# Patient Record
Sex: Female | Born: 1935 | Race: White | Hispanic: No | Marital: Married | Smoking: Never smoker
Health system: Southern US, Community
[De-identification: ages and names within clinical notes are randomized; demographics above are authoritative.]

## PROBLEM LIST (undated history)

## (undated) ENCOUNTER — Emergency Department: Admission: EM | Source: Home / Self Care

## (undated) DIAGNOSIS — H9190 Unspecified hearing loss, unspecified ear: Secondary | ICD-10-CM

## (undated) DIAGNOSIS — C419 Malignant neoplasm of bone and articular cartilage, unspecified: Secondary | ICD-10-CM

## (undated) DIAGNOSIS — Z8719 Personal history of other diseases of the digestive system: Secondary | ICD-10-CM

## (undated) DIAGNOSIS — M1990 Unspecified osteoarthritis, unspecified site: Secondary | ICD-10-CM

## (undated) DIAGNOSIS — I639 Cerebral infarction, unspecified: Secondary | ICD-10-CM

## (undated) DIAGNOSIS — R52 Pain, unspecified: Secondary | ICD-10-CM

## (undated) DIAGNOSIS — I82402 Acute embolism and thrombosis of unspecified deep veins of left lower extremity: Secondary | ICD-10-CM

## (undated) DIAGNOSIS — N189 Chronic kidney disease, unspecified: Secondary | ICD-10-CM

## (undated) DIAGNOSIS — Z8619 Personal history of other infectious and parasitic diseases: Secondary | ICD-10-CM

## (undated) DIAGNOSIS — K259 Gastric ulcer, unspecified as acute or chronic, without hemorrhage or perforation: Secondary | ICD-10-CM

## (undated) DIAGNOSIS — Z860100 Personal history of colon polyps, unspecified: Secondary | ICD-10-CM

## (undated) DIAGNOSIS — I898 Other specified noninfective disorders of lymphatic vessels and lymph nodes: Secondary | ICD-10-CM

## (undated) DIAGNOSIS — Z923 Personal history of irradiation: Secondary | ICD-10-CM

## (undated) DIAGNOSIS — R4701 Aphasia: Secondary | ICD-10-CM

## (undated) DIAGNOSIS — C541 Malignant neoplasm of endometrium: Secondary | ICD-10-CM

## (undated) DIAGNOSIS — R066 Hiccough: Secondary | ICD-10-CM

## (undated) DIAGNOSIS — I2699 Other pulmonary embolism without acute cor pulmonale: Secondary | ICD-10-CM

## (undated) DIAGNOSIS — C50919 Malignant neoplasm of unspecified site of unspecified female breast: Secondary | ICD-10-CM

## (undated) DIAGNOSIS — Z86718 Personal history of other venous thrombosis and embolism: Secondary | ICD-10-CM

## (undated) DIAGNOSIS — K219 Gastro-esophageal reflux disease without esophagitis: Secondary | ICD-10-CM

## (undated) HISTORY — DX: Unspecified hearing loss, unspecified ear: H91.90

## (undated) HISTORY — DX: Personal history of colon polyps, unspecified: Z86.0100

## (undated) HISTORY — DX: Personal history of other infectious and parasitic diseases: Z86.19

## (undated) HISTORY — PX: EYE SURGERY: SHX253

## (undated) HISTORY — PX: CATARACT EXTRACTION W/ INTRAOCULAR LENS IMPLANT: SHX1309

## (undated) HISTORY — DX: Other specified noninfective disorders of lymphatic vessels and lymph nodes: I89.8

## (undated) HISTORY — PX: TOTAL ABDOMINAL HYSTERECTOMY W/ BILATERAL SALPINGOOPHORECTOMY: SHX83

## (undated) HISTORY — PX: THYROIDECTOMY, PARTIAL: SHX18

## (undated) HISTORY — DX: Malignant neoplasm of endometrium: C54.1

## (undated) HISTORY — DX: Personal history of irradiation: Z92.3

## (undated) HISTORY — PX: ESOPHAGEAL DILATION: SHX303

## (undated) HISTORY — DX: Personal history of other venous thrombosis and embolism: Z86.718

## (undated) HISTORY — PX: OTHER SURGICAL HISTORY: SHX169

## (undated) HISTORY — DX: Personal history of colonic polyps: Z86.010

## (undated) HISTORY — DX: Aphasia: R47.01

## (undated) HISTORY — PX: BACK SURGERY: SHX140

## (undated) HISTORY — PX: TONSILLECTOMY: SUR1361

---

## 2004-05-24 ENCOUNTER — Ambulatory Visit: Payer: Self-pay | Admitting: Internal Medicine

## 2004-06-09 ENCOUNTER — Ambulatory Visit: Payer: Self-pay | Admitting: Gastroenterology

## 2004-06-15 ENCOUNTER — Ambulatory Visit: Payer: Self-pay | Admitting: Gastroenterology

## 2005-03-06 ENCOUNTER — Ambulatory Visit: Payer: Self-pay | Admitting: Internal Medicine

## 2005-05-29 ENCOUNTER — Ambulatory Visit: Payer: Self-pay | Admitting: Internal Medicine

## 2006-05-31 ENCOUNTER — Ambulatory Visit: Payer: Self-pay | Admitting: Internal Medicine

## 2007-05-06 ENCOUNTER — Emergency Department: Payer: Self-pay | Admitting: Emergency Medicine

## 2007-06-04 ENCOUNTER — Ambulatory Visit: Payer: Self-pay | Admitting: Internal Medicine

## 2008-04-09 ENCOUNTER — Ambulatory Visit: Payer: Self-pay | Admitting: Internal Medicine

## 2008-06-08 ENCOUNTER — Ambulatory Visit: Payer: Self-pay | Admitting: Internal Medicine

## 2008-06-29 ENCOUNTER — Ambulatory Visit: Payer: Self-pay | Admitting: Gastroenterology

## 2008-09-14 ENCOUNTER — Ambulatory Visit: Payer: Self-pay | Admitting: Gastroenterology

## 2008-12-13 ENCOUNTER — Emergency Department: Payer: Self-pay | Admitting: Unknown Physician Specialty

## 2009-04-27 ENCOUNTER — Ambulatory Visit: Payer: Self-pay | Admitting: Ophthalmology

## 2009-07-13 ENCOUNTER — Ambulatory Visit: Payer: Self-pay | Admitting: Internal Medicine

## 2009-10-26 ENCOUNTER — Ambulatory Visit: Payer: Self-pay | Admitting: Internal Medicine

## 2009-11-01 ENCOUNTER — Ambulatory Visit: Payer: Self-pay | Admitting: Internal Medicine

## 2010-06-15 ENCOUNTER — Ambulatory Visit: Payer: Self-pay | Admitting: Internal Medicine

## 2010-07-27 ENCOUNTER — Ambulatory Visit: Payer: Self-pay | Admitting: Internal Medicine

## 2010-09-18 ENCOUNTER — Emergency Department: Payer: Self-pay | Admitting: Unknown Physician Specialty

## 2011-04-06 ENCOUNTER — Inpatient Hospital Stay (HOSPITAL_COMMUNITY)
Admission: EM | Admit: 2011-04-06 | Discharge: 2011-04-08 | DRG: 066 | Disposition: A | Discharge status: Home Health | Payer: Medicare Other | Source: Ambulatory Visit | Attending: Family Medicine | Admitting: Family Medicine

## 2011-04-06 ENCOUNTER — Encounter (HOSPITAL_COMMUNITY): Payer: Self-pay | Admitting: *Deleted

## 2011-04-06 ENCOUNTER — Emergency Department (HOSPITAL_COMMUNITY): Payer: Medicare Other

## 2011-04-06 DIAGNOSIS — I639 Cerebral infarction, unspecified: Secondary | ICD-10-CM

## 2011-04-06 DIAGNOSIS — K219 Gastro-esophageal reflux disease without esophagitis: Secondary | ICD-10-CM | POA: Diagnosis present

## 2011-04-06 DIAGNOSIS — Z880 Allergy status to penicillin: Secondary | ICD-10-CM

## 2011-04-06 DIAGNOSIS — R911 Solitary pulmonary nodule: Secondary | ICD-10-CM | POA: Diagnosis present

## 2011-04-06 DIAGNOSIS — R4701 Aphasia: Secondary | ICD-10-CM | POA: Diagnosis present

## 2011-04-06 DIAGNOSIS — Z9849 Cataract extraction status, unspecified eye: Secondary | ICD-10-CM

## 2011-04-06 DIAGNOSIS — Z79899 Other long term (current) drug therapy: Secondary | ICD-10-CM

## 2011-04-06 DIAGNOSIS — Z7902 Long term (current) use of antithrombotics/antiplatelets: Secondary | ICD-10-CM

## 2011-04-06 DIAGNOSIS — M129 Arthropathy, unspecified: Secondary | ICD-10-CM | POA: Diagnosis present

## 2011-04-06 DIAGNOSIS — I6350 Cerebral infarction due to unspecified occlusion or stenosis of unspecified cerebral artery: Principal | ICD-10-CM | POA: Diagnosis present

## 2011-04-06 DIAGNOSIS — I498 Other specified cardiac arrhythmias: Secondary | ICD-10-CM | POA: Diagnosis present

## 2011-04-06 HISTORY — DX: Cerebral infarction, unspecified: I63.9

## 2011-04-06 HISTORY — DX: Gastro-esophageal reflux disease without esophagitis: K21.9

## 2011-04-06 HISTORY — DX: Personal history of other diseases of the digestive system: Z87.19

## 2011-04-06 HISTORY — DX: Unspecified osteoarthritis, unspecified site: M19.90

## 2011-04-06 LAB — PROTIME-INR
INR: 0.93 (ref 0.00–1.49)
Prothrombin Time: 12.7 s (ref 11.6–15.2)

## 2011-04-06 LAB — DIFFERENTIAL
Basophils Absolute: 0.1 10*3/uL (ref 0.0–0.1)
Basophils Relative: 1 % (ref 0–1)
Eosinophils Absolute: 0.2 10*3/uL (ref 0.0–0.7)
Eosinophils Relative: 3 % (ref 0–5)
Lymphocytes Relative: 42 % (ref 12–46)
Lymphs Abs: 2.8 K/uL (ref 0.7–4.0)
Monocytes Absolute: 0.4 10*3/uL (ref 0.1–1.0)
Monocytes Relative: 7 % (ref 3–12)
Neutro Abs: 3.2 10*3/uL (ref 1.7–7.7)
Neutrophils Relative %: 47 % (ref 43–77)

## 2011-04-06 LAB — COMPREHENSIVE METABOLIC PANEL WITH GFR
ALT: 11 U/L (ref 0–35)
Alkaline Phosphatase: 54 U/L (ref 39–117)
CO2: 25 meq/L (ref 19–32)
Chloride: 105 meq/L (ref 96–112)
GFR calc Af Amer: >90 mL/min (ref >90)
GFR calc non Af Amer: 78 mL/min — ABNORMAL LOW (ref >90)
Glucose, Bld: 105 mg/dL — ABNORMAL HIGH (ref 70–99)
Potassium: 4 meq/L (ref 3.5–5.1)
Sodium: 141 meq/L (ref 135–145)
Total Bilirubin: 0.6 mg/dL (ref 0.3–1.2)

## 2011-04-06 LAB — CBC
HCT: 42.2 % (ref 36.0–46.0)
Hemoglobin: 14.1 g/dL (ref 12.0–15.0)
MCH: 30.3 pg (ref 26.0–34.0)
MCHC: 33.4 g/dL (ref 30.0–36.0)
MCV: 90.8 fL (ref 78.0–100.0)
Platelets: 217 K/uL (ref 150–400)
RBC: 4.65 MIL/uL (ref 3.87–5.11)
RDW: 12.9 % (ref 11.5–15.5)
WBC: 6.7 K/uL (ref 4.0–10.5)

## 2011-04-06 LAB — CK TOTAL AND CKMB (NOT AT ARMC)
CK, MB: 2.2 ng/mL (ref 0.3–4.0)
Relative Index: INVALID (ref 0.0–2.5)
Total CK: 59 U/L (ref 7–177)

## 2011-04-06 LAB — COMPREHENSIVE METABOLIC PANEL
AST: 17 U/L (ref 0–37)
Albumin: 3.7 g/dL (ref 3.5–5.2)
BUN: 16 mg/dL (ref 6–23)
Calcium: 10.2 mg/dL (ref 8.4–10.5)
Creatinine, Ser: 0.77 mg/dL (ref 0.50–1.10)
Total Protein: 6.8 g/dL (ref 6.0–8.3)

## 2011-04-06 LAB — POCT I-STAT, CHEM 8
Glucose, Bld: 106 mg/dL — ABNORMAL HIGH (ref 70–99)
HCT: 44 % (ref 36.0–46.0)
Hemoglobin: 15 g/dL (ref 12.0–15.0)
Potassium: 4 mEq/L (ref 3.5–5.1)
Sodium: 142 mEq/L (ref 135–145)
TCO2: 26 mmol/L (ref 0–100)

## 2011-04-06 LAB — TROPONIN I: Troponin I: <0.3 ng/mL (ref <0.30)

## 2011-04-06 LAB — APTT: aPTT: 28 seconds (ref 24–37)

## 2011-04-06 MED ORDER — SENNOSIDES-DOCUSATE SODIUM 8.6-50 MG PO TABS: 1.0000 | ORAL_TABLET | Freq: Every evening | ORAL | Status: DC | PRN

## 2011-04-06 MED ORDER — CLOPIDOGREL BISULFATE 300 MG PO TABS
300.0000 mg | ORAL_TABLET | Freq: Once | ORAL | Status: AC
  Administered 2011-04-06: 300 mg via ORAL
  Filled 2011-04-06: qty 1

## 2011-04-06 MED ORDER — ENOXAPARIN SODIUM 30 MG/0.3ML ~~LOC~~ SOLN
30.0000 mg | SUBCUTANEOUS | Status: DC
  Administered 2011-04-07 – 2011-04-08 (×2): 30 mg via SUBCUTANEOUS
  Filled 2011-04-06 (×2): qty 0.3

## 2011-04-06 MED ORDER — SODIUM CHLORIDE 0.9 % IV SOLN
INTRAVENOUS | Status: DC
  Administered 2011-04-07 – 2011-04-08 (×3): via INTRAVENOUS

## 2011-04-06 MED ORDER — CLOPIDOGREL BISULFATE 75 MG PO TABS
75.0000 mg | ORAL_TABLET | Freq: Every day | ORAL | Status: DC
  Administered 2011-04-07 – 2011-04-08 (×2): 75 mg via ORAL
  Filled 2011-04-06 (×3): qty 1

## 2011-04-06 NOTE — Significant Event (Signed)
Rapid Response Event Note - see progress note. Pt.'s symptoms were verbal deficits. Further exploration of the events of the day points to onset of symptoms at 1430. Seen by family w/ symptoms at 1900. Head CT done and most symptoms, except continued aphasia, resolved. No TPA as per Dr. Melven Sartorius. POC dicussed with pt. And family  Overview: Time Called: 2130 (EMS about out) Arrival Time: 2140 Event Type: Neurologic  Initial Focused Assessment: 2145 - NIHSS - 6 and at 2230 - NIHSS - 1   Interventions: IV, O2, labs, EKG, head CT, CBG = 90   Event Summary: Name of Physician Notified: Dr. Fayrene Fearing, ER physician. at    Name of Consulting Physician Notified: Dr. Melven Sartorius, neuro at    Outcome: Other (Comment) (admitted and to be transferred to neuro floor service)  Event End Time: 2235  Mallie Darting

## 2011-04-06 NOTE — ED Notes (Signed)
CBG:90 

## 2011-04-06 NOTE — Progress Notes (Addendum)
2145 - Code  Stroke - Pt. Traci [name], [place] symptoms consistant with mild aphasia and dysarthria. No facial droop or extemity weakness. Left gaze initially not appropriate but resolved over time as did the dysarthria. According to family LSN was 1900, but pt. Consistently states that she was experiencing double vision and "not feeling right" starting earlier in the day at 1430, when she was alone. Pt. Showed improvement over the hour but still has some mild aphasia. Dr. Nila Nephew discussed POC to pt. And family at the bedside. Original NIHSS - 6.  And later at 2230 NIHSS - 1

## 2011-04-06 NOTE — Significant Event (Signed)
Rapid Response Event Note  Overview: Time Called: 2130 (EMS about out) Arrival Time: 2140 Event Type: Neurologic  Initial Focused Assessment:   Interventions:   Event Summary: Name of Physician Notified: Dr. Fayrene Fearing, ER physician. at    Name of Consulting Physician Notified: Dr. Melven Sartorius, neuro at    Outcome: Other (Comment) (admitted and to be transferred to neuro floor service)  Event End Time: 2235  Mallie Darting

## 2011-04-06 NOTE — ED Notes (Signed)
GAVE EKG TO DR Laymond Purser. NO OLD

## 2011-04-06 NOTE — ED Notes (Signed)
Family members at bedside now and speaking with neurologist

## 2011-04-06 NOTE — Consult Note (Signed)
Referring Physician: Laymond Purser   Chief Complaint: Stroke  HPI: Traci [name] is an 78 y.o. female white presenting in the ER with speech disturbance. The patient tells me around 2:30 today she had an episode of double vision. She cannot tell me how long this lasted. The patient then watched TV and later on did not feel right. Her daughter called around 6 in the evening and noticed that her mother was not making sense on the phone. Her daughter-in-law came over and checked on her and at that time it was decided to call EMS. The patient cannot tell me if she felt completely okay after the double vision. It is not clear if  at any point between 2:30 and arrival to the ER there was any return to her baseline/normal. She does admit to not feeling right after the double vision but is not able to describe what she means.. She currently does not see double. She denies any problems with swallowing. The patient denies any dizziness/vertigo/lightheadedness. She does not have any numbness or tingling anywhere nor does she have any weakness. She believes she took her medication which includes a Bayer aspirin aspirin around 12pm today.   LSN: 2:30pm 04/06/2011  tPA Given: No: Patient past time window for IV TPA treatment, possibly acute changes on CT scan  mRankin:  History reviewed. No pertinent past medical history.  History reviewed. No pertinent past surgical history.  History reviewed. No pertinent family history. Social History:  does not have a smoking history on file. She does not have any smokeless tobacco history on file. Her alcohol and drug histories not on file.  Allergies:  Allergies  Allergen Reactions  . Penicillins     Medications: I have reviewed the patient's current medications.  ROS: The patient denies chest pain, palpitations, diaphoresis. She denies shortness of breath, cough. The patient denies abdominal pain, diarrhea, nausea or vomiting. She does not have any muscle aches. She  denies blood in her bowel movements or urine. The patient does not have any fever, chills or pain on urination  Physical Examination: Blood pressure 131/59, pulse 73, resp. rate 20, SpO2 99.00%. General: Patient is alert and oriented x3. She does require a few times to answer the time but is accurate. She's not appear in any distress. Cardiovascular: Heart tones S1 and S2 are heard, no S3/S4/carotid bruits. Lungs: Clear to auscultation bilaterally in the front fields Abdomen: Soft Neurologic Examination:  Naming/repetition/comprehension intact and speech is fluent. The patient does use some paraphasias throughout history taking and exam although this is minimal. Pupils are equal round and reactive to light, extraocular movements are intact, no visual field deficits. V1 through V3 intact to soft touch and pinprick. No facial droop. Hearing grossly intact. Pharynx arch symmetric in stand and elevation. SCM/trapezius 5/5. Tongue midline with normal movements. Motor: 5/5, no pronator drift, normal tone Sensory: Intact to soft touch and pinprick all 4 limbs Coordination: Finger-nose-finger and heel-knee-shin within normal limits reflexes: Symmetric NIHSS at beginning of exam 6, now 0   Is in a Results for orders placed during the hospital encounter of 04/06/11 (from the past 48 hour(s))  PROTIME-INR     Status: Normal   Collection Time   04/06/11  9:50 PM      Component Value Range Comment   Prothrombin Time 12.7  11.6 - 15.2 (seconds)    INR 0.93  0.00 - 1.49    APTT     Status: Normal   Collection Time   04/06/11  9:50 PM      Component Value Range Comment   aPTT 28  24 - 37 (seconds)   CBC     Status: Normal   Collection Time   04/06/11  9:50 PM      Component Value Range Comment   WBC 6.7  4.0 - 10.5 (K/uL)    RBC 4.65  3.87 - 5.11 (MIL/uL)    Hemoglobin 14.1  12.0 - 15.0 (g/dL)    HCT 16.1  09.6 - 04.5 (%)    MCV 90.8  78.0 - 100.0 (fL)    MCH 30.3  26.0 - 34.0 (pg)    MCHC 33.4   30.0 - 36.0 (g/dL)    RDW 40.9  81.1 - 91.4 (%)    Platelets 217  150 - 400 (K/uL)   DIFFERENTIAL     Status: Normal   Collection Time   04/06/11  9:50 PM      Component Value Range Comment   Neutrophils Relative 47  43 - 77 (%)    Neutro Abs 3.2  1.7 - 7.7 (K/uL)    Lymphocytes Relative 42  12 - 46 (%)    Lymphs Abs 2.8  0.7 - 4.0 (K/uL)    Monocytes Relative 7  3 - 12 (%)    Monocytes Absolute 0.4  0.1 - 1.0 (K/uL)    Eosinophils Relative 3  0 - 5 (%)    Eosinophils Absolute 0.2  0.0 - 0.7 (K/uL)    Basophils Relative 1  0 - 1 (%)    Basophils Absolute 0.1  0.0 - 0.1 (K/uL)   COMPREHENSIVE METABOLIC PANEL     Status: Abnormal   Collection Time   04/06/11  9:50 PM      Component Value Range Comment   Sodium 141  135 - 145 (mEq/L)    Potassium 4.0  3.5 - 5.1 (mEq/L)    Chloride 105  96 - 112 (mEq/L)    CO2 25  19 - 32 (mEq/L)    Glucose, Bld 105 (*) 70 - 99 (mg/dL)    BUN 16  6 - 23 (mg/dL)    Creatinine, Ser 7.82  0.50 - 1.10 (mg/dL)    Calcium 95.6  8.4 - 10.5 (mg/dL)    Total Protein 6.8  6.0 - 8.3 (g/dL)    Albumin 3.7  3.5 - 5.2 (g/dL)    AST 17  0 - 37 (U/L)    ALT 11  0 - 35 (U/L)    Alkaline Phosphatase 54  39 - 117 (U/L)    Total Bilirubin 0.6  0.3 - 1.2 (mg/dL)    GFR calc non Af Amer 78 (*) >90 (mL/min)    GFR calc Af Amer >90  >90 (mL/min)   CK TOTAL AND CKMB     Status: Normal   Collection Time   04/06/11  9:50 PM      Component Value Range Comment   Total CK 59  7 - 177 (U/L)    CK, MB 2.2  0.3 - 4.0 (ng/mL)    Relative Index RELATIVE INDEX IS INVALID  0.0 - 2.5    TROPONIN I     Status: Normal   Collection Time   04/06/11  9:50 PM      Component Value Range Comment   Troponin I <0.30  <0.30 (ng/mL)   POCT I-STAT, CHEM 8     Status: Abnormal   Collection Time   04/06/11  9:51 PM  Component Value Range Comment   Sodium 142  135 - 145 (mEq/L)    Potassium 4.0  3.5 - 5.1 (mEq/L)    Chloride 106  96 - 112 (mEq/L)    BUN 16  6 - 23 (mg/dL)     Creatinine, Ser 1.47  0.50 - 1.10 (mg/dL)    Glucose, Bld 829 (*) 70 - 99 (mg/dL)    Calcium, Ion 5.62  1.12 - 1.32 (mmol/L)    TCO2 26  0 - 100 (mmol/L)    Hemoglobin 15.0  12.0 - 15.0 (g/dL)    HCT 13.0  86.5 - 78.4 (%)    Ct Head Wo Contrast  04/06/2011  *RADIOLOGY REPORT*  Clinical Data: Code stroke; difficulty speaking.  CT HEAD WITHOUT CONTRAST  Technique:  Contiguous axial images were obtained from the base of the skull through the vertex without contrast.  Comparison: None.  Findings:   There are vague areas of decreased attenuation within the left thalamus and along the right external capsule; these reflect infarcts of indeterminate age.  If these are acute, these likely reflect an embolic event.  There is no evidence of hemorrhagic transformation.  No intra or extra-axial hemorrhage is seen.  No mass lesions are identified. Calcification is seen within the basal ganglia bilaterally.  Mild cerebellar atrophy is noted.  The brainstem and fourth ventricle are within normal limits.  The third and lateral ventricles are unremarkable in appearance.  The cerebral hemispheres are symmetric in appearance, with normal gray- white differentiation.  No mass effect or midline shift is seen.  There is no evidence of fracture; visualized osseous structures are unremarkable in appearance.  The visualized portions of the orbits are within normal limits.  The paranasal sinuses and mastoid air cells are well-aerated.  No significant soft tissue abnormalities are seen.  IMPRESSION: Small vague areas of decreased attenuation within the left thalamus and along the right external capsule; these reflect infarcts of indeterminate age.  If these are acute, these likely reflect an embolic event.  No evidence of hemorrhagic transformation at this time.  These results were called by telephone on 04/06/2011  at  09:57 p.m. to  Dr. Quita Skye, who verbally acknowledged these results.  Original Report Authenticated By: Tonia Ghent, M.D.    Assessment: 78 y.o. female white female presenting in the ER with speech disturbance/encephalopathy. Physical exam shows use of paraphasias with no other signs aphasia. CT scan shows hypodensities left thalamus which could be acute. Considering patient's symptoms started outside TPA time frame and her NIH SS score was 0, TPA was not offered. Furthermore, patient's symptoms were improving rapidly in the ER and she has no risk factors for stroke. This was explained to the patient and family who understood and agreed with plan   Stroke Risk Factors - none  Plan: 1. HgbA1c, fasting lipid panel 2. MRI, MRA  of the brain without contrast 3. PT consult, OT consult, Speech consult 4. Echocardiogram 5. Carotid dopplers 6. Prophylactic therapy-Antiplatelet med: Plavix - dose 75mg  7. Risk factor modification 8. Telemetry monitoring   Evani Shrider Christophe Louis, MD [facility]  04/06/2011, 10:45 PM

## 2011-04-06 NOTE — ED Provider Notes (Signed)
I saw and evaluated the patient, reviewed the resident's note and I agree with the findings and plan.  Pt was brought by Traci [name] due to suspected code stroke, onset of symptoms at 7 PM.  Pt had blurred vision this afternoon, resolved.  She then developed difficulty concentrating, confused.  Speech difficulty as well.  Denies any sig PMH.  Pt is a non smoker.  Code stroke initiated upon arrival.  I discussed with radiologist and reviewed the pt's head CT.  There are small age indetermined small capsular and thalamic infarcts per Dr. Cherly Hensen.  Neuro is at bedside along with code stroke team.  Seems to be some question on exact time of onset and symptoms are mild, possibly improving.  Pt seems to be able to speak more clearly, but still seems somewhat confused.  Will need admission.  Pt is not hypertensive.    EKG at time 22:12, shows sinus rhythm at a rate of 72. Normal axis. Slight poor R-wave progression noted from V1 through V3. Otherwise normal intervals. No ST or T wave abnormalities. No prior EKGs are available.   The patient's primary care physician is Dr. Ardell Isaacs with the [facility] clinic in [place] and will be admitted to the unassigned medical team.   [name].     [name]. [name], MD 04/06/11 2237

## 2011-04-06 NOTE — ED Notes (Signed)
Per EMS: states pt began having difficulty speaking at 1900 this evening.  Lives alone.

## 2011-04-06 NOTE — ED Notes (Signed)
Pt states she went to the bathroom at 2:30 pm today and sat down and was "seeing double" and thought "something was wrong".  Realizes she is having difficulty saying what she is trying to stay.  Strength equal in all extremities.  No facial droop or slurred speech.  Unable to answer some orientation questions.  Answers year incorrectly but able to states date.  Cannot state what season it is.  Knows how old she is.

## 2011-04-06 NOTE — ED Notes (Signed)
Chem 8 results shown to Dr. [name] by B. Bing Plume, EMT

## 2011-04-06 NOTE — ED Notes (Signed)
Neurologist at bedside to assess pt.

## 2011-04-07 ENCOUNTER — Inpatient Hospital Stay (HOSPITAL_COMMUNITY): Payer: Medicare Other

## 2011-04-07 ENCOUNTER — Encounter (HOSPITAL_COMMUNITY): Payer: Self-pay | Admitting: Internal Medicine

## 2011-04-07 DIAGNOSIS — I639 Cerebral infarction, unspecified: Secondary | ICD-10-CM

## 2011-04-07 DIAGNOSIS — I6350 Cerebral infarction due to unspecified occlusion or stenosis of unspecified cerebral artery: Secondary | ICD-10-CM

## 2011-04-07 DIAGNOSIS — I6789 Other cerebrovascular disease: Secondary | ICD-10-CM

## 2011-04-07 LAB — CREATININE, SERUM
Creatinine, Ser: 0.73 mg/dL (ref 0.50–1.10)
GFR calc Af Amer: >90 mL/min (ref >90)

## 2011-04-07 LAB — CBC
Hemoglobin: 15.2 g/dL — ABNORMAL HIGH (ref 12.0–15.0)
MCH: 30.8 pg (ref 26.0–34.0)
MCH: 31.1 pg (ref 26.0–34.0)
MCV: 90.5 fL (ref 78.0–100.0)
MCV: 91.2 fL (ref 78.0–100.0)
Platelets: 222 10*3/uL (ref 150–400)
Platelets: 223 10*3/uL (ref 150–400)
RBC: 4.56 MIL/uL (ref 3.87–5.11)
RBC: 4.93 MIL/uL (ref 3.87–5.11)
RDW: 13 % (ref 11.5–15.5)
WBC: 7.3 10*3/uL (ref 4.0–10.5)

## 2011-04-07 LAB — HEMOGLOBIN A1C: Hgb A1c MFr Bld: 5.6 % (ref <5.7)

## 2011-04-07 LAB — GLUCOSE, CAPILLARY: Glucose-Capillary: 90 mg/dL (ref 70–99)

## 2011-04-07 LAB — LIPID PANEL
LDL Cholesterol: 68 mg/dL (ref 0–99)
VLDL: 20 mg/dL (ref 0–40)

## 2011-04-07 MED ORDER — SENNOSIDES-DOCUSATE SODIUM 8.6-50 MG PO TABS: 1.0000 | ORAL_TABLET | Freq: Every evening | ORAL | Status: DC | PRN

## 2011-04-07 MED ORDER — ENOXAPARIN SODIUM 40 MG/0.4ML ~~LOC~~ SOLN
40.0000 mg | SUBCUTANEOUS | Status: DC
  Administered 2011-04-08: 40 mg via SUBCUTANEOUS
  Filled 2011-04-07: qty 0.4

## 2011-04-07 NOTE — Progress Notes (Signed)
  Echocardiogram 2D Echocardiogram has been performed.  Traci [name] [facility] 04/07/2011, 10:41 AM

## 2011-04-07 NOTE — Progress Notes (Signed)
Utilization Review Completed.  Lolly Glaus T  04/07/2011  

## 2011-04-07 NOTE — Discharge Instructions (Signed)
STROKE/TIA DISCHARGE INSTRUCTIONS SMOKING Cigarette smoking nearly doubles your risk of having a stroke & is the single most alterable risk factor  If you smoke or have smoked in the last 12 months, you are advised to quit smoking for your health.  Most of the excess cardiovascular risk related to smoking disappears within a year of stopping.  Ask you doctor about anti-smoking medications  [place] Quit Line: 1-800-QUIT NOW  Free Smoking Cessation Classes [phone]  CHOLESTEROL Know your levels; limit fat & cholesterol in your diet  Lipid Panel     Component Value Date/Time   CHOL 143 04/07/2011 0530   TRIG 99 04/07/2011 0530   HDL 55 04/07/2011 0530   CHOLHDL 2.6 04/07/2011 0530   VLDL 20 04/07/2011 0530   LDLCALC 68 04/07/2011 0530      Many patients benefit from treatment even if their cholesterol is at goal.  Goal: Total Cholesterol (CHOL) less than 160  Goal:  Triglycerides (TRIG) less than 150  Goal:  HDL greater than 40  Goal:  LDL (LDLCALC) less than 100   BLOOD PRESSURE American Stroke Association blood pressure target is less that 120/80 mm/Hg  Your discharge blood pressure is:  BP: 121/72 mmHg  Monitor your blood pressure  Limit your salt and alcohol intake  Many individuals will require more than one medication for high blood pressure  DIABETES (A1c is a blood sugar average for last 3 months) Goal HGBA1c is under 7% (HBGA1c is blood sugar average for last 3 months)  Diabetes: {STROKE DC DIABETES:22357}    No results found for this basename: HGBA1C     Your HGBA1c can be lowered with medications, healthy diet, and exercise.  Check your blood sugar as directed by your physician  Call your physician if you experience unexplained or low blood sugars.  PHYSICAL ACTIVITY/REHABILITATION Goal is 30 minutes at least 4 days per week    {STROKE DC ACTIVITY/REHAB:22359}  Activity decreases your risk of heart attack and stroke and makes your heart stronger.  It helps control  your weight and blood pressure; helps you relax and can improve your mood.  Participate in a regular exercise program.  Talk with your doctor about the best form of exercise for you (dancing, walking, swimming, cycling).  DIET/WEIGHT Goal is to maintain a healthy weight  Your discharge diet is: Cardiac *** liquids Your height is:    Your current weight is:   Your Body Mass Index (BMI) is:     Following the type of diet specifically designed for you will help prevent another stroke.  Your goal weight range is:  ***  Your goal Body Mass Index (BMI) is 19-24.  Healthy food habits can help reduce 3 risk factors for stroke:  High cholesterol, hypertension, and excess weight.  RESOURCES Stroke/Support Group:  Call [phone]  they meet the 3rd Sunday of the month on the Rehab Unit at [facility], [place] ( no meetings June, July & Aug).  STROKE EDUCATION PROVIDED/REVIEWED AND GIVEN TO PATIENT Stroke warning signs and symptoms How to activate emergency medical system (call 911). Medications prescribed at discharge. Need for follow-up after discharge. Personal risk factors for stroke. Pneumonia vaccine given:   {STROKE DC YES/NO/DATE:22363} Flu vaccine given:   {STROKE DC YES/NO/DATE:22363} My questions have been answered, the writing is legible, and I understand these instructions.  I will adhere to these goals & educational materials that have been provided to me after my discharge from the hospital.

## 2011-04-07 NOTE — Evaluation (Signed)
Speech Language Pathology Evaluation Patient Details Name: Traci [name] MRN: [id number] DOB: [birth year] Today's Date: 04/07/2011  Problem List:  Patient Active Problem List  Diagnoses  . Stroke   Past Medical History: History reviewed. No pertinent past medical history. Past Surgical History: History reviewed. No pertinent past surgical history.  SLP Assessment/Plan/Recommendation Assessment Clinical Impression Statement: 78 y.o. female with a left thalamic stroke, secondary to small vessel disease on 04/06/11 who presents with a classic presentation of a thalamic aphasia; marked by fairly intact auditory comprehension (likely some high level deficits with abstract, complex information), intact repetition and fluent, however bizarre paraphasias and low volume.  Educated patient and family on this type of aphasia and concern for her overall safety at home, as she lives alone.  Recommend PT/OT to assess functional saftey (verbal abilities will look more impaired) for her to be by herself for a few hours per day or will she need 24hr supervision.  Recommend outpatient SLP f/u, although this type of aphasia tends to resolve quickly.  SLP Recommendation/Assessment: All further Speech Lanaguage Pathology  needs can be addressed in the next venue of care (Outpatient) SLP Recommendations Recommendations for Other Services: OT consult;PT consult Follow up Recommendations: Outpatient SLP Equipment Recommended: None recommended by SLP Individuals Consulted Consulted and Agree with Results and Recommendations: Patient;Family member/caregiver  SLP Evaluation Comprehension Auditory Comprehension Overall Auditory Comprehension: Appears within functional limits for tasks assessed Yes/No Questions: Within Functional Limits Commands: Within Functional Limits Conversation: Complex Interfering Components: Processing speed EffectiveTechniques: Repetition;Slowed speech Visual  Recognition/Discrimination Discrimination: Within Function Limits Reading Comprehension Reading Status: Within funtional limits  Expression Expression Primary Mode of Expression: Verbal Verbal Expression Overall Verbal Expression: Impaired Initiation: No impairment Level of Generative/Spontaneous Verbalization: Conversation Repetition: No impairment Naming: Impairment Responsive: 76-100% accurate Confrontation: Within functional limits Convergent: 75-100% accurate Verbal Errors: Phonemic paraphasias;Semantic paraphasias;Neologisms;Perseveration;Not aware of errors Pragmatics: No impairment Effective Techniques: Semantic cues;Open ended questions Written Expression Dominant Hand: Right Written Expression: Exceptions to [facility] Self Formulation Ability: Word;Phrase;Sentence  Myra Rude, M.S.,CCC-SLP Pager [phone] 04/07/2011, 3:41 PM

## 2011-04-07 NOTE — H&P (Signed)
Traci [name] is an 78 y.o. female.   PCP - [facility]. Chief Complaint: Diplopia and confusion. HPI: 78 year old female with no significant past medical history was brought in the ER the patient's daughter found her to be confused while talking on the phone. Patient states that she had some double vision on 2:30 PM while at home which slowly improved. At around 6 PM last evening when her daughter called she looked confused and had some slurred speech. Her daughter called her brother and eventually EMS was called and patient was brought to the ER. In the ER patient is CT head which showed abnormal findings in the left thalamus and right external capsule. Neurology was consulted hand as per neurology patient was not a candidate for TPA being outside the window period and also her symptoms are improved. At this time and I examined patient is not complaining of any diplopia and appears still mildly confused. Denies any chest pain shortness of breath nausea vomiting abdominal pain palpitations dizziness or any loss of function of upper or lower extremities. Denies any headache or any difficulty walking.  History reviewed. No pertinent past medical history.  History reviewed. No pertinent past surgical history.  Family History  Problem Relation Age of Onset  . Stroke Mother    Social History:  reports that she has never smoked. She does not have any smokeless tobacco history on file. She reports that she does not drink alcohol or use illicit drugs.  Allergies:  Allergies  Allergen Reactions  . Penicillins     Medications Prior to Admission  Medication Dose Route Frequency Provider Last Rate Last Dose  . 0.9 %  sodium chloride infusion   Intravenous Continuous [name]. Ghim, MD 100 mL/hr at 04/07/11 0147    . clopidogrel (PLAVIX) tablet 300 mg  300 mg Oral Once Carmelina Peal, MD   300 mg at 04/06/11 2309  . clopidogrel (PLAVIX) tablet 75 mg  75 mg Oral Q breakfast Rajani  Caesar, MD      . enoxaparin (LOVENOX) injection 30 mg  30 mg Subcutaneous Q24H Rajani Caesar, MD      . senna-docusate (Senokot-S) tablet 1 tablet  1 tablet Oral QHS PRN Carmelina Peal, MD       No current outpatient prescriptions on file as of 04/07/2011.    Results for orders placed during the hospital encounter of 04/06/11 (from the past 48 hour(s))  PROTIME-INR     Status: Normal   Collection Time   04/06/11  9:50 PM      Component Value Range Comment   Prothrombin Time 12.7  11.6 - 15.2 (seconds)    INR 0.93  0.00 - 1.49    APTT     Status: Normal   Collection Time   04/06/11  9:50 PM      Component Value Range Comment   aPTT 28  24 - 37 (seconds)   CBC     Status: Normal   Collection Time   04/06/11  9:50 PM      Component Value Range Comment   WBC 6.7  4.0 - 10.5 (K/uL)    RBC 4.65  3.87 - 5.11 (MIL/uL)    Hemoglobin 14.1  12.0 - 15.0 (g/dL)    HCT 09.6  04.5 - 40.9 (%)    MCV 90.8  78.0 - 100.0 (fL)    MCH 30.3  26.0 - 34.0 (pg)    MCHC 33.4  30.0 - 36.0 (g/dL)    RDW  12.9  11.5 - 15.5 (%)    Platelets 217  150 - 400 (K/uL)   DIFFERENTIAL     Status: Normal   Collection Time   04/06/11  9:50 PM      Component Value Range Comment   Neutrophils Relative 47  43 - 77 (%)    Neutro Abs 3.2  1.7 - 7.7 (K/uL)    Lymphocytes Relative 42  12 - 46 (%)    Lymphs Abs 2.8  0.7 - 4.0 (K/uL)    Monocytes Relative 7  3 - 12 (%)    Monocytes Absolute 0.4  0.1 - 1.0 (K/uL)    Eosinophils Relative 3  0 - 5 (%)    Eosinophils Absolute 0.2  0.0 - 0.7 (K/uL)    Basophils Relative 1  0 - 1 (%)    Basophils Absolute 0.1  0.0 - 0.1 (K/uL)   COMPREHENSIVE METABOLIC PANEL     Status: Abnormal   Collection Time   04/06/11  9:50 PM      Component Value Range Comment   Sodium 141  135 - 145 (mEq/L)    Potassium 4.0  3.5 - 5.1 (mEq/L)    Chloride 105  96 - 112 (mEq/L)    CO2 25  19 - 32 (mEq/L)    Glucose, Bld 105 (*) 70 - 99 (mg/dL)    BUN 16  6 - 23 (mg/dL)    Creatinine, Ser 1.61  0.50 -  1.10 (mg/dL)    Calcium 09.6  8.4 - 10.5 (mg/dL)    Total Protein 6.8  6.0 - 8.3 (g/dL)    Albumin 3.7  3.5 - 5.2 (g/dL)    AST 17  0 - 37 (U/L)    ALT 11  0 - 35 (U/L)    Alkaline Phosphatase 54  39 - 117 (U/L)    Total Bilirubin 0.6  0.3 - 1.2 (mg/dL)    GFR calc non Af Amer 78 (*) >90 (mL/min)    GFR calc Af Amer >90  >90 (mL/min)   CK TOTAL AND CKMB     Status: Normal   Collection Time   04/06/11  9:50 PM      Component Value Range Comment   Total CK 59  7 - 177 (U/L)    CK, MB 2.2  0.3 - 4.0 (ng/mL)    Relative Index RELATIVE INDEX IS INVALID  0.0 - 2.5    TROPONIN I     Status: Normal   Collection Time   04/06/11  9:50 PM      Component Value Range Comment   Troponin I <0.30  <0.30 (ng/mL)   POCT I-STAT, CHEM 8     Status: Abnormal   Collection Time   04/06/11  9:51 PM      Component Value Range Comment   Sodium 142  135 - 145 (mEq/L)    Potassium 4.0  3.5 - 5.1 (mEq/L)    Chloride 106  96 - 112 (mEq/L)    BUN 16  6 - 23 (mg/dL)    Creatinine, Ser 0.45  0.50 - 1.10 (mg/dL)    Glucose, Bld 409 (*) 70 - 99 (mg/dL)    Calcium, Ion 8.11  1.12 - 1.32 (mmol/L)    TCO2 26  0 - 100 (mmol/L)    Hemoglobin 15.0  12.0 - 15.0 (g/dL)    HCT 91.4  78.2 - 95.6 (%)   CBC     Status: Abnormal   Collection Time  04/07/11 12:12 AM      Component Value Range Comment   WBC 7.3  4.0 - 10.5 (K/uL)    RBC 4.93  3.87 - 5.11 (MIL/uL)    Hemoglobin 15.2 (*) 12.0 - 15.0 (g/dL)    HCT 16.1  09.6 - 04.5 (%)    MCV 90.5  78.0 - 100.0 (fL)    MCH 30.8  26.0 - 34.0 (pg)    MCHC 34.1  30.0 - 36.0 (g/dL)    RDW 40.9  81.1 - 91.4 (%)    Platelets 222  150 - 400 (K/uL)   CREATININE, SERUM     Status: Abnormal   Collection Time   04/07/11 12:12 AM      Component Value Range Comment   Creatinine, Ser 0.73  0.50 - 1.10 (mg/dL)    GFR calc non Af Amer 79 (*) >90 (mL/min)    GFR calc Af Amer >90  >90 (mL/min)    Ct Head Wo Contrast  04/06/2011  *RADIOLOGY REPORT*  Clinical Data: Code stroke;  difficulty speaking.  CT HEAD WITHOUT CONTRAST  Technique:  Contiguous axial images were obtained from the base of the skull through the vertex without contrast.  Comparison: None.  Findings:   There are vague areas of decreased attenuation within the left thalamus and along the right external capsule; these reflect infarcts of indeterminate age.  If these are acute, these likely reflect an embolic event.  There is no evidence of hemorrhagic transformation.  No intra or extra-axial hemorrhage is seen.  No mass lesions are identified. Calcification is seen within the basal ganglia bilaterally.  Mild cerebellar atrophy is noted.  The brainstem and fourth ventricle are within normal limits.  The third and lateral ventricles are unremarkable in appearance.  The cerebral hemispheres are symmetric in appearance, with normal gray- white differentiation.  No mass effect or midline shift is seen.  There is no evidence of fracture; visualized osseous structures are unremarkable in appearance.  The visualized portions of the orbits are within normal limits.  The paranasal sinuses and mastoid air cells are well-aerated.  No significant soft tissue abnormalities are seen.  IMPRESSION: Small vague areas of decreased attenuation within the left thalamus and along the right external capsule; these reflect infarcts of indeterminate age.  If these are acute, these likely reflect an embolic event.  No evidence of hemorrhagic transformation at this time.  These results were called by telephone on 04/06/2011  at  09:57 p.m. to  Dr. Quita Skye, who verbally acknowledged these results.  Original Report Authenticated By: Tonia Ghent, M.D.    Review of Systems  Constitutional: Negative.   HENT: Negative.   Eyes: Negative.   Respiratory: Negative.   Cardiovascular: Negative.   Gastrointestinal: Negative.   Genitourinary: Negative.   Musculoskeletal: Negative.   Skin: Negative.   Neurological: Positive for speech change.        Confusion.  Endo/Heme/Allergies: Negative.   Psychiatric/Behavioral: Negative.     Blood pressure 148/71, pulse 62, temperature 97.7 F (36.5 C), temperature source Oral, resp. rate 16, SpO2 96.00%. Physical Exam  Constitutional: She appears well-developed and well-nourished. No distress.  HENT:  Head: Normocephalic and atraumatic.  Right Ear: External ear normal.  Left Ear: External ear normal.  Nose: Nose normal.  Mouth/Throat: Oropharynx is clear and moist. No oropharyngeal exudate.  Eyes: Conjunctivae are normal. Pupils are equal, round, and reactive to light. Right eye exhibits no discharge. Left eye exhibits no discharge. No scleral icterus.  Neck: Normal range of motion. Neck supple.  Cardiovascular:       Mild sinus bradycardia.  Respiratory: Effort normal and breath sounds normal. No respiratory distress. She has no wheezes. She has no rales.  GI: Soft. Bowel sounds are normal. She exhibits no distension. There is no tenderness. There is no rebound.  Musculoskeletal: Normal range of motion. She exhibits no edema and no tenderness.  Neurological: She is alert.       Patient appears little bit confused. She is oriented to her name but has difficulty recalling her PCPs name. She recognizes her daughter. Follows commands. Moves upper and lower extremities 5/5.  Skin: Skin is warm and dry. No rash noted. She is not diaphoretic. No erythema.  Psychiatric: Her behavior is normal.     Assessment/Plan #1. CVA - patient has been already evaluated by neurologist. We will get MRI/MRA brain and carotid Doppler and 2-D echo. Telemetry monitor shows sinus bradycardia. Follow chest x-ray and EKG. Patient is on Plavix.  CODE STATUS - full code.  Elizandro Laura N. 04/07/2011, 2:06 AM

## 2011-04-07 NOTE — Progress Notes (Signed)
Bilateral carotid artery duplex completed.  Preliminary report is no evidence of significant ICA stenosis. 

## 2011-04-07 NOTE — Progress Notes (Signed)
Patient seen, admitted with confusion, diplopia. Dr Anne Hahn has already seen the patient. MRI Brain shows left thalamic infarct. Discussed with family. Agree with dr Katherene Ponto plan. Will follow

## 2011-04-07 NOTE — Progress Notes (Signed)
Stroke Team Progress Note  HISTORY  Traci [name] is an 78 y.o. female white presenting in the ER with speech disturbance. The patient tells me around 2:30 today she had an episode of double vision. She cannot tell me how long this lasted. The patient then watched TV and later on did not feel right. Her daughter called around 6 in the evening and noticed that her mother was not making sense on the phone. Her daughter-in-law came over and checked on her and at that time it was decided to call EMS. The patient cannot tell me if she felt completely okay after the double vision. It is not clear if at any point between 2:30 and arrival to the ER there was any return to her baseline/normal. She does admit to not feeling right after the double vision but is not able to describe what she means.. She currently does not see double. She denies any problems with swallowing. The patient denies any dizziness/vertigo/lightheadedness. She does not have any numbness or tingling anywhere nor does she have any weakness.  She believes she took her medication which includes a [employer] aspirin aspirin around 12pm today.   SUBJECTIVE Her family, son and daughter is at the bedside. Overall she feels her condition is gradually improving. The patient denies any headache, numbness or weakness of the arms, face, or legs.  OBJECTIVE Most recent Vital Signs: Temp: 97.6 F (36.4 C) (03/01 0854) Temp src: Oral (03/01 0854) BP: 117/69 mmHg (03/01 0854) Pulse Rate: 64  (03/01 0854) Respiratory Rate: 18 O2 Saturation: 96%  CBG (last 3)  Basename 04/06/11 2155  GLUCAP 90   Intake/Output from previous day:    IV Fluid Intake:     . sodium chloride 100 mL/hr at 04/07/11 0147   Medications    . clopidogrel  300 mg Oral Once  . clopidogrel  75 mg Oral Q breakfast  . enoxaparin  30 mg Subcutaneous Q24H  PRN senna-docusate  Diet:  Cardiac thin liquids Activity:  Bedrest head of bed up to 30 degrees DVT Prophylaxis:   lovenox  Significant Diagnostic Studies: CBC    Component Value Date/Time   WBC 7.3 04/07/2011 0012   RBC 4.93 04/07/2011 0012   HGB 15.2* 04/07/2011 0012   HCT 44.6 04/07/2011 0012   PLT 222 04/07/2011 0012   MCV 90.5 04/07/2011 0012   MCH 30.8 04/07/2011 0012   MCHC 34.1 04/07/2011 0012   RDW 12.9 04/07/2011 0012   LYMPHSABS 2.8 04/06/2011 2150   MONOABS 0.4 04/06/2011 2150   EOSABS 0.2 04/06/2011 2150   BASOSABS 0.1 04/06/2011 2150   CMP    Component Value Date/Time   NA 142 04/06/2011 2151   K 4.0 04/06/2011 2151   CL 106 04/06/2011 2151   CO2 25 04/06/2011 2150   GLUCOSE 106* 04/06/2011 2151   BUN 16 04/06/2011 2151   CREATININE 0.73 04/07/2011 0012   CALCIUM 10.2 04/06/2011 2150   PROT 6.8 04/06/2011 2150   ALBUMIN 3.7 04/06/2011 2150   AST 17 04/06/2011 2150   ALT 11 04/06/2011 2150   ALKPHOS 54 04/06/2011 2150   BILITOT 0.6 04/06/2011 2150   GFRNONAA 79* 04/07/2011 0012   GFRAA >90 04/07/2011 0012   COAGS Lab Results  Component Value Date   INR 0.93 04/06/2011   Lipid Panel    Component Value Date/Time   CHOL 143 04/07/2011 0530   TRIG 99 04/07/2011 0530   HDL 55 04/07/2011 0530   CHOLHDL 2.6 04/07/2011 0530  VLDL 20 04/07/2011 0530   LDLCALC 68 04/07/2011 0530   HgbA1C  No results found for this basename: HGBA1C   Urine Drug Screen  No results found for this basename: labopia, cocainscrnur, labbenz, amphetmu, thcu, labbarb    Alcohol Level No results found for this basename: eth     Results for orders placed during the hospital encounter of 04/06/11 (from the past 24 hour(s))  PROTIME-INR     Status: Normal   Collection Time   04/06/11  9:50 PM      Component Value Range   Prothrombin Time 12.7  11.6 - 15.2 (seconds)   INR 0.93  0.00 - 1.49   APTT     Status: Normal   Collection Time   04/06/11  9:50 PM      Component Value Range   aPTT 28  24 - 37 (seconds)  CBC     Status: Normal   Collection Time   04/06/11  9:50 PM      Component Value Range   WBC 6.7  4.0 - 10.5 (K/uL)   RBC  4.65  3.87 - 5.11 (MIL/uL)   Hemoglobin 14.1  12.0 - 15.0 (g/dL)   HCT 96.0  45.4 - 09.8 (%)   MCV 90.8  78.0 - 100.0 (fL)   MCH 30.3  26.0 - 34.0 (pg)   MCHC 33.4  30.0 - 36.0 (g/dL)   RDW 11.9  14.7 - 82.9 (%)   Platelets 217  150 - 400 (K/uL)  DIFFERENTIAL     Status: Normal   Collection Time   04/06/11  9:50 PM      Component Value Range   Neutrophils Relative 47  43 - 77 (%)   Neutro Abs 3.2  1.7 - 7.7 (K/uL)   Lymphocytes Relative 42  12 - 46 (%)   Lymphs Abs 2.8  0.7 - 4.0 (K/uL)   Monocytes Relative 7  3 - 12 (%)   Monocytes Absolute 0.4  0.1 - 1.0 (K/uL)   Eosinophils Relative 3  0 - 5 (%)   Eosinophils Absolute 0.2  0.0 - 0.7 (K/uL)   Basophils Relative 1  0 - 1 (%)   Basophils Absolute 0.1  0.0 - 0.1 (K/uL)  COMPREHENSIVE METABOLIC PANEL     Status: Abnormal   Collection Time   04/06/11  9:50 PM      Component Value Range   Sodium 141  135 - 145 (mEq/L)   Potassium 4.0  3.5 - 5.1 (mEq/L)   Chloride 105  96 - 112 (mEq/L)   CO2 25  19 - 32 (mEq/L)   Glucose, Bld 105 (*) 70 - 99 (mg/dL)   BUN 16  6 - 23 (mg/dL)   Creatinine, Ser 5.62  0.50 - 1.10 (mg/dL)   Calcium 13.0  8.4 - 10.5 (mg/dL)   Total Protein 6.8  6.0 - 8.3 (g/dL)   Albumin 3.7  3.5 - 5.2 (g/dL)   AST 17  0 - 37 (U/L)   ALT 11  0 - 35 (U/L)   Alkaline Phosphatase 54  39 - 117 (U/L)   Total Bilirubin 0.6  0.3 - 1.2 (mg/dL)   GFR calc non Af Amer 78 (*) >90 (mL/min)   GFR calc Af Amer >90  >90 (mL/min)  CK TOTAL AND CKMB     Status: Normal   Collection Time   04/06/11  9:50 PM      Component Value Range   Total CK 59  7 -  177 (U/L)   CK, MB 2.2  0.3 - 4.0 (ng/mL)   Relative Index RELATIVE INDEX IS INVALID  0.0 - 2.5   TROPONIN I     Status: Normal   Collection Time   04/06/11  9:50 PM      Component Value Range   Troponin I <0.30  <0.30 (ng/mL)  POCT I-STAT, CHEM 8     Status: Abnormal   Collection Time   04/06/11  9:51 PM      Component Value Range   Sodium 142  135 - 145 (mEq/L)   Potassium  4.0  3.5 - 5.1 (mEq/L)   Chloride 106  96 - 112 (mEq/L)   BUN 16  6 - 23 (mg/dL)   Creatinine, Ser 1.61  0.50 - 1.10 (mg/dL)   Glucose, Bld 096 (*) 70 - 99 (mg/dL)   Calcium, Ion 0.45  4.09 - 1.32 (mmol/L)   TCO2 26  0 - 100 (mmol/L)   Hemoglobin 15.0  12.0 - 15.0 (g/dL)   HCT 81.1  91.4 - 78.2 (%)  GLUCOSE, CAPILLARY     Status: Normal   Collection Time   04/06/11  9:55 PM      Component Value Range   Glucose-Capillary 90  70 - 99 (mg/dL)  CBC     Status: Abnormal   Collection Time   04/07/11 12:12 AM      Component Value Range   WBC 7.3  4.0 - 10.5 (K/uL)   RBC 4.93  3.87 - 5.11 (MIL/uL)   Hemoglobin 15.2 (*) 12.0 - 15.0 (g/dL)   HCT 95.6  21.3 - 08.6 (%)   MCV 90.5  78.0 - 100.0 (fL)   MCH 30.8  26.0 - 34.0 (pg)   MCHC 34.1  30.0 - 36.0 (g/dL)   RDW 57.8  46.9 - 62.9 (%)   Platelets 222  150 - 400 (K/uL)  CREATININE, SERUM     Status: Abnormal   Collection Time   04/07/11 12:12 AM      Component Value Range   Creatinine, Ser 0.73  0.50 - 1.10 (mg/dL)   GFR calc non Af Amer 79 (*) >90 (mL/min)   GFR calc Af Amer >90  >90 (mL/min)  LIPID PANEL     Status: Normal   Collection Time   04/07/11  5:30 AM      Component Value Range   Cholesterol 143  0 - 200 (mg/dL)   Triglycerides 99  <528 (mg/dL)   HDL 55  >41 (mg/dL)   Total CHOL/HDL Ratio 2.6     VLDL 20  0 - 40 (mg/dL)   LDL Cholesterol 68  0 - 99 (mg/dL)    Dg Chest 2 View  04/08/4399  *RADIOLOGY REPORT*  Clinical Data: Stroke, weakness  CHEST - 2 VIEW  Comparison: None  Findings: Mild enlargement of cardiac silhouette. Mediastinal contours and pulmonary vascularity normal. Emphysematous and bronchitic changes with biapical scarring. Questionable density at the right apex versus summation artifact of osseous structures. No pulmonary infiltrate or pleural effusion. No pneumothorax. Bones demineralized. Pectus excavatum.  IMPRESSION: Emphysematous and bronchitic changes with biapical scarring. Enlargement of cardiac silhouette.  Questionable density right apex versus summation artifact of osseous structures. Pulmonary nodule not completely excluded; recommend assessment by performance of an apical lordotic chest radiograph.  Original Report Authenticated By: Lollie Marrow, M.D.   Ct Head Wo Contrast  04/06/2011  *RADIOLOGY REPORT*  Clinical Data: Code stroke; difficulty speaking.  CT HEAD WITHOUT CONTRAST  Technique:  Contiguous axial images were obtained from the base of the skull through the vertex without contrast.  Comparison: None.  Findings:   There are vague areas of decreased attenuation within the left thalamus and along the right external capsule; these reflect infarcts of indeterminate age.  If these are acute, these likely reflect an embolic event.  There is no evidence of hemorrhagic transformation.  No intra or extra-axial hemorrhage is seen.  No mass lesions are identified. Calcification is seen within the basal ganglia bilaterally.  Mild cerebellar atrophy is noted.  The brainstem and fourth ventricle are within normal limits.  The third and lateral ventricles are unremarkable in appearance.  The cerebral hemispheres are symmetric in appearance, with normal gray- white differentiation.  No mass effect or midline shift is seen.  There is no evidence of fracture; visualized osseous structures are unremarkable in appearance.  The visualized portions of the orbits are within normal limits.  The paranasal sinuses and mastoid air cells are well-aerated.  No significant soft tissue abnormalities are seen.  IMPRESSION: Small vague areas of decreased attenuation within the left thalamus and along the right external capsule; these reflect infarcts of indeterminate age.  If these are acute, these likely reflect an embolic event.  No evidence of hemorrhagic transformation at this time.  These results were called by telephone on 04/06/2011  at  09:57 p.m. to  Dr. Quita Skye, who verbally acknowledged these results.  Original Report  Authenticated By: Tonia Ghent, M.D.   Mr Brain Wo Contrast  04/07/2011  *RADIOLOGY REPORT*  Clinical Data:  Stroke.  Diplopia and confusion  MRI HEAD WITHOUT CONTRAST MRA HEAD WITHOUT CONTRAST  Technique:  Multiplanar, multiecho pulse sequences of the brain and surrounding structures were obtained without intravenous contrast. Angiographic images of the head were obtained using MRA technique without contrast.  Comparison:  CT 04/06/2011  MRI HEAD  Findings:  Acute infarct left thalamus.  This corresponds to the hypodensity on CT.  This measures approximately 13 x 15 mm in diameter.  No other areas of acute infarction are identified.  Ventricle size is normal.  No significant atrophy.  No significant chronic ischemic changes.  Brainstem is normal.  No hemorrhage or mass lesion is identified.  Negative for midline shift.  Paranasal sinuses are clear.  IMPRESSION: Acute infarct left thalamus.  Otherwise no significant abnormality.  MRA HEAD  Findings: Both vertebral arteries are patent to the basilar.  PICA, superior cerebellar, and posterior cerebral arteries are widely patent bilaterally without significant stenosis. Specifically, the left posterior cerebral artery is widely patent.  Small left AICA is patent.  Right AICA not visualized.  This may be a normal variant as the right PICA is a relatively large vessel.  Right posterior communicating artery is patent.  Internal carotid artery is widely patent bilaterally.  Anterior and middle cerebral arteries are patent bilaterally.  Negative for cerebral aneurysm.  IMPRESSION: Negative  Original Report Authenticated By: Camelia Phenes, M.D.   Mr Mra Head/brain Wo Cm  04/07/2011  *RADIOLOGY REPORT*  Clinical Data:  Stroke.  Diplopia and confusion  MRI HEAD WITHOUT CONTRAST MRA HEAD WITHOUT CONTRAST  Technique:  Multiplanar, multiecho pulse sequences of the brain and surrounding structures were obtained without intravenous contrast. Angiographic images of the head  were obtained using MRA technique without contrast.  Comparison:  CT 04/06/2011  MRI HEAD  Findings:  Acute infarct left thalamus.  This corresponds to the hypodensity on CT.  This measures approximately 13  x 15 mm in diameter.  No other areas of acute infarction are identified.  Ventricle size is normal.  No significant atrophy.  No significant chronic ischemic changes.  Brainstem is normal.  No hemorrhage or mass lesion is identified.  Negative for midline shift.  Paranasal sinuses are clear.  IMPRESSION: Acute infarct left thalamus.  Otherwise no significant abnormality.  MRA HEAD  Findings: Both vertebral arteries are patent to the basilar.  PICA, superior cerebellar, and posterior cerebral arteries are widely patent bilaterally without significant stenosis. Specifically, the left posterior cerebral artery is widely patent.  Small left AICA is patent.  Right AICA not visualized.  This may be a normal variant as the right PICA is a relatively large vessel.  Right posterior communicating artery is patent.  Internal carotid artery is widely patent bilaterally.  Anterior and middle cerebral arteries are patent bilaterally.  Negative for cerebral aneurysm.  IMPRESSION: Negative  Original Report Authenticated By: Camelia Phenes, M.D.    CT of the brain    IMPRESSION:  Small vague areas of decreased attenuation within the left thalamus  and along the right external capsule; these reflect infarcts of  indeterminate age. If these are acute, these likely reflect an  embolic event. No evidence of hemorrhagic transformation at this  time.  CT angio  not ordered  MRI of the brain    IMPRESSION:  Acute infarct left thalamus. Otherwise no significant abnormality.   MRA of the brain   IMPRESSION:  Negative   2D Echocardiogram  done, pending  Carotid Doppler   Bilateral carotid artery duplex completed. Preliminary report is no evidence of significant ICA stenosis.   CXR   IMPRESSION:    Emphysematous and bronchitic changes with biapical scarring.  Enlargement of cardiac silhouette.  Questionable density right apex versus summation artifact of  osseous structures.  Pulmonary nodule not completely excluded; recommend assessment by  performance of an apical lordotic chest radiograph.   EKG  normal sinus rhythm, rate 72   Physical Exam   The patient is alert and cooperative.  Neurologic exam reveals full extraocular movements, speech is normal. Visual fields are full.  Motor testing reveals good strength of all four extremities.  The patient has good finger-nose-finger and heel-to-shin bilaterally. Gait was not tested.  Deep tendon reflexes are symmetric and normal. Toes are down going bilaterally.  The patient appears to have a mild dysarthria, and appears to have some word finding issues.  Affect appears to be slightly flat.    ASSESSMENT Ms. Traci [name] is a 78 y.o. female with a left thalamic stroke, secondary to small vessel disease. On clopidogrel 75 mg orally every day for secondary stroke prevention.  Stroke risk factors:  none  Hospital day # 1  TREATMENT/PLAN Continue clopidogrel 75 mg orally every day for secondary stroke prevention.   Patient does not have any significant risk factors for stroke.  2-D echocardiogram has been done, results are pending.  Physical and occupational therapy will see the patient.  Prognosis for this patient should be good, the patient will likely not need inpatient rehabilitation.  Lesly Dukes

## 2011-04-07 NOTE — Progress Notes (Signed)
PT Cancellation Note  Evaluation cancelled at this time due to pt on bedrest. Awaiting increased activity order.Marland Kitchen  Miki Labuda 04/07/2011, 8:22 AM Pager [phone]

## 2011-04-07 NOTE — Progress Notes (Signed)
OT Cancellation Note  Treatment cancelled today due to pt. with bedrest orders.  Initial OT orders were signed and held with start date 04/08/11.  MD please increase activity orders if you wish pt. to receive therapies 04/07/11.  Jeani Hawking [phone] 04/07/2011, 12:21 PM

## 2011-04-08 LAB — LIPID PANEL
Cholesterol: 133 mg/dL (ref 0–200)
Total CHOL/HDL Ratio: 2.6 RATIO
VLDL: 25 mg/dL (ref 0–40)

## 2011-04-08 LAB — COMPREHENSIVE METABOLIC PANEL
ALT: 8 U/L (ref 0–35)
AST: 17 U/L (ref 0–37)
Albumin: 3 g/dL — ABNORMAL LOW (ref 3.5–5.2)
Calcium: 8.9 mg/dL (ref 8.4–10.5)
Creatinine, Ser: 0.73 mg/dL (ref 0.50–1.10)
Sodium: 142 mEq/L (ref 135–145)

## 2011-04-08 LAB — CBC
MCH: 31.8 pg (ref 26.0–34.0)
MCV: 90.5 fL (ref 78.0–100.0)
Platelets: 209 10*3/uL (ref 150–400)
RDW: 13 % (ref 11.5–15.5)

## 2011-04-08 MED ORDER — CLOPIDOGREL BISULFATE 75 MG PO TABS: 75.0000 mg | ORAL_TABLET | Freq: Every day | ORAL | Status: AC

## 2011-04-08 NOTE — Discharge Summary (Signed)
Traci [name], 78 y.o., DOB [birth year], MRN [id number]. Admission date: 04/06/2011 Discharge Date 04/08/2011 Primary MD Marguarite Arbour, MD, MD Admitting Physician Eduard Clos, MD  Admission Diagnosis  Stroke [434.91] CVA  Discharge Diagnosis   Active Problems:  Stroke    Past Medical History  Diagnosis Date  . History of esophageal stricture   . GERD (gastroesophageal reflux disease)   . Stroke 04/06/2011    residual:  aphasia  . Arthritis     Past Surgical History  Procedure Date  . Esophageal dilation   . Thyroidectomy, partial   . Cataract extraction w/ intraocular lens implant     right     Brief History and Physical Traci [name] is an 78 y.o. female white presenting in the ER with speech disturbance. The patient tells me around 2:30 today she had an episode of double vision. She cannot tell me how long this lasted. The patient then watched TV and later on did not feel right. Her daughter called around 6 in the evening and noticed that her mother was not making sense on the phone. Her daughter-in-law came over and checked on her and at that time it was decided to call EMS. The patient cannot tell me if she felt completely okay after the double vision. It is not clear if at any point between 2:30 and arrival to the ER there was any return to her baseline/normal. She does admit to not feeling right after the double vision but is not able to describe what she means.. She currently does not see double. She denies any problems with swallowing. The patient denies any dizziness/vertigo/lightheadedness. She does not have any numbness or tingling anywhere nor does she have any weakness.  She believes she took her medication which includes a Bayer aspirin aspirin around 12pm today.   Hospital Course  Left thalamic stroke Patient in the hospital with episode of double vision, did not have numbness or tingling did not have any weakness. The CT of the head was negative for any  stroke MRI brain showed left thalamic stroke, she was seen by neurology and they switched aspirin to Plavix. PT OT has seen the patient and recommend home PT OT and speech therapy as outpatient. At this time patient is stable for discharge. We'll discharge her on Plavix 75 mg by mouth daily. Patient will follow up with Dr. Anne Hahn as outpatient in 2 months.  Questionable pulmonary nodule Repeat chest x-ray should be done once patient is more stable to rule out any underlying pulmonary nodule.    Consults  neurology  Significant Tests:  See full reports for all details    Dg Chest 2 View  04/07/2011   IMPRESSION: Emphysematous and bronchitic changes with biapical scarring. Enlargement of cardiac silhouette. Questionable density right apex versus summation artifact of osseous structures. Pulmonary nodule not completely excluded; recommend assessment by performance of an apical lordotic chest radiograph.  Original Report Authenticated By: Lollie Marrow, M.D.   Ct Head Wo Contrast  04/06/2011  .  IMPRESSION: Small vague areas of decreased attenuation within the left thalamus and along the right external capsule; these reflect infarcts of indeterminate age.  If these are acute, these likely reflect an embolic event.  No evidence of hemorrhagic transformation at this time.  These results were called by telephone on 04/06/2011  at  09:57 p.m. to  Dr. Quita Skye, who verbally acknowledged these results.  Original Report Authenticated By: Tonia Ghent, M.D.   Mr Brain Wo Contrast  04/07/2011  *RADIOLOGY REPORT*  Clinical Data:  Stroke.  Diplopia and confusion  MRI HEAD WITHOUT CONTRAST MRA HEAD WITHOUT CONTRAST  Technique:  Multiplanar, multiecho pulse sequences of the brain and surrounding structures were obtained without intravenous contrast. Angiographic images of the head were obtained using MRA technique without contrast.  Comparison:  CT 04/06/2011  MRI HEAD  Findings:  Acute infarct left thalamus.   This corresponds to the hypodensity on CT.  This measures approximately 13 x 15 mm in diameter.  No other areas of acute infarction are identified.  Ventricle size is normal.  No significant atrophy.  No significant chronic ischemic changes.  Brainstem is normal.  No hemorrhage or mass lesion is identified.  Negative for midline shift.  Paranasal sinuses are clear.  IMPRESSION: Acute infarct left thalamus.  Otherwise no significant abnormality.   MRA HEAD  Findings: Both vertebral arteries are patent to the basilar.  PICA, superior cerebellar, and posterior cerebral arteries are widely patent bilaterally without significant stenosis. Specifically, the left posterior cerebral artery is widely patent.  Small left AICA is patent.  Right AICA not visualized.  This may be a normal variant as the right PICA is a relatively large vessel.  Right posterior communicating artery is patent.  Internal carotid artery is widely patent bilaterally.  Anterior and middle cerebral arteries are patent bilaterally.  Negative for cerebral aneurysm.  IMPRESSION: Negative  Original Report Authenticated By: Camelia Phenes, M.D.   Mr Mra Head/brain Wo Cm  04/07/2011  *RADIOLOGY REPORT*  Clinical Data:  Stroke.  Diplopia and confusion  MRI HEAD WITHOUT CONTRAST MRA HEAD WITHOUT CONTRAST  Technique:  Multiplanar, multiecho pulse sequences of the brain and surrounding structures were obtained without intravenous contrast. Angiographic images of the head were obtained using MRA technique without contrast.  Comparison:  CT 04/06/2011  MRI HEAD  Findings:  Acute infarct left thalamus.  This corresponds to the hypodensity on CT.  This measures approximately 13 x 15 mm in diameter.  No other areas of acute infarction are identified.  Ventricle size is normal.  No significant atrophy.  No significant chronic ischemic changes.  Brainstem is normal.  No hemorrhage or mass lesion is identified.  Negative for midline shift.  Paranasal sinuses are  clear.  IMPRESSION: Acute infarct left thalamus.  Otherwise no significant abnormality.  MRA HEAD  Findings: Both vertebral arteries are patent to the basilar.  PICA, superior cerebellar, and posterior cerebral arteries are widely patent bilaterally without significant stenosis. Specifically, the left posterior cerebral artery is widely patent.  Small left AICA is patent.  Right AICA not visualized.  This may be a normal variant as the right PICA is a relatively large vessel.  Right posterior communicating artery is patent.  Internal carotid artery is widely patent bilaterally.  Anterior and middle cerebral arteries are patent bilaterally.  Negative for cerebral aneurysm.  IMPRESSION: Negative  Original Report Authenticated By: Camelia Phenes, M.D.     Today   Subjective:   Traci [name] today has no headache,no chest abdominal pain,no new weakness  Objective:   Blood pressure 130/82, pulse 78, temperature 98 F (36.7 C), temperature source Oral, resp. rate 18, height 5\' 5"  (1.651 m), weight 60.51 kg (133 lb 6.4 oz), SpO2 96.00%.  Intake/Output Summary (Last 24 hours) at 04/08/11 1407 Last data filed at 04/08/11 1248  Gross per 24 hour  Intake   2500 ml  Output      0 ml  Net   2500  ml    Exam Awake Alert, Oriented *3, No new F.N deficits, Normal affect [place].AT,PERRAL Supple Neck,No JVD, No cervical lymphadenopathy appriciated.  Symmetrical Chest wall movement, Good air movement bilaterally, CTAB RRR,No Gallops,Rubs or new Murmurs, No Parasternal Heave +ve B.Sounds, Abd Soft, Non tender, No organomegaly appriciated, No rebound -guarding or rigidity. No Cyanosis, Clubbing or edema, No new Rash or bruise  Data Review   Cultures -  CBC w Diff: Lab Results  Component Value Date   WBC 5.8 04/08/2011   HGB 14.0 04/08/2011   HCT 39.8 04/08/2011   PLT 209 04/08/2011   LYMPHOPCT 42 04/06/2011   MONOPCT 7 04/06/2011   EOSPCT 3 04/06/2011   BASOPCT 1 04/06/2011   CMP: Lab Results  Component  Value Date   NA 142 04/08/2011   K 3.7 04/08/2011   CL 110 04/08/2011   CO2 22 04/08/2011   BUN 14 04/08/2011   CREATININE 0.73 04/08/2011   PROT 5.8* 04/08/2011   ALBUMIN 3.0* 04/08/2011   BILITOT 0.6 04/08/2011   ALKPHOS 51 04/08/2011   AST 17 04/08/2011   ALT 8 04/08/2011  .  Micro Results No results found for this or any previous visit (from the past 240 hour(s)).   Discharge Instructions      Follow-up Information    Follow up with Lesly Dukes, MD. Schedule an appointment as soon as possible for a visit in 2 months.   Contact information:   [address] [place] [address] [facility] [place] [address] [phone]          Discharge Medications   Medication List  As of 04/08/2011  2:07 PM   START taking these medications         clopidogrel 75 MG tablet   Commonly known as: PLAVIX   Take 1 tablet (75 mg total) by mouth daily with breakfast.         CONTINUE taking these medications         calcium carbonate 1250 MG chewable tablet   Commonly known as: OS-CAL      multivitamin with minerals tablet      simethicone 80 MG chewable tablet   Commonly known as: MYLICON         STOP taking these medications         aspirin EC 81 MG tablet          Where to get your medications    These are the prescriptions that you need to pick up.   You may get these medications from any pharmacy.         clopidogrel 75 MG tablet             Total Time in preparing paper work, data evaluation and todays exam - 35 minutes  Nyzaiah Kai S M.D on 04/08/2011 at 2:07 PM  [facility] Office  [phone]

## 2011-04-08 NOTE — Progress Notes (Signed)
Occupational Therapy Evaluation Patient Details Name: Traci [name] MRN: [id number] DOB: August 06, 1929 Today's Date: 04/08/2011  Problem List:  Patient Active Problem List  Diagnoses  . Stroke    Past Medical History:  Past Medical History  Diagnosis Date  . History of esophageal stricture   . GERD (gastroesophageal reflux disease)   . Stroke 04/06/2011    residual:  aphasia  . Arthritis    Past Surgical History:  Past Surgical History  Procedure Date  . Esophageal dilation   . Thyroidectomy, partial   . Cataract extraction w/ intraocular lens implant     right    OT Assessment/Plan/Recommendation OT Assessment Clinical Impression Statement: Pt s/p left thalamic stroke thus affecting PLOF.  Will follow acutely to address below problem list in prep for d/c home with family's intermittent supervision. OT Recommendation/Assessment: Patient will need skilled OT in the acute care venue OT Problem List: Decreased activity tolerance;Impaired balance (sitting and/or standing) OT Therapy Diagnosis : Generalized weakness OT Plan OT Frequency: Min 2X/week OT Treatment/Interventions: Self-care/ADL training;Therapeutic activities;Patient/family education;Balance training OT Recommendation Follow Up Recommendations: Home health OT;Outpatient OT;Supervision - Intermittent Equipment Recommended: None recommended by OT Individuals Consulted Consulted and Agree with Results and Recommendations: Patient OT Goals Acute Rehab OT Goals OT Goal Formulation: With patient Time For Goal Achievement: 7 days ADL Goals Pt Will Transfer to Toilet: Independently;Regular height toilet;Ambulation ADL Goal: Toilet Transfer - Progress: Goal set today Pt Will Perform Tub/Shower Transfer: Shower transfer;Independently;Ambulation ADL Goal: Tub/Shower Transfer - Progress: Goal set today  OT Evaluation Precautions/Restrictions    Prior Functioning Home Living Lives With: Alone Receives Help From:  Family (Son manages finances) Type of Home: House Home Layout: One level Home Access: Stairs to enter Entrance Stairs-Rails: Right Entrance Stairs-Number of Steps: 3 Bathroom Shower/Tub: Psychologist, counselling;Tub/shower unit Bathroom Toilet: Standard Bathroom Accessibility:  (one yes to RW, the other no) Home Adaptive Equipment: None Prior Function Level of Independence: Independent with basic ADLs;Independent with gait;Independent with transfers Driving: Yes Vocation: Retired ADL ADL Grooming: Performed;Wash/dry hands;Supervision/safety Where Assessed - Grooming: Standing at [employer] Transfer: Performed;Other (comment) (min guard) Toilet Transfer Method: Proofreader: Regular height toilet Toileting - Clothing Manipulation: Performed;Supervision/safety Where Assessed - Toileting Clothing Manipulation: Sit to stand from 3-in-1 or toilet Toileting - Hygiene: Performed;Modified independent Where Assessed - Toileting Hygiene: Sit on 3-in-1 or toilet Ambulation Related to ADLs: Pt with some imbalance when ambulating through room due to narrow base over support. Pt with overall good functional mobility. Vision/Perception    Cognition Cognition Arousal/Alertness: Awake/alert Overall Cognitive Status: Appears within functional limits for tasks assessed Orientation Level: Oriented X4 Cognition - Other Comments: Pt with difficulty speaking correct words in context. Appears to have no receptive aphasia. Sensation/Coordination Sensation Light Touch: Appears Intact (bil. LEs to light touch.) Coordination Gross Motor Movements are Fluid and Coordinated: Yes Fine Motor Movements are Fluid and Coordinated: Yes Extremity Assessment RUE Assessment RUE Assessment: Within Functional Limits LUE Assessment LUE Assessment: Within Functional Limits Mobility  Bed Mobility Bed Mobility: Yes Supine to Sit: 6: Modified independent (Device/Increase time) Transfers Sit to  Stand: 5: Supervision Sit to Stand Details (indicate cue type and reason): Supervision for safety, no physical assist needed.  Stand to Sit: 6: Modified independent (Device/Increase time) Exercises   End of Session OT - End of Session Equipment Utilized During Treatment: Gait belt Activity Tolerance: Patient tolerated treatment well Patient left: in chair;with call bell in reach;with family/visitor present Nurse Communication: Mobility status for transfers General Behavior During  Session: [facility] for tasks performed Cognition: [facility] for tasks performed   1:19 PM  04/08/2011 Cipriano Mile OTR/L Pager [phone] Office [phone]

## 2011-04-08 NOTE — Evaluation (Signed)
Physical Therapy Evaluation Patient Details Name: Traci [name] MRN: [id number] DOB: 1929-08-14 Today's Date: 04/08/2011  Problem List:  Patient Active Problem List  Diagnoses  . Stroke    Past Medical History:  Past Medical History  Diagnosis Date  . History of esophageal stricture   . GERD (gastroesophageal reflux disease)   . Stroke 04/06/2011    residual:  aphasia  . Arthritis    Past Surgical History:  Past Surgical History  Procedure Date  . Esophageal dilation   . Thyroidectomy, partial   . Cataract extraction w/ intraocular lens implant     right    PT Assessment/Plan/Recommendation PT Assessment Clinical Impression Statement: 78 y.o. female with a left thalamic stroke. Pt presents with overal good mobility however has increased risk for falls as indicated byDynamic Gait Index score of 17 (<19 = increased risk for falls). Discussed at length with pt and family, encouraged use of life link and increased supervsion at home. Pt desires to go home. Recommend HHPT vs. outpatient PT pending pt and family preference. Educated pt on signs/symptoms of stroke - need for immediate action if symptoms arise PT Recommendation/Assessment: Patient will need skilled PT in the acute care venue PT Problem List: Decreased balance;Decreased mobility PT Therapy Diagnosis : Difficulty walking;Abnormality of gait PT Plan PT Frequency: Min 4X/week PT Treatment/Interventions: Gait training;Stair training;Functional mobility training;Therapeutic activities;Therapeutic exercise;Balance training;Patient/family education PT Recommendation Follow Up Recommendations: Home health PT;Outpatient PT;Supervision - Intermittent Equipment Recommended: None recommended by PT PT Goals  Acute Rehab PT Goals PT Goal Formulation: With patient Time For Goal Achievement: 2 weeks Pt will go Sit to Stand: Independently PT Goal: Sit to Stand - Progress: Goal set today Pt will go Stand to Sit: Independently PT  Goal: Stand to Sit - Progress: Goal set today Pt will Ambulate: >150 feet;with modified independence PT Goal: Ambulate - Progress: Goal set today Pt will Go Up / Down Stairs: 3-5 stairs;with modified independence PT Goal: Up/Down Stairs - Progress: Goal set today Additional Goals Additional Goal #1: Score >/= 19 on Dynamic Gait Index to indicate decreased risk for falls PT Goal: Additional Goal #1 - Progress: Goal set today  PT Evaluation Precautions/Restrictions   Fall  Prior Functioning  Home Living Lives With: Alone Receives Help From: Family (Son manages finances) Type of Home: House Home Layout: One level Home Access: Stairs to enter Entrance Stairs-Rails: Right Entrance Stairs-Number of Steps: 3 Bathroom Shower/Tub: Psychologist, counselling;Tub/shower unit Bathroom Toilet: Standard Bathroom Accessibility:  (one yes to RW, the other no) Home Adaptive Equipment: None Prior Function Level of Independence: Independent with basic ADLs;Independent with gait;Independent with transfers Driving: Yes Vocation: Retired Financial risk analyst Arousal/Alertness: Awake/alert Overall Cognitive Status: Appears within functional limits for tasks assessed Orientation Level: Oriented X4 Cognition - Other Comments: Pt with difficulty speaking correct words in context. Appears to have no receptive aphasia. Sensation/Coordination Sensation Light Touch: Appears Intact (bil. LEs to light touch.) Coordination Gross Motor Movements are Fluid and Coordinated: Yes Fine Motor Movements are Fluid and Coordinated: Yes Extremity Assessment RLE Assessment RLE Assessment: Exceptions to [facility] RLE AROM (degrees) Overall AROM Right Lower Extremity: Within functional limits for tasks assessed RLE Strength RLE Overall Strength: Deficits RLE Overall Strength Comments: Generalized deconditioning, grossly >/= 4/5 LLE Assessment LLE Assessment: Exceptions to WFL LLE AROM (degrees) Overall AROM Left Lower Extremity:  Within functional limits for tasks assessed LLE Strength LLE Overall Strength: Deficits LLE Overall Strength Comments: Generalized deconditioning, grossly >/= 4/5 Mobility (including Balance) Bed Mobility Bed Mobility: Yes  Supine to Sit: 6: Modified independent (Device/Increase time) Transfers Transfers: Yes Sit to Stand: 5: Supervision Sit to Stand Details (indicate cue type and reason): Supervision for safety, no physical assist needed.  Stand to Sit: 6: Modified independent (Device/Increase time) Ambulation/Gait Ambulation/Gait: Yes Ambulation/Gait Assistance: 4: Min assist Ambulation/Gait Assistance Details (indicate cue type and reason): Up to min assist for occasional cross over stepping, otherwise supervision. Pt with decreased balance with challenges.  Ambulation Distance (Feet): 250 Feet Assistive device: None Gait Pattern: Decreased stride length (occasional narrow base/cross over stepping. ) Stairs: Yes Stairs Assistance: 5: Supervision Stairs Assistance Details (indicate cue type and reason): Supervision for safety Stair Management Technique: One rail Right;Forwards;Alternating pattern Number of Stairs: 2   Posture/Postural Control Posture/Postural Control: No significant limitations Balance Balance Assessed: Yes Static Standing Balance Static Standing - Balance Support: No upper extremity supported Static Standing - Level of Assistance: 5: Stand by assistance;4: Min assist Static Standing - Comment/# of Minutes: Performed normal stance, modified tandem with eyes open with supervision. Pt required min assist to obtain rhomberg. Able to hold >30 sec eyes open, ~15 sec eyes closed indicating increased risk for falls. Dynamic Gait Index Level Surface: Mild Impairment Change in Gait Speed: Normal Gait with Horizontal Head Turns: Mild Impairment Gait with Vertical Head Turns: Mild Impairment Gait and Pivot Turn: Mild Impairment Step Over Obstacle: Mild Impairment Step  Around Obstacles: Mild Impairment Steps: Mild Impairment Total Score: 17  End of Session PT - End of Session Equipment Utilized During Treatment: Gait belt Activity Tolerance: Patient tolerated treatment well Patient left: with call bell in reach;in chair;with family/visitor present Nurse Communication: Mobility status for ambulation General Behavior During Session: [facility] for tasks performed Cognition: [facility] for tasks performed  Sherie Don 04/08/2011, 1:03 PM  Sherie Don) Carleene Mains PT, DPT Acute Rehabilitation [phone]

## 2011-04-08 NOTE — Progress Notes (Signed)
Stroke Team Progress Note  HISTORY Traci [name] is an 78 y.o. female white presenting in the ER with speech disturbance. The patient tells me around 2:30 today she had an episode of double vision. She cannot tell me how long this lasted. The patient then watched TV and later on did not feel right. Her daughter called around 6 in the evening and noticed that her mother was not making sense on the phone. Her daughter-in-law came over and checked on her and at that time it was decided to call EMS. The patient cannot tell me if she felt completely okay after the double vision. It is not clear if at any point between 2:30 and arrival to the ER there was any return to her baseline/normal. She does admit to not feeling right after the double vision but is not able to describe what she means.. She currently does not see double. She denies any problems with swallowing. The patient denies any dizziness/vertigo/lightheadedness. She does not have any numbness or tingling anywhere nor does she have any weakness.  She believes she took her medication which includes a Bayer aspirin aspirin around 12pm today.   SUBJECTIVE Her family, son and daughter is at the bedside. The patient denies any headache, numbness or weakness of the arms, face, or legs.  Has been able to ambulate without difficulty.  Balance ok.  Mentation today appears better.  OBJECTIVE Most recent Vital Signs: Temp: 98 F (36.7 C) (03/02 0436) Temp src: Oral (03/02 0436) BP: 108/66 mmHg (03/02 0436) Pulse Rate: 66  (03/02 0436) Respiratory Rate: 18 O2 Saturation: 95%  CBG (last 3)   Basename 04/06/11 2155  GLUCAP 90   Intake/Output from previous day: 03/01 0701 - 03/02 0700 In: 2260 [P.O.:1060; I.V.:1200] Out: -   IV Fluid Intake:      . sodium chloride 100 mL/hr at 04/08/11 0058   Medications     . clopidogrel  75 mg Oral Q breakfast  . enoxaparin  30 mg Subcutaneous Q24H  . enoxaparin  40 mg Subcutaneous Q24H  PRN  senna-docusate, DISCONTD: senna-docusate  Diet:  Cardiac thin liquids Activity:  Bedrest head of bed up to 30 degrees DVT Prophylaxis:  lovenox  EKG  normal sinus rhythm, rate 72   Physical Exam   The patient is alert and cooperative. Normal speech pattern, no dysarthria or expressive aphasia.  Neurologic exam reveals full extraocular movements, speech is normal. Visual fields are full.  Motor testing reveals good strength of all four extremities.  The patient has good finger-nose-finger and heel-to-shin bilaterally. Gait was not tested.  Deep tendon reflexes are symmetric and normal. Toes are down going bilaterally.  Significant Diagnostic Studies: CBC    Component Value Date/Time   WBC 5.8 04/08/2011 0700   RBC 4.40 04/08/2011 0700   HGB 14.0 04/08/2011 0700   HCT 39.8 04/08/2011 0700   PLT 209 04/08/2011 0700   MCV 90.5 04/08/2011 0700   MCH 31.8 04/08/2011 0700   MCHC 35.2 04/08/2011 0700   RDW 13.0 04/08/2011 0700   LYMPHSABS 2.8 04/06/2011 2150   MONOABS 0.4 04/06/2011 2150   EOSABS 0.2 04/06/2011 2150   BASOSABS 0.1 04/06/2011 2150   CMP    Component Value Date/Time   NA 142 04/06/2011 2151   K 4.0 04/06/2011 2151   CL 106 04/06/2011 2151   CO2 25 04/06/2011 2150   GLUCOSE 106* 04/06/2011 2151   BUN 16 04/06/2011 2151   CREATININE 0.72 04/07/2011 1543   CALCIUM 10.2  04/06/2011 2150   PROT 6.8 04/06/2011 2150   ALBUMIN 3.7 04/06/2011 2150   AST 17 04/06/2011 2150   ALT 11 04/06/2011 2150   ALKPHOS 54 04/06/2011 2150   BILITOT 0.6 04/06/2011 2150   GFRNONAA 80* 04/07/2011 1543   GFRAA >90 04/07/2011 1543   COAGS Lab Results  Component Value Date   INR 0.93 04/06/2011   Lipid Panel    Component Value Date/Time   CHOL 143 04/07/2011 0530   TRIG 99 04/07/2011 0530   HDL 55 04/07/2011 0530   CHOLHDL 2.6 04/07/2011 0530   VLDL 20 04/07/2011 0530   LDLCALC 68 04/07/2011 0530   HgbA1C  Lab Results  Component Value Date   HGBA1C 5.6 04/07/2011    04/07/2011 CHEST - 2 VIEW Findings: Mild enlargement  of cardiac silhouette. Mediastinal contours and pulmonary vascularity normal. Emphysematous and bronchitic changes with biapical scarring. Questionable density at the right apex versus summation artifact of osseous structures. No pulmonary infiltrate or pleural effusion. No pneumothorax. Bones demineralized. Pectus excavatum.  IMPRESSION: Emphysematous and bronchitic changes with biapical scarring. Enlargement of cardiac silhouette. Questionable density right apex versus summation artifact of osseous structures. Pulmonary nodule not completely excluded; recommend assessment by performance of an apical lordotic chest radiograph. Lollie Marrow, M.D.   04/06/2011 CT HEAD WITHOUT CONTRAST Findings:   There are vague areas of decreased attenuation within the left thalamus and along the right external capsule; these reflect infarcts of indeterminate age.  If these are acute, these likely reflect an embolic event.  There is no evidence of hemorrhagic transformation.  No intra or extra-axial hemorrhage is seen.  No mass lesions are identified. Calcification is seen within the basal ganglia bilaterally.  Mild cerebellar atrophy is noted.  The brainstem and fourth ventricle are within normal limits.  The third and lateral ventricles are unremarkable in appearance.  The cerebral hemispheres are symmetric in appearance, with normal gray- white differentiation.  No mass effect or midline shift is seen.  There is no evidence of fracture; visualized osseous structures are unremarkable in appearance.  The visualized portions of the orbits are within normal limits.  The paranasal sinuses and mastoid air cells are well-aerated.  No significant soft tissue abnormalities are seen.  IMPRESSION: Small vague areas of decreased attenuation within the left thalamus and along the right external capsule; these reflect infarcts of indeterminate age.  If these are acute, these likely reflect an embolic event.  No evidence of hemorrhagic  transformation at this time.  JEFFREY CHANG, M.D.   04/07/2011   MRI HEAD WITHOUT CONTRAST  Findings:  Acute infarct left thalamus.  This corresponds to the hypodensity on CT.  This measures approximately 13 x 15 mm in diameter.  No other areas of acute infarction are identified.  Ventricle size is normal.  No significant atrophy.  No significant chronic ischemic changes.  Brainstem is normal.  No hemorrhage or mass lesion is identified.  Negative for midline shift.  Paranasal sinuses are clear.  IMPRESSION: Acute infarct left thalamus.  Otherwise no significant abnormality.   Camelia Phenes, M.D.  04/07/2011 MRA HEAD  Findings: Both vertebral arteries are patent to the basilar.  PICA, superior cerebellar, and posterior cerebral arteries are widely patent bilaterally without significant stenosis. Specifically, the left posterior cerebral artery is widely patent.  Small left AICA is patent.  Right AICA not visualized.  This may be a normal variant as the right PICA is a relatively large vessel.  Right posterior communicating artery  is patent.  Internal carotid artery is widely patent bilaterally.  Anterior and middle cerebral arteries are patent bilaterally.  Negative for cerebral aneurysm.  IMPRESSION: Negative  Camelia Phenes, M.D.   04/07/11 Carotid Doppler :no evidence of significant ICA stenosis. Vertebral flow antegrade.  04/07/11 2D Echo: Study Conclusions Left ventricle systolic function was normal, ejection fraction was in the range of 55% to 60%. There were no regional wall motion abnormalities. Doppler parameters are consistent with abnormal left ventricular relaxation (grade 1 diastolic dysfunction). - Aortic valve: Trivial regurgitation.  Therapies: SLP: 04/07/11 classic presentation of a thalamic aphasia; marked by fairly intact auditory comprehension, intact repetition and fluent, however bizarre paraphasias and low volume. Educated patient and family on this type of aphasia and concern for her  overall safety at home, as she lives alone. Recommend outpatient SLP f/u, although this type of aphasia tends to resolve quickly.  ASSESSMENT Traci [name] is a 78 y.o. female with a left thalamic stroke, secondary to small vessel disease. A1C 5.6, LDL 68.  On clopidogrel 75 mg orally every day for secondary stroke prevention.  Stroke risk factors:  none  Hospital day # 2  TREATMENT/PLAN Continue clopidogrel 75 mg orally every day for secondary stroke prevention.  PT/OT evaluations pending. Patient does not have any significant risk factors for stroke. Has excellent recovery potential.  No further neurologic intervention is recommended at this time.  If further questions arise, please call or page at that time.  Thank you for allowing neurology to participate in the care of this patient.  Patient will need 2 month follow up with Dr. Anne Hahn, neurology.   Marya Fossa PA-C [facility] [phone] 04/08/11 10:00  Lesly Dukes

## 2011-04-08 NOTE — Progress Notes (Signed)
   CARE MANAGEMENT NOTE 04/08/2011  Patient:  Traci [name], Traci [name]   Account Number:  [id number]  Date Initiated:  04/08/2011  Documentation initiated by:  [facility]  Subjective/Objective Assessment:   stroke, Esophageal stricture     Action/Plan:   Anticipated DC Date:  04/08/2011   Anticipated DC Plan:  HOME W HOME HEALTH SERVICES      DC Planning Services  CM consult      [facility] Choice  HOME HEALTH   Choice offered to / List presented to:  C-4 Adult Children        HH arranged  HH-2 PT  HH-3 OT  HH-5 SPEECH THERAPY      HH agency  [facility]   Status of service:  Completed, signed off Medicare Important Message given?   (If response is "NO", the following Medicare IM given date fields will be blank) Date Medicare IM given:   Date Additional Medicare IM given:    Discharge Disposition:  HOME W HOME HEALTH SERVICES  Per UR Regulation:    Comments:  04/08/2011 1600 Spoke to pt and gave permission for NCM to speak son. Provided son with NCM contact information. Offered choice for [facility] and son did not have preference for [facility]. States not DME was needed.  Provided son with contact info for [facility]. Contacted [facility] and faxed orders, facesheet, F2F and d/c summary. Isidoro Donning RN CCM Case Mgmt phone [phone]

## 2011-07-06 ENCOUNTER — Other Ambulatory Visit: Payer: Self-pay | Admitting: Family Medicine

## 2011-07-24 ENCOUNTER — Ambulatory Visit: Payer: Self-pay | Admitting: Internal Medicine

## 2011-09-12 ENCOUNTER — Ambulatory Visit: Payer: Self-pay | Admitting: Internal Medicine

## 2011-09-13 ENCOUNTER — Ambulatory Visit: Payer: Self-pay | Admitting: Internal Medicine

## 2011-10-17 ENCOUNTER — Ambulatory Visit: Payer: Self-pay | Admitting: Internal Medicine

## 2011-12-18 ENCOUNTER — Ambulatory Visit: Payer: Self-pay | Admitting: Obstetrics and Gynecology

## 2011-12-19 ENCOUNTER — Ambulatory Visit: Payer: Self-pay | Admitting: Gynecologic Oncology

## 2012-01-07 ENCOUNTER — Ambulatory Visit: Payer: Self-pay | Admitting: Gynecologic Oncology

## 2012-01-07 DIAGNOSIS — C541 Malignant neoplasm of endometrium: Secondary | ICD-10-CM

## 2012-01-07 HISTORY — DX: Malignant neoplasm of endometrium: C54.1

## 2012-01-07 HISTORY — PX: ABDOMINAL HYSTERECTOMY: SHX81

## 2012-01-23 LAB — URINALYSIS, COMPLETE
Bilirubin,UR: NEGATIVE
Blood: NEGATIVE
RBC,UR: 1 /HPF (ref 0–5)
Squamous Epithelial: <1

## 2012-02-07 ENCOUNTER — Ambulatory Visit: Payer: Self-pay | Admitting: Gynecologic Oncology

## 2012-03-18 ENCOUNTER — Ambulatory Visit: Payer: Self-pay | Admitting: Radiation Oncology

## 2012-03-29 ENCOUNTER — Ambulatory Visit: Payer: Self-pay | Admitting: Internal Medicine

## 2012-03-29 ENCOUNTER — Emergency Department: Payer: Self-pay | Admitting: Emergency Medicine

## 2012-03-29 LAB — CBC
MCH: 31.8 pg (ref 26.0–34.0)
MCHC: 34.6 g/dL (ref 32.0–36.0)
Platelet: 281 10*3/uL (ref 150–440)
RDW: 14.6 % — ABNORMAL HIGH (ref 11.5–14.5)
WBC: 9.1 10*3/uL (ref 3.6–11.0)

## 2012-03-29 LAB — CBC WITH DIFFERENTIAL/PLATELET
Basophil #: 0 10*3/uL (ref 0.0–0.1)
Eosinophil #: 0 10*3/uL (ref 0.0–0.7)
Lymphocyte %: 10.5 %
MCV: 92 fL (ref 80–100)
Neutrophil %: 87 %
RBC: 4.75 10*6/uL (ref 3.80–5.20)
RDW: 14.4 % (ref 11.5–14.5)

## 2012-03-29 LAB — COMPREHENSIVE METABOLIC PANEL
Albumin: 3.7 g/dL (ref 3.4–5.0)
Alkaline Phosphatase: 72 U/L (ref 50–136)
Anion Gap: 7 (ref 7–16)
BUN: 21 mg/dL — ABNORMAL HIGH (ref 7–18)
Glucose: 122 mg/dL — ABNORMAL HIGH (ref 65–99)
Potassium: 4 mmol/L (ref 3.5–5.1)
SGOT(AST): 15 U/L (ref 15–37)
Total Protein: 7.1 g/dL (ref 6.4–8.2)

## 2012-03-29 LAB — URINALYSIS, COMPLETE
Protein: NEGATIVE
Specific Gravity: 1.023 (ref 1.003–1.030)

## 2012-04-06 ENCOUNTER — Ambulatory Visit: Payer: Self-pay | Admitting: Radiation Oncology

## 2012-05-07 ENCOUNTER — Ambulatory Visit: Payer: Self-pay | Admitting: Radiation Oncology

## 2012-06-06 ENCOUNTER — Ambulatory Visit: Payer: Self-pay | Admitting: Gynecologic Oncology

## 2012-06-11 ENCOUNTER — Ambulatory Visit: Payer: Self-pay | Admitting: Gynecologic Oncology

## 2012-07-07 ENCOUNTER — Ambulatory Visit: Payer: Self-pay | Admitting: Gynecologic Oncology

## 2012-09-06 ENCOUNTER — Ambulatory Visit: Payer: Self-pay | Admitting: Radiation Oncology

## 2012-09-12 ENCOUNTER — Ambulatory Visit: Payer: Self-pay | Admitting: Internal Medicine

## 2012-10-07 ENCOUNTER — Ambulatory Visit: Payer: Self-pay | Admitting: Radiation Oncology

## 2012-11-20 ENCOUNTER — Ambulatory Visit: Payer: Self-pay | Admitting: Gastroenterology

## 2012-11-27 ENCOUNTER — Ambulatory Visit: Payer: Self-pay | Admitting: Radiation Oncology

## 2012-12-10 ENCOUNTER — Ambulatory Visit: Payer: Self-pay | Admitting: Gastroenterology

## 2012-12-30 ENCOUNTER — Ambulatory Visit: Payer: Self-pay | Admitting: Internal Medicine

## 2013-01-08 ENCOUNTER — Ambulatory Visit: Payer: Self-pay | Admitting: Gastroenterology

## 2013-01-28 ENCOUNTER — Ambulatory Visit: Payer: Self-pay | Admitting: Gynecologic Oncology

## 2013-02-06 ENCOUNTER — Ambulatory Visit: Payer: Self-pay | Admitting: Gynecologic Oncology

## 2013-02-25 ENCOUNTER — Ambulatory Visit: Payer: Self-pay | Admitting: Gastroenterology

## 2013-05-26 ENCOUNTER — Ambulatory Visit: Payer: Self-pay | Admitting: Gynecologic Oncology

## 2013-06-06 ENCOUNTER — Ambulatory Visit: Payer: Self-pay | Admitting: Gynecologic Oncology

## 2013-06-16 ENCOUNTER — Ambulatory Visit: Payer: Self-pay | Admitting: Neurosurgery

## 2013-07-15 DIAGNOSIS — I82409 Acute embolism and thrombosis of unspecified deep veins of unspecified lower extremity: Secondary | ICD-10-CM | POA: Insufficient documentation

## 2013-07-24 ENCOUNTER — Ambulatory Visit: Payer: Self-pay | Admitting: Vascular Surgery

## 2013-09-15 ENCOUNTER — Ambulatory Visit: Payer: Self-pay | Admitting: Internal Medicine

## 2013-09-16 ENCOUNTER — Ambulatory Visit: Payer: Self-pay | Admitting: Gynecologic Oncology

## 2013-10-07 ENCOUNTER — Ambulatory Visit: Payer: Self-pay | Admitting: Gynecologic Oncology

## 2013-11-04 ENCOUNTER — Emergency Department: Payer: Self-pay | Admitting: Internal Medicine

## 2013-11-04 LAB — COMPREHENSIVE METABOLIC PANEL
ANION GAP: 7 (ref 7–16)
Albumin: 3.5 g/dL (ref 3.4–5.0)
Alkaline Phosphatase: 64 U/L
BUN: 13 mg/dL (ref 7–18)
Bilirubin,Total: 0.5 mg/dL (ref 0.2–1.0)
CHLORIDE: 106 mmol/L (ref 98–107)
CO2: 26 mmol/L (ref 21–32)
CREATININE: 0.9 mg/dL (ref 0.60–1.30)
Calcium, Total: 8.5 mg/dL (ref 8.5–10.1)
EGFR (Non-African Amer.): >60
Glucose: 96 mg/dL (ref 65–99)
Osmolality: 278 (ref 275–301)
Potassium: 4 mmol/L (ref 3.5–5.1)
SGOT(AST): 25 U/L (ref 15–37)
SGPT (ALT): 20 U/L
Sodium: 139 mmol/L (ref 136–145)
Total Protein: 6.8 g/dL (ref 6.4–8.2)

## 2013-11-04 LAB — CBC
HCT: 43.3 % (ref 35.0–47.0)
HGB: 14 g/dL (ref 12.0–16.0)
MCH: 30 pg (ref 26.0–34.0)
MCHC: 32.3 g/dL (ref 32.0–36.0)
MCV: 93 fL (ref 80–100)
Platelet: 241 10*3/uL (ref 150–440)
RBC: 4.65 10*6/uL (ref 3.80–5.20)
RDW: 13 % (ref 11.5–14.5)
WBC: 5.6 10*3/uL (ref 3.6–11.0)

## 2013-11-04 LAB — PROTIME-INR
INR: 1
PROTHROMBIN TIME: 12.9 s (ref 11.5–14.7)

## 2014-02-05 ENCOUNTER — Ambulatory Visit: Payer: Self-pay | Admitting: Neurosurgery

## 2014-02-06 DIAGNOSIS — C50919 Malignant neoplasm of unspecified site of unspecified female breast: Secondary | ICD-10-CM

## 2014-02-06 HISTORY — DX: Malignant neoplasm of unspecified site of unspecified female breast: C50.919

## 2014-02-09 ENCOUNTER — Ambulatory Visit: Payer: Self-pay | Admitting: Vascular Surgery

## 2014-02-09 LAB — PROTIME-INR
INR: 1
Prothrombin Time: 12.9 secs (ref 11.5–14.7)

## 2014-03-05 ENCOUNTER — Ambulatory Visit: Payer: Self-pay | Admitting: Ophthalmology

## 2014-03-05 LAB — POTASSIUM: Potassium: 4.3 mmol/L (ref 3.5–5.1)

## 2014-03-17 ENCOUNTER — Ambulatory Visit: Payer: Self-pay | Admitting: Ophthalmology

## 2014-03-31 ENCOUNTER — Ambulatory Visit: Payer: Self-pay | Admitting: Obstetrics and Gynecology

## 2014-04-07 ENCOUNTER — Ambulatory Visit
Admit: 2014-04-07 | Disposition: A | Discharge status: Home / Self Care | Payer: Self-pay | Attending: Obstetrics and Gynecology | Admitting: Obstetrics and Gynecology

## 2014-05-29 NOTE — Consult Note (Signed)
Reason for Visit: This 81 year old Female patient presents to the clinic for initial evaluation of  endometrial cancer .   Referred by Dr. Claiborne Rigg.  Diagnosis:   Chief Complaint/Diagnosis   81 year old female status post TAH/BSO for well-differentiated endometrial carcinomapathologic stage IB grade 1 endometrioid  adenocarcinoma   Pathology Report pathology report requested for my review    Imaging Report CT scan of abdomen pelvis reviewed    Referral Report clinical notes reviewed    Planned Treatment Regimen vaginal brachytherapy    HPI   patient is a 81 year old female who presented with vaginal spotting and was seen by her gynecologist. Initial biopsy was positive for endometrial carcinoma. She underwent TAH/BSO by Dr. Sabra Heck showing deep myometrial invasion. No lymph vascular invasion was noted no invasion of cervix or lower uterine segment was noted. There was positive peritoneal cytology. Tumor was well-differentiated. She tolerated her surgery well although recently has developed left lower extremity shingles. She is being started on antiviral medication for that. She's recovered well from her surgery and is now evaluated for possible postoperative radiation.  Past Hx:    Endometrial cancer:    gerd:    esophogeal stretching:    Thyroidectomy:    Tonsillectomy:   Past, Family and Social History:   Past Medical History positive    Gastrointestinal GERD    Neurological/Psychiatric CVA    Past Surgical History tonsillectomy, thyroidectomy, esophageal stricture    Past Medical History Comments arthritis    Family History positive    Family History Comments 2 aunts with breast cancer    Social History noncontributory    Additional Past Medical and Surgical History patient is gravida 2 para 2 Accompanied by daughter today.   Allergies:   Valium: Alt Ment Status  Novocain: Alt Ment Status  Sulfa drugs: Unknown  Home Meds:  Home  Medications: Medication Instructions Status  valACYclovir 500 mg oral tablet 1 tab(s) orally every 12 hours Active  Cipro 500 mg oral tablet 1 tab(s) orally every 12 hours Active  aspirin 81 mg oral tablet 1 tab(s) orally once a day Active  gas relief Equate 2   once a day Active  Centrum Performance oral tablet 1 tab(s) orally once a day Active  Prilosec 20 mg oral delayed release capsule 1 cap(s) orally once a day Active  clopidogrel 75 mg oral tablet 1 tab(s) orally once a day Active   Review of Systems:   General negative    Performance Status (ECOG) 0    Skin see HPI    Breast negative    Ophthalmologic negative    ENMT negative    Respiratory and Thorax negative    Cardiovascular negative    Gastrointestinal negative    Genitourinary see HPI    Musculoskeletal negative    Neurological negative    Psychiatric negative    Hematology/Lymphatics negative    Endocrine negative    Allergic/Immunologic negative    Review of Systems   except for history of vaginal bleeding and recent history of shingles according to the nurse's notesPatient denies any weight loss, fatigue, weakness, fever, chills or night sweats. Patient denies any loss of vision, blurred vision. Patient denies any ringing  of the ears or hearing loss. No irregular heartbeat. Patient denies heart murmur or history of fainting. Patient denies any chest pain or pain radiating to her upper extremities. Patient denies any shortness of breath, difficulty breathing at night, cough or hemoptysis. Patient denies any swelling in the  lower legs. Patient denies any nausea vomiting, vomiting of blood, or coffee ground material in the vomitus. Patient denies any stomach pain. Patient states has had normal bowel movements no significant constipation or diarrhea. Patient denies any dysuria, hematuria or significant nocturia. Patient denies any problems walking, swelling in the joints or loss of balance. Patient denies any  skin changes, loss of hair or loss of weight. Patient denies any excessive worrying or anxiety or significant depression. Patient denies any problems with insomnia. Patient denies excessive thirst, polyuria, polydipsia. Patient denies any swollen glands, patient denies easy bruising or easy bleeding. Patient denies any recent infections, allergies or URI. Patient "s visual fields have not changed significantly in recent time.  Nursing Notes:  Nursing Vital Signs and Chemo Nursing Nursing Notes: *CC Vital Signs Flowsheet:   17-Dec-13 11:35   Temp Temperature 98.7   Pulse Pulse 69   Respirations Respirations 18   SBP SBP 117   DBP DBP 73   Pain Scale (0-10)  7   Current Weight (kg) (kg) 62.5   Height (cm) centimeters 156.5   BSA (m2) 1.6   Physical Exam:  General/Skin/HEENT:   General normal    Skin normal    Eyes normal    ENMT normal    Head and Neck normal    Additional PE well-developed elderly female in NAD appears stated age. Lungs are clear to A&P cardiac examination shows regular rate and rhythm. Abdomen is benign with no organomegaly or masses noted. She has small unilateral lesions on her left side consistent with early herpes zoster.vaginal exam was performed earlier by Dr. Sabra Heck was not repeated   Breasts/Resp/CV/GI/GU:   Respiratory and Thorax normal    Cardiovascular normal    Gastrointestinal normal    Genitourinary normal   MS/Neuro/Psych/Lymph:   Musculoskeletal normal    Neurological normal    Lymphatics normal   Assessment and Plan:  Impression:   stage IB grade 1 endometrioid adenocarcinoma of the uterus status post TAH/BSO in 81 year old female.  Plan:   I discussed the case with Dr. Sabra Heck. Interesting case. She falls some more between low intermediate and high intermediate risk for recurrence. Based on her age, deep myometrial invasion and positive peritoneal cytology putting all that together I believe she is at slightly high intermediate  risk for recurrence. I would not at her age peripheral whole pelvic radiation but I believe high-dose rate remote afterloadingto the vaginal apex would be appropriate adjuvant therapy. I would plan on delivering 2350 cGy in 5 fractions using high-dose rate remote afterloading and BrachyVision treatment planning. I have discussed the risks and benefits of treatment including some vaginal stricture, possibility of dysuria and diarrhea, and fatigue with the patient and her daughter. They both seem to comprehend my treatment plan well. I will let the shingles resolved somewhat over the next 2 weeks have set her up for CT simulation approximate second week in February.  I would like to take this opportunity to thank you for allowing me to continue to participate in this patient's care.  CC Referral:   cc: Dr. Fulton Reek   Electronic Signatures: Baruch Gouty Roda Shutters (MD)  (Signed 28-Jan-14 11:40)  Authored: HPI, Diagnosis, Past Hx, PFSH, Allergies, Home Meds, ROS, Nursing Notes, Physical Exam, Encounter Assessment and Plan, CC Referring Physician   Last Updated: 28-Jan-14 11:40 by Armstead Peaks (MD)

## 2014-05-30 NOTE — Op Note (Signed)
PATIENT NAME:  Traci [name], Traci [name] MR#:  [id number] DATE OF BIRTH:  1929/09/07  DATE OF PROCEDURE:  07/24/2013  PREOPERATIVE DIAGNOSES:  1. Deep vein thrombosis with progression on followup ultrasound.  2. History of stroke.  3. History of back surgery.  4. Inability to anticoagulate with Coumadin.  POSTOPERATIVE DIAGNOSES:  1. Deep vein thrombosis with progression on followup ultrasound.  2. History of stroke.  3. History of back surgery.  4. Inability to anticoagulate with Coumadin.  PROCEDURES:  1. Ultrasound guidance of vascular access, right femoral vein.  2. Catheter placement into inferior vena cava.  3. Inferior venacavogram.  4. Placement of a Bard Denali IVC filter.   SURGEON: Algernon Huxley, MD  ANESTHESIA: Local.   ESTIMATED BLOOD LOSS: Minimal.   INDICATION FOR PROCEDURE: This is an 81 year old female with progression of a DVT from an ultrasound last month to an ultrasound in our office last week. She has been on Plavix. She has been taken care of. Her primary care physician has deemed she is not suitable for anticoagulation due to her multiple other issues and I would agree with his assessment.  Given the progression of her DVT and the inability to fully anticoagulate her with Coumadin, she should have an IVC filter and this is being placed today. Risks and benefits were discussed. Informed consent was obtained.   DESCRIPTION OF PROCEDURE: The patient is brought to the vascular suite. Groins were sterilely prepped and draped and a sterile surgical field was created. The right femoral vein was visualized with ultrasound and found to be widely patent. It was then accessed under direct ultrasound guidance without difficulty with a Seldinger needle. J-wire was then placed after skin nick and dilatation. The delivery sheath was placed into the inferior vena cava. An inferior venacavogram was then performed.  There appeared to be some narrowing of the cava at and just below the renals  at the L1 and L2 level and the filter was deployed at the top of L3. The delivery sheath was removed. Pressure was held. Sterile dressing was placed. The patient tolerated the procedure well and was taken to the recovery room in stable condition.     ____________________________ Algernon Huxley, MD jsd:dd D: 07/24/2013 14:15:05 ET T: 07/24/2013 20:18:09 ET JOB#: [id number]  cc: Algernon Huxley, MD, <Dictator> Algernon Huxley MD ELECTRONICALLY SIGNED 08/11/2013 15:05

## 2014-06-07 NOTE — Op Note (Signed)
PATIENT NAME:  Traci [name], Traci [name] MR#:  [id number] DATE OF BIRTH:  [birth year]  DATE OF PROCEDURE:  02/09/2014  PREOPERATIVE DIAGNOSIS: Status post inferior vena cava filter for progression of deep vein thrombosis and intolerance to anticoagulation.   POSTOPERATIVE DIAGNOSIS: Status post inferior vena cava filter for progression of deep vein thrombosis and intolerance to anticoagulation.   PROCEDURES:  1. Ultrasound guidance for vascular access, right jugular vein.  2. Catheter placement into inferior vena cava from right jugular venous approach.  3. Inferior venacavogram.  4. Retrieval of Bard Denali inferior vena cava filter.   SURGEON: Algernon Huxley, M.D.   ANESTHESIA: Local with moderate conscious sedation.   ESTIMATED BLOOD LOSS: Minimal.   INDICATION FOR PROCEDURE: This is an 81 year old female with a history of IVC filter placement for progression of DVT about 6 months ago. She has now only chronic findings in the lower extremities and no longer needs the filter, and desires to have this removed to avoid the risks of filter-related complications. The risks and benefits were discussed. Informed consent was obtained.   DESCRIPTION OF PROCEDURE: The patient was brought to the vascular suite. The right neck was sterilely prepped and draped and a sterile surgical field was created. The right jugular vein was visualized with ultrasound and found to be widely patent. It was then accessed under direct ultrasound guidance without difficulty with a Seldinger needle. A J-wire was then placed. After a skin nick and dilatation, the retrieval sheath was placed over the wire into the inferior vena cava, and the wire and dilator were removed. Inferior venacavogram was then performed through the sheath. This showed a patent IVC without significant thrombosis with the filter below the renal veins. I then exchanged for the retrieval snare from the kit, which did not want to snare the hook. I quickly changed for  the triple snare and was able to snare the hook on the IVC filter with a minimal amount of difficulty. The sheath was then advanced over the snare, collapsing the filter and removing it in its entirety, and pulling this out of the body. The retrieval sheath was then removed. Pressure was held. Sterile dressing was placed. The patient tolerated the procedure well.   ____________________________ Algernon Huxley, MD jsd:JT D: 02/09/2014 10:38:11 ET T: 02/09/2014 13:16:30 ET JOB#: [id number]  cc: Algernon Huxley, MD, <Dictator> Leonie Douglas. Doy Hutching, MD Algernon Huxley MD ELECTRONICALLY SIGNED 02/09/2014 14:15

## 2014-06-07 NOTE — Op Note (Signed)
PATIENT NAME:  Traci [name], Traci [name] MR#:  [id number] DATE OF BIRTH:  Jun 17, 1929  DATE OF PROCEDURE:  03/17/2014  PREOPERATIVE DIAGNOSIS:  Nuclear sclerotic cataract of the left eye.   POSTOPERATIVE DIAGNOSIS:  Nuclear sclerotic cataract of the left eye.   OPERATIVE PROCEDURE:  Cataract extraction by phacoemulsification with implant of intraocular lens to left eye.   SURGEON:  Birder Robson, MD.   ANESTHESIA:  1. Managed anesthesia care.  2. Topical tetracaine drops followed by 2% Xylocaine jelly applied in the preoperative holding area.   COMPLICATIONS:  None.   TECHNIQUE:   Stop and chop.  DESCRIPTION OF PROCEDURE:  The patient was examined and consented in the preoperative holding area where the aforementioned topical anesthesia was applied to the left eye and then brought back to the Operating Room where the left eye was prepped and draped in the usual sterile ophthalmic fashion and a lid speculum was placed. A paracentesis was created with the side port blade and the anterior chamber was filled with viscoelastic. A near clear corneal incision was performed with the steel keratome. A continuous curvilinear capsulorrhexis was performed with a cystotome followed by the capsulorrhexis forceps. Hydrodissection and hydrodelineation were carried out with BSS on a blunt cannula. The lens was removed in a stop and chop technique and the remaining cortical material was removed with the irrigation-aspiration handpiece. The capsular bag was inflated with viscoelastic and the Tecnis ZCB00, 24.0-diopter lens, serial number [id number] was placed in the capsular bag without complication. The remaining viscoelastic was removed from the eye with the irrigation-aspiration handpiece. The wounds were hydrated. The anterior chamber was flushed with Miostat and the eye was inflated to physiologic pressure. 0.1 mL of cefuroxime concentration 10 mg/mL was placed in the anterior chamber. The wounds were found to be water  tight. The eye was dressed with Vigamox. The patient was given protective glasses to wear throughout the day and a shield with which to sleep tonight. The patient was also given drops with which to begin a drop regimen today and will follow-up with me in one day.    ____________________________ Livingston Diones. Chonita Gadea, MD wlp:TM D: 03/17/2014 41:58:30 ET T: 03/17/2014 23:01:36 ET JOB#: [id number]  cc: Dimitra Woodstock L. Merrianne Mccumbers, MD, <Dictator> Livingston Diones Barnie Sopko MD ELECTRONICALLY SIGNED 03/18/2014 12:12

## 2014-08-14 ENCOUNTER — Ambulatory Visit
Admission: RE | Admit: 2014-08-14 | Discharge: 2014-08-14 | Disposition: A | Discharge status: Home / Self Care | Payer: Medicare Other | Source: Ambulatory Visit | Attending: Internal Medicine | Admitting: Internal Medicine

## 2014-08-14 ENCOUNTER — Other Ambulatory Visit: Payer: Self-pay | Admitting: Internal Medicine

## 2014-08-14 DIAGNOSIS — Z86718 Personal history of other venous thrombosis and embolism: Secondary | ICD-10-CM | POA: Insufficient documentation

## 2014-08-14 DIAGNOSIS — M79604 Pain in right leg: Secondary | ICD-10-CM

## 2014-09-04 DIAGNOSIS — M5416 Radiculopathy, lumbar region: Secondary | ICD-10-CM | POA: Insufficient documentation

## 2014-09-28 ENCOUNTER — Other Ambulatory Visit: Payer: Self-pay | Admitting: Internal Medicine

## 2014-09-28 DIAGNOSIS — Z1231 Encounter for screening mammogram for malignant neoplasm of breast: Secondary | ICD-10-CM

## 2014-09-29 ENCOUNTER — Encounter: Payer: Self-pay | Admitting: *Deleted

## 2014-09-30 ENCOUNTER — Ambulatory Visit
Admission: RE | Admit: 2014-09-30 | Discharge: 2014-09-30 | Disposition: A | Discharge status: Home / Self Care | Payer: Medicare Other | Source: Ambulatory Visit | Attending: Internal Medicine | Admitting: Internal Medicine

## 2014-09-30 ENCOUNTER — Inpatient Hospital Stay: Payer: Medicare Other | Attending: Obstetrics and Gynecology | Admitting: Obstetrics and Gynecology

## 2014-09-30 ENCOUNTER — Other Ambulatory Visit: Payer: Self-pay | Admitting: Internal Medicine

## 2014-09-30 VITALS — BP 135/62 | HR 52 | Temp 96.7°F | Resp 18 | Ht 64.0 in | Wt 130.3 lb

## 2014-09-30 DIAGNOSIS — Z1231 Encounter for screening mammogram for malignant neoplasm of breast: Secondary | ICD-10-CM | POA: Diagnosis present

## 2014-09-30 DIAGNOSIS — Z86718 Personal history of other venous thrombosis and embolism: Secondary | ICD-10-CM | POA: Insufficient documentation

## 2014-09-30 DIAGNOSIS — Z90722 Acquired absence of ovaries, bilateral: Secondary | ICD-10-CM | POA: Insufficient documentation

## 2014-09-30 DIAGNOSIS — Z8542 Personal history of malignant neoplasm of other parts of uterus: Secondary | ICD-10-CM | POA: Diagnosis not present

## 2014-09-30 DIAGNOSIS — K219 Gastro-esophageal reflux disease without esophagitis: Secondary | ICD-10-CM | POA: Diagnosis not present

## 2014-09-30 DIAGNOSIS — Z90710 Acquired absence of both cervix and uterus: Secondary | ICD-10-CM | POA: Diagnosis not present

## 2014-09-30 DIAGNOSIS — Z79899 Other long term (current) drug therapy: Secondary | ICD-10-CM | POA: Insufficient documentation

## 2014-09-30 DIAGNOSIS — Z8673 Personal history of transient ischemic attack (TIA), and cerebral infarction without residual deficits: Secondary | ICD-10-CM | POA: Diagnosis not present

## 2014-09-30 NOTE — Progress Notes (Addendum)
Gynecologic Oncology Visit   Referring Provider: Dr. Ouida Sills.  Chief Concern: endometroid adenocarcinoma, stage Ib, grade 1  Subjective:  Traci [name] is a 81 y.o. female who is seen in consultation from Dr. Ouida Sills  for endometroid adenocarcinoma, stage Ib, grade 1  Traci [name] has no gyn complaints. She previously had back surgery with decreased pain, but now she has neuropathic pain and is scheduled for another back surgery with Dr. Carloyn Manner in [place]. She has no gynecologic complaints and specifically no bleeding.  Gynecologic Oncology History 01/2012  endometroid adenocarcinoma, stage Ib, grade 1, positive peritoneal cytology, no other risk factors or extra-uterine disease,                TAHBSO and staging, lymphocyst post-op,               brachytherapy  Health maintenance: Her breast cancer screening is done by her PCP, Dr. Doy Hutching, and she is scheduled for a mammogram today.    Problem List: Patient Active Problem List   Diagnosis Date Noted  . History of endometrial cancer 09/30/2014  . Stroke 04/07/2011    Past Medical History: Past Medical History  Diagnosis Date  . History of esophageal stricture   . GERD (gastroesophageal reflux disease)   . Stroke 04/06/2011    residual:  aphasia  . Arthritis   . Endometrial adenocarcinoma 01/2012     stage Ib, grade 1, positive peritoneal cytology, no other risk factors or extra-uterine disease  . Inguinal lymphocyst   . History of brachytherapy   . History of shingles   . History of colon polyps   . Mild aphasia   . Hearing loss   . History of DVT (deep vein thrombosis)     Past Surgical History: Past Surgical History  Procedure Laterality Date  . Esophageal dilation    . Thyroidectomy, partial    . Cataract extraction w/ intraocular lens implant      right  . Total abdominal hysterectomy w/ bilateral salpingoophorectomy      staging biopsies  . Back surgery      fusion of L3&4  . Tonsillectomy       Past Gynecologic History:  See HPI  OB History:  OB History  Gravida Para Term Preterm AB SAB TAB Ectopic Multiple Living  2             # Outcome Date GA Lbr Len/2nd Weight Sex Delivery Anes PTL Lv  2 Gravida           1 Gravida               Family History: Family History  Problem Relation Age of Onset  . Stroke Mother   . Breast cancer Maternal Aunt     x 2    Social History: Social History   Social History  . Marital Status: Married    Spouse Name: N/A  . Number of Children: N/A  . Years of Education: N/A   Occupational History  . Not on file.   Social History Main Topics  . Smoking status: Never Smoker   . Smokeless tobacco: Never Used  . Alcohol Use: No  . Drug Use: No  . Sexual Activity:    Partners: Male   Other Topics Concern  . Not on file   Social History Narrative    Allergies: Allergies  Allergen Reactions  . Penicillins Rash  . Hydrocodone-Acetaminophen Nausea Only  . Novocain [Procaine] Other (See Comments)  Chest pain  . Sulfa Antibiotics Other (See Comments)    CHEST PAIN    Current Medications: Current Outpatient Prescriptions  Medication Sig Dispense Refill  . calcium carbonate (OS-CAL) 1250 MG chewable tablet Chew 1 tablet by mouth daily.    . clopidogrel (PLAVIX) 75 MG tablet Take 1 tablet by mouth daily.    Marland Kitchen gabapentin (NEURONTIN) 300 MG capsule Take 1 capsule by mouth 2 (two) times daily.    . Multiple Vitamins-Minerals (MULTIVITAMIN WITH MINERALS) tablet Take 1 tablet by mouth daily.    Marland Kitchen oxyCODONE-acetaminophen (PERCOCET/ROXICET) 5-325 MG per tablet Take 1 tablet by mouth every 6 (six) hours as needed.    . simethicone (MYLICON) 80 MG chewable tablet Chew 80 mg by mouth daily.     No current facility-administered medications for this visit.    Review of Systems General: no complaints  HEENT: no complaints  Lungs: no complaints  Cardiac: no complaints  GI: no complaints  GU: no complaints  Musculoskeletal:  back pain  Extremities: no complaints  Skin: no complaints  Neuro: neuropathic pain in her RLE due to back issues  Endocrine: no complaints  Psych: no complaints      Objective:  Physical Examination:  BP 135/62 mmHg  Pulse 52  Temp(Src) 96.7 F (35.9 C) (Tympanic)  Resp 18  Ht 5\' 4"  (1.626 m)  Wt 130 lb 4.7 oz (59.1 kg)  BMI 22.35 kg/m2   ECOG Performance Status: 1 - Symptomatic but completely ambulatory  General appearance: alert and appears stated age HEENT:PERRLA, extra ocular movement intact and sclera clear, anicteric Lymph node survey: non-palpable, axillary, inguinal, supraclavicular Cardiovascular: regular rate and rhythm Respiratory: normal air entry, lungs clear to auscultation Abdomen: soft, non-tender, without masses or organomegaly, no hernias and well healed incision Extremities: extremities normal, atraumatic, no cyanosis or edema Neurological exam reveals alert, oriented, normal speech, no focal findings  Pelvic: exam chaperoned by nurse;  Vulva: normal appearing vulva with no masses, tenderness or lesions; Vagina: agglutinated and length is only 4 cm; Adnexa: surgically absent; Uterus and Cervix: surgically absent, vaginal cuff not visualized due to agglutination;  Rectal: confirmatory    Lab Review Labs on site today: None  Radiologic Imaging: N/A    Assessment:  Traci [name] is a 81 y.o. female diagnosed with endometroid adenocarcinoma, stage Ib, grade 1, s/p brachtherapy NED. Vaginal agglutination.  Plan:   Problem List Items Addressed This Visit      Other   History of endometrial cancer - Primary     Traci [name] has done very well and is clinically NED. 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 Plan to begin alternating visits with Dr. Ouida Sills every 6 months with physical exam including pelvic exam. When it has been 5 years since her diagnosed she can be released from Gynecologic Oncology.   The patient has a normal  BMI.   Gillis Ends, MD    CC:   Dr. Blanche East, MD [address], [place] [id number] [phone]

## 2014-09-30 NOTE — Progress Notes (Signed)
Patient here today for follow up Uterine cancer. Denies any gyn concerns today

## 2014-10-01 ENCOUNTER — Other Ambulatory Visit: Payer: Self-pay | Admitting: Internal Medicine

## 2014-10-01 DIAGNOSIS — N63 Unspecified lump in unspecified breast: Secondary | ICD-10-CM

## 2014-10-01 DIAGNOSIS — R928 Other abnormal and inconclusive findings on diagnostic imaging of breast: Secondary | ICD-10-CM

## 2014-10-06 ENCOUNTER — Other Ambulatory Visit: Payer: Self-pay | Admitting: Neurosurgery

## 2014-10-06 DIAGNOSIS — M47816 Spondylosis without myelopathy or radiculopathy, lumbar region: Secondary | ICD-10-CM

## 2014-10-07 ENCOUNTER — Ambulatory Visit
Admission: RE | Admit: 2014-10-07 | Discharge: 2014-10-07 | Disposition: A | Discharge status: Home / Self Care | Payer: Medicare Other | Source: Ambulatory Visit | Attending: Internal Medicine | Admitting: Internal Medicine

## 2014-10-07 DIAGNOSIS — N63 Unspecified lump in unspecified breast: Secondary | ICD-10-CM

## 2014-10-07 DIAGNOSIS — R928 Other abnormal and inconclusive findings on diagnostic imaging of breast: Secondary | ICD-10-CM

## 2014-10-08 ENCOUNTER — Other Ambulatory Visit: Payer: Self-pay | Admitting: Internal Medicine

## 2014-10-08 DIAGNOSIS — R928 Other abnormal and inconclusive findings on diagnostic imaging of breast: Secondary | ICD-10-CM

## 2014-10-08 DIAGNOSIS — N63 Unspecified lump in unspecified breast: Secondary | ICD-10-CM

## 2014-10-10 ENCOUNTER — Other Ambulatory Visit: Payer: Medicare Other

## 2014-10-11 ENCOUNTER — Ambulatory Visit
Admission: RE | Admit: 2014-10-11 | Discharge: 2014-10-11 | Disposition: A | Discharge status: Home / Self Care | Payer: Medicare Other | Source: Ambulatory Visit | Attending: Neurosurgery | Admitting: Neurosurgery

## 2014-10-11 DIAGNOSIS — M47816 Spondylosis without myelopathy or radiculopathy, lumbar region: Secondary | ICD-10-CM

## 2014-10-13 ENCOUNTER — Ambulatory Visit
Admission: RE | Admit: 2014-10-13 | Discharge: 2014-10-13 | Disposition: A | Discharge status: Home / Self Care | Payer: Medicare Other | Source: Ambulatory Visit | Attending: Internal Medicine | Admitting: Internal Medicine

## 2014-10-13 ENCOUNTER — Ambulatory Visit: Payer: Medicare Other

## 2014-10-13 DIAGNOSIS — R928 Other abnormal and inconclusive findings on diagnostic imaging of breast: Secondary | ICD-10-CM | POA: Diagnosis present

## 2014-10-13 DIAGNOSIS — N63 Unspecified lump in unspecified breast: Secondary | ICD-10-CM

## 2014-10-13 HISTORY — PX: BREAST BIOPSY: SHX20

## 2014-10-16 ENCOUNTER — Other Ambulatory Visit: Payer: Self-pay | Admitting: Neurology

## 2014-10-16 DIAGNOSIS — R2689 Other abnormalities of gait and mobility: Secondary | ICD-10-CM

## 2014-10-16 DIAGNOSIS — Z8673 Personal history of transient ischemic attack (TIA), and cerebral infarction without residual deficits: Secondary | ICD-10-CM

## 2014-10-19 ENCOUNTER — Encounter: Payer: Self-pay | Admitting: *Deleted

## 2014-10-19 NOTE — Progress Notes (Signed)
Called patient today to introduce navigator services.  She has an appointment tomorrow with Dr. Oliva Bustard.  Will review educational literature tomorrow.

## 2014-10-20 ENCOUNTER — Inpatient Hospital Stay: Payer: Medicare Other | Attending: Oncology | Admitting: Oncology

## 2014-10-20 ENCOUNTER — Encounter: Payer: Self-pay | Admitting: *Deleted

## 2014-10-20 ENCOUNTER — Ambulatory Visit: Payer: Medicare Other

## 2014-10-20 ENCOUNTER — Inpatient Hospital Stay: Payer: Medicare Other

## 2014-10-20 ENCOUNTER — Encounter: Payer: Self-pay | Admitting: Oncology

## 2014-10-20 VITALS — BP 148/73 | HR 72 | Temp 96.9°F | Resp 18 | Ht 60.63 in | Wt 129.6 lb

## 2014-10-20 DIAGNOSIS — Z90710 Acquired absence of both cervix and uterus: Secondary | ICD-10-CM | POA: Insufficient documentation

## 2014-10-20 DIAGNOSIS — Z87718 Personal history of other specified (corrected) congenital malformations of genitourinary system: Secondary | ICD-10-CM | POA: Insufficient documentation

## 2014-10-20 DIAGNOSIS — N1330 Unspecified hydronephrosis: Secondary | ICD-10-CM | POA: Insufficient documentation

## 2014-10-20 DIAGNOSIS — Z923 Personal history of irradiation: Secondary | ICD-10-CM | POA: Diagnosis not present

## 2014-10-20 DIAGNOSIS — I209 Angina pectoris, unspecified: Secondary | ICD-10-CM | POA: Insufficient documentation

## 2014-10-20 DIAGNOSIS — M722 Plantar fascial fibromatosis: Secondary | ICD-10-CM | POA: Insufficient documentation

## 2014-10-20 DIAGNOSIS — K219 Gastro-esophageal reflux disease without esophagitis: Secondary | ICD-10-CM | POA: Insufficient documentation

## 2014-10-20 DIAGNOSIS — N134 Hydroureter: Secondary | ICD-10-CM | POA: Insufficient documentation

## 2014-10-20 DIAGNOSIS — M1990 Unspecified osteoarthritis, unspecified site: Secondary | ICD-10-CM

## 2014-10-20 DIAGNOSIS — R300 Dysuria: Secondary | ICD-10-CM | POA: Diagnosis not present

## 2014-10-20 DIAGNOSIS — Z87448 Personal history of other diseases of urinary system: Secondary | ICD-10-CM | POA: Insufficient documentation

## 2014-10-20 DIAGNOSIS — Z8673 Personal history of transient ischemic attack (TIA), and cerebral infarction without residual deficits: Secondary | ICD-10-CM | POA: Diagnosis not present

## 2014-10-20 DIAGNOSIS — C50812 Malignant neoplasm of overlapping sites of left female breast: Secondary | ICD-10-CM | POA: Diagnosis not present

## 2014-10-20 DIAGNOSIS — Z170 Estrogen receptor positive status [ER+]: Secondary | ICD-10-CM | POA: Insufficient documentation

## 2014-10-20 DIAGNOSIS — E7800 Pure hypercholesterolemia, unspecified: Secondary | ICD-10-CM | POA: Insufficient documentation

## 2014-10-20 DIAGNOSIS — Z8711 Personal history of peptic ulcer disease: Secondary | ICD-10-CM | POA: Insufficient documentation

## 2014-10-20 DIAGNOSIS — Z90722 Acquired absence of ovaries, bilateral: Secondary | ICD-10-CM | POA: Diagnosis not present

## 2014-10-20 DIAGNOSIS — D126 Benign neoplasm of colon, unspecified: Secondary | ICD-10-CM | POA: Insufficient documentation

## 2014-10-20 DIAGNOSIS — E042 Nontoxic multinodular goiter: Secondary | ICD-10-CM | POA: Insufficient documentation

## 2014-10-20 DIAGNOSIS — Z86010 Personal history of colonic polyps: Secondary | ICD-10-CM | POA: Insufficient documentation

## 2014-10-20 DIAGNOSIS — Z79899 Other long term (current) drug therapy: Secondary | ICD-10-CM

## 2014-10-20 DIAGNOSIS — Z23 Encounter for immunization: Secondary | ICD-10-CM | POA: Diagnosis not present

## 2014-10-20 DIAGNOSIS — K222 Esophageal obstruction: Secondary | ICD-10-CM | POA: Insufficient documentation

## 2014-10-20 DIAGNOSIS — I10 Essential (primary) hypertension: Secondary | ICD-10-CM | POA: Insufficient documentation

## 2014-10-20 DIAGNOSIS — G4730 Sleep apnea, unspecified: Secondary | ICD-10-CM

## 2014-10-20 DIAGNOSIS — C7911 Secondary malignant neoplasm of bladder: Secondary | ICD-10-CM | POA: Diagnosis not present

## 2014-10-20 DIAGNOSIS — M5137 Other intervertebral disc degeneration, lumbosacral region: Secondary | ICD-10-CM | POA: Insufficient documentation

## 2014-10-20 DIAGNOSIS — Z86711 Personal history of pulmonary embolism: Secondary | ICD-10-CM | POA: Insufficient documentation

## 2014-10-20 DIAGNOSIS — N39490 Overflow incontinence: Secondary | ICD-10-CM | POA: Insufficient documentation

## 2014-10-20 DIAGNOSIS — E039 Hypothyroidism, unspecified: Secondary | ICD-10-CM | POA: Insufficient documentation

## 2014-10-20 DIAGNOSIS — R109 Unspecified abdominal pain: Secondary | ICD-10-CM | POA: Diagnosis not present

## 2014-10-20 DIAGNOSIS — Z8719 Personal history of other diseases of the digestive system: Secondary | ICD-10-CM | POA: Insufficient documentation

## 2014-10-20 DIAGNOSIS — Z8542 Personal history of malignant neoplasm of other parts of uterus: Secondary | ICD-10-CM | POA: Diagnosis not present

## 2014-10-20 DIAGNOSIS — N6019 Diffuse cystic mastopathy of unspecified breast: Secondary | ICD-10-CM | POA: Insufficient documentation

## 2014-10-20 DIAGNOSIS — G4710 Hypersomnia, unspecified: Secondary | ICD-10-CM | POA: Insufficient documentation

## 2014-10-20 DIAGNOSIS — C50912 Malignant neoplasm of unspecified site of left female breast: Secondary | ICD-10-CM | POA: Insufficient documentation

## 2014-10-20 DIAGNOSIS — R198 Other specified symptoms and signs involving the digestive system and abdomen: Secondary | ICD-10-CM | POA: Insufficient documentation

## 2014-10-20 DIAGNOSIS — R569 Unspecified convulsions: Secondary | ICD-10-CM | POA: Insufficient documentation

## 2014-10-20 LAB — URINALYSIS COMPLETE WITH MICROSCOPIC (ARMC ONLY)
BACTERIA UA: NONE SEEN
Bilirubin Urine: NEGATIVE
GLUCOSE, UA: NEGATIVE mg/dL
HGB URINE DIPSTICK: NEGATIVE
Ketones, ur: NEGATIVE mg/dL
Leukocytes, UA: NEGATIVE
Nitrite: NEGATIVE
Protein, ur: NEGATIVE mg/dL
Specific Gravity, Urine: 1.013 (ref 1.005–1.030)
pH: 6 (ref 5.0–8.0)

## 2014-10-20 MED ORDER — INFLUENZA VAC SPLIT QUAD 0.5 ML IM SUSY
0.5000 mL | PREFILLED_SYRINGE | Freq: Once | INTRAMUSCULAR | Status: AC
  Administered 2014-10-20: 0.5 mL via INTRAMUSCULAR

## 2014-10-20 NOTE — Progress Notes (Signed)
[place] @ [facility] Telephone:([phone]) [phone]  Fax:(336) [phone]   INITIAL CONSULT  Traci [name] OB: August 17, 1929  MR#: [id number]  SEG#:[id number]  Patient Care Team: Idelle Crouch, MD as PCP - General (Unknown Physician Specialty) [name], MD as Referring Physician (Surgery)  CHIEF COMPLAINT:  Chief Complaint  Patient presents with  . Breast Cancer    New Consult for recently diagnosed Breast Ca.  1/  VISIT DIAGNOSIS:     ICD-9-CM ICD-10-CM   1. Cancer of left breast 174.9 C50.912 docusate (COLACE) 50 MG/5ML liquid     gabapentin (NEURONTIN) 300 MG capsule     Urine culture     Urinalysis complete, with microscopic [facility])    Oncology History   1.  Carcinoma of left breast  IMPRESSION: Ultrasound-guided biopsy of a suspicious left breast mass at 9 o'clock. No apparent complications.   2.  Estrogen receptor positive.  Progesterone receptor positive.  HER-2 receptor 2+ by  I[facility] . Fish is pending  5 mm tumor clinically stage IB N0 M0 tumor   3.  Patient has a previous history of endometrium  carcinoma of endometrium stage IB disease status post bilateral salpingo-oophorectomy and radiation therapy  Oncology Flowsheet 04/08/2011  enoxaparin (LOVENOX) [place] 40 mg    INTERVAL HISTORY:  81 year old lady was getting regular mammogram done.  Patient had abnormal mammogram initial biopsies showed 5 mm tumor at 9:00 position in the left breast.  Biopsy of which is positive for invasive carcinoma patient was referred to me for preop treatment planning.  Patient had a previous history of carcinoma of endometrium.  Patient is not on any anti-hormonal therapy.  And does not have any significant family history of breast cancer.  REVIEW OF SYSTEMS:   GENERAL:  Feels good.  Active.  No fevers, sweats or weight loss. PERFORMANCE STATUS (ECOG):  01 HEENT:  No visual changes, runny nose, sore throat, mouth sores or tenderness. Lungs: No shortness of breath or cough.  No  hemoptysis. Cardiac:  No chest pain, palpitations, orthopnea, or PND. GI:  No nausea, vomiting, diarrhea, constipation, melena or hematochezia. GU: Patient had a left flank pain.  Has some dysuria and burning but no hematuria.  Is going on for 2 weeks Musculoskeletal:  No back pain.  No joint pain.  No muscle tenderness. Extremities:  No pain or swelling. Skin:  No rashes or skin changes. Neuro:  No headache, numbness or weakness, balance or coordination issues. Endocrine:  No diabetes, thyroid issues, hot flashes or night sweats. Psych:  No mood changes, depression or anxiety. Pain:  No focal pain. Review of systems:  All other systems reviewed and found to be negative.  As per HPI. Otherwise, a complete review of systems is negatve.  PAST MEDICAL HISTORY: Past Medical History  Diagnosis Date  . History of esophageal stricture   . GERD (gastroesophageal reflux disease)   . Stroke 04/06/2011    residual:  aphasia  . Arthritis   . Endometrial adenocarcinoma 01/2012     stage Ib, grade 1, positive peritoneal cytology, no other risk factors or extra-uterine disease  . Inguinal lymphocyst   . History of brachytherapy   . History of shingles   . History of colon polyps   . Mild aphasia   . Hearing loss   . History of DVT (deep vein thrombosis)   . Cancer of left breast 10/20/2014    PAST SURGICAL HISTORY: Past Surgical History  Procedure Laterality Date  . Esophageal dilation    .  Thyroidectomy, partial    . Cataract extraction w/ intraocular lens implant      right  . Back surgery      fusion of L3&4  . Tonsillectomy    . Total abdominal hysterectomy w/ bilateral salpingoophorectomy Bilateral     staging biopsies  . Breast cyst aspiration N/A   . Breast biopsy Left 10/13/2014    path pending    FAMILY HISTORY Family History  Problem Relation Age of Onset  . Stroke Mother   . Breast cancer Maternal Aunt     x 2        ADVANCED DIRECTIVES:  Patient does not have  any living will or healthcare power of attorney.  Information was given .  Available resources had been discussed.  We will follow-up on subsequent appointments regarding this issue  HEALTH MAINTENANCE: Social History  Substance Use Topics  . Smoking status: Never Smoker   . Smokeless tobacco: Never Used  . Alcohol Use: No      Allergies  Allergen Reactions  . Penicillins Rash  . Hydrocodone-Acetaminophen Nausea Only  . Novocain [Procaine] Other (See Comments)    Chest pain  . Sulfa Antibiotics Other (See Comments)    CHEST PAIN    Current Outpatient Prescriptions  Medication Sig Dispense Refill  . calcium carbonate (OS-CAL) 1250 MG chewable tablet Chew 1 tablet by mouth daily.    . clopidogrel (PLAVIX) 75 MG tablet Take 1 tablet by mouth daily.    Marland Kitchen docusate (COLACE) 50 MG/5ML liquid Take 100 mg by mouth at bedtime as needed.    . gabapentin (NEURONTIN) 300 MG capsule Take 1 capsule by mouth 2 (two) times daily.    . Multiple Vitamins-Minerals (MULTIVITAMIN WITH MINERALS) tablet Take 1 tablet by mouth daily.    Marland Kitchen oxyCODONE-acetaminophen (PERCOCET/ROXICET) 5-325 MG per tablet Take 1 tablet by mouth every 6 (six) hours as needed.    . simethicone (MYLICON) 80 MG chewable tablet Chew 80 mg by mouth daily.    Marland Kitchen gabapentin (NEURONTIN) 300 MG capsule Take 300 mg by mouth 2 (two) times daily.     No current facility-administered medications for this visit.   Facility-Administered Medications Ordered in Other Visits  Medication Dose Route Frequency Provider Last Rate Last Dose  . Influenza vac split quadrivalent PF (FLUARIX) injection 0.5 mL  0.5 mL Intramuscular Once Forest Gleason, MD        OBJECTIVE: PHYSICAL EXAM: GENERAL:  Well developed, well nourished, sitting comfortably in the exam room in no acute distress. MENTAL STATUS:  Alert and oriented to person, place and time.  ENT:  Oropharynx clear without lesion.  Tongue normal. Mucous membranes moist.  RESPIRATORY:  Clear  to auscultation without rales, wheezes or rhonchi. CARDIOVASCULAR:  Regular rate and rhythm without murmur, rub or gallop. BREAST:  Right breast without masses, skin changes or nipple discharge.  .  Left breast inner and upper quadrant patient had ecchymosis from previous biopsy.  No palpable masses so no palpable lymphadenopathy ABDOMEN:  Soft, non-tender, with active bowel sounds, and no hepatosplenomegaly.  No masses. BACK:   CVA tenderness.  No tenderness on percussion of the back or rib cage. SKIN:  No rashes, ulcers or lesions. EXTREMITIES: No edema, no skin discoloration or tenderness.  No palpable cords. LYMPH NODES: No palpable cervical, supraclavicular, axillary or inguinal adenopathy  NEUROLOGICAL: Unremarkable. PSYCH:  Appropriate.  Filed Vitals:   10/20/14 0837  BP: 148/73  Pulse: 72  Temp: 96.9 F (36.1 C)  Resp:  18     Body mass index is 24.79 kg/(m^2).    ECOG FS:1 - Symptomatic but completely ambulatory  LAB RESULTS:     STUDIES: Mr Lumbar Spine Wo Contrast  10/11/2014   CLINICAL DATA:  Lumbar spondylosis. Right leg pain and weakness. History of uterine cancer 2013  EXAM: MRI LUMBAR SPINE WITHOUT CONTRAST  TECHNIQUE: Multiplanar, multisequence MR imaging of the lumbar spine was performed. No intravenous contrast was administered.  COMPARISON:  None.  FINDINGS: Severe right hydronephrosis with atrophy of the right renal cortex. There is dilatation of the right ureter suggesting distal obstruction. No obstruction of the left kidney.  Negative for fracture. Conus medullaris is normal and terminates at L1-2  T12-L1:  Moderate disc degeneration without stenosis  L1-2: Extruded disc fragment on the left with upgoing disc material. No significant nerve root compression or spinal stenosis. Mild facet degeneration.  L2-3: Mild retrolisthesis. Moderate disc degeneration. Mild facet degeneration without significant spinal stenosis  L3-4: Pedicle screw and interbody fusion. Bilateral  facet hypertrophy. Disc degeneration and spurring. Mild narrowing of the canal without significant spinal or foraminal stenosis.  L4-5: Mild disc bulging and mild facet hypertrophy without significant spinal stenosis.  L5-S1: Moderate disc degeneration with disc space narrowing and spondylosis. No significant spinal or foraminal stenosis.  IMPRESSION: Severe right hydronephrosis and hydroureter. Further evaluation is recommended to determine the cause of obstruction. No prior imaging available. CT abdomen pelvis with contrast suggested. The patient has a history of uterine cancer and stricture or tumor could be present causing obstruction of the distal right ureter.  Extruded disc fragment L1-2 with cranial migration of disc material. No significant nerve root impingement  Disc and facet degeneration L2-3 without stenosis  Pedicle screw and interbody fusion L3-4 without significant spinal stenosis.  Disc and facet degeneration L4-5 and L5-S1 without significant stenosis.   Electronically Signed   By: Franchot Gallo M.D.   On: 10/11/2014 13:32   Mm Digital Diagnostic Unilat L  10/13/2014   CLINICAL DATA:  81 year old female status post ultrasound-guided left breast biopsy  EXAM: DIAGNOSTIC LEFT MAMMOGRAM POST ULTRASOUND BIOPSY  COMPARISON:  Previous exam(s).  FINDINGS: Mammographic images were obtained following ultrasound guided biopsy of a suspicious left breast mass at 9 o'clock, 4 cm from the nipple. Post biopsy mammogram demonstrates the wing shaped biopsy marker in the expected location within the medial left breast.  IMPRESSION: Satisfactory marker placement post ultrasound-guided left breast biopsy.  Final Assessment: Post Procedure Mammograms for Marker Placement   Electronically Signed   By: Pamelia Hoit M.D.   On: 10/13/2014 11:50   Mm Digital Screening Bilateral  09/30/2014   CLINICAL DATA:  Screening.  EXAM: DIGITAL SCREENING BILATERAL MAMMOGRAM WITH CAD  COMPARISON:  Previous exam(s).  ACR Breast  Density Category c: The breast tissue is heterogeneously dense, which may obscure small masses.  FINDINGS: In the left breast, a possible mass warrants further evaluation. In the right breast, no findings suspicious for malignancy.  Images were processed with CAD.  IMPRESSION: Further evaluation is suggested for possible mass in the left breast.  RECOMMENDATION: Diagnostic mammogram and possibly ultrasound of the left breast. (Code:[id number]-[id number])  The patient will be contacted regarding the findings, and additional imaging will be scheduled.  BI-RADS CATEGORY  0: Incomplete. Need additional imaging evaluation and/or prior mammograms for comparison.   Electronically Signed   By: Lovey Newcomer M.D.   On: 09/30/2014 12:41   US Breast Ltd Uni Left Inc Axilla  10/07/2014  CLINICAL DATA:  Patient was called back from screening mammogram for a possible mass in the left breast.  EXAM: DIGITAL DIAGNOSTIC LEFT MAMMOGRAM WITH 3D TOMOSYNTHESIS WITH CAD  ULTRASOUND LEFT BREAST  COMPARISON:  Previous exam(s).  ACR Breast Density Category c: The breast tissue is heterogeneously dense, which may obscure small masses.  FINDINGS: Additional imaging of the left breast was performed. There is persistence of a 7 mm spiculated mass in the medial aspect of the breast. There are no malignant type microcalcifications.  Mammographic images were processed with CAD.  On physical exam, I do not palpate a mass in medial aspect of the left breast.  Targeted ultrasound is performed, showing 5 x 4 x 5 mm hypoechoic mass in the left breast at 9 o'clock 4 cm from the nipple. Sonographic evaluation of the left axilla does not show any enlarged adenopathy.  IMPRESSION: Suspicious left breast mass.  RECOMMENDATION: Ultrasound-guided core biopsy of the left breast mass is recommended. The patient is currently on Plavix and states that she usually discontinues the medication 5 days prior to a procedure.  I have discussed the findings and recommendations with  the patient. Results were also provided in writing at the conclusion of the visit. If applicable, a reminder letter will be sent to the patient regarding the next appointment.  BI-RADS CATEGORY  4: Suspicious.   Electronically Signed   By: Baird Lyons M.D.   On: 10/07/2014 16:03   Mm Diag Breast Tomo Uni Left  10/07/2014   CLINICAL DATA:  Patient was called back from screening mammogram for a possible mass in the left breast.  EXAM: DIGITAL DIAGNOSTIC LEFT MAMMOGRAM WITH 3D TOMOSYNTHESIS WITH CAD  ULTRASOUND LEFT BREAST  COMPARISON:  Previous exam(s).  ACR Breast Density Category c: The breast tissue is heterogeneously dense, which may obscure small masses.  FINDINGS: Additional imaging of the left breast was performed. There is persistence of a 7 mm spiculated mass in the medial aspect of the breast. There are no malignant type microcalcifications.  Mammographic images were processed with CAD.  On physical exam, I do not palpate a mass in medial aspect of the left breast.  Targeted ultrasound is performed, showing 5 x 4 x 5 mm hypoechoic mass in the left breast at 9 o'clock 4 cm from the nipple. Sonographic evaluation of the left axilla does not show any enlarged adenopathy.  IMPRESSION: Suspicious left breast mass.  RECOMMENDATION: Ultrasound-guided core biopsy of the left breast mass is recommended. The patient is currently on Plavix and states that she usually discontinues the medication 5 days prior to a procedure.  I have discussed the findings and recommendations with the patient. Results were also provided in writing at the conclusion of the visit. If applicable, a reminder letter will be sent to the patient regarding the next appointment.  BI-RADS CATEGORY  4: Suspicious.   Electronically Signed   By: Baird Lyons M.D.   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 This was determined to be concordant by Dr. Clelia Croft. The patient was contacted with the results on 10/14/2014 by Coralee Pesa, CMA on 10/14/2014. The patient has been referred to oncology at [facility] by Dr. Aram Beecham.   Electronically Signed   By: Frederico Hamman M.D.   On: 10/15/2014 14:58   10/15/2014  CLINICAL DATA:  81 year old female with suspicious left breast mass at 9 o'clock  EXAM: ULTRASOUND GUIDED LEFT BREAST CORE NEEDLE BIOPSY  COMPARISON:  Previous exam(s).  PROCEDURE: I met with the patient and we discussed the procedure of ultrasound-guided biopsy, including benefits and alternatives. We discussed the high likelihood of a successful procedure. We discussed the risks of the procedure including infection, bleeding, tissue injury, clip migration, and inadequate sampling. Informed written consent was given. The usual time-out protocol was performed immediately prior to the procedure.  Using sterile technique and 1% Lidocaine as local anesthetic, under direct ultrasound visualization, a 12 gauge vacuum-assisted device was used to perform biopsy of a suspicious left breast mass at 9 o'clock, 4 cm from the nipple using a medial to lateral approach. The patient reported a remote history of having passed out in the past after being given Novocain. The patient tolerated lidocaine without complication. At the conclusion of the procedure, a wing shaped tissue marker clip was deployed into the biopsy cavity. Follow-up 2-view mammogram was performed and dictated separately.  IMPRESSION: Ultrasound-guided biopsy of a suspicious left breast mass at 9 o'clock. No apparent complications.  Electronically Signed: By: Pamelia Hoit M.D. On: 10/13/2014 11:51    ASSESSMENT:  Carcinoma of left breast at 9:00 position clinically stage is T1b N0 M0 tumor estrogen receptor positive progesterone receptor positive HER-2/neu receptor pending by fish PLAN:  discussed in detail possibility of further local  therapy.  Which includes either lumpectomy sentinel lymph node evaluation versus mastectomy.  Difference between these 2 procedure which are equal in the result had been discussed.  Need for radiation therapy if lumpectomies done has been discussed. Unless we find a bigger tumor or HER-2 positive tumor or positive lymph node Patient is not going to need any chemotherapy. Patient will need and that hormonal therapy and that has been discussed with the patient. 2.  Has a history of carcinoma of endometrium 3.  Patient also complains of dysuria and flank pain urine analysis is being done and if it is abnormal Cipro 500 mg every 12 hourly for 7 days will be started. 4.  Patient desires flu vaccine which has been given. Patient has been referred to Dr. Rochel Brome 7 will be evaluated by me and Dr. Donella Stade after definitive surgical treatment  Patient expressed understanding and was in agreement with this plan. She also understands that She can call clinic at any time with any questions, concerns, or complaints.    Cancer of left breast   Staging form: Breast, AJCC 7th Edition     Clinical: Stage IA (T1b, N0, M0) - Signed by Forest Gleason, MD on 10/20/2014   Forest Gleason, MD   10/20/2014 9:09 AM

## 2014-10-20 NOTE — Progress Notes (Signed)
  Oncology Nurse Navigator Documentation  Referral date to RadOnc/MedOnc: 10/20/14 (10/20/14 0900) Navigator Encounter Type: Initial MedOnc (10/20/14 0900) Patient Visit Type: Initial (10/20/14 0900)   Barriers/Navigation Needs: Education (10/20/14 0900)     Met patient, her husband and son during her initial medical oncology visit with Dr. Oliva Bustard.  Gave patient breast cancer educational literature, "My Breast Cancer Treatment Handbook" by Josephine Igo, RN.  Offered support.  Scheduled patient appointment to see Dr. Tamala Julian on Friday, September 15th at 10:00 to discuss surgical plan.  Reviewed plan and answered questions.  I will schedule patient to return to see Dr. Oliva Bustard about a week after her surgery.  She is to call and let me know her surgery date.  She is agreeable.            Time Spent with Patient: 60 (10/20/14 0900)

## 2014-10-21 ENCOUNTER — Telehealth: Payer: Self-pay | Admitting: *Deleted

## 2014-10-21 DIAGNOSIS — C50912 Malignant neoplasm of unspecified site of left female breast: Secondary | ICD-10-CM

## 2014-10-21 LAB — URINE CULTURE: CULTURE: NO GROWTH

## 2014-10-21 MED ORDER — CIPROFLOXACIN HCL 500 MG PO TABS: 500.0000 mg | ORAL_TABLET | Freq: Two times a day (BID) | ORAL | Status: DC

## 2014-10-21 NOTE — Telephone Encounter (Signed)
cipro 500mg  q12hrs x 7 days escribed to pharmacy.

## 2014-10-21 NOTE — Telephone Encounter (Signed)
Called and informed patient that her urine culture was normal, but Dr. Oliva Bustard was calling in a prescription for Cipro as a preventative measure.  States "I am miserable".  States she is having leg pain and rates a 9-10 on a 0/10 scale.  States she does get some relief with her pain meds.  Encouraged to take her pain meds as prescribed and if not better she should follow-up with her back doctor.  States she is seeing Dr. Tamala Julian tomorrow and a kidney doctor on Friday.  She is to call with any questions or needs.

## 2014-10-21 NOTE — Addendum Note (Signed)
Addended by: Telford Nab on: 10/21/2014 08:37 AM   Modules accepted: Orders

## 2014-10-23 ENCOUNTER — Ambulatory Visit (INDEPENDENT_AMBULATORY_CARE_PROVIDER_SITE_OTHER): Payer: Medicare Other | Admitting: Urology

## 2014-10-23 ENCOUNTER — Encounter: Payer: Self-pay | Admitting: Urology

## 2014-10-23 VITALS — BP 120/66 | HR 70 | Ht 61.0 in | Wt 129.6 lb

## 2014-10-23 DIAGNOSIS — N1339 Other hydronephrosis: Secondary | ICD-10-CM

## 2014-10-23 NOTE — Progress Notes (Signed)
10/23/2014 4:08 PM   Traci [name] 22-Jan-1930 [id number]  Referring Millie Forde: Idelle Crouch, MD [place], [place] [address]  Chief Complaint  Patient presents with  . Hydronephrosis    possible blockage to the right kidney     HPI: The patient is a 81 year old female with past history significant for cervical cancer found to have severe right hydroureteronephrosis found incidentally on a spinal MRI.  The patient also reports pain that radiates from her back down the backside of her leg. Genitourinary symptoms that she is being treated for which he leads a UTI with Cipro at this time. She also has breast cancer, and her surgeon would like for her hydronephrosis corrected prior to this. Of note she is on aspirin and Plavix. She she underwent a total abdominal hysterectomy for cervical cancer 3 years ago. Of note she did receive radiation that time.   PMH: Past Medical History  Diagnosis Date  . History of esophageal stricture   . GERD (gastroesophageal reflux disease)   . Stroke 04/06/2011    residual:  aphasia  . Arthritis   . Endometrial adenocarcinoma 01/2012     stage Ib, grade 1, positive peritoneal cytology, no other risk factors or extra-uterine disease  . Inguinal lymphocyst   . History of brachytherapy   . History of shingles   . History of colon polyps   . Mild aphasia   . Hearing loss   . History of DVT (deep vein thrombosis)   . Cancer of left breast 10/20/2014    Surgical History: Past Surgical History  Procedure Laterality Date  . Esophageal dilation    . Thyroidectomy, partial    . Cataract extraction w/ intraocular lens implant      right  . Back surgery      fusion of L3&4  . Tonsillectomy    . Total abdominal hysterectomy w/ bilateral salpingoophorectomy Bilateral     staging biopsies  . Breast cyst aspiration N/A   . Breast biopsy Left 10/13/2014    path pending    Home Medications:    Medication List       This  list is accurate as of: 10/23/14  4:08 PM.  Always use your most recent med list.               calcium carbonate 1250 (500 CA) MG chewable tablet  Commonly known as:  OS-CAL  Chew 1 tablet by mouth daily.     ciprofloxacin 500 MG tablet  Commonly known as:  CIPRO  Take 1 tablet (500 mg total) by mouth 2 (two) times daily.     clopidogrel 75 MG tablet  Commonly known as:  PLAVIX  Take 1 tablet by mouth daily.     docusate 50 MG/5ML liquid  Commonly known as:  COLACE  Take 100 mg by mouth at bedtime as needed.     gabapentin 300 MG capsule  Commonly known as:  NEURONTIN  Take 1 capsule by mouth 2 (two) times daily.     multivitamin with minerals tablet  Take 1 tablet by mouth daily.     oxyCODONE-acetaminophen 5-325 MG per tablet  Commonly known as:  PERCOCET/ROXICET  Take 1 tablet by mouth every 6 (six) hours as needed.     simethicone 80 MG chewable tablet  Commonly known as:  MYLICON  Chew 80 mg by mouth daily.        Allergies:  Allergies  Allergen Reactions  . Penicillins Rash  .  Hydrocodone-Acetaminophen Nausea Only  . Novocain [Procaine] Other (See Comments) and Nausea And Vomiting    Chest pain  . Sulfa Antibiotics Other (See Comments)    CHEST PAIN    Family History: Family History  Problem Relation Age of Onset  . Stroke Mother   . Breast cancer Maternal Aunt     x 2    Social History:  reports that she has never smoked. She has never used smokeless tobacco. She reports that she does not drink alcohol or use illicit drugs.  ROS: UROLOGY Frequent Urination?: Yes Hard to postpone urination?: No Burning/pain with urination?: No Get up at night to urinate?: No Leakage of urine?: Yes Urine stream starts and stops?: No Trouble starting stream?: No Do you have to strain to urinate?: No Blood in urine?: No Urinary tract infection?: No Sexually transmitted disease?: No Injury to kidneys or bladder?: No Painful intercourse?: No Weak stream?:  No Currently pregnant?: No Vaginal bleeding?: No Last menstrual period?: No  Gastrointestinal Nausea?: No Vomiting?: No Indigestion/heartburn?: No Diarrhea?: No Constipation?: No  Constitutional Fever: No Night sweats?: No Weight loss?: No Fatigue?: No  Skin Skin rash/lesions?: No Itching?: No  Eyes Blurred vision?: No Double vision?: No  Ears/Nose/Throat Sore throat?: No Sinus problems?: No  Hematologic/Lymphatic Swollen glands?: No Easy bruising?: No  Cardiovascular Leg swelling?: No Chest pain?: No  Respiratory Cough?: No Shortness of breath?: No  Endocrine Excessive thirst?: No  Musculoskeletal Back pain?: Yes Joint pain?: No  Neurological Headaches?: No Dizziness?: No  Psychologic Depression?: Yes Anxiety?: No  Physical Exam: BP 120/66 mmHg  Pulse 70  Ht 5\' 1"  (1.549 m)  Wt 129 lb 9.6 oz (58.786 kg)  BMI 24.50 kg/m2  Constitutional:  Alert and oriented, No acute distress. HEENT: [place] AT, moist mucus membranes.  Trachea midline, no masses. Cardiovascular: No clubbing, cyanosis, or edema. Respiratory: Normal respiratory effort, no increased work of breathing. GI: Abdomen is soft, nontender, nondistended, no abdominal masses GU: No CVA tenderness.  Skin: No rashes, bruises or suspicious lesions. Lymph: No cervical or inguinal adenopathy. Neurologic: Grossly intact, no focal deficits, moving all 4 extremities. Psychiatric: Normal mood and affect.  Laboratory Data: Lab Results  Component Value Date   WBC 5.6 11/04/2013   HGB 14.0 11/04/2013   HCT 43.3 11/04/2013   MCV 93 11/04/2013   PLT 241 11/04/2013    Lab Results  Component Value Date   CREATININE 0.90 11/04/2013    No results found for: PSA  No results found for: TESTOSTERONE  Lab Results  Component Value Date   HGBA1C 5.5 04/08/2011    Urinalysis    Component Value Date/Time   COLORURINE YELLOW* 10/20/2014 0936   COLORURINE Yellow 03/29/2012 1930   APPEARANCEUR  CLEAR* 10/20/2014 0936   APPEARANCEUR Hazy 03/29/2012 1930   LABSPEC 1.013 10/20/2014 0936   LABSPEC 1.023 03/29/2012 1930   PHURINE 6.0 10/20/2014 0936   PHURINE 7.0 03/29/2012 1930   GLUCOSEU NEGATIVE 10/20/2014 0936   GLUCOSEU 50 mg/dL 03/29/2012 1930   HGBUR NEGATIVE 10/20/2014 0936   HGBUR Negative 03/29/2012 1930   BILIRUBINUR NEGATIVE 10/20/2014 0936   BILIRUBINUR Negative 03/29/2012 1930   KETONESUR NEGATIVE 10/20/2014 0936   KETONESUR 1+ 03/29/2012 1930   PROTEINUR NEGATIVE 10/20/2014 0936   PROTEINUR Negative 03/29/2012 1930   NITRITE NEGATIVE 10/20/2014 0936   NITRITE Negative 03/29/2012 1930   LEUKOCYTESUR NEGATIVE 10/20/2014 0936   LEUKOCYTESUR Negative 03/29/2012 1930    Pertinent Imaging:  Study Result  CLINICAL DATA: Lumbar spondylosis. Right leg pain and weakness. History of uterine cancer 2013  EXAM: MRI LUMBAR SPINE WITHOUT CONTRAST  TECHNIQUE: Multiplanar, multisequence MR imaging of the lumbar spine was performed. No intravenous contrast was administered.  COMPARISON: None.  FINDINGS: Severe right hydronephrosis with atrophy of the right renal cortex. There is dilatation of the right ureter suggesting distal obstruction. No obstruction of the left kidney.  Negative for fracture. Conus medullaris is normal and terminates at L1-2  T12-L1: Moderate disc degeneration without stenosis  L1-2: Extruded disc fragment on the left with upgoing disc material. No significant nerve root compression or spinal stenosis. Mild facet degeneration.  L2-3: Mild retrolisthesis. Moderate disc degeneration. Mild facet degeneration without significant spinal stenosis  L3-4: Pedicle screw and interbody fusion. Bilateral facet hypertrophy. Disc degeneration and spurring. Mild narrowing of the canal without significant spinal or foraminal stenosis.  L4-5: Mild disc bulging and mild facet hypertrophy without significant spinal stenosis.  L5-S1:  Moderate disc degeneration with disc space narrowing and spondylosis. No significant spinal or foraminal stenosis.  IMPRESSION: Severe right hydronephrosis and hydroureter. Further evaluation is recommended to determine the cause of obstruction. No prior imaging available. CT abdomen pelvis with contrast suggested. The patient has a history of uterine cancer and stricture or tumor could be present causing obstruction of the distal right ureter.  Extruded disc fragment L1-2 with cranial migration of disc material. No significant nerve root impingement  Disc and facet degeneration L2-3 without stenosis  Pedicle screw and interbody fusion L3-4 without significant spinal stenosis.  Disc and facet degeneration L4-5 and L5-S1 without significant stenosis.     Assessment & Plan:   The patient has chronic right hydronephrosis. This is likely been present for a long time. I suspect it's related to radiation that she had for her cervical cancer. Nonetheless, it does need to be addressed. Her breast surgeon would like this to be corrected as soon as possible so he can remove her breast cancer. We will attempt to have her added on next week for a cystoscopy stent. She is aware that if this is not possible an antegrade approach with interventional radiology may be necessary.   1. Chronic Right Hydronephrosis Follow up: cysto, stent next week  [name], [facility] [address], [place], [place] [address] [phone]

## 2014-10-25 LAB — CULTURE, URINE COMPREHENSIVE

## 2014-10-26 ENCOUNTER — Encounter: Payer: Self-pay | Admitting: *Deleted

## 2014-10-26 ENCOUNTER — Other Ambulatory Visit: Payer: Medicare Other

## 2014-10-26 ENCOUNTER — Telehealth: Payer: Self-pay | Admitting: Urology

## 2014-10-26 ENCOUNTER — Ambulatory Visit: Payer: Medicare Other | Admitting: Physical Therapy

## 2014-10-26 LAB — SURGICAL PATHOLOGY

## 2014-10-26 NOTE — OR Nursing (Signed)
CLEARED LOW RISK BY DR Doy Hutching

## 2014-10-26 NOTE — Patient Instructions (Signed)
  Your procedure is scheduled on: 10/28/14 Report to Day Surgery.[facility]LO[facility] To find out your arrival time please call [phone] between 1PM - 3PM on 10/27/14.  Remember: Instructions that are not followed completely may result in serious medical risk, up to and including death, or upon the discretion of your surgeon and anesthesiologist your surgery may need to be rescheduled.    __X__ 1. Do not eat food or drink liquids after midnight. No gum chewing or hard candies.     _X___ 2. No Alcohol for 24 hours before or after surgery.   ____ 3. Bring all medications with you on the day of surgery if instructed.    __X__ 4. Notify your doctor if there is any change in your medical condition     (cold, fever, infections).     Do not wear jewelry, make-up, hairpins, clips or nail polish.  Do not wear lotions, powders, or perfumes. You may wear deodorant.  Do not shave 48 hours prior to surgery. Men may shave face and neck.  Do not bring valuables to the hospital.    [facility] is not responsible for any belongings or valuables.               Contacts, dentures or bridgework may not be worn into surgery.  Leave your suitcase in the car. After surgery it may be brought to your room.  For patients admitted to the hospital, discharge time is determined by your                treatment team.   Patients discharged the day of surgery will not be allowed to drive home.   Please read over the following fact sheets that you were given:   Surgical Site Infection Prevention   ____ Take these medicines the morning of surgery with A SIP OF WATER:    1. NEURONTIN  2.   3.   4.  5.  6.  ____ Fleet Enema (as directed)   ____ Use CHG Soap as directed  ____ Use inhalers on the day of surgery  ____ Stop metformin 2 days prior to surgery    ____ Take 1/2 of usual insulin dose the night before surgery and none on the morning of surgery.   __X__ Stop Coumadin/Plavix/aspirin on   PLAVIX STOPPED 10/26/14  ____ Stop Anti-inflammatories on   ____ Stop supplements until after surgery.    ____ Bring C-Pap to the hospital.

## 2014-10-26 NOTE — Telephone Encounter (Signed)
Patient was notified of surgery date of 10-28-14 and to call the day before and be NPO from midnight on.  Per Dr. Doy Hutching office patient is ok to stop Plavix today til surgery. Patient was notified of this and verbalized understanding.  A copy of the clearance is scanned into the chart.

## 2014-10-27 ENCOUNTER — Encounter: Payer: Self-pay | Admitting: Pathology

## 2014-10-28 ENCOUNTER — Ambulatory Visit: Payer: Medicare Other | Admitting: Certified Registered Nurse Anesthetist

## 2014-10-28 ENCOUNTER — Telehealth: Payer: Self-pay

## 2014-10-28 ENCOUNTER — Ambulatory Visit
Admission: RE | Admit: 2014-10-28 | Discharge: 2014-10-28 | Disposition: A | Discharge status: Home / Self Care | Payer: Medicare Other | Source: Ambulatory Visit | Attending: Urology | Admitting: Urology

## 2014-10-28 ENCOUNTER — Encounter
Admission: RE | Disposition: A | Discharge status: Home / Self Care | Payer: Self-pay | Source: Ambulatory Visit | Attending: Urology

## 2014-10-28 ENCOUNTER — Encounter: Payer: Self-pay | Admitting: *Deleted

## 2014-10-28 DIAGNOSIS — M5136 Other intervertebral disc degeneration, lumbar region: Secondary | ICD-10-CM | POA: Diagnosis not present

## 2014-10-28 DIAGNOSIS — C50912 Malignant neoplasm of unspecified site of left female breast: Secondary | ICD-10-CM

## 2014-10-28 DIAGNOSIS — Z7982 Long term (current) use of aspirin: Secondary | ICD-10-CM | POA: Diagnosis not present

## 2014-10-28 DIAGNOSIS — Z90710 Acquired absence of both cervix and uterus: Secondary | ICD-10-CM | POA: Diagnosis not present

## 2014-10-28 DIAGNOSIS — Z86010 Personal history of colonic polyps: Secondary | ICD-10-CM | POA: Insufficient documentation

## 2014-10-28 DIAGNOSIS — N1330 Unspecified hydronephrosis: Secondary | ICD-10-CM | POA: Diagnosis not present

## 2014-10-28 DIAGNOSIS — E039 Hypothyroidism, unspecified: Secondary | ICD-10-CM | POA: Diagnosis not present

## 2014-10-28 DIAGNOSIS — K219 Gastro-esophageal reflux disease without esophagitis: Secondary | ICD-10-CM | POA: Diagnosis not present

## 2014-10-28 DIAGNOSIS — D494 Neoplasm of unspecified behavior of bladder: Secondary | ICD-10-CM | POA: Diagnosis not present

## 2014-10-28 DIAGNOSIS — Z885 Allergy status to narcotic agent status: Secondary | ICD-10-CM | POA: Insufficient documentation

## 2014-10-28 DIAGNOSIS — N131 Hydronephrosis with ureteral stricture, not elsewhere classified: Secondary | ICD-10-CM | POA: Insufficient documentation

## 2014-10-28 DIAGNOSIS — C679 Malignant neoplasm of bladder, unspecified: Secondary | ICD-10-CM | POA: Diagnosis not present

## 2014-10-28 DIAGNOSIS — C50919 Malignant neoplasm of unspecified site of unspecified female breast: Secondary | ICD-10-CM | POA: Insufficient documentation

## 2014-10-28 DIAGNOSIS — Z8542 Personal history of malignant neoplasm of other parts of uterus: Secondary | ICD-10-CM | POA: Diagnosis not present

## 2014-10-28 DIAGNOSIS — I10 Essential (primary) hypertension: Secondary | ICD-10-CM | POA: Diagnosis not present

## 2014-10-28 DIAGNOSIS — Z8541 Personal history of malignant neoplasm of cervix uteri: Secondary | ICD-10-CM | POA: Insufficient documentation

## 2014-10-28 DIAGNOSIS — Z882 Allergy status to sulfonamides status: Secondary | ICD-10-CM | POA: Insufficient documentation

## 2014-10-28 DIAGNOSIS — N138 Other obstructive and reflux uropathy: Secondary | ICD-10-CM | POA: Diagnosis not present

## 2014-10-28 DIAGNOSIS — Z86718 Personal history of other venous thrombosis and embolism: Secondary | ICD-10-CM | POA: Diagnosis not present

## 2014-10-28 DIAGNOSIS — Z7902 Long term (current) use of antithrombotics/antiplatelets: Secondary | ICD-10-CM | POA: Diagnosis not present

## 2014-10-28 DIAGNOSIS — Z880 Allergy status to penicillin: Secondary | ICD-10-CM | POA: Diagnosis not present

## 2014-10-28 DIAGNOSIS — Z803 Family history of malignant neoplasm of breast: Secondary | ICD-10-CM | POA: Diagnosis not present

## 2014-10-28 DIAGNOSIS — R569 Unspecified convulsions: Secondary | ICD-10-CM | POA: Insufficient documentation

## 2014-10-28 DIAGNOSIS — M1990 Unspecified osteoarthritis, unspecified site: Secondary | ICD-10-CM | POA: Diagnosis not present

## 2014-10-28 DIAGNOSIS — I69320 Aphasia following cerebral infarction: Secondary | ICD-10-CM | POA: Insufficient documentation

## 2014-10-28 HISTORY — DX: Hiccough: R06.6

## 2014-10-28 HISTORY — DX: Chronic kidney disease, unspecified: N18.9

## 2014-10-28 HISTORY — PX: CYSTOSCOPY WITH STENT PLACEMENT: SHX5790

## 2014-10-28 HISTORY — PX: CYSTOSCOPY W/ RETROGRADES: SHX1426

## 2014-10-28 HISTORY — DX: Pain, unspecified: R52

## 2014-10-28 SURGERY — CYSTOSCOPY, WITH RETROGRADE PYELOGRAM
Anesthesia: General | Laterality: Right | Wound class: Clean Contaminated

## 2014-10-28 MED ORDER — LIDOCAINE HCL (CARDIAC) 20 MG/ML IV SOLN
INTRAVENOUS | Status: DC | PRN
  Administered 2014-10-28: 60 mg via INTRAVENOUS

## 2014-10-28 MED ORDER — PROPOFOL 10 MG/ML IV BOLUS
INTRAVENOUS | Status: DC | PRN
  Administered 2014-10-28: 100 mg via INTRAVENOUS

## 2014-10-28 MED ORDER — EPHEDRINE SULFATE 50 MG/ML IJ SOLN
INTRAMUSCULAR | Status: DC | PRN
  Administered 2014-10-28 (×2): 10 mg via INTRAVENOUS
  Administered 2014-10-28: 5 mg via INTRAVENOUS

## 2014-10-28 MED ORDER — IOTHALAMATE MEGLUMINE 17.2 % UR SOLN
URETHRAL | Status: DC | PRN
  Administered 2014-10-28: 15 mL via URETHRAL

## 2014-10-28 MED ORDER — CIPROFLOXACIN IN D5W 400 MG/200ML IV SOLN
400.0000 mg | INTRAVENOUS | Status: AC
  Administered 2014-10-28: 400 mg via INTRAVENOUS

## 2014-10-28 MED ORDER — FAMOTIDINE 20 MG PO TABS
20.0000 mg | ORAL_TABLET | Freq: Once | ORAL | Status: AC
  Administered 2014-10-28: 20 mg via ORAL

## 2014-10-28 MED ORDER — CIPROFLOXACIN IN D5W 400 MG/200ML IV SOLN
INTRAVENOUS | Status: AC
  Administered 2014-10-28: 400 mg via INTRAVENOUS
  Filled 2014-10-28: qty 200

## 2014-10-28 MED ORDER — FENTANYL CITRATE (PF) 100 MCG/2ML IJ SOLN
INTRAMUSCULAR | Status: DC | PRN
  Administered 2014-10-28 (×2): 25 ug via INTRAVENOUS

## 2014-10-28 MED ORDER — LACTATED RINGERS IV SOLN
INTRAVENOUS | Status: DC
  Administered 2014-10-28: 10:00:00 via INTRAVENOUS

## 2014-10-28 MED ORDER — ONDANSETRON HCL 4 MG/2ML IJ SOLN
INTRAMUSCULAR | Status: DC | PRN
  Administered 2014-10-28: 4 mg via INTRAVENOUS

## 2014-10-28 MED ORDER — SODIUM CHLORIDE 0.9 % IR SOLN
Status: DC | PRN
  Administered 2014-10-28: 300 mL

## 2014-10-28 MED ORDER — DEXAMETHASONE SODIUM PHOSPHATE 4 MG/ML IJ SOLN
INTRAMUSCULAR | Status: DC | PRN
Start: 2014-10-28 — End: 2014-10-28
  Administered 2014-10-28: 5 mg via INTRAVENOUS

## 2014-10-28 MED ORDER — FAMOTIDINE 20 MG PO TABS
ORAL_TABLET | ORAL | Status: AC
  Administered 2014-10-28: 20 mg via ORAL
  Filled 2014-10-28: qty 1

## 2014-10-28 SURGICAL SUPPLY — 20 items
BACTOSHIELD CHG 4% 4OZ (MISCELLANEOUS) ×1
CATH URETL 5X70 OPEN END (CATHETERS) ×2 IMPLANT
CONRAY 43 FOR UROLOGY 50M (MISCELLANEOUS) ×2 IMPLANT
DRESSING TELFA 4X3 1S ST N-ADH (GAUZE/BANDAGES/DRESSINGS) ×2 IMPLANT
GLOVE BIO SURGEON STRL SZ7 (GLOVE) ×4 IMPLANT
GLOVE BIO SURGEON STRL SZ7.5 (GLOVE) ×2 IMPLANT
GOWN L4 LG 24 PK N/S (GOWN DISPOSABLE) ×2 IMPLANT
GOWN STRL REUS W/TWL XL LVL3 (GOWN DISPOSABLE) ×2 IMPLANT
GUIDEWIRE SUPER STIFF (WIRE) ×2 IMPLANT
KIT RM TURNOVER CYSTO AR (KITS) ×2 IMPLANT
NDL SAFETY ECLIPSE 18X1.5 (NEEDLE) ×1 IMPLANT
NEEDLE HYPO 18GX1.5 SHARP (NEEDLE) ×1
PACK CYSTO AR (MISCELLANEOUS) ×2 IMPLANT
SCRUB CHG 4% DYNA-HEX 4OZ (MISCELLANEOUS) ×1 IMPLANT
SENSORWIRE 0.038 NOT ANGLED (WIRE) ×2
STENT URET 6FRX24 CONTOUR (STENTS) IMPLANT
STENT URET 6FRX26 CONTOUR (STENTS) IMPLANT
SURGILUBE 2OZ TUBE FLIPTOP (MISCELLANEOUS) ×2 IMPLANT
WATER STERILE IRR 3000ML UROMA (IV SOLUTION) ×2 IMPLANT
WIRE SENSOR 0.038 NOT ANGLED (WIRE) ×1 IMPLANT

## 2014-10-28 NOTE — Telephone Encounter (Signed)
-----   Message from [name], MD sent at 10/28/2014 12:12 PM EDT ----- I was unable to place a stent on Ms. Traci [name].  She will need IR to place a right percutaneous nephrostomy tube and attempt to place a right antegrade ureteral stent. She is scheduled for surgery for breast cancer on October 7. She needs this to be done prior to her breast surgery, or her breast surgeon won't operate on her. She is on Plavix. Can  can we arrange for her to get this procedure a day or 2 prior to her breast surgery, so she doesn't have to come off Plavix twice?  She'll also need a follow-up appointment for after her breast surgery. We will need to go over pathology results and order CT scan.  Thanks, Aaron Edelman

## 2014-10-28 NOTE — H&P (View-Only) (Signed)
10/23/2014 4:08 PM   Traci [name] Decent May 17, 1929 [id number]  Referring provider: Idelle Crouch, MD [place], [place] [address]  Chief Complaint  Patient presents with  . Hydronephrosis    possible blockage to the right kidney     HPI: The patient is a 81 year old female with past history significant for cervical cancer found to have severe right hydroureteronephrosis found incidentally on a spinal MRI.  The patient also reports pain that radiates from her back down the backside of her leg. Genitourinary symptoms that she is being treated for which he leads a UTI with Cipro at this time. She also has breast cancer, and her surgeon would like for her hydronephrosis corrected prior to this. Of note she is on aspirin and Plavix. She she underwent a total abdominal hysterectomy for cervical cancer 3 years ago. Of note she did receive radiation that time.   PMH: Past Medical History  Diagnosis Date  . History of esophageal stricture   . GERD (gastroesophageal reflux disease)   . Stroke 04/06/2011    residual:  aphasia  . Arthritis   . Endometrial adenocarcinoma 01/2012     stage Ib, grade 1, positive peritoneal cytology, no other risk factors or extra-uterine disease  . Inguinal lymphocyst   . History of brachytherapy   . History of shingles   . History of colon polyps   . Mild aphasia   . Hearing loss   . History of DVT (deep vein thrombosis)   . Cancer of left breast 10/20/2014    Surgical History: Past Surgical History  Procedure Laterality Date  . Esophageal dilation    . Thyroidectomy, partial    . Cataract extraction w/ intraocular lens implant      right  . Back surgery      fusion of L3&4  . Tonsillectomy    . Total abdominal hysterectomy w/ bilateral salpingoophorectomy Bilateral     staging biopsies  . Breast cyst aspiration N/A   . Breast biopsy Left 10/13/2014    path pending    Home Medications:    Medication List       This  list is accurate as of: 10/23/14  4:08 PM.  Always use your most recent med list.               calcium carbonate 1250 (500 CA) MG chewable tablet  Commonly known as:  OS-CAL  Chew 1 tablet by mouth daily.     ciprofloxacin 500 MG tablet  Commonly known as:  CIPRO  Take 1 tablet (500 mg total) by mouth 2 (two) times daily.     clopidogrel 75 MG tablet  Commonly known as:  PLAVIX  Take 1 tablet by mouth daily.     docusate 50 MG/5ML liquid  Commonly known as:  COLACE  Take 100 mg by mouth at bedtime as needed.     gabapentin 300 MG capsule  Commonly known as:  NEURONTIN  Take 1 capsule by mouth 2 (two) times daily.     multivitamin with minerals tablet  Take 1 tablet by mouth daily.     oxyCODONE-acetaminophen 5-325 MG per tablet  Commonly known as:  PERCOCET/ROXICET  Take 1 tablet by mouth every 6 (six) hours as needed.     simethicone 80 MG chewable tablet  Commonly known as:  MYLICON  Chew 80 mg by mouth daily.        Allergies:  Allergies  Allergen Reactions  . Penicillins Rash  .  Hydrocodone-Acetaminophen Nausea Only  . Novocain [Procaine] Other (See Comments) and Nausea And Vomiting    Chest pain  . Sulfa Antibiotics Other (See Comments)    CHEST PAIN    Family History: Family History  Problem Relation Age of Onset  . Stroke Mother   . Breast cancer Maternal Aunt     x 2    Social History:  reports that she has never smoked. She has never used smokeless tobacco. She reports that she does not drink alcohol or use illicit drugs.  ROS: UROLOGY Frequent Urination?: Yes Hard to postpone urination?: No Burning/pain with urination?: No Get up at night to urinate?: No Leakage of urine?: Yes Urine stream starts and stops?: No Trouble starting stream?: No Do you have to strain to urinate?: No Blood in urine?: No Urinary tract infection?: No Sexually transmitted disease?: No Injury to kidneys or bladder?: No Painful intercourse?: No Weak stream?:  No Currently pregnant?: No Vaginal bleeding?: No Last menstrual period?: No  Gastrointestinal Nausea?: No Vomiting?: No Indigestion/heartburn?: No Diarrhea?: No Constipation?: No  Constitutional Fever: No Night sweats?: No Weight loss?: No Fatigue?: No  Skin Skin rash/lesions?: No Itching?: No  Eyes Blurred vision?: No Double vision?: No  Ears/Nose/Throat Sore throat?: No Sinus problems?: No  Hematologic/Lymphatic Swollen glands?: No Easy bruising?: No  Cardiovascular Leg swelling?: No Chest pain?: No  Respiratory Cough?: No Shortness of breath?: No  Endocrine Excessive thirst?: No  Musculoskeletal Back pain?: Yes Joint pain?: No  Neurological Headaches?: No Dizziness?: No  Psychologic Depression?: Yes Anxiety?: No  Physical Exam: BP 120/66 mmHg  Pulse 70  Ht 5\' 1"  (1.549 m)  Wt 129 lb 9.6 oz (58.786 kg)  BMI 24.50 kg/m2  Constitutional:  Alert and oriented, No acute distress. HEENT: [place] AT, moist mucus membranes.  Trachea midline, no masses. Cardiovascular: No clubbing, cyanosis, or edema. Respiratory: Normal respiratory effort, no increased work of breathing. GI: Abdomen is soft, nontender, nondistended, no abdominal masses GU: No CVA tenderness.  Skin: No rashes, bruises or suspicious lesions. Lymph: No cervical or inguinal adenopathy. Neurologic: Grossly intact, no focal deficits, moving all 4 extremities. Psychiatric: Normal mood and affect.  Laboratory Data: Lab Results  Component Value Date   WBC 5.6 11/04/2013   HGB 14.0 11/04/2013   HCT 43.3 11/04/2013   MCV 93 11/04/2013   PLT 241 11/04/2013    Lab Results  Component Value Date   CREATININE 0.90 11/04/2013    No results found for: PSA  No results found for: TESTOSTERONE  Lab Results  Component Value Date   HGBA1C 5.5 04/08/2011    Urinalysis    Component Value Date/Time   COLORURINE YELLOW* 10/20/2014 0936   COLORURINE Yellow 03/29/2012 1930   APPEARANCEUR  CLEAR* 10/20/2014 0936   APPEARANCEUR Hazy 03/29/2012 1930   LABSPEC 1.013 10/20/2014 0936   LABSPEC 1.023 03/29/2012 1930   PHURINE 6.0 10/20/2014 0936   PHURINE 7.0 03/29/2012 1930   GLUCOSEU NEGATIVE 10/20/2014 0936   GLUCOSEU 50 mg/dL 03/29/2012 1930   HGBUR NEGATIVE 10/20/2014 0936   HGBUR Negative 03/29/2012 1930   BILIRUBINUR NEGATIVE 10/20/2014 0936   BILIRUBINUR Negative 03/29/2012 1930   KETONESUR NEGATIVE 10/20/2014 0936   KETONESUR 1+ 03/29/2012 1930   PROTEINUR NEGATIVE 10/20/2014 0936   PROTEINUR Negative 03/29/2012 1930   NITRITE NEGATIVE 10/20/2014 0936   NITRITE Negative 03/29/2012 1930   LEUKOCYTESUR NEGATIVE 10/20/2014 0936   LEUKOCYTESUR Negative 03/29/2012 1930    Pertinent Imaging:  Study Result  CLINICAL DATA: Lumbar spondylosis. Right leg pain and weakness. History of uterine cancer 2013  EXAM: MRI LUMBAR SPINE WITHOUT CONTRAST  TECHNIQUE: Multiplanar, multisequence MR imaging of the lumbar spine was performed. No intravenous contrast was administered.  COMPARISON: None.  FINDINGS: Severe right hydronephrosis with atrophy of the right renal cortex. There is dilatation of the right ureter suggesting distal obstruction. No obstruction of the left kidney.  Negative for fracture. Conus medullaris is normal and terminates at L1-2  T12-L1: Moderate disc degeneration without stenosis  L1-2: Extruded disc fragment on the left with upgoing disc material. No significant nerve root compression or spinal stenosis. Mild facet degeneration.  L2-3: Mild retrolisthesis. Moderate disc degeneration. Mild facet degeneration without significant spinal stenosis  L3-4: Pedicle screw and interbody fusion. Bilateral facet hypertrophy. Disc degeneration and spurring. Mild narrowing of the canal without significant spinal or foraminal stenosis.  L4-5: Mild disc bulging and mild facet hypertrophy without significant spinal stenosis.  L5-S1:  Moderate disc degeneration with disc space narrowing and spondylosis. No significant spinal or foraminal stenosis.  IMPRESSION: Severe right hydronephrosis and hydroureter. Further evaluation is recommended to determine the cause of obstruction. No prior imaging available. CT abdomen pelvis with contrast suggested. The patient has a history of uterine cancer and stricture or tumor could be present causing obstruction of the distal right ureter.  Extruded disc fragment L1-2 with cranial migration of disc material. No significant nerve root impingement  Disc and facet degeneration L2-3 without stenosis  Pedicle screw and interbody fusion L3-4 without significant spinal stenosis.  Disc and facet degeneration L4-5 and L5-S1 without significant stenosis.     Assessment & Plan:   The patient has chronic right hydronephrosis. This is likely been present for a long time. I suspect it's related to radiation that she had for her cervical cancer. Nonetheless, it does need to be addressed. Her breast surgeon would like this to be corrected as soon as possible so he can remove her breast cancer. We will attempt to have her added on next week for a cystoscopy stent. She is aware that if this is not possible an antegrade approach with interventional radiology may be necessary.   1. Chronic Right Hydronephrosis Follow up: cysto, stent next week  [name], [place] [address] [place], [place] [address] [phone]

## 2014-10-28 NOTE — Anesthesia Preprocedure Evaluation (Signed)
Anesthesia Evaluation  Patient identified by MRN, date of birth, ID band Patient awake    Reviewed: Allergy & Precautions, NPO status , Patient's Chart, lab work & pertinent test results  History of Anesthesia Complications Negative for: history of anesthetic complications  Airway Mallampati: III   Neck ROM: Full    Dental  (+) Partial Upper, Lower Dentures   Pulmonary neg pulmonary ROS,           Cardiovascular hypertension (pt denies),      Neuro/Psych Seizures - (pt denies),  CVA (speech, writing, goes to the right when walking), Residual Symptoms    GI/Hepatic GERD (pt denies)  ,  Endo/Other  Hypothyroidism (pt denies)   Renal/GU Renal InsufficiencyRenal disease     Musculoskeletal   Abdominal   Peds  Hematology   Anesthesia Other Findings   Reproductive/Obstetrics                             Anesthesia Physical Anesthesia Plan  ASA: III  Anesthesia Plan: General   Post-op Pain Management:    Induction: Intravenous  Airway Management Planned: LMA  Additional Equipment:   Intra-op Plan:   Post-operative Plan:   Informed Consent: I have reviewed the patients History and Physical, chart, labs and discussed the procedure including the risks, benefits and alternatives for the proposed anesthesia with the patient or authorized representative who has indicated his/her understanding and acceptance.     Plan Discussed with:   Anesthesia Plan Comments:         Anesthesia Quick Evaluation

## 2014-10-28 NOTE — Op Note (Signed)
Preoperative diagnosis: Right hydronephrosis  Postoperative diagnosis: Right hydronephrosis, right ureteral stricture, and bladder tumor  Procedure: 1. Cystoscopy 2. Right retrograde with radiological interpretation 3. Attempted right ureteral stent placement 4. Bladder biopsy 1 5. Fulguration of bleeders  Surgeon: Dr. Baruch Gouty  Radiological findings: Right hydroureteronephrosis to the level of the iliacs secondary to ureteral stricture  Findings: Unsuccessful placement of right ureteral stent  Disposition stable to the post anesthesia care unit  Indication for procedure: The patient is an 81 year old female with a history of cervical cancer status post hysterectomy and radiation who was incidentally found to have right hydroureteronephrosis. She has a planned breast surgery for cancer in the near future but her surgeon would like her hydronephrosis will leave prior to that.  Description procedure: The patient was met in the preoperative area all risks, benefits, indications of the procedure were described in detail. The patient said procedure. She was given preoperative antibiotics. She taken to the operative theater. Gen. anesthesia was induced per the anesthesia service. She was placed in the dorsal lithotomy position. She was prepped and draped in sterile fashion. Timeout was called. The 30  30 cystoscope with 21 French sheath was inserted in the patient's bladder per urethra atraumatically. peristalsis wasn't performed. A small lesion was seen on the commode on the dome of the bladder. Approximately a half centimeter. This was biopsied twice with cold biopsy forceps. There is a fulgurated until hemostasis is excellent. It does not appear to be cancer. It was more nodular than papillary. It may have been changes secondary to her previous radiation. Nonetheless, it was sent to pathology.  Attention was then taken take to the right ureter orifice and retrograde power was obtained on  the right side showing a stricture at the level of the right iliacs. There was a tortuous ureter past the point. A sensor wire was able to be passed past the stricture but was not able to be navigated past the tortuosity. Then and open-ended catheter was placedover the wire, but this was unsuccessful. The sensor wire was then exchanged for a Super Stiff guidewire. the guidewires was passed  through the stricture but was unable to get past the tortuosity into the renal pelvis. Attempts to place an open-ended catheter over this was unsuccessful at this point. It was deemed to be unsafe to try ureteral dilation at this point due to there is no guidewire axis to the patient's right renal pelvis. After oxalate one half hour of trying to manipulate the wire and open-ended ureteral catheter to have safely axis the right renal pelvis it was determined that this was not possible. We did not dilate the ureters again as we did not have access to the right renal pelvis with a safety wire. At this point the operation was aborted and the patient bladder was emptied. The patient was woke from anesthesia in stable condition transferred to the post care unit.  Plan: 1.  We will have the patient see interventional radiology for them to place an antegrade right ureteral stent. If they are unsuccessful, we've requested that they place a right nephrostomy tube. This will hopefully be core according to happened just before her planned breast cancer surgery so she does not have to come off Plavix twice. 2. The patient will need to follow-up for pathology results after her nephrostomy tube placement 3. The patient will need a CT urogram after her nephrostomy tube placement and breast surgery to better visualize the etiology of the stricture.

## 2014-10-28 NOTE — Interval H&P Note (Signed)
History and Physical Interval Note:  10/28/2014 11:07 AM  Traci [name]  has presented today for surgery, with the diagnosis of RIGHT HYDRONEPHROSIS  The various methods of treatment have been discussed with the patient and family. After consideration of risks, benefits and other options for treatment, the patient has consented to  Procedure(s): CYSTOSCOPY WITH RETROGRADE PYELOGRAM (Right) CYSTOSCOPY WITH STENT PLACEMENT (Right) as a surgical intervention .  The patient's history has been reviewed, patient examined, no change in status, stable for surgery.  I have reviewed the patient's chart and labs.  Questions were answered to the patient's satisfaction.     [name]

## 2014-10-28 NOTE — Discharge Instructions (Signed)
AMBULATORY SURGERY  DISCHARGE INSTRUCTIONS   1) The drugs that you were given will stay in your system until tomorrow so for the next 24 hours you should not:  A) Drive an automobile B) Make any legal decisions C) Drink any alcoholic beverage   2) You may resume regular meals tomorrow.  Today it is better to start with liquids and gradually work up to solid foods.  You may eat anything you prefer, but it is better to start with liquids, then soup and crackers, and gradually work up to solid foods.   3) Please notify your doctor immediately if you have any unusual bleeding, trouble breathing, redness and pain at the surgery site, drainage, fever, or pain not relieved by medication.    4) Additional Instructions:        Please contact your physician with any problems or Same Day Surgery at [phone], Monday through Friday 6 am to 4 pm, or [place] [facility] number at [phone].General Anesthesia General anesthesia is a sleep-like state of non-feeling produced by medicines (anesthetics). General anesthesia prevents you from being alert and feeling pain during a medical procedure. Your caregiver may recommend general anesthesia if your procedure:  Is long.  Is painful or uncomfortable.  Would be frightening to see or hear.  Requires you to be still.  Affects your breathing.  Causes significant blood loss. LET YOUR CAREGIVER KNOW ABOUT:  Allergies to food or medicine.  Medicines taken, including vitamins, herbs, eyedrops, over-the-counter medicines, and creams.  Use of steroids (by mouth or creams).  Previous problems with anesthetics or numbing medicines, including problems experienced by relatives.  History of bleeding problems or blood clots.  Previous surgeries and types of anesthetics received.  Possibility of pregnancy, if this applies.  Use of cigarettes, alcohol, or illegal drugs.  Any health condition(s), especially diabetes, sleep  apnea, and high blood pressure. RISKS AND COMPLICATIONS General anesthesia rarely causes complications. However, if complications do occur, they can be life threatening. Complications include:  A lung infection.  A stroke.  A heart attack.  Waking up during the procedure. When this occurs, the patient may be unable to move and communicate that he or she is awake. The patient may feel severe pain. Older adults and adults with serious medical problems are more likely to have complications than adults who are young and healthy. Some complications can be prevented by answering all of your caregiver's questions thoroughly and by following all pre-procedure instructions. It is important to tell your caregiver if any of the pre-procedure instructions, especially those related to diet, were not followed. Any food or liquid in the stomach can cause problems when you are under general anesthesia. BEFORE THE PROCEDURE  Ask your caregiver if you will have to spend the night at the hospital. If you will not have to spend the night, arrange to have an adult drive you and stay with you for 24 hours.  Follow your caregiver's instructions if you are taking dietary supplements or medicines. Your caregiver may tell you to stop taking them or to reduce your dosage.  Do not smoke for as long as possible before your procedure. If possible, stop smoking 3-6 weeks before the procedure.  Do not take new dietary supplements or medicines within 1 week of your procedure unless your caregiver approves them.  Do not eat within 8 hours of your procedure or as directed by your caregiver. Drink only clear liquids, such as water, black coffee (without milk or cream),  and fruit juices (without pulp).  Do not drink within 3 hours of your procedure or as directed by your caregiver.  You may brush your teeth on the morning of the procedure, but make sure to spit out the toothpaste and water when finished. PROCEDURE  You will  receive anesthetics through a mask, through an intravenous (IV) access tube, or through both. A doctor who specializes in anesthesia (anesthesiologist) or a nurse who specializes in anesthesia (nurse anesthetist) or both will stay with you throughout the procedure to make sure you remain unconscious. He or she will also watch your blood pressure, pulse, and oxygen levels to make sure that the anesthetics do not cause any problems. Once you are asleep, a breathing tube or mask may be used to help you breathe. AFTER THE PROCEDURE You will wake up after the procedure is complete. You may be in the room where the procedure was performed or in a recovery area. You may have a sore throat if a breathing tube was used. You may also feel:  Dizzy.  Weak.  Drowsy.  Confused.  Nauseous.  Cold. These are all normal responses and can be expected to last for up to 24 hours after the procedure is complete. A caregiver will tell you when you are ready to go home. This will usually be when you are fully awake and in stable condition. Document Released: 05/02/2007 Document Revised: 06/09/2013 Document Reviewed: 05/24/2011 [facility] Patient Information 2015 [place], [place]. This information is not intended to replace advice given to you by your health care provider. Make sure you discuss any questions you have with your health care provider.

## 2014-10-28 NOTE — Anesthesia Postprocedure Evaluation (Signed)
  Anesthesia Post-op Note  Patient: Traci [name]  Procedure(s) Performed: Procedure(s): CYSTOSCOPY WITH RETROGRADE PYELOGRAM ATTEMPT, UNABLE TO PASS  (Right) BLADDER BIOPSY WITH FULGERATION  (Right)  Anesthesia type:General  Patient location: PACU  Post pain: Pain level controlled  Post assessment: Post-op Vital signs reviewed, Patient's Cardiovascular Status Stable, Respiratory Function Stable, Patent Airway and No signs of Nausea or vomiting  Post vital signs: Reviewed and stable  Last Vitals:  Filed Vitals:   10/28/14 1326  BP: 133/57  Pulse: 84  Temp: 35.8 C  Resp: 16    Level of consciousness: awake, alert  and patient cooperative  Complications: No apparent anesthesia complications

## 2014-10-28 NOTE — Transfer of Care (Signed)
Immediate Anesthesia Transfer of Care Note  Patient: Traci [name]  Procedure(s) Performed: Procedure(s): CYSTOSCOPY WITH RETROGRADE PYELOGRAM ATTEMPT, UNABLE TO PASS  (Right) BLADDER BIOPSY WITH FULGERATION  (Right)  Patient Location: PACU  Anesthesia Type:General  Level of Consciousness: sedated  Airway & Oxygen Therapy: Patient Spontanous Breathing and Patient connected to face mask oxygen  Post-op Assessment: Report given to RN and Post -op Vital signs reviewed and stable  Post vital signs: Reviewed and stable  Last Vitals:  Filed Vitals:   10/28/14 1203  BP: 117/56  Pulse: 65  Temp: 36.3 C  Resp: 11    Complications: No apparent anesthesia complications

## 2014-10-28 NOTE — Brief Op Note (Signed)
10/28/2014  12:11 PM  PATIENT:  Traci [name]  81 y.o. female  PRE-OPERATIVE DIAGNOSIS:  RIGHT HYDRONEPHROSIS  POST-OPERATIVE DIAGNOSIS:  RIGHT HYDRONEPHROSIS, Right UReteral stricture, bladder tumor  PROCEDURE:  Procedure(s): CYSTOSCOPY WITH RETROGRADE PYELOGRAM ATTEMPT, UNABLE TO PASS  (Right) BLADDER BIOPSY WITH FULGERATION  (Right)  SURGEON:  Surgeon(s) and Role:    * [name], MD - Primary  PHYSICIAN ASSISTANT:   ASSISTANTS: none   ANESTHESIA:   general  EBL:  Total I/O In: 500 [I.V.:500] Out: 0   BLOOD ADMINISTERED:none  DRAINS: none   LOCAL MEDICATIONS USED:  NONE  SPECIMEN:  Source of Specimen:  bladder  DISPOSITION OF SPECIMEN:  PATHOLOGY  COUNTS:  YES  TOURNIQUET:  * No tourniquets in log *  DICTATION: .Dragon Dictation  PLAN OF CARE: Discharge to home after PACU  PATIENT DISPOSITION:  PACU - hemodynamically stable.   Delay start of Pharmacological VTE agent (>24hrs) due to surgical blood loss or risk of bleeding: yes

## 2014-10-29 ENCOUNTER — Other Ambulatory Visit: Payer: Self-pay

## 2014-10-29 DIAGNOSIS — N200 Calculus of kidney: Secondary | ICD-10-CM

## 2014-10-29 NOTE — Telephone Encounter (Signed)
Dr. Luana Shu from pathology called stating pt bladder bx came back as endometrial cancer. Pt has a hx of breast cancer and endometrial cancer. I have set up an appt for 11/04/14 at 10:30 for pt to see you. Dr. Louis Meckel said you should see this pt next week.

## 2014-11-02 ENCOUNTER — Telehealth: Payer: Self-pay | Admitting: *Deleted

## 2014-11-02 NOTE — Telephone Encounter (Signed)
Patient called today.  Wants to know if I could cancel her pre-op visit.  States things have changed.  I have to see the Kidney doctor on Wednesday.  States she was in so much pain in her leg that she almost went to the ED.  Discussed case with Dr. Oliva Bustard.  He has agreed to see her tomorrow.  Patient notified of her appointment at 11:45.  Called Dr. Thompson Caul office and talked with his nurse Judeen Hammans, that the patient wanted to cancel her pre-op appointment.  Informed her of new pathology and for Dr. Tamala Julian to discuss case with Dr. Oliva Bustard.

## 2014-11-03 ENCOUNTER — Inpatient Hospital Stay (HOSPITAL_BASED_OUTPATIENT_CLINIC_OR_DEPARTMENT_OTHER): Payer: Medicare Other | Admitting: Oncology

## 2014-11-03 VITALS — BP 134/82 | HR 65 | Temp 96.4°F | Wt 127.5 lb

## 2014-11-03 DIAGNOSIS — Z8542 Personal history of malignant neoplasm of other parts of uterus: Secondary | ICD-10-CM

## 2014-11-03 DIAGNOSIS — C7911 Secondary malignant neoplasm of bladder: Secondary | ICD-10-CM

## 2014-11-03 DIAGNOSIS — N134 Hydroureter: Secondary | ICD-10-CM

## 2014-11-03 DIAGNOSIS — Z79899 Other long term (current) drug therapy: Secondary | ICD-10-CM

## 2014-11-03 DIAGNOSIS — C50812 Malignant neoplasm of overlapping sites of left female breast: Secondary | ICD-10-CM | POA: Diagnosis not present

## 2014-11-03 DIAGNOSIS — C50912 Malignant neoplasm of unspecified site of left female breast: Secondary | ICD-10-CM

## 2014-11-03 DIAGNOSIS — Z90710 Acquired absence of both cervix and uterus: Secondary | ICD-10-CM

## 2014-11-03 DIAGNOSIS — Z170 Estrogen receptor positive status [ER+]: Secondary | ICD-10-CM

## 2014-11-03 DIAGNOSIS — K219 Gastro-esophageal reflux disease without esophagitis: Secondary | ICD-10-CM

## 2014-11-03 DIAGNOSIS — Z90722 Acquired absence of ovaries, bilateral: Secondary | ICD-10-CM

## 2014-11-03 DIAGNOSIS — M1990 Unspecified osteoarthritis, unspecified site: Secondary | ICD-10-CM

## 2014-11-03 DIAGNOSIS — R300 Dysuria: Secondary | ICD-10-CM

## 2014-11-03 DIAGNOSIS — N1330 Unspecified hydronephrosis: Secondary | ICD-10-CM

## 2014-11-03 MED ORDER — OXYCODONE HCL ER 10 MG PO T12A: 10.0000 mg | EXTENDED_RELEASE_TABLET | Freq: Two times a day (BID) | ORAL | Status: DC

## 2014-11-03 NOTE — Progress Notes (Signed)
Patient does have living will. Never smoked. Yellow bracelet placed on patient.  Educated patient to wear bracelet to all appointments even if it is lab only.

## 2014-11-03 NOTE — Progress Notes (Signed)
  Oncology Nurse Navigator Documentation      Patient Visit Type:  (acute add on) (11/03/14 1500)                    Time Spent with Patient: 30 (11/03/14 1500)   Pt came in today as an acute add on for right leg/gluteal pain not being controlled with prescribed medications. Was able to meet pt and her spouse. Offered support as she was told pathology report from bladder biopsy was recurrent endometrial cancer. Breast surgery has been postponed at this time. Oxycontin added for further pain control. Ct  Chest/abd/pelvis for restaging disease. Will follow her care. [facility] planning Percutanteous stent placement for hydronephrosis. Will accompany her to this appt tomm.

## 2014-11-04 ENCOUNTER — Telehealth: Payer: Self-pay | Admitting: Radiology

## 2014-11-04 ENCOUNTER — Ambulatory Visit (INDEPENDENT_AMBULATORY_CARE_PROVIDER_SITE_OTHER): Payer: Medicare Other | Admitting: Urology

## 2014-11-04 VITALS — BP 129/65 | HR 65 | Ht 59.0 in | Wt 124.8 lb

## 2014-11-04 DIAGNOSIS — C541 Malignant neoplasm of endometrium: Secondary | ICD-10-CM | POA: Diagnosis not present

## 2014-11-04 DIAGNOSIS — N131 Hydronephrosis with ureteral stricture, not elsewhere classified: Secondary | ICD-10-CM

## 2014-11-04 NOTE — Progress Notes (Signed)
11/04/2014 10:56 AM   Traci [name] [birth year] [id number]  Referring provider: Idelle Crouch, MD [address], [place] [address]  Chief Complaint  Patient presents with  . Results    HPI: The patient is a 81 year old female with past history significant for cervical cancer found to have severe right hydroureteronephrosis found incidentally on a spinal MRI. The patient also reports pain that radiates from her back down the backside of her leg. Genitourinary symptoms that she is being treated for which he leads a UTI with Cipro at this time. She also has breast cancer, and her surgeon would like for her hydronephrosis corrected prior to this. Of note she is on aspirin and Plavix. She she underwent a total abdominal hysterectomy for cervical cancer 3 years ago. Of note she did receive radiation that time.  Interval History: The patient underwent an attempted right ureteral stent placement that was unsuccessful. At that time a small mass was seen in the bladder which was biopsied. This came back as endometrial carcinoma.   PMH: Past Medical History  Diagnosis Date  . History of esophageal stricture   . GERD (gastroesophageal reflux disease)   . Arthritis   . Endometrial adenocarcinoma 01/2012     stage Ib, grade 1, positive peritoneal cytology, no other risk factors or extra-uterine disease  . Inguinal lymphocyst   . History of brachytherapy   . History of shingles   . History of colon polyps   . Mild aphasia   . Hearing loss   . History of DVT (deep vein thrombosis)   . Cancer of left breast 10/20/2014  . Stroke 04/06/2011    residual:  aphasia  . Hiccups   . Pain     SEVERE CHRONIC RIGHT LEG,HIP,FOOT  . Chronic kidney disease     HYDRONEPHROSIS    Surgical History: Past Surgical History  Procedure Laterality Date  . Esophageal dilation    . Thyroidectomy, partial    . Cataract extraction w/ intraocular lens implant      right  . Back surgery       fusion of L3&4  . Tonsillectomy    . Total abdominal hysterectomy w/ bilateral salpingoophorectomy Bilateral     staging biopsies  . Breast cyst aspiration N/A   . Breast biopsy Left 10/13/2014    path pending  . Cystoscopy w/ retrogrades Right 10/28/2014    Procedure: CYSTOSCOPY WITH RETROGRADE PYELOGRAM ATTEMPT, UNABLE TO PASS ;  Surgeon: [name], MD;  Location: [facility] ORS;  Service: Urology;  Laterality: Right;  . Cystoscopy with stent placement Right 10/28/2014    Procedure: BLADDER BIOPSY WITH FULGERATION ;  Surgeon: [name], MD;  Location: [facility] ORS;  Service: Urology;  Laterality: Right;    Home Medications:    Medication List       This list is accurate as of: 11/04/14 10:56 AM.  Always use your most recent med list.               calcium carbonate 1250 (500 CA) MG chewable tablet  Commonly known as:  OS-CAL  Chew 1 tablet by mouth daily.     clopidogrel 75 MG tablet  Commonly known as:  PLAVIX  Take 1 tablet by mouth daily.     gabapentin 300 MG capsule  Commonly known as:  NEURONTIN  Take 1 capsule by mouth 2 (two) times daily.     multivitamin with minerals tablet  Take 1 tablet by mouth daily.  OxyCODONE 10 mg T12a 12 hr tablet  Commonly known as:  OXYCONTIN  Take 1 tablet (10 mg total) by mouth every 12 (twelve) hours.     oxyCODONE-acetaminophen 5-325 MG tablet  Commonly known as:  PERCOCET/ROXICET  Take 1 tablet by mouth every 6 (six) hours as needed.     simethicone 80 MG chewable tablet  Commonly known as:  MYLICON  Chew 80 mg by mouth daily.        Allergies:  Allergies  Allergen Reactions  . Penicillins Rash  . Hydrocodone-Acetaminophen Nausea Only  . Novocain [Procaine] Other (See Comments) and Nausea And Vomiting    Chest pain  . Sulfa Antibiotics Other (See Comments)    CHEST PAIN    Family History: Family History  Problem Relation Age of Onset  . Stroke Mother   . Breast cancer Maternal Aunt     x 2     Social History:  reports that she has never smoked. She has never used smokeless tobacco. She reports that she does not drink alcohol or use illicit drugs.  ROS: UROLOGY Frequent Urination?: No Hard to postpone urination?: No Burning/pain with urination?: No Get up at night to urinate?: No Leakage of urine?: No Urine stream starts and stops?: No Trouble starting stream?: No Do you have to strain to urinate?: No Blood in urine?: No Urinary tract infection?: No Sexually transmitted disease?: No Injury to kidneys or bladder?: No Painful intercourse?: No Weak stream?: No Currently pregnant?: No Vaginal bleeding?: No Last menstrual period?: n  Gastrointestinal Nausea?: No Vomiting?: No Indigestion/heartburn?: No Diarrhea?: No Constipation?: Yes  Constitutional Fever: No Night sweats?: No Weight loss?: No Fatigue?: Yes  Skin Skin rash/lesions?: No Itching?: No  Eyes Blurred vision?: No Double vision?: No  Ears/Nose/Throat Sore throat?: No Sinus problems?: No  Hematologic/Lymphatic Swollen glands?: No Easy bruising?: No  Cardiovascular Leg swelling?: No Chest pain?: No  Respiratory Cough?: No Shortness of breath?: No  Endocrine Excessive thirst?: No  Musculoskeletal Back pain?: No Joint pain?: Yes  Neurological Headaches?: No Dizziness?: No  Psychologic Depression?: No Anxiety?: No  Physical Exam: BP 129/65 mmHg  Pulse 65  Ht 4\' 11"  (1.499 m)  Wt 124 lb 12.8 oz (56.609 kg)  BMI 25.19 kg/m2  Constitutional:  Alert and oriented, No acute distress. HEENT: [place] AT, moist mucus membranes.  Trachea midline, no masses. Cardiovascular: No clubbing, cyanosis, or edema. Respiratory: Normal respiratory effort, no increased work of breathing. GI: Abdomen is soft, nontender, nondistended, no abdominal masses GU: No CVA tenderness. Skin: No rashes, bruises or suspicious lesions. Lymph: No cervical or inguinal adenopathy. Neurologic: Grossly  intact, no focal deficits, moving all 4 extremities. Psychiatric: Normal mood and affect.  Laboratory Data: Lab Results  Component Value Date   WBC 5.6 11/04/2013   HGB 14.0 11/04/2013   HCT 43.3 11/04/2013   MCV 93 11/04/2013   PLT 241 11/04/2013    Lab Results  Component Value Date   CREATININE 0.90 11/04/2013    No results found for: PSA  No results found for: TESTOSTERONE  Lab Results  Component Value Date   HGBA1C 5.5 04/08/2011    Urinalysis    Component Value Date/Time   COLORURINE YELLOW* 10/20/2014 0936   COLORURINE Yellow 03/29/2012 1930   APPEARANCEUR CLEAR* 10/20/2014 0936   APPEARANCEUR Hazy 03/29/2012 1930   LABSPEC 1.013 10/20/2014 0936   LABSPEC 1.023 03/29/2012 1930   PHURINE 6.0 10/20/2014 0936   PHURINE 7.0 03/29/2012 1930   GLUCOSEU NEGATIVE 10/20/2014  [place] mg/dL 03/29/2012 1930   HGBUR NEGATIVE 10/20/2014 0936   HGBUR Negative 03/29/2012 1930   BILIRUBINUR NEGATIVE 10/20/2014 0936   BILIRUBINUR Negative 03/29/2012 1930   KETONESUR NEGATIVE 10/20/2014 0936   KETONESUR 1+ 03/29/2012 1930   PROTEINUR NEGATIVE 10/20/2014 0936   PROTEINUR Negative 03/29/2012 1930   NITRITE NEGATIVE 10/20/2014 0936   NITRITE Negative 03/29/2012 1930   LEUKOCYTESUR NEGATIVE 10/20/2014 0936   LEUKOCYTESUR Negative 03/29/2012 1930      Assessment & Plan:    1. Right hydroureteronephrosis A long discussion with the patient and her family about athe ntegrade approach to drain her kidney. She is scheduled for a right percutaneous nephrostomy tube next week with the hope of also placing a right antegrade stent. The family is aware that this may not be possible due to the tortuosity of her ureter. Depending on her upcoming CT findings, there may be an opportunity to later place a ureteral stent either antegrade or retrograde once her ureter is decompressed. Family is also aware that this still though may not be possible depending on her disease burden  seen on CT scan. There is a possibility that her stricture is of malignant origin.  2. Endometrial carcinoma invasion of the bladder There was an incidental finding of endometrial carcinoma in her urinary bladder at her last cystoscopy. She has seen Dr.[name] and imaging is pending. Oncological treatment per Dr. Oliva Bustard.    [name], MD  [facility] [address] [place], [place] [address] [phone]

## 2014-11-04 NOTE — Telephone Encounter (Signed)
Pt notified of f/u appt 12/03/14 @10 :30 with Dr Pilar Jarvis. Also reiterated that surgery was scheduled 11/13/14 & to arrive at 8:00 to [employer]. Hold Plavix 5 days prior to surgery with last dose being on 11/07/14. Pt verbalizes understanding.

## 2014-11-05 ENCOUNTER — Other Ambulatory Visit: Payer: Medicare Other

## 2014-11-05 ENCOUNTER — Ambulatory Visit: Payer: Self-pay

## 2014-11-06 ENCOUNTER — Encounter: Payer: Self-pay | Admitting: Oncology

## 2014-11-06 NOTE — Progress Notes (Signed)
[place] @ [place] [facility] Telephone:([phone]  Fax:([phone]   INITIAL CONSULT  Traci [name] OB: [birth year]  MR#: [id number]  BLT#:[id number]  Patient Care Team: Idelle Crouch, MD as PCP - General (Unknown Physician Specialty) [name], MD as Referring Physician (Surgery)  CHIEF COMPLAINT:  Chief Complaint  Patient presents with  . OTHER  1/  VISIT DIAGNOSIS:     ICD-9-CM ICD-10-CM   1. Cancer of left breast 174.9 C50.912 OxyCODONE (OXYCONTIN) 10 mg T12A 12 hr tablet     CT Abdomen Pelvis W Contrast     CT Chest W Contrast  2. History of endometrial cancer V10.42 Z85.42 OxyCODONE (OXYCONTIN) 10 mg T12A 12 hr tablet     CT Abdomen Pelvis W Contrast     CT Chest W Contrast    Oncology History   1.  Carcinoma of left breast  IMPRESSION: Ultrasound-guided biopsy of a suspicious left breast mass at 9 o'clock. No apparent complications.   2.  Estrogen receptor positive.  Progesterone receptor positive.  HER-2 receptor 2+ by  I[facility] . Fish is pending  5 mm tumor clinically stage IB N0 M0 tumor   3.  Patient has a previous history of endometrium  carcinoma of endometrium stage IB disease status post bilateral salpingo-oophorectomy and radiation therapy 4.  Right hydronephrosis detected on MRI scan off lumbosacral spine.  Cystoscopy with attempted stent placement revealed bladder tumor in September of 2016 biopsies positive for recurrent endometrial cancer  Oncology Flowsheet 04/08/2011 10/28/2014  dexamethasone (DECADRON) IJ - -  enoxaparin (LOVENOX) [name] 40 mg -  ondansetron (ZOFRAN) IV - -    INTERVAL HISTORY:  81 year old lady was getting regular mammogram done.  Patient had abnormal mammogram initial biopsies showed 5 mm tumor at 9:00 position in the left breast.  Biopsy of which is positive for invasive carcinoma patient was referred to me for preop treatment planning.  Patient had a previous history of carcinoma of endometrium.  Patient is not on any  anti-hormonal therapy.  And does not have any significant family history of breast cancer.  September 2 016 Patient has significant right-sided pain.  Patient underwent MRI scan of lumbosacral spine right hydronephrosis was found.  Patient was evaluated by a urologist and cystoscopy revealed bladder tumor which was blocking right ureter.  Attempted stent placement was unsuccessful.  Percutaneous stent placement has been planned.  Patient had biopsy which was consistent with recurrent carcinoma of uterus.  Patient is here for further evaluation and treatment consideration. Patient also had a lumpectomy scheduled for carcinoma of breast stage IB disease Pain is still continues to bother patient  REVIEW OF SYSTEMS:   GENERAL:  Feels good.  Active.  No fevers, sweats or weight loss. PERFORMANCE STATUS (ECOG):  01 HEENT:  No visual changes, runny nose, sore throat, mouth sores or tenderness. Lungs: No shortness of breath or cough.  No hemoptysis. Cardiac:  No chest pain, palpitations, orthopnea, or PND. Right flank pain.  Right low back pain.  Right pain radiating down her right lower extremity.  Recent cystoscopy is consistent with bladder tumor.  Biopsies positive for endometrial cancer  Musculoskeletal:  No back pain.  No joint pain.  No muscle tenderness. Extremities:  No pain or swelling. Skin:  No rashes or skin changes. Neuro:  No headache, numbness or weakness, balance or coordination issues. Endocrine:  No diabetes, thyroid issues, hot flashes or night sweats. Psych:  No mood changes, depression or anxiety. Pain:  No focal pain.  Review of systems:  All other systems reviewed and found to be negative.  As per HPI. Otherwise, a complete review of systems is negatve.  PAST MEDICAL HISTORY: Past Medical History  Diagnosis Date  . History of esophageal stricture   . GERD (gastroesophageal reflux disease)   . Arthritis   . Endometrial adenocarcinoma 01/2012     stage Ib, grade 1,  positive peritoneal cytology, no other risk factors or extra-uterine disease  . Inguinal lymphocyst   . History of brachytherapy   . History of shingles   . History of colon polyps   . Mild aphasia   . Hearing loss   . History of DVT (deep vein thrombosis)   . Cancer of left breast 10/20/2014  . Stroke 04/06/2011    residual:  aphasia  . Hiccups   . Pain     SEVERE CHRONIC RIGHT LEG,HIP,FOOT  . Chronic kidney disease     HYDRONEPHROSIS    PAST SURGICAL HISTORY: Past Surgical History  Procedure Laterality Date  . Esophageal dilation    . Thyroidectomy, partial    . Cataract extraction w/ intraocular lens implant      right  . Back surgery      fusion of L3&4  . Tonsillectomy    . Total abdominal hysterectomy w/ bilateral salpingoophorectomy Bilateral     staging biopsies  . Breast cyst aspiration N/A   . Breast biopsy Left 10/13/2014    path pending  . Cystoscopy w/ retrogrades Right 10/28/2014    Procedure: CYSTOSCOPY WITH RETROGRADE PYELOGRAM ATTEMPT, UNABLE TO PASS ;  Surgeon: [name], MD;  Location: [facility] ORS;  Service: Urology;  Laterality: Right;  . Cystoscopy with stent placement Right 10/28/2014    Procedure: BLADDER BIOPSY WITH FULGERATION ;  Surgeon: [name], MD;  Location: [facility] ORS;  Service: Urology;  Laterality: Right;    FAMILY HISTORY Family History  Problem Relation Age of Onset  . Stroke Mother   . Breast cancer Maternal Aunt     x 2        ADVANCED DIRECTIVES:  Patient does not have any living will or healthcare power of attorney.  Information was given .  Available resources had been discussed.  We will follow-up on subsequent appointments regarding this issue  HEALTH MAINTENANCE: Social History  Substance Use Topics  . Smoking status: Never Smoker   . Smokeless tobacco: Never Used  . Alcohol Use: No      Allergies  Allergen Reactions  . Penicillins Rash  . Hydrocodone-Acetaminophen Nausea Only  . Novocain [Procaine]  Other (See Comments) and Nausea And Vomiting    Chest pain  . Sulfa Antibiotics Other (See Comments)    CHEST PAIN    Current Outpatient Prescriptions  Medication Sig Dispense Refill  . calcium carbonate (OS-CAL) 1250 MG chewable tablet Chew 1 tablet by mouth daily.    . clopidogrel (PLAVIX) 75 MG tablet Take 1 tablet by mouth daily.    Marland Kitchen gabapentin (NEURONTIN) 300 MG capsule Take 1 capsule by mouth 2 (two) times daily.    . Multiple Vitamins-Minerals (MULTIVITAMIN WITH MINERALS) tablet Take 1 tablet by mouth daily.    Marland Kitchen oxyCODONE-acetaminophen (PERCOCET/ROXICET) 5-325 MG per tablet Take 1 tablet by mouth every 6 (six) hours as needed.    . simethicone (MYLICON) 80 MG chewable tablet Chew 80 mg by mouth daily.    . OxyCODONE (OXYCONTIN) 10 mg T12A 12 hr tablet Take 1 tablet (10 mg total) by mouth every  12 (twelve) hours. 60 tablet 0   No current facility-administered medications for this visit.    OBJECTIVE: PHYSICAL EXAM: GENERAL:  Well developed, well nourished, sitting comfortably in the exam room in no acute distress. MENTAL STATUS:  Alert and oriented to person, place and time.  ENT:  Oropharynx clear without lesion.  Tongue normal. Mucous membranes moist.  RESPIRATORY:  Clear to auscultation without rales, wheezes or rhonchi. CARDIOVASCULAR:  Regular rate and rhythm without murmur, rub or gallop. BREAST:  Right breast without masses, skin changes or nipple discharge.  .  Left breast inner and upper quadrant patient had ecchymosis from previous biopsy.  No palpable masses so no palpable lymphadenopathy ABDOMEN:  Soft, non-tender, with active bowel sounds, and no hepatosplenomegaly.  No masses. BACK:   CVA tenderness.  No tenderness on percussion of the back or rib cage. SKIN:  No rashes, ulcers or lesions. EXTREMITIES: No edema, no skin discoloration or tenderness.  No palpable cords. LYMPH NODES: No palpable cervical, supraclavicular, axillary or inguinal adenopathy    NEUROLOGICAL: Unremarkable. PSYCH:  Appropriate.  Filed Vitals:   11/03/14 1230  BP: 134/82  Pulse: 65  Temp: 96.4 F (35.8 C)     Body mass index is 24.1 kg/(m^2).    ECOG FS:1 - Symptomatic but completely ambulatory  LAB RESULTS:  No recent lab at present time other than serum creatinine Biopsy report has been reviewed All lab data has been reviewed  STUDIES: Mr Lumbar Spine Wo Contrast  10/11/2014   CLINICAL DATA:  Lumbar spondylosis. Right leg pain and weakness. History of uterine cancer 2013  EXAM: MRI LUMBAR SPINE WITHOUT CONTRAST  TECHNIQUE: Multiplanar, multisequence MR imaging of the lumbar spine was performed. No intravenous contrast was administered.  COMPARISON:  None.  FINDINGS: Severe right hydronephrosis with atrophy of the right renal cortex. There is dilatation of the right ureter suggesting distal obstruction. No obstruction of the left kidney.  Negative for fracture. Conus medullaris is normal and terminates at L1-2  T12-L1:  Moderate disc degeneration without stenosis  L1-2: Extruded disc fragment on the left with upgoing disc material. No significant nerve root compression or spinal stenosis. Mild facet degeneration.  L2-3: Mild retrolisthesis. Moderate disc degeneration. Mild facet degeneration without significant spinal stenosis  L3-4: Pedicle screw and interbody fusion. Bilateral facet hypertrophy. Disc degeneration and spurring. Mild narrowing of the canal without significant spinal or foraminal stenosis.  L4-5: Mild disc bulging and mild facet hypertrophy without significant spinal stenosis.  L5-S1: Moderate disc degeneration with disc space narrowing and spondylosis. No significant spinal or foraminal stenosis.  IMPRESSION: Severe right hydronephrosis and hydroureter. Further evaluation is recommended to determine the cause of obstruction. No prior imaging available. CT abdomen pelvis with contrast suggested. The patient has a history of uterine cancer and stricture or  tumor could be present causing obstruction of the distal right ureter.  Extruded disc fragment L1-2 with cranial migration of disc material. No significant nerve root impingement  Disc and facet degeneration L2-3 without stenosis  Pedicle screw and interbody fusion L3-4 without significant spinal stenosis.  Disc and facet degeneration L4-5 and L5-S1 without significant stenosis.   Electronically Signed   By: Franchot Gallo M.D.   On: 10/11/2014 13:32   Mm Digital Diagnostic Unilat L  10/13/2014   CLINICAL DATA:  81 year old female status post ultrasound-guided left breast biopsy  EXAM: DIAGNOSTIC LEFT MAMMOGRAM POST ULTRASOUND BIOPSY  COMPARISON:  Previous exam(s).  FINDINGS: Mammographic images were obtained following ultrasound guided biopsy of  a suspicious left breast mass at 9 o'clock, 4 cm from the nipple. Post biopsy mammogram demonstrates the wing shaped biopsy marker in the expected location within the medial left breast.  IMPRESSION: Satisfactory marker placement post ultrasound-guided left breast biopsy.  Final Assessment: Post Procedure Mammograms for Marker Placement   Electronically Signed   By: Pamelia Hoit M.D.   On: 10/13/2014 11:50   US Breast Ltd Uni Left Inc Axilla  10/07/2014   CLINICAL DATA:  Patient was called back from screening mammogram for a possible mass in the left breast.  EXAM: DIGITAL DIAGNOSTIC LEFT MAMMOGRAM WITH 3D TOMOSYNTHESIS WITH CAD  ULTRASOUND LEFT BREAST  COMPARISON:  Previous exam(s).  ACR Breast Density Category c: The breast tissue is heterogeneously dense, which may obscure small masses.  FINDINGS: Additional imaging of the left breast was performed. There is persistence of a 7 mm spiculated mass in the medial aspect of the breast. There are no malignant type microcalcifications.  Mammographic images were processed with CAD.  On physical exam, I do not palpate a mass in medial aspect of the left breast.  Targeted ultrasound is performed, showing 5 x 4 x 5 mm hypoechoic  mass in the left breast at 9 o'clock 4 cm from the nipple. Sonographic evaluation of the left axilla does not show any enlarged adenopathy.  IMPRESSION: Suspicious left breast mass.  RECOMMENDATION: Ultrasound-guided core biopsy of the left breast mass is recommended. The patient is currently on Plavix and states that she usually discontinues the medication 5 days prior to a procedure.  I have discussed the findings and recommendations with the patient. Results were also provided in writing at the conclusion of the visit. If applicable, a reminder letter will be sent to the patient regarding the next appointment.  BI-RADS CATEGORY  4: Suspicious.   Electronically Signed   By: Lillia Mountain M.D.   On: 10/07/2014 16:03   Mm Diag Breast Tomo Uni Left  10/07/2014   CLINICAL DATA:  Patient was called back from screening mammogram for a possible mass in the left breast.  EXAM: DIGITAL DIAGNOSTIC LEFT MAMMOGRAM WITH 3D TOMOSYNTHESIS WITH CAD  ULTRASOUND LEFT BREAST  COMPARISON:  Previous exam(s).  ACR Breast Density Category c: The breast tissue is heterogeneously dense, which may obscure small masses.  FINDINGS: Additional imaging of the left breast was performed. There is persistence of a 7 mm spiculated mass in the medial aspect of the breast. There are no malignant type microcalcifications.  Mammographic images were processed with CAD.  On physical exam, I do not palpate a mass in medial aspect of the left breast.  Targeted ultrasound is performed, showing 5 x 4 x 5 mm hypoechoic mass in the left breast at 9 o'clock 4 cm from the nipple. Sonographic evaluation of the left axilla does not show any enlarged adenopathy.  IMPRESSION: Suspicious left breast mass.  RECOMMENDATION: Ultrasound-guided core biopsy of the left breast mass is recommended. The patient is currently on Plavix and states that she usually discontinues the medication 5 days prior to a procedure.  I have discussed the findings and recommendations with  the patient. Results were also provided in writing at the conclusion of the visit. If applicable, a reminder letter will be sent to the patient regarding the next appointment.  BI-RADS CATEGORY  4: Suspicious.   Electronically Signed   By: Lillia Mountain M.D.   On: 10/07/2014 16:03   [place] Lt Breast Bx W Loc Dev 1st Lesion Img Bx  Spec US Guide  10/15/2014   ADDENDUM REPORT: 10/15/2014 14:58  ADDENDUM: Pathology results indicate grade 1 invasive mammary carcinoma. This was determined to be concordant by Dr. Brigitte Pulse. The patient was contacted with the results on 10/14/2014 by Bryson Dames, CMA on 10/14/2014. The patient has been referred to oncology at [facility] by Dr. Fulton Reek.   Electronically Signed   By: Ammie Ferrier M.D.   On: 10/15/2014 14:58   10/15/2014   CLINICAL DATA:  81 year old female with suspicious left breast mass at 9 o'clock  EXAM: ULTRASOUND GUIDED LEFT BREAST CORE NEEDLE BIOPSY  COMPARISON:  Previous exam(s).  PROCEDURE: I met with the patient and we discussed the procedure of ultrasound-guided biopsy, including benefits and alternatives. We discussed the high likelihood of a successful procedure. We discussed the risks of the procedure including infection, bleeding, tissue injury, clip migration, and inadequate sampling. Informed written consent was given. The usual time-out protocol was performed immediately prior to the procedure.  Using sterile technique and 1% Lidocaine as local anesthetic, under direct ultrasound visualization, a 12 gauge vacuum-assisted device was used to perform biopsy of a suspicious left breast mass at 9 o'clock, 4 cm from the nipple using a medial to lateral approach. The patient reported a remote history of having passed out in the past after being given Novocain. The patient tolerated lidocaine without complication. At the conclusion of the procedure, a wing shaped tissue marker clip was deployed into the biopsy cavity. Follow-up 2-view mammogram was performed and  dictated separately.  IMPRESSION: Ultrasound-guided biopsy of a suspicious left breast mass at 9 o'clock. No apparent complications.  Electronically Signed: By: Pamelia Hoit M.D. On: 10/13/2014 11:51    ASSESSMENT:  Carcinoma of left breast at 9:00 position clinically stage is T1b N0 M0 tumor estrogen receptor positive progesterone receptor positive HER-2/neu receptor pending by fish PLAN:  \\  Recent MRI has been reviewed as right hydronephrosis Cystoscopy report has been reviewed shows bladder tumor which is blocking the right ureter and unsuccessful attempt of stent placement Biopsies positive for recurrent endometrial cancer I informed this finding with the patient to the patient and family is very difficult for patient to understand why uterine cancer is recurring at present time. When  was all removed in the past I had prolonged discussion regarding the nature of disease. At this point in time regarding pain management and I suggested Use of OxyContin 10 mg every 12 hourly followed by breakthrough pain medication with oxycodone 2.  Complete restaging with CT scan of chest abdomen and pelvis 3.  Proceed with percutaneous nephrostomy tube placement to see whether that would help relieve some of the pain Total duration of visit was 45 minutes.  50% or more time was spent in counseling patient and family regarding prognosis and options of treatment and available resources  Discussion continues with urology somewhat percutaneous nephrostomy tube placement Bladder tissue would be sent for estrogen receptor assay We will also make an appointment for GYN oncology for evaluation We will also review the record of previous radiation therapy   Patient expressed understanding and was in agreement with this plan. She also understands that She can call clinic at any time with any questions, concerns, or complaints.    Cancer of left breast   Staging form: Breast, AJCC 7th Edition     Clinical:  Stage IA (T1b, N0, M0) - Signed by Forest Gleason, MD on 10/20/2014   Forest Gleason, MD   11/06/2014 11:56 AM

## 2014-11-09 ENCOUNTER — Ambulatory Visit
Admission: RE | Admit: 2014-11-09 | Discharge: 2014-11-09 | Disposition: A | Discharge status: Home / Self Care | Payer: Medicare Other | Source: Ambulatory Visit | Attending: Oncology | Admitting: Oncology

## 2014-11-09 DIAGNOSIS — N1330 Unspecified hydronephrosis: Secondary | ICD-10-CM | POA: Insufficient documentation

## 2014-11-09 DIAGNOSIS — Z8542 Personal history of malignant neoplasm of other parts of uterus: Secondary | ICD-10-CM

## 2014-11-09 DIAGNOSIS — C7951 Secondary malignant neoplasm of bone: Secondary | ICD-10-CM | POA: Insufficient documentation

## 2014-11-09 DIAGNOSIS — C541 Malignant neoplasm of endometrium: Secondary | ICD-10-CM | POA: Insufficient documentation

## 2014-11-09 DIAGNOSIS — R590 Localized enlarged lymph nodes: Secondary | ICD-10-CM | POA: Diagnosis not present

## 2014-11-09 DIAGNOSIS — C50912 Malignant neoplasm of unspecified site of left female breast: Secondary | ICD-10-CM | POA: Diagnosis present

## 2014-11-09 LAB — POCT I-STAT CREATININE: Creatinine, Ser: 1.2 mg/dL — ABNORMAL HIGH (ref 0.44–1.00)

## 2014-11-09 MED ORDER — IOHEXOL 300 MG/ML  SOLN
75.0000 mL | Freq: Once | INTRAMUSCULAR | Status: AC | PRN
  Administered 2014-11-09: 75 mL via INTRAVENOUS

## 2014-11-10 ENCOUNTER — Other Ambulatory Visit: Payer: Medicare Other

## 2014-11-11 ENCOUNTER — Inpatient Hospital Stay: Payer: Medicare Other | Attending: Oncology | Admitting: Oncology

## 2014-11-11 ENCOUNTER — Encounter: Payer: Self-pay | Admitting: Oncology

## 2014-11-11 VITALS — BP 128/69 | HR 64 | Temp 95.4°F | Wt 127.4 lb

## 2014-11-11 DIAGNOSIS — C541 Malignant neoplasm of endometrium: Secondary | ICD-10-CM | POA: Insufficient documentation

## 2014-11-11 DIAGNOSIS — K5900 Constipation, unspecified: Secondary | ICD-10-CM | POA: Diagnosis not present

## 2014-11-11 DIAGNOSIS — C7951 Secondary malignant neoplasm of bone: Secondary | ICD-10-CM | POA: Diagnosis not present

## 2014-11-11 DIAGNOSIS — K219 Gastro-esophageal reflux disease without esophagitis: Secondary | ICD-10-CM | POA: Insufficient documentation

## 2014-11-11 DIAGNOSIS — N189 Chronic kidney disease, unspecified: Secondary | ICD-10-CM | POA: Diagnosis not present

## 2014-11-11 DIAGNOSIS — M79604 Pain in right leg: Secondary | ICD-10-CM | POA: Insufficient documentation

## 2014-11-11 DIAGNOSIS — N1330 Unspecified hydronephrosis: Secondary | ICD-10-CM | POA: Diagnosis not present

## 2014-11-11 DIAGNOSIS — Z90710 Acquired absence of both cervix and uterus: Secondary | ICD-10-CM | POA: Diagnosis not present

## 2014-11-11 DIAGNOSIS — C7911 Secondary malignant neoplasm of bladder: Secondary | ICD-10-CM | POA: Insufficient documentation

## 2014-11-11 DIAGNOSIS — Z90722 Acquired absence of ovaries, bilateral: Secondary | ICD-10-CM | POA: Diagnosis not present

## 2014-11-11 DIAGNOSIS — Z79899 Other long term (current) drug therapy: Secondary | ICD-10-CM | POA: Diagnosis not present

## 2014-11-11 DIAGNOSIS — Z86010 Personal history of colonic polyps: Secondary | ICD-10-CM | POA: Diagnosis not present

## 2014-11-11 DIAGNOSIS — Z923 Personal history of irradiation: Secondary | ICD-10-CM | POA: Insufficient documentation

## 2014-11-11 DIAGNOSIS — F329 Major depressive disorder, single episode, unspecified: Secondary | ICD-10-CM | POA: Insufficient documentation

## 2014-11-11 DIAGNOSIS — Z170 Estrogen receptor positive status [ER+]: Secondary | ICD-10-CM | POA: Insufficient documentation

## 2014-11-11 DIAGNOSIS — M25551 Pain in right hip: Secondary | ICD-10-CM | POA: Diagnosis not present

## 2014-11-11 DIAGNOSIS — Z8542 Personal history of malignant neoplasm of other parts of uterus: Secondary | ICD-10-CM

## 2014-11-11 DIAGNOSIS — G893 Neoplasm related pain (acute) (chronic): Secondary | ICD-10-CM | POA: Insufficient documentation

## 2014-11-11 DIAGNOSIS — C50812 Malignant neoplasm of overlapping sites of left female breast: Secondary | ICD-10-CM | POA: Insufficient documentation

## 2014-11-11 DIAGNOSIS — R590 Localized enlarged lymph nodes: Secondary | ICD-10-CM | POA: Diagnosis not present

## 2014-11-11 DIAGNOSIS — C7989 Secondary malignant neoplasm of other specified sites: Secondary | ICD-10-CM | POA: Insufficient documentation

## 2014-11-11 NOTE — Progress Notes (Signed)
Patient does have living will.  Never smoked.  Patient here today for results.

## 2014-11-12 NOTE — Patient Instructions (Signed)

## 2014-11-13 ENCOUNTER — Ambulatory Visit
Admission: RE | Admit: 2014-11-13 | Discharge: 2014-11-13 | Disposition: A | Discharge status: Home / Self Care | Payer: Medicare Other | Source: Ambulatory Visit | Attending: Surgery | Admitting: Surgery

## 2014-11-13 ENCOUNTER — Other Ambulatory Visit: Payer: Self-pay | Admitting: Urology

## 2014-11-13 ENCOUNTER — Ambulatory Visit
Admission: RE | Admit: 2014-11-13 | Discharge: 2014-11-13 | Disposition: A | Discharge status: Home / Self Care | Payer: Medicare Other | Source: Ambulatory Visit | Attending: Urology | Admitting: Urology

## 2014-11-13 ENCOUNTER — Encounter: Admission: RE | Payer: Self-pay | Source: Ambulatory Visit

## 2014-11-13 ENCOUNTER — Ambulatory Visit
Admission: RE | Admit: 2014-11-13 | Discharge: 2014-11-13 | Disposition: A | Discharge status: Home / Self Care | Payer: Medicare Other | Source: Ambulatory Visit | Attending: Diagnostic Radiology | Admitting: Diagnostic Radiology

## 2014-11-13 ENCOUNTER — Ambulatory Visit: Admission: RE | Admit: 2014-11-13 | Payer: Medicare Other | Source: Ambulatory Visit | Admitting: Surgery

## 2014-11-13 ENCOUNTER — Other Ambulatory Visit: Payer: Self-pay | Admitting: Surgery

## 2014-11-13 DIAGNOSIS — E890 Postprocedural hypothyroidism: Secondary | ICD-10-CM | POA: Diagnosis not present

## 2014-11-13 DIAGNOSIS — H9190 Unspecified hearing loss, unspecified ear: Secondary | ICD-10-CM | POA: Diagnosis not present

## 2014-11-13 DIAGNOSIS — Z86718 Personal history of other venous thrombosis and embolism: Secondary | ICD-10-CM | POA: Insufficient documentation

## 2014-11-13 DIAGNOSIS — M25551 Pain in right hip: Secondary | ICD-10-CM | POA: Diagnosis not present

## 2014-11-13 DIAGNOSIS — C50912 Malignant neoplasm of unspecified site of left female breast: Secondary | ICD-10-CM

## 2014-11-13 DIAGNOSIS — R1011 Right upper quadrant pain: Secondary | ICD-10-CM | POA: Diagnosis not present

## 2014-11-13 DIAGNOSIS — Z823 Family history of stroke: Secondary | ICD-10-CM | POA: Insufficient documentation

## 2014-11-13 DIAGNOSIS — Z90710 Acquired absence of both cervix and uterus: Secondary | ICD-10-CM | POA: Diagnosis not present

## 2014-11-13 DIAGNOSIS — Z436 Encounter for attention to other artificial openings of urinary tract: Secondary | ICD-10-CM

## 2014-11-13 DIAGNOSIS — Z86010 Personal history of colonic polyps: Secondary | ICD-10-CM | POA: Diagnosis not present

## 2014-11-13 DIAGNOSIS — N1330 Unspecified hydronephrosis: Secondary | ICD-10-CM | POA: Diagnosis present

## 2014-11-13 DIAGNOSIS — Z884 Allergy status to anesthetic agent status: Secondary | ICD-10-CM | POA: Insufficient documentation

## 2014-11-13 DIAGNOSIS — Z880 Allergy status to penicillin: Secondary | ICD-10-CM | POA: Diagnosis not present

## 2014-11-13 DIAGNOSIS — C50919 Malignant neoplasm of unspecified site of unspecified female breast: Secondary | ICD-10-CM | POA: Diagnosis not present

## 2014-11-13 DIAGNOSIS — R102 Pelvic and perineal pain: Secondary | ICD-10-CM | POA: Diagnosis not present

## 2014-11-13 DIAGNOSIS — M1990 Unspecified osteoarthritis, unspecified site: Secondary | ICD-10-CM | POA: Diagnosis not present

## 2014-11-13 DIAGNOSIS — Z79899 Other long term (current) drug therapy: Secondary | ICD-10-CM | POA: Diagnosis not present

## 2014-11-13 DIAGNOSIS — Z882 Allergy status to sulfonamides status: Secondary | ICD-10-CM | POA: Insufficient documentation

## 2014-11-13 DIAGNOSIS — Z803 Family history of malignant neoplasm of breast: Secondary | ICD-10-CM | POA: Insufficient documentation

## 2014-11-13 DIAGNOSIS — K219 Gastro-esophageal reflux disease without esophagitis: Secondary | ICD-10-CM | POA: Diagnosis not present

## 2014-11-13 DIAGNOSIS — Z8542 Personal history of malignant neoplasm of other parts of uterus: Secondary | ICD-10-CM | POA: Diagnosis not present

## 2014-11-13 DIAGNOSIS — C7951 Secondary malignant neoplasm of bone: Secondary | ICD-10-CM | POA: Diagnosis not present

## 2014-11-13 DIAGNOSIS — Z9841 Cataract extraction status, right eye: Secondary | ICD-10-CM | POA: Insufficient documentation

## 2014-11-13 DIAGNOSIS — Z885 Allergy status to narcotic agent status: Secondary | ICD-10-CM | POA: Diagnosis not present

## 2014-11-13 DIAGNOSIS — I69320 Aphasia following cerebral infarction: Secondary | ICD-10-CM | POA: Diagnosis not present

## 2014-11-13 DIAGNOSIS — M79604 Pain in right leg: Secondary | ICD-10-CM | POA: Diagnosis not present

## 2014-11-13 DIAGNOSIS — K831 Obstruction of bile duct: Secondary | ICD-10-CM

## 2014-11-13 LAB — CBC
HCT: 39.8 % (ref 35.0–47.0)
HEMOGLOBIN: 13.3 g/dL (ref 12.0–16.0)
MCH: 31.4 pg (ref 26.0–34.0)
MCHC: 33.4 g/dL (ref 32.0–36.0)
MCV: 94 fL (ref 80.0–100.0)
Platelets: 255 10*3/uL (ref 150–440)
RBC: 4.24 MIL/uL (ref 3.80–5.20)
RDW: 13 % (ref 11.5–14.5)
WBC: 5.4 10*3/uL (ref 3.6–11.0)

## 2014-11-13 LAB — PROTIME-INR
INR: 0.97
PROTHROMBIN TIME: 13.1 s (ref 11.4–15.0)

## 2014-11-13 LAB — APTT: aPTT: 31 seconds (ref 24–36)

## 2014-11-13 SURGERY — BIOPSY, LYMPH NODE, SENTINEL
Anesthesia: Choice | Laterality: Left

## 2014-11-13 MED ORDER — FENTANYL CITRATE (PF) 100 MCG/2ML IJ SOLN: 25.0000 ug | INTRAMUSCULAR | Status: DC | PRN

## 2014-11-13 MED ORDER — CIPROFLOXACIN IN D5W 400 MG/200ML IV SOLN
INTRAVENOUS | Status: AC
  Filled 2014-11-13: qty 200

## 2014-11-13 MED ORDER — SODIUM CHLORIDE 0.9 % IV SOLN: INTRAVENOUS | Status: DC

## 2014-11-13 MED ORDER — LIDOCAINE HCL (PF) 1 % IJ SOLN
INTRAMUSCULAR | Status: AC
  Filled 2014-11-13: qty 10

## 2014-11-13 MED ORDER — SODIUM CHLORIDE 0.9 % IV SOLN
INTRAVENOUS | Status: DC
  Administered 2014-11-13: 08:00:00 via INTRAVENOUS

## 2014-11-13 MED ORDER — SODIUM CHLORIDE 0.9 % IV SOLN: 4.0000 mg | Freq: Once | INTRAVENOUS | Status: DC | PRN

## 2014-11-13 MED ORDER — FENTANYL CITRATE (PF) 100 MCG/2ML IJ SOLN
INTRAMUSCULAR | Status: AC | PRN
  Administered 2014-11-13: 50 ug via INTRAVENOUS

## 2014-11-13 MED ORDER — MIDAZOLAM HCL 5 MG/5ML IJ SOLN
INTRAMUSCULAR | Status: AC
  Filled 2014-11-13: qty 10

## 2014-11-13 MED ORDER — OXYCODONE HCL 5 MG PO TABS
5.0000 mg | ORAL_TABLET | ORAL | Status: DC | PRN
Start: 2014-11-13 — End: 2014-11-14

## 2014-11-13 MED ORDER — OXYCODONE HCL 5 MG PO TABS
5.0000 mg | ORAL_TABLET | Freq: Once | ORAL | Status: AC
  Administered 2014-11-13: 5 mg via ORAL

## 2014-11-13 MED ORDER — FENTANYL CITRATE (PF) 100 MCG/2ML IJ SOLN
INTRAMUSCULAR | Status: AC
  Filled 2014-11-13: qty 4

## 2014-11-13 MED ORDER — IOHEXOL 300 MG/ML  SOLN
30.0000 mL | Freq: Once | INTRAMUSCULAR | Status: DC | PRN
  Administered 2014-11-13: 10 mL
  Filled 2014-11-13: qty 30

## 2014-11-13 MED ORDER — CIPROFLOXACIN IN D5W 400 MG/200ML IV SOLN
400.0000 mg | Freq: Two times a day (BID) | INTRAVENOUS | Status: DC
  Administered 2014-11-13: 400 mg via INTRAVENOUS
  Filled 2014-11-13 (×2): qty 200

## 2014-11-13 NOTE — H&P (Signed)
Chief Complaint: Right hydronephrosis and referred for percutaneous nephrostomy and ureter stent placement  Referring Physician(s): [name]  History of Present Illness: Traci [name] is a 81 y.o. female with history of endometrial cancer and recently diagnosed left breast cancer.  Patient has severe right leg pain and during workup found to have severe right hydronephrosis, large lesion in right ilium, pelvic lesions and bladder lesions.  Bladder biopsy was positive for recurrent endometrial cancer.  Patient has severe right hip and leg pain.  She has difficulty resting and sleeping due to the pain.  She says the pain isn't as bad as usual, but still calls it 8/10.  She has minimal right upper flank pain.  Past Medical History  Diagnosis Date  . History of esophageal stricture   . GERD (gastroesophageal reflux disease)   . Arthritis   . Endometrial adenocarcinoma ([place]) 01/2012     stage Ib, grade 1, positive peritoneal cytology, no other risk factors or extra-uterine disease  . Inguinal lymphocyst   . History of brachytherapy   . History of shingles   . History of colon polyps   . Mild aphasia   . Hearing loss   . History of DVT (deep vein thrombosis)   . Cancer of left breast ([place]) 10/20/2014  . Stroke ([place]) 04/06/2011    residual:  aphasia  . Hiccups   . Pain     SEVERE CHRONIC RIGHT LEG,HIP,FOOT  . Chronic kidney disease     HYDRONEPHROSIS    Past Surgical History  Procedure Laterality Date  . Esophageal dilation    . Thyroidectomy, partial    . Cataract extraction w/ intraocular lens implant      right  . Back surgery      fusion of L3&4  . Tonsillectomy    . Total abdominal hysterectomy w/ bilateral salpingoophorectomy Bilateral     staging biopsies  . Breast cyst aspiration N/A   . Breast biopsy Left 10/13/2014    path pending  . Cystoscopy w/ retrogrades Right 10/28/2014    Procedure: CYSTOSCOPY WITH RETROGRADE PYELOGRAM ATTEMPT, UNABLE TO  PASS ;  Surgeon: [name], MD;  Location: [facility] ORS;  Service: Urology;  Laterality: Right;  . Cystoscopy with stent placement Right 10/28/2014    Procedure: BLADDER BIOPSY WITH FULGERATION ;  Surgeon: [name], MD;  Location: [facility];  Service: Urology;  Laterality: Right;  . Thyroid removed      Allergies: Penicillins; Hydrocodone-acetaminophen; Novocain; and Sulfa antibiotics  Medications: Prior to Admission medications   Medication Sig Start Date End Date Taking? Authorizing Provider  calcium carbonate (OS-CAL) 1250 MG chewable tablet Chew 1 tablet by mouth daily.   Yes Historical Provider, MD  gabapentin (NEURONTIN) 300 MG capsule Take 1 capsule by mouth 2 (two) times daily. 09/10/14  Yes Historical Provider, MD  Multiple Vitamins-Minerals (MULTIVITAMIN WITH MINERALS) tablet Take 1 tablet by mouth daily.   Yes Historical Provider, MD  OxyCODONE (OXYCONTIN) 10 mg T12A 12 hr tablet Take 1 tablet (10 mg total) by mouth every 12 (twelve) hours. 11/03/14  Yes Forest Gleason, MD  oxyCODONE-acetaminophen (PERCOCET/ROXICET) 5-325 MG per tablet Take 1 tablet by mouth every 6 (six) hours as needed. 08/14/14  Yes Historical Provider, MD  simethicone (MYLICON) 80 MG chewable tablet Chew 80 mg by mouth daily.   Yes Historical Provider, MD  clopidogrel (PLAVIX) 75 MG tablet Take 1 tablet by mouth daily. 05/27/14   Historical Provider, MD     Family  History  Problem Relation Age of Onset  . Stroke Mother   . Breast cancer Maternal Aunt     x 2    Social History   Social History  . Marital Status: Married    Spouse Name: N/A  . Number of Children: N/A  . Years of Education: N/A   Social History Main Topics  . Smoking status: Never Smoker   . Smokeless tobacco: Never Used  . Alcohol Use: No  . Drug Use: No  . Sexual Activity:    Partners: Male   Other Topics Concern  . None   Social History Narrative     Review of Systems  Respiratory: Negative.   Gastrointestinal:        Mild abdominal pain.   Musculoskeletal:       Severe right hip and right leg pain.    Vital Signs: BP 137/74 mmHg  Pulse 59  Temp(Src) 98.2 F (36.8 C) (Oral)  Resp 18  Ht 5\' 1"  (1.549 m)  Wt 127 lb (57.607 kg)  BMI 24.01 kg/m2  SpO2 98%  Physical Exam  Cardiovascular: Normal rate, regular rhythm and normal heart sounds.   Pulmonary/Chest: Effort normal and breath sounds normal.  Abdominal: Soft. Bowel sounds are normal. She exhibits no distension.    Mallampati Score:  MD Evaluation Airway: WNL Heart: WNL Abdomen: WNL ASA  Classification: 3 Mallampati/Airway Score: Three  Imaging: Ct Chest W Contrast  11/09/2014   CLINICAL DATA:  Recurrent endometrial carcinoma. Newly diagnosed left breast carcinoma. Right hydronephrosis.  EXAM: CT CHEST, ABDOMEN, AND PELVIS WITH CONTRAST  TECHNIQUE: Multidetector CT imaging of the chest, abdomen and pelvis was performed following the standard protocol during bolus administration of intravenous contrast.  CONTRAST:  56mL OMNIPAQUE IOHEXOL 300 MG/ML  SOLN  COMPARISON:  AP CT on 04/04/2012  FINDINGS: CT CHEST FINDINGS  Mediastinum/Lymph Nodes: No masses, pathologically enlarged lymph nodes, or other significant abnormality.  Lungs/Pleura: No pulmonary mass, infiltrate, or effusion. Biapical and right lower lobe scarring noted.  Musculoskeletal: No chest wall mass or suspicious bone lesions identified.  CT ABDOMEN PELVIS FINDINGS  Hepatobiliary: No masses or other significant abnormality.  Pancreas: No mass, inflammatory changes, or other significant abnormality.  Spleen: Within normal limits in size and appearance.  Adrenals/Urinary Tract: No masses identified. Diffuse right renal parenchymal atrophy is seen with severe right hydroureteronephrosis which is new since previous study.  Stomach/Bowel: No evidence of obstruction, inflammatory process, or abnormal fluid collections.  Vascular/Lymphatic: Bilateral iliac lymphadenopathy is seen within  the pelvis, right side greater than left. Largest area measures 2.3 cm on image 94 of series 2. 1.5 cm soft tissue nodule or lymphadenopathy is also seen in the area the sigmoid mesocolon on image 93/series 2.  Reproductive: Prior hysterectomy noted. 1.5 cm soft tissue nodule is seen along the left vaginal cuff, suspicious for recurrent carcinoma.  Other: None.  Musculoskeletal: A lytic bone metastasis is seen involving the right ilium with associated soft tissue mass in the right iliopsoas region measuring 6.7 x 5.5 cm.  IMPRESSION: Bilateral pelvic lymphadenopathy, right side greater than left, consistent with metastatic disease.  Small soft tissue nodule involving the left vaginal cuff and 1.5 cm soft tissue nodule or lymphadenopathy in the sigmoid mesocolon, consistent with carcinoma.  Right iliac bone metastasis with associated iliopsoas soft tissue mass.  Severe right hydronephrosis and diffuse right renal parenchymal atrophy secondary to pelvic metastatic disease.  No metastatic disease identified within the abdomen or chest.   Electronically Signed  By: Earle Gell M.D.   On: 11/09/2014 11:19   Ct Abdomen Pelvis W Contrast  11/09/2014   CLINICAL DATA:  Recurrent endometrial carcinoma. Newly diagnosed left breast carcinoma. Right hydronephrosis.  EXAM: CT CHEST, ABDOMEN, AND PELVIS WITH CONTRAST  TECHNIQUE: Multidetector CT imaging of the chest, abdomen and pelvis was performed following the standard protocol during bolus administration of intravenous contrast.  CONTRAST:  39mL OMNIPAQUE IOHEXOL 300 MG/ML  SOLN  COMPARISON:  AP CT on 04/04/2012  FINDINGS: CT CHEST FINDINGS  Mediastinum/Lymph Nodes: No masses, pathologically enlarged lymph nodes, or other significant abnormality.  Lungs/Pleura: No pulmonary mass, infiltrate, or effusion. Biapical and right lower lobe scarring noted.  Musculoskeletal: No chest wall mass or suspicious bone lesions identified.  CT ABDOMEN PELVIS FINDINGS  Hepatobiliary: No  masses or other significant abnormality.  Pancreas: No mass, inflammatory changes, or other significant abnormality.  Spleen: Within normal limits in size and appearance.  Adrenals/Urinary Tract: No masses identified. Diffuse right renal parenchymal atrophy is seen with severe right hydroureteronephrosis which is new since previous study.  Stomach/Bowel: No evidence of obstruction, inflammatory process, or abnormal fluid collections.  Vascular/Lymphatic: Bilateral iliac lymphadenopathy is seen within the pelvis, right side greater than left. Largest area measures 2.3 cm on image 94 of series 2. 1.5 cm soft tissue nodule or lymphadenopathy is also seen in the area the sigmoid mesocolon on image 93/series 2.  Reproductive: Prior hysterectomy noted. 1.5 cm soft tissue nodule is seen along the left vaginal cuff, suspicious for recurrent carcinoma.  Other: None.  Musculoskeletal: A lytic bone metastasis is seen involving the right ilium with associated soft tissue mass in the right iliopsoas region measuring 6.7 x 5.5 cm.  IMPRESSION: Bilateral pelvic lymphadenopathy, right side greater than left, consistent with metastatic disease.  Small soft tissue nodule involving the left vaginal cuff and 1.5 cm soft tissue nodule or lymphadenopathy in the sigmoid mesocolon, consistent with carcinoma.  Right iliac bone metastasis with associated iliopsoas soft tissue mass.  Severe right hydronephrosis and diffuse right renal parenchymal atrophy secondary to pelvic metastatic disease.  No metastatic disease identified within the abdomen or chest.   Electronically Signed   By: Earle Gell M.D.   On: 11/09/2014 11:19    Labs:  CBC:  Recent Labs  11/13/14 0713  WBC 5.4  HGB 13.3  HCT 39.8  PLT 255    COAGS:  Recent Labs  02/09/14 0940  INR 1.0    BMP:  Recent Labs  03/05/14 1156 11/09/14 1027  K 4.3  --   CREATININE  --  1.20*    LIVER FUNCTION TESTS: No results for input(s): BILITOT, AST, ALT,  ALKPHOS, PROT, ALBUMIN in the last 8760 hours.  TUMOR MARKERS: No results for input(s): AFPTM, CEA, CA199, CHROMGRNA in the last 8760 hours.  Assessment and Plan:  81 yo with recurrent metastatic endometrial cancer and recently diagnosed left breast cancer.  Patient has severe right hydronephrosis, likely from recurrent endometrial cancer.  Discussed percutaneous nephrostomy and placement of antegrade ureter stent with patient and husband in depth.  Due to the severe hydronephrosis and tortuous right ureter, plan for a staged approach.  Plan for right percutaneous nephrostomy today and likely antegrade stent placement/attempt placement in one week after decompression.   Patient is agreeable to this procedure and understands the risks (which include bleeding and infection).  Will give IV antibiotics for procedure.      SignedCarylon Perches 11/13/2014, 7:53 AM

## 2014-11-13 NOTE — Procedures (Signed)
Post-Procedure Note  Pre-operative Diagnosis: Metastatic endometrial cancer with right ureter obtstruction       Post-operative Diagnosis: Same   Indications: Severe right hydronephrosis.  Procedure Details:   Right percutaneous nephrostomy tube placement without complication.    Findings: Severe right hydronephrosis.  Placed 10 Fr drain and removed 150 ml of urine.   Complications: None     Condition:Good  Plan: Recovery for 3 hours, then discharge home.   Plan for ureteral stent placement in one week.

## 2014-11-14 ENCOUNTER — Encounter: Payer: Self-pay | Admitting: Oncology

## 2014-11-14 DIAGNOSIS — N39490 Overflow incontinence: Secondary | ICD-10-CM | POA: Insufficient documentation

## 2014-11-14 DIAGNOSIS — M51379 Other intervertebral disc degeneration, lumbosacral region without mention of lumbar back pain or lower extremity pain: Secondary | ICD-10-CM | POA: Insufficient documentation

## 2014-11-14 DIAGNOSIS — M5137 Other intervertebral disc degeneration, lumbosacral region: Secondary | ICD-10-CM | POA: Insufficient documentation

## 2014-11-14 DIAGNOSIS — E7800 Pure hypercholesterolemia, unspecified: Secondary | ICD-10-CM | POA: Insufficient documentation

## 2014-11-14 NOTE — Progress Notes (Signed)
[place] @ [facility] Telephone:(336) [phone]  Fax:(336) [phone]  Progress note  Traci [name] OB: 06/29/1929  MR#: [id number]  QAS#:[id number]  Patient Care Team: Idelle Crouch, MD as PCP - General (Unknown Physician Specialty) [name], MD as Referring Physician (Surgery) [name], MD as Consulting Physician (Urology)  CHIEF COMPLAINT:  Chief Complaint  Patient presents with  . OTHER  1/  VISIT DIAGNOSIS:   No diagnosis found.  Oncology History   1.  Carcinoma of left breast  IMPRESSION: Ultrasound-guided biopsy of a suspicious left breast mass at 9 o'clock. No apparent complications.   2.  Estrogen receptor positive.  Progesterone receptor positive.  HER-2 receptor 2+ by  IHC . Fish is pending  5 mm tumor clinically stage IB N0 M0 tumor   3.  Patient has a previous history of endometrium  carcinoma of endometrium stage IB disease status post bilateral salpingo-oophorectomy and radiation therapy 4.  Right hydronephrosis detected on MRI scan off lumbosacral spine.  Cystoscopy with attempted stent placement revealed bladder tumor in September of 2016 biopsies positive for recurrent endometrial cancer  Oncology Flowsheet 04/08/2011 10/28/2014  dexamethasone (DECADRON) IJ - -  enoxaparin (LOVENOX) [place] 40 mg -  ondansetron (ZOFRAN) IV - -    INTERVAL HISTORY:  81 year old lady was getting regular mammogram done.  Patient had abnormal mammogram initial biopsies showed 5 mm tumor at 9:00 position in the left breast.  Biopsy of which is positive for invasive carcinoma patient was referred to me for preop treatment planning.  Patient had a previous history of carcinoma of endometrium.  Patient is not on any anti-hormonal therapy.  And does not have any significant family history of breast cancer.  Patient and family is here to discuss results of the CT scan and further planning of treatment. Patient is scheduled for percutaneous nephrostomy on Friday.   Family is extremely concerned and confused about recurrence of endometrial cancer Pain in the right hip continues.  REVIEW OF SYSTEMS:   GENERAL:  Feels good.  Active.  No fevers, sweats or weight loss. PERFORMANCE STATUS (ECOG):  01 HEENT:  No visual changes, runny nose, sore throat, mouth sores or tenderness. Lungs: No shortness of breath or cough.  No hemoptysis. Cardiac:  No chest pain, palpitations, orthopnea, or PND. Right flank pain.  Right low back pain.  Right pain radiating down her right lower extremity.  Recent cystoscopy is consistent with bladder tumor.  Biopsies positive for endometrial cancer  Musculoskeletal:  No back pain.  No joint pain.  No muscle tenderness. Extremities:  No pain or swelling. Skin:  No rashes or skin changes. Neuro:  No headache, numbness or weakness, balance or coordination issues. Endocrine:  No diabetes, thyroid issues, hot flashes or night sweats. Psych:  No mood changes, depression or anxiety. Pain: Right hip area.  Persistent dull aching.  Patient is taking OxyContin which will be increased Review of systems:  All other systems reviewed and found to be negative.  As per HPI. Otherwise, a complete review of systems is negatve.  PAST MEDICAL HISTORY: Past Medical History  Diagnosis Date  . History of esophageal stricture   . GERD (gastroesophageal reflux disease)   . Arthritis   . Endometrial adenocarcinoma (Traci [name]) 01/2012     stage Ib, grade 1, positive peritoneal cytology, no other risk factors or extra-uterine disease  . Inguinal lymphocyst   . History of brachytherapy   . History of shingles   . History of colon polyps   .  Mild aphasia   . Hearing loss   . History of DVT (deep vein thrombosis)   . Cancer of left breast (Traci [name]) 10/20/2014  . Stroke ([place]) 04/06/2011    residual:  aphasia  . Hiccups   . Pain     SEVERE CHRONIC RIGHT LEG,HIP,FOOT  . Chronic kidney disease     HYDRONEPHROSIS    PAST SURGICAL HISTORY: Past Surgical  History  Procedure Laterality Date  . Esophageal dilation    . Thyroidectomy, partial    . Cataract extraction w/ intraocular lens implant      right  . Back surgery      fusion of L3&4  . Tonsillectomy    . Total abdominal hysterectomy w/ bilateral salpingoophorectomy Bilateral     staging biopsies  . Breast cyst aspiration N/A   . Breast biopsy Left 10/13/2014    path pending  . Cystoscopy w/ retrogrades Right 10/28/2014    Procedure: CYSTOSCOPY WITH RETROGRADE PYELOGRAM ATTEMPT, UNABLE TO PASS ;  Surgeon: [name], MD;  Location: [facility] ORS;  Service: Urology;  Laterality: Right;  . Cystoscopy with stent placement Right 10/28/2014    Procedure: BLADDER BIOPSY WITH FULGERATION ;  Surgeon: [name], MD;  Location: [facility] ORS;  Service: Urology;  Laterality: Right;  . Thyroid removed      FAMILY HISTORY Family History  Problem Relation Age of Onset  . Stroke Mother   . Breast cancer Maternal Aunt     x 2        ADVANCED DIRECTIVES:  Patient does not have any living will or healthcare power of attorney.  Information was given .  Available resources had been discussed.  We will follow-up on subsequent appointments regarding this issue  HEALTH MAINTENANCE: Social History  Substance Use Topics  . Smoking status: Never Smoker   . Smokeless tobacco: Never Used  . Alcohol Use: No      Allergies  Allergen Reactions  . Penicillins Rash  . Hydrocodone-Acetaminophen Nausea Only  . Novocain [Procaine] Other (See Comments) and Nausea And Vomiting    Chest pain  . Sulfa Antibiotics Other (See Comments)    CHEST PAIN    Current Outpatient Prescriptions  Medication Sig Dispense Refill  . calcium carbonate (OS-CAL) 1250 MG chewable tablet Chew 1 tablet by mouth daily.    . clopidogrel (PLAVIX) 75 MG tablet Take 1 tablet by mouth daily.    Marland Kitchen gabapentin (NEURONTIN) 300 MG capsule Take 1 capsule by mouth 2 (two) times daily.    . Multiple Vitamins-Minerals  (MULTIVITAMIN WITH MINERALS) tablet Take 1 tablet by mouth daily.    . OxyCODONE (OXYCONTIN) 10 mg T12A 12 hr tablet Take 1 tablet (10 mg total) by mouth every 12 (twelve) hours. 60 tablet 0  . oxyCODONE-acetaminophen (PERCOCET/ROXICET) 5-325 MG per tablet Take 1 tablet by mouth every 6 (six) hours as needed.    . simethicone (MYLICON) 80 MG chewable tablet Chew 80 mg by mouth daily.     No current facility-administered medications for this visit.    OBJECTIVE: PHYSICAL EXAM: GENERAL:  Well developed, well nourished, sitting comfortably in the exam room in no acute distress. MENTAL STATUS:  Alert and oriented to person, place and time.  ENT:  Oropharynx clear without lesion.  Tongue normal. Mucous membranes moist.  RESPIRATORY:  Clear to auscultation without rales, wheezes or rhonchi. CARDIOVASCULAR:  Regular rate and rhythm without murmur, rub or gallop. BREAST:  Right breast without masses, skin changes or nipple discharge.  Marland Kitchen  Left breast inner and upper quadrant patient had ecchymosis from previous biopsy.  No palpable masses so no palpable lymphadenopathy ABDOMEN:  Soft, non-tender, with active bowel sounds, and no hepatosplenomegaly.  No masses. BACK:   CVA tenderness.  No tenderness on percussion of the back or rib cage. SKIN:  No rashes, ulcers or lesions. EXTREMITIES: No edema, no skin discoloration or tenderness.  No palpable cords. LYMPH NODES: No palpable cervical, supraclavicular, axillary or inguinal adenopathy  NEUROLOGICAL: Unremarkable. PSYCH:  Appropriate.  Filed Vitals:   11/11/14 1249  BP: 128/69  Pulse: 64  Temp: 95.4 F (35.2 C)     Body mass index is 25.72 kg/(m^2).    ECOG FS:1 - Symptomatic but completely ambulatory  LAB RESULTS:  No recent lab at present time other than serum creatinine Biopsy report has been reviewed All lab data has been reviewed  STUDIES: Ct Chest W Contrast  11/09/2014   CLINICAL DATA:  Recurrent endometrial carcinoma. Newly  diagnosed left breast carcinoma. Right hydronephrosis.  EXAM: CT CHEST, ABDOMEN, AND PELVIS WITH CONTRAST  TECHNIQUE: Multidetector CT imaging of the chest, abdomen and pelvis was performed following the standard protocol during bolus administration of intravenous contrast.  CONTRAST:  40mL OMNIPAQUE IOHEXOL 300 MG/ML  SOLN  COMPARISON:  AP CT on 04/04/2012  FINDINGS: CT CHEST FINDINGS  Mediastinum/Lymph Nodes: No masses, pathologically enlarged lymph nodes, or other significant abnormality.  Lungs/Pleura: No pulmonary mass, infiltrate, or effusion. Biapical and right lower lobe scarring noted.  Musculoskeletal: No chest wall mass or suspicious bone lesions identified.  CT ABDOMEN PELVIS FINDINGS  Hepatobiliary: No masses or other significant abnormality.  Pancreas: No mass, inflammatory changes, or other significant abnormality.  Spleen: Within normal limits in size and appearance.  Adrenals/Urinary Tract: No masses identified. Diffuse right renal parenchymal atrophy is seen with severe right hydroureteronephrosis which is new since previous study.  Stomach/Bowel: No evidence of obstruction, inflammatory process, or abnormal fluid collections.  Vascular/Lymphatic: Bilateral iliac lymphadenopathy is seen within the pelvis, right side greater than left. Largest area measures 2.3 cm on image 94 of series 2. 1.5 cm soft tissue nodule or lymphadenopathy is also seen in the area the sigmoid mesocolon on image 93/series 2.  Reproductive: Prior hysterectomy noted. 1.5 cm soft tissue nodule is seen along the left vaginal cuff, suspicious for recurrent carcinoma.  Other: None.  Musculoskeletal: A lytic bone metastasis is seen involving the right ilium with associated soft tissue mass in the right iliopsoas region measuring 6.7 x 5.5 cm.  IMPRESSION: Bilateral pelvic lymphadenopathy, right side greater than left, consistent with metastatic disease.  Small soft tissue nodule involving the left vaginal cuff and 1.5 cm soft  tissue nodule or lymphadenopathy in the sigmoid mesocolon, consistent with carcinoma.  Right iliac bone metastasis with associated iliopsoas soft tissue mass.  Severe right hydronephrosis and diffuse right renal parenchymal atrophy secondary to pelvic metastatic disease.  No metastatic disease identified within the abdomen or chest.   Electronically Signed   By: Earle Gell M.D.   On: 11/09/2014 11:19   [place] Intraoperative  11/13/2014   CLINICAL DATA:    Ultrasound was provided for use by the ordering physician, and a technical  charge was applied by the performing facility.  No radiologist  interpretation/professional services rendered.    Ct Abdomen Pelvis W Contrast  11/09/2014   CLINICAL DATA:  Recurrent endometrial carcinoma. Newly diagnosed left breast carcinoma. Right hydronephrosis.  EXAM: CT CHEST, ABDOMEN, AND PELVIS WITH CONTRAST  TECHNIQUE: Multidetector CT  imaging of the chest, abdomen and pelvis was performed following the standard protocol during bolus administration of intravenous contrast.  CONTRAST:  67mL OMNIPAQUE IOHEXOL 300 MG/ML  SOLN  COMPARISON:  AP CT on 04/04/2012  FINDINGS: CT CHEST FINDINGS  Mediastinum/Lymph Nodes: No masses, pathologically enlarged lymph nodes, or other significant abnormality.  Lungs/Pleura: No pulmonary mass, infiltrate, or effusion. Biapical and right lower lobe scarring noted.  Musculoskeletal: No chest wall mass or suspicious bone lesions identified.  CT ABDOMEN PELVIS FINDINGS  Hepatobiliary: No masses or other significant abnormality.  Pancreas: No mass, inflammatory changes, or other significant abnormality.  Spleen: Within normal limits in size and appearance.  Adrenals/Urinary Tract: No masses identified. Diffuse right renal parenchymal atrophy is seen with severe right hydroureteronephrosis which is new since previous study.  Stomach/Bowel: No evidence of obstruction, inflammatory process, or abnormal fluid collections.  Vascular/Lymphatic: Bilateral  iliac lymphadenopathy is seen within the pelvis, right side greater than left. Largest area measures 2.3 cm on image 94 of series 2. 1.5 cm soft tissue nodule or lymphadenopathy is also seen in the area the sigmoid mesocolon on image 93/series 2.  Reproductive: Prior hysterectomy noted. 1.5 cm soft tissue nodule is seen along the left vaginal cuff, suspicious for recurrent carcinoma.  Other: None.  Musculoskeletal: A lytic bone metastasis is seen involving the right ilium with associated soft tissue mass in the right iliopsoas region measuring 6.7 x 5.5 cm.  IMPRESSION: Bilateral pelvic lymphadenopathy, right side greater than left, consistent with metastatic disease.  Small soft tissue nodule involving the left vaginal cuff and 1.5 cm soft tissue nodule or lymphadenopathy in the sigmoid mesocolon, consistent with carcinoma.  Right iliac bone metastasis with associated iliopsoas soft tissue mass.  Severe right hydronephrosis and diffuse right renal parenchymal atrophy secondary to pelvic metastatic disease.  No metastatic disease identified within the abdomen or chest.   Electronically Signed   By: Earle Gell M.D.   On: 11/09/2014 11:19   Ir Nephrostomy Placement Right  11/13/2014   CLINICAL DATA:  81 year old with metastatic endometrial cancer. Obstruction of the right ureter with severe right hydronephrosis.  EXAM: PLACEMENT OF PERCUTANEOUS RIGHT NEPHROSTOMY TUBE WITH ULTRASOUND AND FLUOROSCOPIC GUIDANCE  Physician: Stephan Minister. Henn, MD  FLUOROSCOPY TIME:  1 minutes and 6 seconds.  MEDICATIONS: Fentanyl.  Ciprofloxacin 400 mg  ANESTHESIA/SEDATION: A radiology nurse monitored the patient for the procedure.  PROCEDURE: The procedure was explained to the patient. The risks and benefits of the procedure were discussed and the patient's questions were addressed. Informed consent was obtained from the patient. The patient was placed prone on the interventional table. Ultrasound was used to localize the right kidney. The  right flank was prepped and draped in a sterile fashion. Maximal barrier sterile technique was utilized including caps, mask, sterile gowns, sterile gloves, sterile drape, hand hygiene and skin antiseptic. Skin and soft tissues were anesthetized with 1% lidocaine. A 22 gauge Chiba needle was directed into a dilated calyx with ultrasound guidance. Contrast injection confirmed placement in the collecting system. A 0.018 wire was advanced into the dilated renal pelvis. An Accustick dilator set was placed. The tract was dilated to accommodate a 10 [place] multipurpose drain. Approximately 150 mL of amber colored urine was removed. Catheter was sutured to skin and attached to gravity bag. Bandage placed at the catheter site.  FINDINGS: Severe right hydronephrosis. Needle directed into a dilated calyx. 150 mL of fluid was removed. Renal collecting system was decompressed at the end of the procedure.  Estimated blood loss: Minimal  COMPLICATIONS: None  IMPRESSION: Successful right percutaneous nephrostomy tube placement with ultrasound and fluoroscopic guidance.  PLAN: Patient is scheduled to return in 1 week for attempted placement of antegrade ureter stent.   Electronically Signed   By: Markus Daft M.D.   On: 11/13/2014 12:57    ASSESSMENT:  Carcinoma of left breast at 9:00 position clinically stage is T1b N0 M0 tumor estrogen receptor positive progesterone receptor positive HER-2/neu receptor pending by fish PLAN:  \\  2.  Recurrent endometrial cancer CT scan has been reviewed shows hydronephrosis.  A big mass which is eroding into pelvic bone causing significant pain There are bilateral enlarged lymph node Estrogen and progesterone receptors are pending I had prolonged discussion with the family about nature of disease patient can have radiation therapy for palliation of pain however possibility of first chemotherapy with carboplatinum and Taxol can be considered. The pain is not relieved then radiation  therapy can be done We will also make arrangements for GYN oncological evaluation 3.  Discussed the nature of treatment which isIntent of chemotherapy is palliation and relief in symptoms and extending survival Side effects of chemotherapy had been discussed Patient would get down port placement Chemotherapy class 4.  Regarding pain we will increase his OxyContin to 20 mg twice a day with a breakthrough oxycodone 5.  Management of constipation 6.  I also discussed situation with Dr. Rochel Brome about postponing her breast cancer surgery CT scan has been reviewed independently and reviewed with the patient and family   Cancer of left breast   Staging form: Breast, AJCC 7th Edition     Clinical: Stage IA (T1b, N0, M0) - Signed by Forest Gleason, MD on 10/20/2014   Forest Gleason, MD   11/14/2014 8:56 AM    [place] @ [place] [phone]  Fax:(336) [place] OB: [birth year]  MR#: [id number]  GGE#:[id number]  Patient Care Team: Idelle Crouch, MD as PCP - General (Unknown Physician Specialty) [name], MD as Referring Physician (Surgery) [name], MD as Consulting Physician (Urology)  CHIEF COMPLAINT:  Chief Complaint  Patient presents with  . OTHER  1/  VISIT DIAGNOSIS:   No diagnosis found.  Oncology History   1.  Carcinoma of left breast  IMPRESSION: Ultrasound-guided biopsy of a suspicious left breast mass at 9 o'clock. No apparent complications.   2.  Estrogen receptor positive.  Progesterone receptor positive.  HER-2 receptor 2+ by  IHC . Fish is pending  5 mm tumor clinically stage IB N0 M0 tumor   3.  Patient has a previous history of endometrium  carcinoma of endometrium stage IB disease status post bilateral salpingo-oophorectomy and radiation therapy 4.  Right hydronephrosis detected on MRI scan off lumbosacral spine.  Cystoscopy with attempted stent placement revealed bladder tumor in  September of 2016 biopsies positive for recurrent endometrial cancer  Oncology Flowsheet 04/08/2011 10/28/2014  dexamethasone (DECADRON) IJ - -  enoxaparin (LOVENOX) [place] 40 mg -  ondansetron (ZOFRAN) IV - -    INTERVAL HISTORY:  81 year old lady was getting regular mammogram done.  Patient had abnormal mammogram initial biopsies showed 5 mm tumor at 9:00 position in the left breast.  Biopsy of which is positive for invasive carcinoma patient was referred to me for preop treatment planning.  Patient had a previous history of carcinoma of endometrium.  Patient is not on any anti-hormonal therapy.  And does not have any  significant family history of breast cancer.  September 2 016 Patient has significant right-sided pain.  Patient underwent MRI scan of lumbosacral spine right hydronephrosis was found.  Patient was evaluated by a urologist and cystoscopy revealed bladder tumor which was blocking right ureter.  Attempted stent placement was unsuccessful.  Percutaneous stent placement has been planned.  Patient had biopsy which was consistent with recurrent carcinoma of uterus.  Patient is here for further evaluation and treatment consideration. Patient also had a lumpectomy scheduled for carcinoma of breast stage IB disease Pain is still continues to bother patient  REVIEW OF SYSTEMS:   GENERAL:  Feels good.  Active.  No fevers, sweats or weight loss. PERFORMANCE STATUS (ECOG):  01 HEENT:  No visual changes, runny nose, sore throat, mouth sores or tenderness. Lungs: No shortness of breath or cough.  No hemoptysis. Cardiac:  No chest pain, palpitations, orthopnea, or PND. Right flank pain.  Right low back pain.  Right pain radiating down her right lower extremity.  Recent cystoscopy is consistent with bladder tumor.  Biopsies positive for endometrial cancer  Musculoskeletal:  No back pain.  No joint pain.  No muscle tenderness. Extremities:  No pain or swelling. Skin:  No rashes or skin  changes. Neuro:  No headache, numbness or weakness, balance or coordination issues. Endocrine:  No diabetes, thyroid issues, hot flashes or night sweats. Psych:  No mood changes, depression or anxiety. Pain:  No focal pain. Review of systems:  All other systems reviewed and found to be negative.  As per HPI. Otherwise, a complete review of systems is negatve.  PAST MEDICAL HISTORY: Past Medical History  Diagnosis Date  . History of esophageal stricture   . GERD (gastroesophageal reflux disease)   . Arthritis   . Endometrial adenocarcinoma ([place]) 01/2012     stage Ib, grade 1, positive peritoneal cytology, no other risk factors or extra-uterine disease  . Inguinal lymphocyst   . History of brachytherapy   . History of shingles   . History of colon polyps   . Mild aphasia   . Hearing loss   . History of DVT (deep vein thrombosis)   . Cancer of left breast ([place]) 10/20/2014  . Stroke ([place]) 04/06/2011    residual:  aphasia  . Hiccups   . Pain     SEVERE CHRONIC RIGHT LEG,HIP,FOOT  . Chronic kidney disease     HYDRONEPHROSIS    PAST SURGICAL HISTORY: Past Surgical History  Procedure Laterality Date  . Esophageal dilation    . Thyroidectomy, partial    . Cataract extraction w/ intraocular lens implant      right  . Back surgery      fusion of L3&4  . Tonsillectomy    . Total abdominal hysterectomy w/ bilateral salpingoophorectomy Bilateral     staging biopsies  . Breast cyst aspiration N/A   . Breast biopsy Left 10/13/2014    path pending  . Cystoscopy w/ retrogrades Right 10/28/2014    Procedure: CYSTOSCOPY WITH RETROGRADE PYELOGRAM ATTEMPT, UNABLE TO PASS ;  Surgeon: [name], MD;  Location: [facility] ORS;  Service: Urology;  Laterality: Right;  . Cystoscopy with stent placement Right 10/28/2014    Procedure: BLADDER BIOPSY WITH FULGERATION ;  Surgeon: [name], MD;  Location: [facility] ORS;  Service: Urology;  Laterality: Right;  . Thyroid removed      FAMILY  HISTORY Family History  Problem Relation Age of Onset  . Stroke Mother   . Breast cancer  Maternal Aunt     x 2        ADVANCED DIRECTIVES:  Patient does not have any living will or healthcare power of attorney.  Information was given .  Available resources had been discussed.  We will follow-up on subsequent appointments regarding this issue  HEALTH MAINTENANCE: Social History  Substance Use Topics  . Smoking status: Never Smoker   . Smokeless tobacco: Never Used  . Alcohol Use: No      Allergies  Allergen Reactions  . Penicillins Rash  . Hydrocodone-Acetaminophen Nausea Only  . Novocain [Procaine] Other (See Comments) and Nausea And Vomiting    Chest pain  . Sulfa Antibiotics Other (See Comments)    CHEST PAIN    Current Outpatient Prescriptions  Medication Sig Dispense Refill  . calcium carbonate (OS-CAL) 1250 MG chewable tablet Chew 1 tablet by mouth daily.    . clopidogrel (PLAVIX) 75 MG tablet Take 1 tablet by mouth daily.    Marland Kitchen gabapentin (NEURONTIN) 300 MG capsule Take 1 capsule by mouth 2 (two) times daily.    . Multiple Vitamins-Minerals (MULTIVITAMIN WITH MINERALS) tablet Take 1 tablet by mouth daily.    . OxyCODONE (OXYCONTIN) 10 mg T12A 12 hr tablet Take 1 tablet (10 mg total) by mouth every 12 (twelve) hours. 60 tablet 0  . oxyCODONE-acetaminophen (PERCOCET/ROXICET) 5-325 MG per tablet Take 1 tablet by mouth every 6 (six) hours as needed.    . simethicone (MYLICON) 80 MG chewable tablet Chew 80 mg by mouth daily.     No current facility-administered medications for this visit.    OBJECTIVE: PHYSICAL EXAM: GENERAL:  Well developed, well nourished, sitting comfortably in the exam room in no acute distress. MENTAL STATUS:  Alert and oriented to person, place and time.  ENT:  Oropharynx clear without lesion.  Tongue normal. Mucous membranes moist.  RESPIRATORY:  Clear to auscultation without rales, wheezes or rhonchi. CARDIOVASCULAR:  Regular rate and  rhythm without murmur, rub or gallop. BREAST:  Right breast without masses, skin changes or nipple discharge.  .  Left breast inner and upper quadrant patient had ecchymosis from previous biopsy.  No palpable masses so no palpable lymphadenopathy ABDOMEN:  Soft, non-tender, with active bowel sounds, and no hepatosplenomegaly.  No masses. BACK:   CVA tenderness.  No tenderness on percussion of the back or rib cage. SKIN:  No rashes, ulcers or lesions. EXTREMITIES: No edema, no skin discoloration or tenderness.  No palpable cords. LYMPH NODES: No palpable cervical, supraclavicular, axillary or inguinal adenopathy  NEUROLOGICAL: Unremarkable. PSYCH:  Appropriate.  Filed Vitals:   11/11/14 1249  BP: 128/69  Pulse: 64  Temp: 95.4 F (35.2 C)     Body mass index is 25.72 kg/(m^2).    ECOG FS:1 - Symptomatic but completely ambulatory  LAB RESULTS:  No recent lab at present time other than serum creatinine Biopsy report has been reviewed All lab data has been reviewed  STUDIES: Ct Chest W Contrast  11/09/2014   CLINICAL DATA:  Recurrent endometrial carcinoma. Newly diagnosed left breast carcinoma. Right hydronephrosis.  EXAM: CT CHEST, ABDOMEN, AND PELVIS WITH CONTRAST  TECHNIQUE: Multidetector CT imaging of the chest, abdomen and pelvis was performed following the standard protocol during bolus administration of intravenous contrast.  CONTRAST:  92mL OMNIPAQUE IOHEXOL 300 MG/ML  SOLN  COMPARISON:  AP CT on 04/04/2012  FINDINGS: CT CHEST FINDINGS  Mediastinum/Lymph Nodes: No masses, pathologically enlarged lymph nodes, or other significant abnormality.  Lungs/Pleura: No pulmonary mass, infiltrate,  or effusion. Biapical and right lower lobe scarring noted.  Musculoskeletal: No chest wall mass or suspicious bone lesions identified.  CT ABDOMEN PELVIS FINDINGS  Hepatobiliary: No masses or other significant abnormality.  Pancreas: No mass, inflammatory changes, or other significant abnormality.  Spleen:  Within normal limits in size and appearance.  Adrenals/Urinary Tract: No masses identified. Diffuse right renal parenchymal atrophy is seen with severe right hydroureteronephrosis which is new since previous study.  Stomach/Bowel: No evidence of obstruction, inflammatory process, or abnormal fluid collections.  Vascular/Lymphatic: Bilateral iliac lymphadenopathy is seen within the pelvis, right side greater than left. Largest area measures 2.3 cm on image 94 of series 2. 1.5 cm soft tissue nodule or lymphadenopathy is also seen in the area the sigmoid mesocolon on image 93/series 2.  Reproductive: Prior hysterectomy noted. 1.5 cm soft tissue nodule is seen along the left vaginal cuff, suspicious for recurrent carcinoma.  Other: None.  Musculoskeletal: A lytic bone metastasis is seen involving the right ilium with associated soft tissue mass in the right iliopsoas region measuring 6.7 x 5.5 cm.  IMPRESSION: Bilateral pelvic lymphadenopathy, right side greater than left, consistent with metastatic disease.  Small soft tissue nodule involving the left vaginal cuff and 1.5 cm soft tissue nodule or lymphadenopathy in the sigmoid mesocolon, consistent with carcinoma.  Right iliac bone metastasis with associated iliopsoas soft tissue mass.  Severe right hydronephrosis and diffuse right renal parenchymal atrophy secondary to pelvic metastatic disease.  No metastatic disease identified within the abdomen or chest.   Electronically Signed   By: Earle Gell M.D.   On: 11/09/2014 11:19   [place] Intraoperative  11/13/2014   CLINICAL DATA:    Ultrasound was provided for use by the ordering physician, and a technical  charge was applied by the performing facility.  No radiologist  interpretation/professional services rendered.    Ct Abdomen Pelvis W Contrast  11/09/2014   CLINICAL DATA:  Recurrent endometrial carcinoma. Newly diagnosed left breast carcinoma. Right hydronephrosis.  EXAM: CT CHEST, ABDOMEN, AND PELVIS WITH  CONTRAST  TECHNIQUE: Multidetector CT imaging of the chest, abdomen and pelvis was performed following the standard protocol during bolus administration of intravenous contrast.  CONTRAST:  16mL OMNIPAQUE IOHEXOL 300 MG/ML  SOLN  COMPARISON:  AP CT on 04/04/2012  FINDINGS: CT CHEST FINDINGS  Mediastinum/Lymph Nodes: No masses, pathologically enlarged lymph nodes, or other significant abnormality.  Lungs/Pleura: No pulmonary mass, infiltrate, or effusion. Biapical and right lower lobe scarring noted.  Musculoskeletal: No chest wall mass or suspicious bone lesions identified.  CT ABDOMEN PELVIS FINDINGS  Hepatobiliary: No masses or other significant abnormality.  Pancreas: No mass, inflammatory changes, or other significant abnormality.  Spleen: Within normal limits in size and appearance.  Adrenals/Urinary Tract: No masses identified. Diffuse right renal parenchymal atrophy is seen with severe right hydroureteronephrosis which is new since previous study.  Stomach/Bowel: No evidence of obstruction, inflammatory process, or abnormal fluid collections.  Vascular/Lymphatic: Bilateral iliac lymphadenopathy is seen within the pelvis, right side greater than left. Largest area measures 2.3 cm on image 94 of series 2. 1.5 cm soft tissue nodule or lymphadenopathy is also seen in the area the sigmoid mesocolon on image 93/series 2.  Reproductive: Prior hysterectomy noted. 1.5 cm soft tissue nodule is seen along the left vaginal cuff, suspicious for recurrent carcinoma.  Other: None.  Musculoskeletal: A lytic bone metastasis is seen involving the right ilium with associated soft tissue mass in the right iliopsoas region measuring 6.7 x 5.5 cm.  IMPRESSION: Bilateral pelvic lymphadenopathy, right side greater than left, consistent with metastatic disease.  Small soft tissue nodule involving the left vaginal cuff and 1.5 cm soft tissue nodule or lymphadenopathy in the sigmoid mesocolon, consistent with carcinoma.  Right iliac  bone metastasis with associated iliopsoas soft tissue mass.  Severe right hydronephrosis and diffuse right renal parenchymal atrophy secondary to pelvic metastatic disease.  No metastatic disease identified within the abdomen or chest.   Electronically Signed   By: Earle Gell M.D.   On: 11/09/2014 11:19   Ir Nephrostomy Placement Right  11/13/2014   CLINICAL DATA:  81 year old with metastatic endometrial cancer. Obstruction of the right ureter with severe right hydronephrosis.  EXAM: PLACEMENT OF PERCUTANEOUS RIGHT NEPHROSTOMY TUBE WITH ULTRASOUND AND FLUOROSCOPIC GUIDANCE  Physician: Stephan Minister. Henn, MD  FLUOROSCOPY TIME:  1 minutes and 6 seconds.  MEDICATIONS: Fentanyl.  Ciprofloxacin 400 mg  ANESTHESIA/SEDATION: A radiology nurse monitored the patient for the procedure.  PROCEDURE: The procedure was explained to the patient. The risks and benefits of the procedure were discussed and the patient's questions were addressed. Informed consent was obtained from the patient. The patient was placed prone on the interventional table. Ultrasound was used to localize the right kidney. The right flank was prepped and draped in a sterile fashion. Maximal barrier sterile technique was utilized including caps, mask, sterile gowns, sterile gloves, sterile drape, hand hygiene and skin antiseptic. Skin and soft tissues were anesthetized with 1% lidocaine. A 22 gauge Chiba needle was directed into a dilated calyx with ultrasound guidance. Contrast injection confirmed placement in the collecting system. A 0.018 wire was advanced into the dilated renal pelvis. An Accustick dilator set was placed. The tract was dilated to accommodate a 10 [place] multipurpose drain. Approximately 150 mL of amber colored urine was removed. Catheter was sutured to skin and attached to gravity bag. Bandage placed at the catheter site.  FINDINGS: Severe right hydronephrosis. Needle directed into a dilated calyx. 150 mL of fluid was removed. Renal  collecting system was decompressed at the end of the procedure.  Estimated blood loss: Minimal  COMPLICATIONS: None  IMPRESSION: Successful right percutaneous nephrostomy tube placement with ultrasound and fluoroscopic guidance.  PLAN: Patient is scheduled to return in 1 week for attempted placement of antegrade ureter stent.   Electronically Signed   By: Markus Daft M.D.   On: 11/13/2014 12:57    ASSESSMENT:  Carcinoma of left breast at 9:00 position clinically stage is T1b N0 M0 tumor estrogen receptor positive progesterone receptor positive HER-2/neu receptor pending by fish PLAN:  \\  Recent MRI has been reviewed as right hydronephrosis Cystoscopy report has been reviewed shows bladder tumor which is blocking the right ureter and unsuccessful attempt of stent placement Biopsies positive for recurrent endometrial cancer I informed this finding with the patient to the patient and family is very difficult for patient to understand why uterine cancer is recurring at present time. When  was all removed in the past I had prolonged discussion regarding the nature of disease. At this point in time regarding pain management and I suggested Use of OxyContin 10 mg every 12 hourly followed by breakthrough pain medication with oxycodone 2.  Complete restaging with CT scan of chest abdomen and pelvis 3.  Proceed with percutaneous nephrostomy tube placement to see whether that would help relieve some of the pain Total duration of visit was 45 minutes.  50% or more time was spent in counseling patient and family regarding prognosis and  options of treatment and available resources  Discussion continues with urology somewhat percutaneous nephrostomy tube placement Bladder tissue would be sent for estrogen receptor assay We will also make an appointment for GYN oncology for evaluation We will also review the record of previous radiation therapy   Patient expressed understanding and was in agreement with this  plan. She also understands that She can call clinic at any time with any questions, concerns, or complaints.    Cancer of left breast   Staging form: Breast, AJCC 7th Edition     Clinical: Stage IA (T1b, N0, M0) - Signed by Forest Gleason, MD on 10/20/2014   Forest Gleason, MD   11/14/2014 8:56 AM

## 2014-11-17 ENCOUNTER — Inpatient Hospital Stay: Payer: Medicare Other

## 2014-11-17 ENCOUNTER — Encounter
Admission: RE | Admit: 2014-11-17 | Discharge: 2014-11-17 | Disposition: A | Discharge status: Home / Self Care | Payer: Medicare Other | Source: Ambulatory Visit | Attending: Surgery | Admitting: Surgery

## 2014-11-17 DIAGNOSIS — C7951 Secondary malignant neoplasm of bone: Secondary | ICD-10-CM | POA: Diagnosis not present

## 2014-11-17 DIAGNOSIS — M1990 Unspecified osteoarthritis, unspecified site: Secondary | ICD-10-CM | POA: Diagnosis not present

## 2014-11-17 DIAGNOSIS — Z452 Encounter for adjustment and management of vascular access device: Secondary | ICD-10-CM | POA: Diagnosis not present

## 2014-11-17 DIAGNOSIS — C50912 Malignant neoplasm of unspecified site of left female breast: Secondary | ICD-10-CM | POA: Diagnosis not present

## 2014-11-17 DIAGNOSIS — C541 Malignant neoplasm of endometrium: Secondary | ICD-10-CM | POA: Diagnosis not present

## 2014-11-17 DIAGNOSIS — M79604 Pain in right leg: Secondary | ICD-10-CM | POA: Diagnosis not present

## 2014-11-17 DIAGNOSIS — K219 Gastro-esophageal reflux disease without esophagitis: Secondary | ICD-10-CM | POA: Diagnosis not present

## 2014-11-17 DIAGNOSIS — C7911 Secondary malignant neoplasm of bladder: Secondary | ICD-10-CM | POA: Diagnosis not present

## 2014-11-17 DIAGNOSIS — Z86718 Personal history of other venous thrombosis and embolism: Secondary | ICD-10-CM | POA: Diagnosis not present

## 2014-11-17 DIAGNOSIS — N131 Hydronephrosis with ureteral stricture, not elsewhere classified: Secondary | ICD-10-CM | POA: Diagnosis not present

## 2014-11-17 DIAGNOSIS — Z823 Family history of stroke: Secondary | ICD-10-CM | POA: Diagnosis not present

## 2014-11-17 DIAGNOSIS — R590 Localized enlarged lymph nodes: Secondary | ICD-10-CM | POA: Diagnosis not present

## 2014-11-17 DIAGNOSIS — Z90710 Acquired absence of both cervix and uterus: Secondary | ICD-10-CM | POA: Diagnosis not present

## 2014-11-17 DIAGNOSIS — N189 Chronic kidney disease, unspecified: Secondary | ICD-10-CM | POA: Diagnosis not present

## 2014-11-17 DIAGNOSIS — Z86010 Personal history of colonic polyps: Secondary | ICD-10-CM | POA: Diagnosis not present

## 2014-11-17 DIAGNOSIS — Z803 Family history of malignant neoplasm of breast: Secondary | ICD-10-CM | POA: Diagnosis not present

## 2014-11-17 DIAGNOSIS — Z8673 Personal history of transient ischemic attack (TIA), and cerebral infarction without residual deficits: Secondary | ICD-10-CM | POA: Diagnosis not present

## 2014-11-17 DIAGNOSIS — Z8542 Personal history of malignant neoplasm of other parts of uterus: Secondary | ICD-10-CM | POA: Diagnosis not present

## 2014-11-17 HISTORY — DX: Malignant neoplasm of bone and articular cartilage, unspecified: C41.9

## 2014-11-17 HISTORY — DX: Gastric ulcer, unspecified as acute or chronic, without hemorrhage or perforation: K25.9

## 2014-11-17 LAB — BASIC METABOLIC PANEL
ANION GAP: 7 (ref 5–15)
BUN: 23 mg/dL — ABNORMAL HIGH (ref 6–20)
CALCIUM: 9.4 mg/dL (ref 8.9–10.3)
CHLORIDE: 104 mmol/L (ref 101–111)
CO2: 29 mmol/L (ref 22–32)
Creatinine, Ser: 1.13 mg/dL — ABNORMAL HIGH (ref 0.44–1.00)
GFR calc Af Amer: 51 mL/min — ABNORMAL LOW (ref >60)
GFR calc non Af Amer: 44 mL/min — ABNORMAL LOW (ref >60)
GLUCOSE: 100 mg/dL — AB (ref 65–99)
Potassium: 4.1 mmol/L (ref 3.5–5.1)
Sodium: 140 mmol/L (ref 135–145)

## 2014-11-17 NOTE — Patient Instructions (Signed)
  Your procedure is scheduled [id number] 20, 2016(Thursday) Report to Day Surgery.[facility]) To find out your arrival time please call [phone] between 1PM - 3PM on November 25, 2014 (Wednesday).  Remember: Instructions that are not followed completely may result in serious medical risk, up to and including death, or upon the discretion of your surgeon and anesthesiologist your surgery may need to be rescheduled.    _x___ 1. Do not eat food or drink liquids after midnight. No gum chewing or hard candies.     ____ 2. No Alcohol for 24 hours before or after surgery.   ____ 3. Bring all medications with you on the day of surgery if instructed.    __x__ 4. Notify your doctor if there is any change in your medical condition     (cold, fever, infections).     Do not wear jewelry, make-up, hairpins, clips or nail polish.  Do not wear lotions, powders, or perfumes. You may wear deodorant.  Do not shave 48 hours prior to surgery. Men may shave face and neck.  Do not bring valuables to the hospital.    [facility] is not responsible for any belongings or valuables.               Contacts, dentures or bridgework may not be worn into surgery.  Leave your suitcase in the car. After surgery it may be brought to your room.  For patients admitted to the hospital, discharge time is determined by your                treatment team.   Patients discharged the day of surgery will not be allowed to drive home.   Please read over the following fact sheets that you were given:   Surgical Site Infection Prevention   ____ Take these medicines the morning of surgery with A SIP OF WATER:    1. Gabapentin  2.   3.   4.  5.  6.  ____ Fleet Enema (as directed)   __x__ Use CHG Soap as directed(SAGE WIPES)  ____ Use inhalers on the day of surgery  ____ Stop metformin 2 days prior to surgery    ____ Take 1/2 of usual insulin dose the night before surgery and none on the morning of surgery.    __x__ Stop Coumadin/Plavix/aspirin on (STOP PLAVIX NOW)  ____ Stop Anti-inflammatories on    ____ Stop supplements until after surgery.    ____ Bring C-Pap to the hospital.

## 2014-11-18 ENCOUNTER — Encounter: Payer: Self-pay | Admitting: Radiation Oncology

## 2014-11-18 ENCOUNTER — Inpatient Hospital Stay (HOSPITAL_BASED_OUTPATIENT_CLINIC_OR_DEPARTMENT_OTHER): Payer: Medicare Other | Admitting: Obstetrics and Gynecology

## 2014-11-18 ENCOUNTER — Ambulatory Visit: Payer: Medicare Other | Admitting: Obstetrics and Gynecology

## 2014-11-18 ENCOUNTER — Telehealth: Payer: Self-pay | Admitting: *Deleted

## 2014-11-18 ENCOUNTER — Ambulatory Visit
Admission: RE | Admit: 2014-11-18 | Discharge: 2014-11-18 | Disposition: A | Discharge status: Home / Self Care | Payer: Medicare Other | Source: Ambulatory Visit | Attending: Radiation Oncology | Admitting: Radiation Oncology

## 2014-11-18 VITALS — BP 121/66 | HR 73 | Temp 98.1°F | Wt 125.4 lb

## 2014-11-18 DIAGNOSIS — Z8542 Personal history of malignant neoplasm of other parts of uterus: Secondary | ICD-10-CM

## 2014-11-18 DIAGNOSIS — C7911 Secondary malignant neoplasm of bladder: Secondary | ICD-10-CM | POA: Diagnosis not present

## 2014-11-18 DIAGNOSIS — N1330 Unspecified hydronephrosis: Secondary | ICD-10-CM

## 2014-11-18 DIAGNOSIS — M79604 Pain in right leg: Secondary | ICD-10-CM

## 2014-11-18 DIAGNOSIS — G893 Neoplasm related pain (acute) (chronic): Secondary | ICD-10-CM

## 2014-11-18 DIAGNOSIS — C7989 Secondary malignant neoplasm of other specified sites: Secondary | ICD-10-CM | POA: Insufficient documentation

## 2014-11-18 DIAGNOSIS — C50912 Malignant neoplasm of unspecified site of left female breast: Secondary | ICD-10-CM | POA: Insufficient documentation

## 2014-11-18 DIAGNOSIS — C7951 Secondary malignant neoplasm of bone: Secondary | ICD-10-CM | POA: Diagnosis not present

## 2014-11-18 DIAGNOSIS — C541 Malignant neoplasm of endometrium: Secondary | ICD-10-CM

## 2014-11-18 DIAGNOSIS — C50812 Malignant neoplasm of overlapping sites of left female breast: Secondary | ICD-10-CM

## 2014-11-18 DIAGNOSIS — Z170 Estrogen receptor positive status [ER+]: Secondary | ICD-10-CM | POA: Insufficient documentation

## 2014-11-18 DIAGNOSIS — Z923 Personal history of irradiation: Secondary | ICD-10-CM

## 2014-11-18 DIAGNOSIS — Z90710 Acquired absence of both cervix and uterus: Secondary | ICD-10-CM

## 2014-11-18 DIAGNOSIS — Z510 Encounter for antineoplastic radiation therapy: Secondary | ICD-10-CM | POA: Insufficient documentation

## 2014-11-18 DIAGNOSIS — K5900 Constipation, unspecified: Secondary | ICD-10-CM

## 2014-11-18 DIAGNOSIS — C775 Secondary and unspecified malignant neoplasm of intrapelvic lymph nodes: Secondary | ICD-10-CM | POA: Insufficient documentation

## 2014-11-18 DIAGNOSIS — Z90722 Acquired absence of ovaries, bilateral: Secondary | ICD-10-CM

## 2014-11-18 NOTE — Progress Notes (Signed)
Assisted MD with pelvic exam

## 2014-11-18 NOTE — Progress Notes (Signed)
Gynecologic Oncology Visit   Referring Provider:  Dr. Oliva Bustard  Chief Concern: recurrent endometroid endometrial adenocarcinoma, stage Ib, grade 1.  Subjective:  Traci [name] is a 81 y.o. female who is seen in consultation from Dr. Oliva Bustard for recurrent endometroid endometrial adenocarcinoma, stage Ib, grade 1  Recently, patient had abnormal mammogram of the left breast and biopsy was positive for invasive carcinoma ER+/PR+. Staging work up showed right hydronephrosis and right pelvic mass invading bone and muscle.  See below.  10/28/14 Cystoscopy done for stent placement and showed tumor invading bladder.  BIOPSY: WELL-DIFFERENTIATED ADENOCARCINOMA MORPHOLOGICALLY SIMILAR TO PREVIOUS ENDOMETRIAL ADENOCARCINOMA.  The stent could not be placed so 11/13/14 Right PCN placed in radiology.   She has right leg pain, but no swelling.  Is taking oral narcotics, both long and short acting prescribed by Dr Oliva Bustard. Has some constipation and is taking stool softener.  CT scan 11/09/14 Bilateral pelvic lymphadenopathy, right side greater than left, consistent with metastatic disease. Small soft tissue nodule involving the left vaginal cuff and 1.5 cm soft tissue nodule or lymphadenopathy in the sigmoid mesocolon, consistent with carcinoma. Right iliac bone metastasis with associated iliopsoas soft tissue mass. Severe right hydronephrosis and diffuse right renal parenchymal atrophy secondary to pelvic metastatic disease. No metastatic disease identified within the abdomen or chest.  Gynecologic Oncology History 01/2012  endometroid endometrial adenocarcinoma, stage Ib, grade 1, positive peritoneal cytology, no other risk factors or extra-uterine disease, TAH/BSO and staging, lymphocyst post-op, vaginal brachytherapy   Health maintenance: Her breast cancer screening is done by her PCP, Dr. Doy Hutching, and she is scheduled for a mammogram today.   Problem List: Patient Active Problem List   Diagnosis Date Noted   . Endometrial cancer ([place]) 11/18/2014  . Degeneration of intervertebral disc of lumbosacral region 11/14/2014  . Overflow incontinence 11/14/2014  . Pure hypercholesterolemia 11/14/2014  . Acquired hypothyroidism 10/20/2014  . DDD (degenerative disc disease), lumbosacral 10/20/2014  . Benign neoplasm of colon 10/20/2014  . Acid reflux 10/20/2014  . Esophageal stenosis 10/20/2014  . Bloodgood disease 10/20/2014  . History of colon polyps 10/20/2014  . H/O gastric ulcer 10/20/2014  . Healed or old pulmonary embolism 10/20/2014  . History of urinary anomaly 10/20/2014  . BP (high blood pressure) 10/20/2014  . Hypersomnia with sleep apnea 10/20/2014  . Multinodular goiter 10/20/2014  . Angina pectoris ([place]) 10/20/2014  . Seizure ([place]) 10/20/2014  . Digestive symptom 10/20/2014  . Incontinence overflow, urine 10/20/2014  . Dupuytren's contracture of foot 10/20/2014  . Hypercholesterolemia without hypertriglyceridemia 10/20/2014  . Cancer of left breast ([place]) 10/20/2014  . History of endometrial cancer 09/30/2014  . Lumbar radiculopathy 09/04/2014  . Deep vein thrombosis ([place]) 07/15/2013  . Deep vein thrombosis (DVT) ([place]) 07/15/2013  . Stroke [facility]) 04/07/2011    Past Medical History: Past Medical History  Diagnosis Date  . History of esophageal stricture   . GERD (gastroesophageal reflux disease)   . Inguinal lymphocyst   . History of brachytherapy   . History of shingles   . History of colon polyps   . Mild aphasia   . Hearing loss   . History of DVT (deep vein thrombosis)   . Stroke ([place]) 04/06/2011    residual:  aphasia  . Hiccups   . Pain     SEVERE CHRONIC RIGHT LEG,HIP,FOOT  . Chronic kidney disease     HYDRONEPHROSIS  . Endometrial adenocarcinoma ([place]) 01/2012     stage Ib, grade 1, positive peritoneal cytology, no other risk  factors or extra-uterine disease  . Cancer of left breast ([place]) 10/20/2014  . Bone cancer ([place])   . Arthritis     Degenerative Disc  Disease  . Gastric ulcer     Past Surgical History: Past Surgical History  Procedure Laterality Date  . Esophageal dilation    . Thyroidectomy, partial    . Cataract extraction w/ intraocular lens implant      right  . Back surgery      fusion of L3&4  . Tonsillectomy    . Total abdominal hysterectomy w/ bilateral salpingoophorectomy Bilateral     staging biopsies  . Breast cyst aspiration N/A   . Breast biopsy Left 10/13/2014    path pending  . Cystoscopy w/ retrogrades Right 10/28/2014    Procedure: CYSTOSCOPY WITH RETROGRADE PYELOGRAM ATTEMPT, UNABLE TO PASS ;  Surgeon: [name], MD;  Location: [facility] ORS;  Service: Urology;  Laterality: Right;  . Cystoscopy with stent placement Right 10/28/2014    Procedure: BLADDER BIOPSY WITH FULGERATION ;  Surgeon: [name], MD;  Location: [facility] ORS;  Service: Urology;  Laterality: Right;  . Thyroid removed    . Eye surgery Bilateral     Cataract Extraction  . Abdominal hysterectomy      Past Gynecologic History:  See HPI  OB History:  OB History  Gravida Para Term Preterm AB SAB TAB Ectopic Multiple Living  2             # Outcome Date GA Lbr Len/2nd Weight Sex Delivery Anes PTL Lv  2 Gravida           1 Gravida               Family History: Family History  Problem Relation Age of Onset  . Stroke Mother   . Breast cancer Maternal Aunt     x 2    Social History: Social History   Social History  . Marital Status: Married    Spouse Name: N/A  . Number of Children: N/A  . Years of Education: N/A   Occupational History  . Not on file.   Social History Main Topics  . Smoking status: Never Smoker   . Smokeless tobacco: Never Used  . Alcohol Use: No  . Drug Use: No  . Sexual Activity:    Partners: Male   Other Topics Concern  . Not on file   Social History Narrative    Allergies: Allergies  Allergen Reactions  . Penicillins Rash  . Hydrocodone-Acetaminophen Nausea Only  . Novocain  [Procaine] Other (See Comments) and Nausea And Vomiting    Chest pain  . Sulfa Antibiotics Other (See Comments)    CHEST PAIN    Current Medications: Current Outpatient Prescriptions  Medication Sig Dispense Refill  . calcium carbonate (OS-CAL) 1250 MG chewable tablet Chew 1 tablet by mouth daily.    . clopidogrel (PLAVIX) 75 MG tablet Take 1 tablet by mouth daily.    Marland Kitchen gabapentin (NEURONTIN) 300 MG capsule Take 1 capsule by mouth 2 (two) times daily.    . Multiple Vitamins-Minerals (MULTIVITAMIN WITH MINERALS) tablet Take 1 tablet by mouth daily.    . OxyCODONE (OXYCONTIN) 10 mg T12A 12 hr tablet Take 1 tablet (10 mg total) by mouth every 12 (twelve) hours. 60 tablet 0  . oxyCODONE-acetaminophen (PERCOCET/ROXICET) 5-325 MG per tablet Take 1 tablet by mouth every 6 (six) hours as needed.    . simethicone (MYLICON) 80 MG chewable tablet Chew 80  mg by mouth daily.     No current facility-administered medications for this visit.    Review of Systems General: no complaints  HEENT: no complaints  Lungs: no complaints  Cardiac: no complaints  GI: constipation  GU: no complaints  Musculoskeletal: back pain  Extremities: right leg pain  Skin: no complaints  Neuro: neuropathic pain in her RLE due to back issues  Endocrine: no complaints  Psych: no complaints      Objective:  Physical Examination:  BP 121/66 mmHg  Pulse 73  Temp(Src) 98.1 F (36.7 C) (Oral)  Wt 125 lb 7.1 oz (56.9 kg)   ECOG Performance Status: 1 - Symptomatic but completely ambulatory  General appearance: alert and appears stated age HEENT:PERRLA, extra ocular movement intact and sclera clear, anicteric Lymph node survey: non-palpable, axillary, inguinal, supraclavicular Cardiovascular: regular rate and rhythm Respiratory: normal air entry, lungs clear to auscultation Abdomen: soft, non-tender, without masses or organomegaly, no hernias and well healed incision Extremities: extremities normal, atraumatic,  no cyanosis or edema Neurological exam reveals alert, oriented, normal speech, no focal findings  Pelvic: exam chaperoned by nurse;  Vulva: normal appearing vulva with no masses, tenderness or lesions; Vagina: agglutinated and length is only 4 cm, no tumor seen or palpated; Adnexa: surgically absent; Uterus and Cervix: surgically absent, vaginal cuff not visualized due to agglutination;  Rectal: confirmatory     Assessment:  MALY LEMARR is a 81 y.o. female diagnosed with pelvic recurrence of stage IB grade 1 endometroid endometrial adenocarcinoma s/p brachytherapy with right pelvic side wall mass and hydronephrosis. New ER/PR+ breast cancer.    Plan:   Problem List Items Addressed This Visit      Genitourinary   Endometrial cancer ([place]) - Primary      I discussed case with Dr Oliva Bustard and Dr Baruch Gouty.  Since she has recurrent grade I endometrial cancer that appears localized to the pelvis it would be best to treat her with external pelvic radiation.  She will see Dr. Baruch Gouty today for consultation.  CT scan will be repeated after RT is finished to determine if any additional therapy is needed, and this could involve maintenance hormonal therapy that also might be appropriate for her breast cancer.  Cytotoxic chemotherpy with carboplatin/taxol could be reserved for treatment failure now or in the future.  She has a PCN for ureteral obstruction and interventional radiology will be attempting to convert this to an internalized stent.  She has leg pain due to the pelvic sidewall mass and is taking long and short acting narcotic as well as medications to manage constipation.  Dr Oliva Bustard says the breast cancer is very small (52mm) and that treatment can wait until the pelvic radiation is done.    Mellody Drown, MD  CC: Dr. Blanche East, MD [facility] [place], [place] [address] [phone]

## 2014-11-18 NOTE — Consult Note (Signed)
Except an outstanding is perfect of Radiation Oncology NEW PATIENT EVALUATION  Name: Traci [name]  MRN: [id number]  Date:   11/18/2014     DOB: 12/01/1929   This 81 y.o. 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  REFERRING PHYSICIAN: Sparks, Jeffrey D, MD  CHIEF COMPLAINT: No chief complaint on file.   DIAGNOSIS: There were no encounter diagnoses.   PREVIOUS INVESTIGATIONS:  CT scans are reviewed Clinical notes are reviewed Pathology reports are reviewed Case was discussed at weekly tumor conference  HPI: Patient is a 81-year-old female well-known to department having been treated back in early 2014 for stage IB grade 1 endometrioid adenocarcinoma with positive peritoneal cytology status post TAH/BSO. Tumor showed deep myometrial invasion no lymphovascular invasion was noted. No invasion of cervix or lower uterine segment was noted. Tumor was well-differentiated. She underwent vaginal brachytherapy and had done well until recently when she was diagnosed with a small 6 mm mass in her left breast which was invasive mammary carcinoma ER/PR positive HER-2/neu 2+. She was noted to have right hydronephrosis based on MRI scan of lumbosacral spine with stent placed in September 2016. CT scan demonstrated bilateral pelvic lymphadenopathy with large right iliac bone metastasis extending into the psoas muscle. Biopsy was performed of the pelvic mass showing well-differentiated adenocarcinoma morphologically seen similar to previous endometrial adenocarcinoma. Tumor is strongly positive for ER/PR status. She's been having a great deal of pain and difficulty ambulating. Pain extends down to her right lower extremity. She is currently on narcotic analgesics for pain control. Pain is not very CONTROL with current oral narcotic analgesics I will be discussing with medical oncology possibility of a fentanyl patch. I  been asked to evaluate the patient for possible radiation therapy to her pelvis and large right iliac bone metastasis.  PLANNED TREATMENT REGIMEN: Pelvic radiation  PAST MEDICAL HISTORY:  has a past medical history of History of esophageal stricture; GERD (gastroesophageal reflux disease); Inguinal lymphocyst; History of brachytherapy; History of shingles; History of colon polyps; Mild aphasia; Hearing loss; History of DVT (deep vein thrombosis); Stroke (HCC) (04/06/2011); Hiccups; Pain; Chronic kidney disease; Endometrial adenocarcinoma (HCC) (01/2012); Cancer of left breast (HCC) (10/20/2014); Bone cancer (HCC); Arthritis; and Gastric ulcer.    PAST SURGICAL HISTORY:  Past Surgical History  Procedure Laterality Date  . Esophageal dilation    . Thyroidectomy, partial    . Cataract extraction w/ intraocular lens implant      right  . Back surgery      fusion of L3&4  . Tonsillectomy    . Total abdominal hysterectomy w/ bilateral salpingoophorectomy Bilateral     staging biopsies  . Breast cyst aspiration N/A   . Breast biopsy Left 10/13/2014    path pending  . Cystoscopy w/ retrogrades Right 10/28/2014    Procedure: CYSTOSCOPY WITH RETROGRADE PYELOGRAM ATTEMPT, UNABLE TO PASS ;  Surgeon: Brian James Budzyn, MD;  Location: [facility];  Service: Urology;  Laterality: Right;  . Cystoscopy with stent placement Right 10/28/2014    Procedure: BLADDER BIOPSY WITH FULGERATION ;  Surgeon: Brian James Budzyn, MD;  Location: [facility] ORS;  Service: Urology;  Laterality: Right;  . Thyroid removed    . Eye surgery Bilateral     Cataract Extraction  . Abdominal hysterectomy      FAMILY HISTORY: family history includes Breast cancer in her maternal aunt; Stroke in her mother.  SOCIAL HISTORY:  reports that she has   never smoked. She has never used smokeless tobacco. She reports that she does not drink alcohol or use illicit drugs.  ALLERGIES: Penicillins; Hydrocodone-acetaminophen; Novocain; and Sulfa  antibiotics  MEDICATIONS:  Current Outpatient Prescriptions  Medication Sig Dispense Refill  . calcium carbonate (OS-CAL) 1250 MG chewable tablet Chew 1 tablet by mouth daily.    . ciprofloxacin (CIPRO) 500 MG tablet Take 500 mg by mouth 2 (two) times daily.    . clopidogrel (PLAVIX) 75 MG tablet Take 1 tablet by mouth daily.    . docusate sodium (COLACE) 100 MG capsule Take 100 mg by mouth 2 (two) times daily.    . gabapentin (NEURONTIN) 300 MG capsule Take 1 capsule by mouth 2 (two) times daily.    . Multiple Vitamins-Minerals (MULTIVITAMIN WITH MINERALS) tablet Take 1 tablet by mouth daily.    . OxyCODONE (OXYCONTIN) 10 mg T12A 12 hr tablet Take 1 tablet (10 mg total) by mouth every 12 (twelve) hours. 60 tablet 0  . oxyCODONE-acetaminophen (PERCOCET/ROXICET) 5-325 MG per tablet Take 1 tablet by mouth every 6 (six) hours as needed.    . simethicone (MYLICON) 80 MG chewable tablet Chew 80 mg by mouth daily.     No current facility-administered medications for this encounter.    ECOG PERFORMANCE STATUS:  1 - Symptomatic but completely ambulatory  REVIEW OF SYSTEMS: Except for the pain and difficulty ambulating Patient denies any weight loss, fatigue, weakness, fever, chills or night sweats. Patient denies any loss of vision, blurred vision. Patient denies any ringing  of the ears or hearing loss. No irregular heartbeat. Patient denies heart murmur or history of fainting. Patient denies any chest pain or pain radiating to her upper extremities. Patient denies any shortness of breath, difficulty breathing at night, cough or hemoptysis. Patient denies any swelling in the lower legs. Patient denies any nausea vomiting, vomiting of blood, or coffee ground material in the vomitus. Patient denies any stomach pain. Patient states has had normal bowel movements no significant constipation or diarrhea. Patient denies any dysuria, hematuria or significant nocturia. Patient denies any problems walking,  swelling in the joints or loss of balance. Patient denies any skin changes, loss of hair or loss of weight. Patient denies any excessive worrying or anxiety or significant depression. Patient denies any problems with insomnia. Patient denies excessive thirst, polyuria, polydipsia. Patient denies any swollen glands, patient denies easy bruising or easy bleeding. Patient denies any recent infections, allergies or URI. Patient "s visual fields have not changed significantly in recent time.    PHYSICAL EXAM: There were no vitals taken for this visit. Thin well-developed female in NAD. Range of motion of right lower extremity does not elicit pain. Motor sensory and DTR levels are equal and symmetric in the lower extremities bilaterally. Proprioception is intact. No significant swelling of her lower extremities is noted. Well-developed well-nourished patient in NAD. HEENT reveals PERLA, EOMI, discs not visualized.  Oral cavity is clear. No oral mucosal lesions are identified. Neck is clear without evidence of cervical or supraclavicular adenopathy. Lungs are clear to A&P. Cardiac examination is essentially unremarkable with regular rate and rhythm without murmur rub or thrill. Abdomen is benign with no organomegaly or masses noted. Motor sensory and DTR levels are equal and symmetric in the upper and lower extremities. Cranial nerves II through XII are grossly intact. Proprioception is intact. No peripheral adenopathy or edema is identified. No motor or sensory levels are noted. Crude visual fields are within normal range.  LABORATORY DATA: Pathology   reports reviewed    RADIOLOGY RESULTS: CT scans of chest abdomen and pelvis reviewed   IMPRESSION: Locally recurrent endometrial carcinoma in 81 year old female previously treated with vaginal brachytherapy also with breast cancer early-stage disease.  PLAN: At this time I discussed the case personally with medical oncology and GYN oncology. Based on her  enlarged pelvic lymph nodes would treat her whole pelvis to 4000 cGy boosting the right iliac metastasis up to another thousand centigray with external beam treatment. I've also discussed switching her Tofranil patch. Also have discussed possible Megace therapy with medical oncology. Medical oncology will be reevaluating her early next week. Risks and benefits of treatment including diarrhea, lower urinary tract symptoms, fatigue, alteration of blood counts, skin reaction, all were discussed in detail with the patient and her family. They all comprehend my treatment plan well. I've set up and ordered CT simulation for later this week.  I would like to take this opportunity for allowing me to participate in the care of your patient.Armstead Peaks., MD

## 2014-11-18 NOTE — Telephone Encounter (Signed)
Called to report that when she saw Dr Baruch Gouty today they were told Dr Oliva Bustard was going to give her a patch and a pill to shrink the tumor. They forgot about it and left. Asking if we can call it in to pharmacy. Per Dr Oliva Bustard, he will order a pain patch and discuss therapy when she comes in to see him next week. I called and spoke with patient adn relayed this message to her and asked that she keep a record of how many pain pills she takes a day to bring with her to her appt on the 19th.She repeated all this back to me

## 2014-11-19 ENCOUNTER — Ambulatory Visit
Admission: RE | Admit: 2014-11-19 | Discharge: 2014-11-19 | Disposition: A | Discharge status: Home / Self Care | Payer: Medicare Other | Source: Ambulatory Visit | Attending: Radiation Oncology | Admitting: Radiation Oncology

## 2014-11-19 ENCOUNTER — Telehealth: Payer: Self-pay | Admitting: *Deleted

## 2014-11-19 ENCOUNTER — Other Ambulatory Visit: Payer: Self-pay | Admitting: Radiology

## 2014-11-19 ENCOUNTER — Ambulatory Visit: Payer: Medicare Other

## 2014-11-19 DIAGNOSIS — Z510 Encounter for antineoplastic radiation therapy: Secondary | ICD-10-CM | POA: Diagnosis present

## 2014-11-19 DIAGNOSIS — C541 Malignant neoplasm of endometrium: Secondary | ICD-10-CM | POA: Diagnosis not present

## 2014-11-19 DIAGNOSIS — C775 Secondary and unspecified malignant neoplasm of intrapelvic lymph nodes: Secondary | ICD-10-CM | POA: Diagnosis not present

## 2014-11-19 DIAGNOSIS — C7951 Secondary malignant neoplasm of bone: Secondary | ICD-10-CM | POA: Diagnosis not present

## 2014-11-19 DIAGNOSIS — Z170 Estrogen receptor positive status [ER+]: Secondary | ICD-10-CM | POA: Diagnosis not present

## 2014-11-19 DIAGNOSIS — C7989 Secondary malignant neoplasm of other specified sites: Secondary | ICD-10-CM | POA: Diagnosis not present

## 2014-11-19 DIAGNOSIS — C50912 Malignant neoplasm of unspecified site of left female breast: Secondary | ICD-10-CM | POA: Diagnosis not present

## 2014-11-19 MED ORDER — OXYCODONE-ACETAMINOPHEN 5-325 MG PO TABS: 1.0000 | ORAL_TABLET | Freq: Four times a day (QID) | ORAL | Status: DC | PRN

## 2014-11-19 MED ORDER — FENTANYL 12 MCG/HR TD PT72: 12.5000 ug | MEDICATED_PATCH | TRANSDERMAL | Status: DC

## 2014-11-19 NOTE — Telephone Encounter (Signed)
Pt request fentanyl patch and refill of oxycodone for break-through pain. Rx given to patient while in clinic. Instructed pt that needs to stop oxycontin q12hrs.

## 2014-11-20 ENCOUNTER — Other Ambulatory Visit: Payer: Self-pay | Admitting: *Deleted

## 2014-11-20 ENCOUNTER — Ambulatory Visit
Admission: RE | Admit: 2014-11-20 | Discharge: 2014-11-20 | Disposition: A | Discharge status: Home / Self Care | Payer: Medicare Other | Source: Ambulatory Visit | Attending: Urology | Admitting: Urology

## 2014-11-20 DIAGNOSIS — C7951 Secondary malignant neoplasm of bone: Secondary | ICD-10-CM | POA: Insufficient documentation

## 2014-11-20 DIAGNOSIS — N131 Hydronephrosis with ureteral stricture, not elsewhere classified: Secondary | ICD-10-CM | POA: Insufficient documentation

## 2014-11-20 DIAGNOSIS — C50912 Malignant neoplasm of unspecified site of left female breast: Secondary | ICD-10-CM | POA: Insufficient documentation

## 2014-11-20 DIAGNOSIS — M1990 Unspecified osteoarthritis, unspecified site: Secondary | ICD-10-CM | POA: Insufficient documentation

## 2014-11-20 DIAGNOSIS — M79604 Pain in right leg: Secondary | ICD-10-CM | POA: Insufficient documentation

## 2014-11-20 DIAGNOSIS — K219 Gastro-esophageal reflux disease without esophagitis: Secondary | ICD-10-CM | POA: Insufficient documentation

## 2014-11-20 DIAGNOSIS — R590 Localized enlarged lymph nodes: Secondary | ICD-10-CM | POA: Insufficient documentation

## 2014-11-20 DIAGNOSIS — K831 Obstruction of bile duct: Secondary | ICD-10-CM

## 2014-11-20 DIAGNOSIS — Z452 Encounter for adjustment and management of vascular access device: Secondary | ICD-10-CM | POA: Insufficient documentation

## 2014-11-20 DIAGNOSIS — N189 Chronic kidney disease, unspecified: Secondary | ICD-10-CM | POA: Insufficient documentation

## 2014-11-20 DIAGNOSIS — C7911 Secondary malignant neoplasm of bladder: Secondary | ICD-10-CM | POA: Insufficient documentation

## 2014-11-20 DIAGNOSIS — Z90710 Acquired absence of both cervix and uterus: Secondary | ICD-10-CM | POA: Insufficient documentation

## 2014-11-20 DIAGNOSIS — Z823 Family history of stroke: Secondary | ICD-10-CM | POA: Insufficient documentation

## 2014-11-20 DIAGNOSIS — Z86718 Personal history of other venous thrombosis and embolism: Secondary | ICD-10-CM | POA: Insufficient documentation

## 2014-11-20 DIAGNOSIS — C541 Malignant neoplasm of endometrium: Secondary | ICD-10-CM

## 2014-11-20 DIAGNOSIS — Z8542 Personal history of malignant neoplasm of other parts of uterus: Secondary | ICD-10-CM | POA: Insufficient documentation

## 2014-11-20 DIAGNOSIS — Z803 Family history of malignant neoplasm of breast: Secondary | ICD-10-CM | POA: Insufficient documentation

## 2014-11-20 DIAGNOSIS — Z86010 Personal history of colonic polyps: Secondary | ICD-10-CM | POA: Insufficient documentation

## 2014-11-20 DIAGNOSIS — Z8673 Personal history of transient ischemic attack (TIA), and cerebral infarction without residual deficits: Secondary | ICD-10-CM | POA: Insufficient documentation

## 2014-11-20 MED ORDER — LIDOCAINE HCL (PF) 1 % IJ SOLN
INTRAMUSCULAR | Status: DC | PRN
  Administered 2014-11-20: 10 mL

## 2014-11-20 MED ORDER — MIDAZOLAM HCL 5 MG/5ML IJ SOLN
INTRAMUSCULAR | Status: AC | PRN
  Administered 2014-11-20 (×3): 0.5 mg via INTRAVENOUS

## 2014-11-20 MED ORDER — IOHEXOL 300 MG/ML  SOLN
30.0000 mL | Freq: Once | INTRAMUSCULAR | Status: DC | PRN
  Administered 2014-11-20: 10 mL
  Filled 2014-11-20: qty 30

## 2014-11-20 MED ORDER — FENTANYL CITRATE (PF) 100 MCG/2ML IJ SOLN
INTRAMUSCULAR | Status: AC | PRN
  Administered 2014-11-20: 50 ug via INTRAVENOUS
  Administered 2014-11-20: 25 ug via INTRAVENOUS

## 2014-11-20 MED ORDER — CIPROFLOXACIN IN D5W 400 MG/200ML IV SOLN
400.0000 mg | INTRAVENOUS | Status: AC
  Administered 2014-11-20: 400 mg via INTRAVENOUS

## 2014-11-20 MED ORDER — MIDAZOLAM HCL 5 MG/5ML IJ SOLN
INTRAMUSCULAR | Status: AC
  Filled 2014-11-20: qty 5

## 2014-11-20 MED ORDER — LIDOCAINE HCL (PF) 1 % IJ SOLN
INTRAMUSCULAR | Status: AC
  Filled 2014-11-20: qty 10

## 2014-11-20 MED ORDER — FENTANYL CITRATE (PF) 100 MCG/2ML IJ SOLN
INTRAMUSCULAR | Status: AC
  Filled 2014-11-20: qty 4

## 2014-11-20 MED ORDER — SODIUM CHLORIDE 0.9 % IV SOLN
Freq: Once | INTRAVENOUS | Status: AC
  Administered 2014-11-20: 11:00:00 via INTRAVENOUS

## 2014-11-20 NOTE — Procedures (Signed)
Post-Procedure Note  Pre-operative Diagnosis: Right ureter obstruction and metastatic endometrial cancer       Post-operative Diagnosis: same   Indications: Right ureter obstruction with existing right nephrostomy tube.  Procedure Details:   Obstruction in the mid right ureter.  Crossed the obstruction with wire and catheter.  Placed 8 Fr x 24 cm ureteral stent.  Stent was patent. Removed right nephrostomy tube.  Findings: Obstruction in mid right ureter.  Stent successfully placed.  Complications: None     Condition: Good  Plan: Discharge home.  Will need stent change/management by Urology.

## 2014-11-20 NOTE — H&P (Signed)
Chief Complaint: Right ureter blockage.  Referring Physician(s): [name]  History of Present Illness: Traci [name] is a 81 y.o. female with recurrent endometrial cancer with pelvic metastasis.  Patient had right hydronephrosis due to ureter obstruction, thought to be related to metastasis.  Patient has known metastasis in bladder.  Patient had a right percutaneous nephrostomy placed last week without complication.  No significant pain at tube site.  There continues to be bloody urine draining from tube and has not changed since tube placement.  Approximately 50-75 ml draining per day from the bag.  Patient is scheduled to start radiation therapy to pelvis in near future.  Her main complaint is severe right leg pain related to the metastatic disease in the pelvis.  She has a pain patch and continues to need oral pain meds to control the pain.    Past Medical History  Diagnosis Date  . History of esophageal stricture   . GERD (gastroesophageal reflux disease)   . Inguinal lymphocyst   . History of brachytherapy   . History of shingles   . History of colon polyps   . Mild aphasia   . Hearing loss   . History of DVT (deep vein thrombosis)   . Stroke ([place]) 04/06/2011    residual:  aphasia  . Hiccups   . Pain     SEVERE CHRONIC RIGHT LEG,HIP,FOOT  . Chronic kidney disease     HYDRONEPHROSIS  . Endometrial adenocarcinoma ([place]) 01/2012     stage Ib, grade 1, positive peritoneal cytology, no other risk factors or extra-uterine disease  . Cancer of left breast ([place]) 10/20/2014  . Bone cancer ([place])   . Arthritis     Degenerative Disc Disease  . Gastric ulcer     Past Surgical History  Procedure Laterality Date  . Esophageal dilation    . Thyroidectomy, partial    . Cataract extraction w/ intraocular lens implant      right  . Back surgery      fusion of L3&4  . Tonsillectomy    . Total abdominal hysterectomy w/ bilateral salpingoophorectomy Bilateral     staging  biopsies  . Breast cyst aspiration N/A   . Breast biopsy Left 10/13/2014    path pending  . Cystoscopy w/ retrogrades Right 10/28/2014    Procedure: CYSTOSCOPY WITH RETROGRADE PYELOGRAM ATTEMPT, UNABLE TO PASS ;  Surgeon: [name], MD;  Location: [facility];  Service: Urology;  Laterality: Right;  . Cystoscopy with stent placement Right 10/28/2014    Procedure: BLADDER BIOPSY WITH FULGERATION ;  Surgeon: [name], MD;  Location: [facility];  Service: Urology;  Laterality: Right;  . Thyroid removed    . Eye surgery Bilateral     Cataract Extraction  . Abdominal hysterectomy      Allergies: Penicillins; Hydrocodone-acetaminophen; Novocain; and Sulfa antibiotics  Medications: Prior to Admission medications   Medication Sig Start Date End Date Taking? Authorizing Provider  calcium carbonate (OS-CAL) 1250 MG chewable tablet Chew 1 tablet by mouth daily.   Yes Historical Provider, MD  ciprofloxacin (CIPRO) 500 MG tablet Take 500 mg by mouth 2 (two) times daily.   Yes Historical Provider, MD  docusate sodium (COLACE) 100 MG capsule Take 100 mg by mouth 2 (two) times daily.   Yes Historical Provider, MD  fentaNYL (DURAGESIC - DOSED MCG/HR) 12 MCG/HR Place 1 patch (12.5 mcg total) onto the skin every 3 (three) days. 11/19/14  Yes Forest Gleason, MD  gabapentin (  NEURONTIN) 300 MG capsule Take 1 capsule by mouth 2 (two) times daily. 09/10/14  Yes Historical Provider, MD  Multiple Vitamins-Minerals (MULTIVITAMIN WITH MINERALS) tablet Take 1 tablet by mouth daily.   Yes Historical Provider, MD  oxyCODONE-acetaminophen (PERCOCET/ROXICET) 5-325 MG tablet Take 1 tablet by mouth every 6 (six) hours as needed. Patient taking differently: Take 1-2 tablets by mouth every 6 (six) hours as needed.  11/19/14  Yes Forest Gleason, MD  simethicone (MYLICON) 80 MG chewable tablet Chew 80 mg by mouth daily.   Yes Historical Provider, MD  clopidogrel (PLAVIX) 75 MG tablet Take 1 tablet by mouth daily. 05/27/14    Historical Provider, MD     Family History  Problem Relation Age of Onset  . Stroke Mother   . Breast cancer Maternal Aunt     x 2    Social History   Social History  . Marital Status: Married    Spouse Name: N/A  . Number of Children: N/A  . Years of Education: N/A   Social History Main Topics  . Smoking status: Never Smoker   . Smokeless tobacco: Never Used  . Alcohol Use: No  . Drug Use: No  . Sexual Activity:    Partners: Male   Other Topics Concern  . None   Social History Narrative      Review of Systems  Constitutional: Negative.   Respiratory: Negative.   Cardiovascular: Negative.   Gastrointestinal: Negative.   Musculoskeletal:       Severe right leg pain.    Vital Signs: BP 119/73 mmHg  Temp(Src) 98.1 F (36.7 C) (Oral)  Resp 18  SpO2 100%  Physical Exam  Constitutional: She is oriented to person, place, and time.  Cardiovascular: Normal rate, regular rhythm and normal heart sounds.   Pulmonary/Chest: Effort normal and breath sounds normal.  Abdominal: Soft. Bowel sounds are normal. She exhibits no distension. There is no tenderness.  Genitourinary:  Nephrostomy intact.  Bloody urine in bag.  Neurological: She is alert and oriented to person, place, and time.    Mallampati Score:  MD Evaluation Airway: WNL Heart: WNL Abdomen: WNL Chest/ Lungs: WNL ASA  Classification: 2 Mallampati/Airway Score: Two  Imaging: Ct Chest W Contrast  11/09/2014  CLINICAL DATA:  Recurrent endometrial carcinoma. Newly diagnosed left breast carcinoma. Right hydronephrosis. EXAM: CT CHEST, ABDOMEN, AND PELVIS WITH CONTRAST TECHNIQUE: Multidetector CT imaging of the chest, abdomen and pelvis was performed following the standard protocol during bolus administration of intravenous contrast. CONTRAST:  62mL OMNIPAQUE IOHEXOL 300 MG/ML  SOLN COMPARISON:  AP CT on 04/04/2012 FINDINGS: CT CHEST FINDINGS Mediastinum/Lymph Nodes: No masses, pathologically enlarged  lymph nodes, or other significant abnormality. Lungs/Pleura: No pulmonary mass, infiltrate, or effusion. Biapical and right lower lobe scarring noted. Musculoskeletal: No chest wall mass or suspicious bone lesions identified. CT ABDOMEN PELVIS FINDINGS Hepatobiliary: No masses or other significant abnormality. Pancreas: No mass, inflammatory changes, or other significant abnormality. Spleen: Within normal limits in size and appearance. Adrenals/Urinary Tract: No masses identified. Diffuse right renal parenchymal atrophy is seen with severe right hydroureteronephrosis which is new since previous study. Stomach/Bowel: No evidence of obstruction, inflammatory process, or abnormal fluid collections. Vascular/Lymphatic: Bilateral iliac lymphadenopathy is seen within the pelvis, right side greater than left. Largest area measures 2.3 cm on image 94 of series 2. 1.5 cm soft tissue nodule or lymphadenopathy is also seen in the area the sigmoid mesocolon on image 93/series 2. Reproductive: Prior hysterectomy noted. 1.5 cm soft tissue nodule is  seen along the left vaginal cuff, suspicious for recurrent carcinoma. Other: None. Musculoskeletal: A lytic bone metastasis is seen involving the right ilium with associated soft tissue mass in the right iliopsoas region measuring 6.7 x 5.5 cm. IMPRESSION: Bilateral pelvic lymphadenopathy, right side greater than left, consistent with metastatic disease. Small soft tissue nodule involving the left vaginal cuff and 1.5 cm soft tissue nodule or lymphadenopathy in the sigmoid mesocolon, consistent with carcinoma. Right iliac bone metastasis with associated iliopsoas soft tissue mass. Severe right hydronephrosis and diffuse right renal parenchymal atrophy secondary to pelvic metastatic disease. No metastatic disease identified within the abdomen or chest. Electronically Signed   By: Earle Gell M.D.   On: 11/09/2014 11:19   [place] Intraoperative  11/13/2014  CLINICAL DATA:  Ultrasound was  provided for use by the ordering physician, and a technical charge was applied by the performing facility.  No radiologist interpretation/professional services rendered.   Ct Abdomen Pelvis W Contrast  11/09/2014  CLINICAL DATA:  Recurrent endometrial carcinoma. Newly diagnosed left breast carcinoma. Right hydronephrosis. EXAM: CT CHEST, ABDOMEN, AND PELVIS WITH CONTRAST TECHNIQUE: Multidetector CT imaging of the chest, abdomen and pelvis was performed following the standard protocol during bolus administration of intravenous contrast. CONTRAST:  61mL OMNIPAQUE IOHEXOL 300 MG/ML  SOLN COMPARISON:  AP CT on 04/04/2012 FINDINGS: CT CHEST FINDINGS Mediastinum/Lymph Nodes: No masses, pathologically enlarged lymph nodes, or other significant abnormality. Lungs/Pleura: No pulmonary mass, infiltrate, or effusion. Biapical and right lower lobe scarring noted. Musculoskeletal: No chest wall mass or suspicious bone lesions identified. CT ABDOMEN PELVIS FINDINGS Hepatobiliary: No masses or other significant abnormality. Pancreas: No mass, inflammatory changes, or other significant abnormality. Spleen: Within normal limits in size and appearance. Adrenals/Urinary Tract: No masses identified. Diffuse right renal parenchymal atrophy is seen with severe right hydroureteronephrosis which is new since previous study. Stomach/Bowel: No evidence of obstruction, inflammatory process, or abnormal fluid collections. Vascular/Lymphatic: Bilateral iliac lymphadenopathy is seen within the pelvis, right side greater than left. Largest area measures 2.3 cm on image 94 of series 2. 1.5 cm soft tissue nodule or lymphadenopathy is also seen in the area the sigmoid mesocolon on image 93/series 2. Reproductive: Prior hysterectomy noted. 1.5 cm soft tissue nodule is seen along the left vaginal cuff, suspicious for recurrent carcinoma. Other: None. Musculoskeletal: A lytic bone metastasis is seen involving the right ilium with associated soft  tissue mass in the right iliopsoas region measuring 6.7 x 5.5 cm. IMPRESSION: Bilateral pelvic lymphadenopathy, right side greater than left, consistent with metastatic disease. Small soft tissue nodule involving the left vaginal cuff and 1.5 cm soft tissue nodule or lymphadenopathy in the sigmoid mesocolon, consistent with carcinoma. Right iliac bone metastasis with associated iliopsoas soft tissue mass. Severe right hydronephrosis and diffuse right renal parenchymal atrophy secondary to pelvic metastatic disease. No metastatic disease identified within the abdomen or chest. Electronically Signed   By: Earle Gell M.D.   On: 11/09/2014 11:19   Ir Nephrostomy Placement Right  11/13/2014  CLINICAL DATA:  81 year old with metastatic endometrial cancer. Obstruction of the right ureter with severe right hydronephrosis. EXAM: PLACEMENT OF PERCUTANEOUS RIGHT NEPHROSTOMY TUBE WITH ULTRASOUND AND FLUOROSCOPIC GUIDANCE Physician: Stephan Minister. Mukund Weinreb, MD FLUOROSCOPY TIME:  1 minutes and 6 seconds. MEDICATIONS: Fentanyl.  Ciprofloxacin 400 mg ANESTHESIA/SEDATION: A radiology nurse monitored the patient for the procedure. PROCEDURE: The procedure was explained to the patient. The risks and benefits of the procedure were discussed and the patient's questions were addressed. Informed consent was obtained from  the patient. The patient was placed prone on the interventional table. Ultrasound was used to localize the right kidney. The right flank was prepped and draped in a sterile fashion. Maximal barrier sterile technique was utilized including caps, mask, sterile gowns, sterile gloves, sterile drape, hand hygiene and skin antiseptic. Skin and soft tissues were anesthetized with 1% lidocaine. A 22 gauge Chiba needle was directed into a dilated calyx with ultrasound guidance. Contrast injection confirmed placement in the collecting system. A 0.018 wire was advanced into the dilated renal pelvis. An Accustick dilator set was placed. The  tract was dilated to accommodate a 10 [place] multipurpose drain. Approximately 150 mL of amber colored urine was removed. Catheter was sutured to skin and attached to gravity bag. Bandage placed at the catheter site. FINDINGS: Severe right hydronephrosis. Needle directed into a dilated calyx. 150 mL of fluid was removed. Renal collecting system was decompressed at the end of the procedure. Estimated blood loss: Minimal COMPLICATIONS: None IMPRESSION: Successful right percutaneous nephrostomy tube placement with ultrasound and fluoroscopic guidance. PLAN: Patient is scheduled to return in 1 week for attempted placement of antegrade ureter stent. Electronically Signed   By: Markus Daft M.D.   On: 11/13/2014 12:57    Labs:  CBC:  Recent Labs  11/13/14 0713  WBC 5.4  HGB 13.3  HCT 39.8  PLT 255    COAGS:  Recent Labs  02/09/14 0940 11/13/14 0713  INR 1.0 0.97  APTT  --  31    BMP:  Recent Labs  03/05/14 1156 11/09/14 1027 11/17/14 0906  NA  --   --  140  K 4.3  --  4.1  CL  --   --  104  CO2  --   --  29  GLUCOSE  --   --  100*  BUN  --   --  23*  CALCIUM  --   --  9.4  CREATININE  --  1.20* 1.13*  GFRNONAA  --   --  44*  GFRAA  --   --  51*    LIVER FUNCTION TESTS: No results for input(s): BILITOT, AST, ALT, ALKPHOS, PROT, ALBUMIN in the last 8760 hours.  TUMOR MARKERS: No results for input(s): AFPTM, CEA, CA199, CHROMGRNA in the last 8760 hours.  Assessment and Plan: 81 yo with metastatic endometrial cancer causing obstruction of right ureter.  Patient had a right nephrostomy placed last week without complication.  Discussed placement of antegrade ureter stent with patient and husband.  They understand that this procedure may be unsuccessful if we cannot cross the obstruction.  Risks of bleeding and infection discussed with patient and informed consent obtained.  Will attempt stent placement with fluoroscopy with moderate sedation.     SignedCarylon Perches 11/20/2014, 10:52 AM

## 2014-11-23 ENCOUNTER — Telehealth: Payer: Self-pay | Admitting: Urology

## 2014-11-23 DIAGNOSIS — Z510 Encounter for antineoplastic radiation therapy: Secondary | ICD-10-CM | POA: Diagnosis not present

## 2014-11-23 NOTE — Progress Notes (Signed)
Card sent for follow up.

## 2014-11-23 NOTE — Telephone Encounter (Signed)
°    °    Patient will need cysto, right retrograde pyelogram,right ureteral stent exchange in 3 months. Thanks.

## 2014-11-25 ENCOUNTER — Ambulatory Visit
Admission: RE | Admit: 2014-11-25 | Discharge: 2014-11-25 | Disposition: A | Discharge status: Home / Self Care | Payer: Medicare Other | Source: Ambulatory Visit | Attending: Radiation Oncology | Admitting: Radiation Oncology

## 2014-11-25 ENCOUNTER — Inpatient Hospital Stay (HOSPITAL_BASED_OUTPATIENT_CLINIC_OR_DEPARTMENT_OTHER): Payer: Medicare Other | Admitting: Oncology

## 2014-11-25 VITALS — BP 123/75 | HR 75 | Temp 95.6°F | Ht 60.0 in | Wt 124.1 lb

## 2014-11-25 DIAGNOSIS — C7951 Secondary malignant neoplasm of bone: Secondary | ICD-10-CM | POA: Diagnosis not present

## 2014-11-25 DIAGNOSIS — Z510 Encounter for antineoplastic radiation therapy: Secondary | ICD-10-CM | POA: Diagnosis not present

## 2014-11-25 DIAGNOSIS — C541 Malignant neoplasm of endometrium: Secondary | ICD-10-CM | POA: Diagnosis not present

## 2014-11-25 DIAGNOSIS — N1330 Unspecified hydronephrosis: Secondary | ICD-10-CM

## 2014-11-25 DIAGNOSIS — Z923 Personal history of irradiation: Secondary | ICD-10-CM

## 2014-11-25 DIAGNOSIS — C7911 Secondary malignant neoplasm of bladder: Secondary | ICD-10-CM | POA: Diagnosis not present

## 2014-11-25 DIAGNOSIS — Z90722 Acquired absence of ovaries, bilateral: Secondary | ICD-10-CM

## 2014-11-25 DIAGNOSIS — C50812 Malignant neoplasm of overlapping sites of left female breast: Secondary | ICD-10-CM

## 2014-11-25 DIAGNOSIS — G893 Neoplasm related pain (acute) (chronic): Secondary | ICD-10-CM

## 2014-11-25 DIAGNOSIS — Z8542 Personal history of malignant neoplasm of other parts of uterus: Secondary | ICD-10-CM

## 2014-11-25 DIAGNOSIS — R590 Localized enlarged lymph nodes: Secondary | ICD-10-CM

## 2014-11-25 DIAGNOSIS — Z90710 Acquired absence of both cervix and uterus: Secondary | ICD-10-CM

## 2014-11-25 DIAGNOSIS — Z170 Estrogen receptor positive status [ER+]: Secondary | ICD-10-CM

## 2014-11-25 MED ORDER — FENTANYL 25 MCG/HR TD PT72: 25.0000 ug | MEDICATED_PATCH | TRANSDERMAL | Status: DC

## 2014-11-26 ENCOUNTER — Ambulatory Visit
Admission: RE | Admit: 2014-11-26 | Discharge: 2014-11-26 | Disposition: A | Discharge status: Home / Self Care | Payer: Medicare Other | Source: Ambulatory Visit | Attending: Surgery | Admitting: Surgery

## 2014-11-26 ENCOUNTER — Encounter
Admission: RE | Disposition: A | Discharge status: Home / Self Care | Payer: Self-pay | Source: Ambulatory Visit | Attending: Surgery

## 2014-11-26 ENCOUNTER — Ambulatory Visit: Payer: Medicare Other | Admitting: Anesthesiology

## 2014-11-26 ENCOUNTER — Ambulatory Visit: Payer: Medicare Other

## 2014-11-26 ENCOUNTER — Encounter: Payer: Self-pay | Admitting: *Deleted

## 2014-11-26 DIAGNOSIS — Z79899 Other long term (current) drug therapy: Secondary | ICD-10-CM | POA: Diagnosis not present

## 2014-11-26 DIAGNOSIS — Z823 Family history of stroke: Secondary | ICD-10-CM | POA: Insufficient documentation

## 2014-11-26 DIAGNOSIS — Z9842 Cataract extraction status, left eye: Secondary | ICD-10-CM | POA: Insufficient documentation

## 2014-11-26 DIAGNOSIS — Z8669 Personal history of other diseases of the nervous system and sense organs: Secondary | ICD-10-CM | POA: Diagnosis not present

## 2014-11-26 DIAGNOSIS — K219 Gastro-esophageal reflux disease without esophagitis: Secondary | ICD-10-CM | POA: Insufficient documentation

## 2014-11-26 DIAGNOSIS — Z886 Allergy status to analgesic agent status: Secondary | ICD-10-CM | POA: Insufficient documentation

## 2014-11-26 DIAGNOSIS — Z803 Family history of malignant neoplasm of breast: Secondary | ICD-10-CM | POA: Diagnosis not present

## 2014-11-26 DIAGNOSIS — N189 Chronic kidney disease, unspecified: Secondary | ICD-10-CM | POA: Diagnosis not present

## 2014-11-26 DIAGNOSIS — Z880 Allergy status to penicillin: Secondary | ICD-10-CM | POA: Insufficient documentation

## 2014-11-26 DIAGNOSIS — C50912 Malignant neoplasm of unspecified site of left female breast: Secondary | ICD-10-CM | POA: Insufficient documentation

## 2014-11-26 DIAGNOSIS — Z90710 Acquired absence of both cervix and uterus: Secondary | ICD-10-CM | POA: Diagnosis not present

## 2014-11-26 DIAGNOSIS — E890 Postprocedural hypothyroidism: Secondary | ICD-10-CM | POA: Insufficient documentation

## 2014-11-26 DIAGNOSIS — H9190 Unspecified hearing loss, unspecified ear: Secondary | ICD-10-CM | POA: Diagnosis not present

## 2014-11-26 DIAGNOSIS — Z884 Allergy status to anesthetic agent status: Secondary | ICD-10-CM | POA: Diagnosis not present

## 2014-11-26 DIAGNOSIS — Z86718 Personal history of other venous thrombosis and embolism: Secondary | ICD-10-CM | POA: Insufficient documentation

## 2014-11-26 DIAGNOSIS — Z8711 Personal history of peptic ulcer disease: Secondary | ICD-10-CM | POA: Insufficient documentation

## 2014-11-26 DIAGNOSIS — Z9841 Cataract extraction status, right eye: Secondary | ICD-10-CM | POA: Insufficient documentation

## 2014-11-26 DIAGNOSIS — Z8542 Personal history of malignant neoplasm of other parts of uterus: Secondary | ICD-10-CM | POA: Insufficient documentation

## 2014-11-26 DIAGNOSIS — R1031 Right lower quadrant pain: Secondary | ICD-10-CM | POA: Diagnosis not present

## 2014-11-26 DIAGNOSIS — C679 Malignant neoplasm of bladder, unspecified: Secondary | ICD-10-CM

## 2014-11-26 DIAGNOSIS — Z8673 Personal history of transient ischemic attack (TIA), and cerebral infarction without residual deficits: Secondary | ICD-10-CM | POA: Diagnosis not present

## 2014-11-26 DIAGNOSIS — Z885 Allergy status to narcotic agent status: Secondary | ICD-10-CM | POA: Diagnosis not present

## 2014-11-26 DIAGNOSIS — Z882 Allergy status to sulfonamides status: Secondary | ICD-10-CM | POA: Diagnosis not present

## 2014-11-26 DIAGNOSIS — N139 Obstructive and reflux uropathy, unspecified: Secondary | ICD-10-CM | POA: Diagnosis not present

## 2014-11-26 DIAGNOSIS — N138 Other obstructive and reflux uropathy: Secondary | ICD-10-CM | POA: Insufficient documentation

## 2014-11-26 DIAGNOSIS — Z86010 Personal history of colonic polyps: Secondary | ICD-10-CM | POA: Insufficient documentation

## 2014-11-26 HISTORY — PX: PORTACATH PLACEMENT: SHX2246

## 2014-11-26 SURGERY — INSERTION, TUNNELED CENTRAL VENOUS DEVICE, WITH PORT
Anesthesia: Monitor Anesthesia Care | Laterality: Right | Wound class: Clean

## 2014-11-26 MED ORDER — SODIUM CHLORIDE 0.9 % IJ SOLN
INTRAMUSCULAR | Status: AC
  Filled 2014-11-26: qty 50

## 2014-11-26 MED ORDER — FENTANYL CITRATE (PF) 100 MCG/2ML IJ SOLN
25.0000 ug | INTRAMUSCULAR | Status: DC | PRN
  Administered 2014-11-26 (×4): 25 ug via INTRAVENOUS

## 2014-11-26 MED ORDER — OXYCODONE HCL 5 MG PO TABS
ORAL_TABLET | ORAL | Status: AC
  Filled 2014-11-26: qty 1

## 2014-11-26 MED ORDER — OXYCODONE HCL 5 MG PO TABS
5.0000 mg | ORAL_TABLET | Freq: Once | ORAL | Status: AC | PRN
  Administered 2014-11-26: 5 mg via ORAL

## 2014-11-26 MED ORDER — MIDAZOLAM HCL 2 MG/2ML IJ SOLN
INTRAMUSCULAR | Status: DC | PRN
  Administered 2014-11-26: 1 mg via INTRAVENOUS

## 2014-11-26 MED ORDER — VANCOMYCIN HCL IN DEXTROSE 1-5 GM/200ML-% IV SOLN
1000.0000 mg | Freq: Once | INTRAVENOUS | Status: AC
  Administered 2014-11-26: 1000 mg via INTRAVENOUS

## 2014-11-26 MED ORDER — PROPOFOL 500 MG/50ML IV EMUL
INTRAVENOUS | Status: DC | PRN
  Administered 2014-11-26: 50 ug/kg/min via INTRAVENOUS

## 2014-11-26 MED ORDER — HEPARIN SODIUM (PORCINE) 5000 UNIT/ML IJ SOLN
INTRAMUSCULAR | Status: AC
  Filled 2014-11-26: qty 1

## 2014-11-26 MED ORDER — LIDOCAINE HCL (PF) 1 % IJ SOLN
INTRAMUSCULAR | Status: DC | PRN
  Administered 2014-11-26: 5 mL via SUBCUTANEOUS

## 2014-11-26 MED ORDER — OXYCODONE HCL 5 MG/5ML PO SOLN
5.0000 mg | Freq: Once | ORAL | Status: AC | PRN
  Administered 2014-11-26: 5 mg via ORAL

## 2014-11-26 MED ORDER — LACTATED RINGERS IV SOLN
INTRAVENOUS | Status: DC | PRN
  Administered 2014-11-26: 08:00:00 via INTRAVENOUS

## 2014-11-26 MED ORDER — SODIUM CHLORIDE 0.9 % IV SOLN
INTRAVENOUS | Status: DC | PRN
  Administered 2014-11-26: 10 mL via INTRAMUSCULAR

## 2014-11-26 MED ORDER — LIDOCAINE HCL (CARDIAC) 20 MG/ML IV SOLN
INTRAVENOUS | Status: DC | PRN
Start: 2014-11-26 — End: 2014-11-26
  Administered 2014-11-26: 30 mg via INTRAVENOUS

## 2014-11-26 MED ORDER — VANCOMYCIN HCL IN DEXTROSE 1-5 GM/200ML-% IV SOLN
INTRAVENOUS | Status: AC
  Filled 2014-11-26: qty 200

## 2014-11-26 MED ORDER — FENTANYL CITRATE (PF) 100 MCG/2ML IJ SOLN
INTRAMUSCULAR | Status: AC
  Administered 2014-11-26: 25 ug via INTRAVENOUS
  Filled 2014-11-26: qty 2

## 2014-11-26 MED ORDER — LIDOCAINE HCL (PF) 1 % IJ SOLN
INTRAMUSCULAR | Status: AC
  Filled 2014-11-26: qty 30

## 2014-11-26 SURGICAL SUPPLY — 18 items
CANISTER SUCT 1200ML W/VALVE (MISCELLANEOUS) ×3 IMPLANT
CHLORAPREP W/TINT 26ML (MISCELLANEOUS) ×3 IMPLANT
COVER LIGHT HANDLE STERIS (MISCELLANEOUS) ×6 IMPLANT
GLOVE BIO SURGEON STRL SZ7.5 (GLOVE) ×9 IMPLANT
GOWN STRL REUS W/ TWL LRG LVL3 (GOWN DISPOSABLE) ×2 IMPLANT
GOWN STRL REUS W/TWL LRG LVL3 (GOWN DISPOSABLE) ×4
KIT PORT POWER 8FR ISP CVUE (Catheter) ×3 IMPLANT
KIT RM TURNOVER STRD PROC AR (KITS) ×3 IMPLANT
LABEL OR SOLS (LABEL) ×3 IMPLANT
LIQUID BAND (GAUZE/BANDAGES/DRESSINGS) ×3 IMPLANT
NEEDLE FILTER BLUNT 18X 1/2SAF (NEEDLE) ×2
NEEDLE FILTER BLUNT 18X1 1/2 (NEEDLE) ×1 IMPLANT
PACK PORT-A-CATH (MISCELLANEOUS) ×3 IMPLANT
PAD GROUND ADULT SPLIT (MISCELLANEOUS) ×3 IMPLANT
SUT SILK 4 0 SH (SUTURE) ×3 IMPLANT
SUT VIC AB 5-0 RB1 27 (SUTURE) ×3 IMPLANT
SYR 3ML LL SCALE MARK (SYRINGE) ×3 IMPLANT
SYRINGE 10CC LL (SYRINGE) ×3 IMPLANT

## 2014-11-26 NOTE — Anesthesia Postprocedure Evaluation (Signed)
  Anesthesia Post-op Note  Patient: Traci [name]  Procedure(s) Performed: Procedure(s): INSERTION PORT-A-CATH (Right)  Anesthesia type:General LMA, MAC, General  Patient location: PACU  Post pain: Pain level controlled  Post assessment: Post-op Vital signs reviewed, Patient's Cardiovascular Status Stable, Respiratory Function Stable, Patent Airway and No signs of Nausea or vomiting  Post vital signs: Reviewed and stable  Last Vitals:  Filed Vitals:   11/26/14 1000  BP: 160/70  Pulse: 79  Temp:   Resp: 18    Level of consciousness: awake, alert  and patient cooperative  Complications: No apparent anesthesia complications

## 2014-11-26 NOTE — H&P (Signed)
Karlina GRETTEL RAMES is an 81 y.o. female.   Chief Complaint: Pain in right lower quadrant of her abdomen and right leg.  HPI: She was recently seen in the office for evaluation of left breast cancer. She also has had recent right lower quadrant abdominal pain extending down into the leg and has had recent findings of obstructive uropathy with bladder cancer now needing radiation therapy and chemotherapy. Port-A-Cath was recommended for venous access.  Past Medical History  Diagnosis Date  . History of esophageal stricture   . GERD (gastroesophageal reflux disease)   . Inguinal lymphocyst   . History of brachytherapy   . History of shingles   . History of colon polyps   . Mild aphasia   . Hearing loss   . History of DVT (deep vein thrombosis)   . Stroke ([place]) 04/06/2011    residual:  aphasia  . Hiccups   . Pain     SEVERE CHRONIC RIGHT LEG,HIP,FOOT  . Chronic kidney disease     HYDRONEPHROSIS  . Endometrial adenocarcinoma ([place]) 01/2012     stage Ib, grade 1, positive peritoneal cytology, no other risk factors or extra-uterine disease  . Cancer of left breast ([place]) 10/20/2014  . Bone cancer ([place])   . Arthritis     Degenerative Disc Disease  . Gastric ulcer     Past Surgical History  Procedure Laterality Date  . Esophageal dilation    . Thyroidectomy, partial    . Cataract extraction w/ intraocular lens implant      right  . Back surgery      fusion of L3&4  . Tonsillectomy    . Total abdominal hysterectomy w/ bilateral salpingoophorectomy Bilateral     staging biopsies  . Breast cyst aspiration N/A   . Breast biopsy Left 10/13/2014    path pending  . Cystoscopy w/ retrogrades Right 10/28/2014    Procedure: CYSTOSCOPY WITH RETROGRADE PYELOGRAM ATTEMPT, UNABLE TO PASS ;  Surgeon: [name], MD;  Location: [facility] ORS;  Service: Urology;  Laterality: Right;  . Cystoscopy with stent placement Right 10/28/2014    Procedure: BLADDER BIOPSY WITH FULGERATION ;  Surgeon: [name], MD;  Location: [facility] ORS;  Service: Urology;  Laterality: Right;  . Thyroid removed    . Eye surgery Bilateral     Cataract Extraction  . Abdominal hysterectomy      Family History  Problem Relation Age of Onset  . Stroke Mother   . Breast cancer Maternal Aunt     x 2   Social History:  reports that she has never smoked. She has never used smokeless tobacco. She reports that she does not drink alcohol or use illicit drugs.  Allergies:  Allergies  Allergen Reactions  . Penicillins Rash  . Hydrocodone-Acetaminophen Nausea Only  . Novocain [Procaine] Other (See Comments) and Nausea And Vomiting    Chest pain  . Sulfa Antibiotics Other (See Comments)    CHEST PAIN    Medications Prior to Admission  Medication Sig Dispense Refill  . docusate sodium (COLACE) 100 MG capsule Take 100 mg by mouth 2 (two) times daily.    Derrill Memo ON 11/30/2014] fentaNYL (DURAGESIC - DOSED MCG/HR) 25 MCG/HR patch Place 1 patch (25 mcg total) onto the skin every 3 (three) days. 10 patch 0  . gabapentin (NEURONTIN) 300 MG capsule Take 1 capsule by mouth 2 (two) times daily.    . Multiple Vitamins-Minerals (MULTIVITAMIN WITH MINERALS) tablet Take 1 tablet by mouth  daily.    . oxyCODONE-acetaminophen (PERCOCET/ROXICET) 5-325 MG tablet Take 1 tablet by mouth every 6 (six) hours as needed. (Patient taking differently: Take 1-2 tablets by mouth every 6 (six) hours as needed. ) 60 tablet 0  . clopidogrel (PLAVIX) 75 MG tablet Take 1 tablet by mouth daily.      No results found for this or any previous visit (from the past 48 hour(s)). No results found.  ROS she reports the right lower quadrant pain extending down into the right leg for which she is taking narcotic analgesics. She reports no symptoms related to head or neck. She reports no dyspnea.  Blood pressure 149/73, pulse 80, temperature 98 F (36.7 C), temperature source Tympanic, resp. rate 16, SpO2 99 %. Physical Exam  GENERAL:  Awake alert  and oriented and in no acute distress.  HEENT:  Head is normocephalic.  Pupils are equal reactive to light.  Extraocular movements are intact. Sclera is clear.  Pharynx is clear.  NECK:  Supple with no palpable mass and no adenopathy. There is an old thyroidectomy scar which is well-healed.  Chest: With kyphosis, normal appearance of clavicles  LUNGS:  Clear without rales rhonchi or wheezes.  HEART:  Regular rhythm S1-S2, without murmur.  Extremities with no deep ended edema  Assessment/Plan left breast cancer and bladder cancer   I discussed insertion of central venous catheter was subcutaneous infusion port. Discussed the operation care risk and benefits with her.  Rochel Brome 11/26/2014, 7:29 AM

## 2014-11-26 NOTE — Discharge Instructions (Addendum)
Take Tylenol if needed for pain.  May shower.  Follow-up at the cancer center.  Resume Plavix on SaturdayAMBULATORY SURGERY  DISCHARGE INSTRUCTIONS   1) The drugs that you were given will stay in your system until tomorrow so for the next 24 hours you should not:  A) Drive an automobile B) Make any legal decisions C) Drink any alcoholic beverage   2) You may resume regular meals tomorrow.  Today it is better to start with liquids and gradually work up to solid foods.  You may eat anything you prefer, but it is better to start with liquids, then soup and crackers, and gradually work up to solid foods.   3) Please notify your doctor immediately if you have any unusual bleeding, trouble breathing, redness and pain at the surgery site, drainage, fever, or pain not relieved by medication.    4) Additional Instructions:        Please contact your physician with any problems or Same Day Surgery at [phone], Monday through Friday 6 am to 4 pm, or [place] at [facility] number at [phone].

## 2014-11-26 NOTE — Op Note (Signed)
OPERATIVE REPORT  PREOPERATIVE  DIAGNOSIS: . Bladder cancer, left breast cancer   POSTOPERATIVE DIAGNOSIS: . Bladder cancer, left breast cancer  PROCEDURE: . Insertion central venous catheter with subcutaneous infusion port  ANESTHESIA:  General  SURGEON: Rochel Brome  MD   INDICATIONS: . She had recent findings of left breast cancer and also findings of bladder cancer now needing central venous access for chemotherapy.  With the patient on the operating table in the supine position a rolled sheet was placed behind the shoulder blades to extend the neck. She was monitored by the anesthesia staff and sedated. The neck and chest wall were prepared with ChloraPrep and draped in a sterile manner.  The skin beneath the right clavicle was infiltrated with 1% Xylocaine. A transversely oriented 3 cm incision was made and carried down through subcutaneous tissues. Several small bleeding points were cauterized. A subcutaneous pouch was created large enough to admit the ClearView port. The jugular vein was identified with ultrasound. The skin overlying the jugular vein was infiltrated with 1% Xylocaine. A transversely oriented 5 mm incision was made and carried down through subcutaneous tissues. A needle was inserted into the jugular vein using ultrasound guidance. An image was saved for the paper chart. A guidewire was advanced down through the needle into the central circulation. Fluoroscopy was used to demonstrate the location of the guidewire in the vena cava. The dilator and introducer sheath were advanced over the guidewire. The guidewire and dilator were removed. The catheter was placed down through the sheath and the sheath was peeled away. Fluoroscopy was used to demonstrate the tip of the catheter in the superior vena cava. An image was saved for the paper chart. The catheter was tunneled to the subclavian incision and pressure held over the tunnel site. The catheter was cut to fit and attached to  the ClearView port and accessed with a Huber needle aspirating a trace of blood and flushing with 5 cc of heparinized saline solution. The port was placed into the subcutaneous pouch and sutured to the deep fascia with 4-0 silk. Hemostasis was intact. Subcutaneous tissues were approximated with 5-0 Vicryl. Both skin incisions were closed with 5-0 Vicryl subcuticular suture and LiquiBand. The patient tolerated surgery satisfactorily and was prepared for transfer to the recovery room.  Rochel Brome M.D.

## 2014-11-26 NOTE — Transfer of Care (Signed)
Immediate Anesthesia Transfer of Care Note  Patient: Traci [name]  Procedure(s) Performed: Procedure(s): INSERTION PORT-A-CATH (Right)  Patient Location: PACU  Anesthesia Type:General  Level of Consciousness: awake, alert  and oriented  Airway & Oxygen Therapy: Patient Spontanous Breathing and Patient connected to nasal cannula oxygen  Post-op Assessment: Report given to RN and Post -op Vital signs reviewed and stable  Post vital signs: Reviewed and stable  Last Vitals:  Filed Vitals:   11/26/14 0837  BP: 157/83  Pulse: 78  Temp: 36.6 C  Resp: 11    Complications: No apparent anesthesia complications

## 2014-11-26 NOTE — Anesthesia Preprocedure Evaluation (Addendum)
Anesthesia Evaluation  Patient identified by MRN, date of birth, ID band Patient awake    Reviewed: Allergy & Precautions, H&P , NPO status , Patient's Chart, lab work & pertinent test results  History of Anesthesia Complications Negative for: history of anesthetic complications  Airway Mallampati: III  TM Distance: <3 FB Neck ROM: limited    Dental  (+) Poor Dentition, Missing, Upper Dentures, Lower Dentures   Pulmonary neg pulmonary ROS, neg shortness of breath,    Pulmonary exam normal breath sounds clear to auscultation       Cardiovascular Exercise Tolerance: Good hypertension, (-) angina(-) Past MI and (-) DOE Normal cardiovascular exam Rhythm:regular Rate:Normal     Neuro/Psych Seizures -,   Neuromuscular disease CVA, Residual Symptoms negative psych ROS   GI/Hepatic Neg liver ROS, PUD, GERD  Controlled,  Endo/Other  Hypothyroidism   Renal/GU Renal disease  negative genitourinary   Musculoskeletal  (+) Arthritis ,   Abdominal   Peds  Hematology negative hematology ROS (+)   Anesthesia Other Findings Past Medical History:   History of esophageal stricture                              GERD (gastroesophageal reflux disease)                       Inguinal lymphocyst                                          History of brachytherapy                                     History of shingles                                          History of colon polyps                                      Mild aphasia                                                 Hearing loss                                                 History of DVT (deep vein thrombosis)                        Stroke ([place])                                    04/06/2011      Comment:residual:  aphasia   Hiccups  Pain                                                           Comment:SEVERE CHRONIC RIGHT  LEG,HIP,FOOT   Chronic kidney disease                                         Comment:HYDRONEPHROSIS   Endometrial adenocarcinoma ([place])                01/2012        Comment: stage Ib, grade 1, positive peritoneal               cytology, no other risk factors or               extra-uterine disease   Cancer of left breast ([place])                     10/20/2014    Bone cancer ([place])                                            Arthritis                                                      Comment:Degenerative Disc Disease   Gastric ulcer                                               Past Surgical History:   ESOPHAGEAL DILATION                                           THYROIDECTOMY, PARTIAL                                        CATARACT EXTRACTION W/ INTRAOCULAR LENS IMPLANT                 Comment:right   BACK SURGERY                                                    Comment:fusion of L3&4   TONSILLECTOMY                                                 TOTAL ABDOMINAL HYSTERECTOMY W/ BILATERAL SALP* Bilateral  Comment:staging biopsies   BREAST CYST ASPIRATION                          N/A              BREAST BIOPSY                                   Left 10/13/2014       Comment:path pending   CYSTOSCOPY W/ RETROGRADES                       Right 10/28/2014      Comment:Procedure: CYSTOSCOPY WITH RETROGRADE PYELOGRAM              ATTEMPT, UNABLE TO PASS ;  Surgeon: [name], MD;  Location: [facility] ORS;  Service:               Urology;  Laterality: Right;   CYSTOSCOPY WITH STENT PLACEMENT                 Right 10/28/2014      Comment:Procedure: BLADDER BIOPSY WITH FULGERATION ;                Surgeon: [name], MD;  Location:               [facility] ORS;  Service: Urology;  Laterality:               Right;   thyroid removed                                               EYE SURGERY                                     Bilateral                 Comment:Cataract Extraction   ABDOMINAL HYSTERECTOMY                                          Reproductive/Obstetrics negative OB ROS                             Anesthesia Physical Anesthesia Plan  ASA: III  Anesthesia Plan: General LMA, MAC and General   Post-op Pain Management:    Induction:   Airway Management Planned:   Additional Equipment:   Intra-op Plan:   Post-operative Plan:   Informed Consent: I have reviewed the patients History and Physical, chart, labs and discussed the procedure including the risks, benefits and alternatives for the proposed anesthesia with the patient or authorized representative who has indicated his/her understanding and acceptance.   Dental Advisory Given  Plan Discussed with: Anesthesiologist, CRNA and Surgeon  Anesthesia Plan Comments:        Anesthesia Quick Evaluation

## 2014-11-27 ENCOUNTER — Ambulatory Visit: Payer: Medicare Other

## 2014-11-30 ENCOUNTER — Encounter: Payer: Self-pay | Admitting: Oncology

## 2014-11-30 ENCOUNTER — Ambulatory Visit: Payer: Medicare Other

## 2014-11-30 ENCOUNTER — Ambulatory Visit
Admission: RE | Admit: 2014-11-30 | Discharge: 2014-11-30 | Disposition: A | Discharge status: Home / Self Care | Payer: Medicare Other | Source: Ambulatory Visit | Attending: Radiation Oncology | Admitting: Radiation Oncology

## 2014-11-30 ENCOUNTER — Telehealth: Payer: Self-pay | Admitting: *Deleted

## 2014-11-30 DIAGNOSIS — Z510 Encounter for antineoplastic radiation therapy: Secondary | ICD-10-CM | POA: Diagnosis not present

## 2014-11-30 DIAGNOSIS — C541 Malignant neoplasm of endometrium: Secondary | ICD-10-CM

## 2014-11-30 MED ORDER — OXYCODONE-ACETAMINOPHEN 5-325 MG PO TABS: 1.0000 | ORAL_TABLET | Freq: Four times a day (QID) | ORAL | Status: DC | PRN

## 2014-11-30 NOTE — Progress Notes (Signed)
[place] @ [facility] [phone]  [phone]  Progress note  JENTRY MCQUEARY OB: 1929-11-17  MR#: [id number]  AOZ#:[id number]  Patient Care Team: Idelle Crouch, MD as PCP - General (Unknown Physician Specialty) [name], MD as Referring Physician (Surgery) [name], MD as Consulting Physician (Urology)  CHIEF COMPLAINT:  No chief complaint on file. 1/  VISIT DIAGNOSIS:     ICD-9-CM ICD-10-CM   1. Endometrial cancer (HCC) 182.0 C54.1 fentaNYL (DURAGESIC - DOSED MCG/HR) 25 MCG/HR patch     CBC with Differential     Comprehensive metabolic panel    Oncology History   1.  Carcinoma of left breast  IMPRESSION: Ultrasound-guided biopsy of a suspicious left breast mass at 9 o'clock. No apparent complications.   2.  Estrogen receptor positive.  Progesterone receptor positive.  HER-2 receptor 2+ by  I[facility] . Fish is pending  5 mm tumor clinically stage IB N0 M0 tumor   3.  Patient has a previous history of endometrium  carcinoma of endometrium stage IB disease status post bilateral salpingo-oophorectomy and radiation therapy 4.  Right hydronephrosis detected on MRI scan off lumbosacral spine.  Cystoscopy with attempted stent placement revealed bladder tumor in September of 2016 biopsies positive for recurrent endometrial cancer  5.  Patient had  stent placement in the right kidney Started radiation therapy  Oncology Flowsheet 04/08/2011 10/28/2014  dexamethasone (DECADRON) IJ - -  enoxaparin (LOVENOX) [place] 40 mg -  ondansetron (ZOFRAN) IV - -    INTERVAL HISTORY:  81 year old lady was getting regular mammogram done.  Patient had abnormal mammogram initial biopsies showed 5 mm tumor at 9:00 position in the left breast.  Biopsy of which is positive for invasive carcinoma patient was referred to me for preop treatment planning.  Patient had a previous history of carcinoma of endometrium.  Patient is not on any anti-hormonal therapy.  And does not  have any significant family history of breast cancer.  Patient and family is here to discuss results of the CT scan and further planning of treatment. Patient is scheduled for percutaneous nephrostomy on Friday.  Family is extremely concerned and confused about recurrence of endometrial cancer Pain in the right hip continues Patient had a stent placement.  Continues to have pain started on fentanyl patch which will be increased.  Continues to be very depressed because of increasing pain.  REVIEW OF SYSTEMS:   GENERAL:  Feels good.  Active.  No fevers, sweats or weight loss. PERFORMANCE STATUS (ECOG):  01 HEENT:  No visual changes, runny nose, sore throat, mouth sores or tenderness. Lungs: No shortness of breath or cough.  No hemoptysis. Cardiac:  No chest pain, palpitations, orthopnea, or PND. Right flank pain.  Right low back pain.  Right pain radiating down her right lower extremity.  Recent cystoscopy is consistent with bladder tumor.  Biopsies positive for endometrial cancer  Musculoskeletal:  No back pain.  No joint pain.  No muscle tenderness. Extremities:  No pain or swelling. Skin:  No rashes or skin changes. Neuro:  No headache, numbness or weakness, balance or coordination issues. Endocrine:  No diabetes, thyroid issues, hot flashes or night sweats. Psych:  No mood changes, depression or anxiety. Pain: Right hip area.  Persistent dull aching.  Patient is taking OxyContin which will be increased Review of systems:  All other systems reviewed and found to be negative.  As per HPI. Otherwise, a complete review of systems is negatve.  PAST MEDICAL HISTORY:  Past Medical History  Diagnosis Date  . History of esophageal stricture   . GERD (gastroesophageal reflux disease)   . Inguinal lymphocyst   . History of brachytherapy   . History of shingles   . History of colon polyps   . Mild aphasia   . Hearing loss   . History of DVT (deep vein thrombosis)   . Stroke ([place]) 04/06/2011      residual:  aphasia  . Hiccups   . Pain     SEVERE CHRONIC RIGHT LEG,HIP,FOOT  . Chronic kidney disease     HYDRONEPHROSIS  . Endometrial adenocarcinoma ([place]) 01/2012     stage Ib, grade 1, positive peritoneal cytology, no other risk factors or extra-uterine disease  . Cancer of left breast ([place]) 10/20/2014  . Bone cancer ([place])   . Arthritis     Degenerative Disc Disease  . Gastric ulcer     PAST SURGICAL HISTORY: Past Surgical History  Procedure Laterality Date  . Esophageal dilation    . Thyroidectomy, partial    . Cataract extraction w/ intraocular lens implant      right  . Back surgery      fusion of L3&4  . Tonsillectomy    . Total abdominal hysterectomy w/ bilateral salpingoophorectomy Bilateral     staging biopsies  . Breast cyst aspiration N/A   . Breast biopsy Left 10/13/2014    path pending  . Cystoscopy w/ retrogrades Right 10/28/2014    Procedure: CYSTOSCOPY WITH RETROGRADE PYELOGRAM ATTEMPT, UNABLE TO PASS ;  Surgeon: [name], MD;  Location: [facility] ORS;  Service: Urology;  Laterality: Right;  . Cystoscopy with stent placement Right 10/28/2014    Procedure: BLADDER BIOPSY WITH FULGERATION ;  Surgeon: [name], MD;  Location: [facility] OR[facility];  Service: Urology;  Laterality: Right;  . Thyroid removed    . Eye surgery Bilateral     Cataract Extraction  . Abdominal hysterectomy    . Portacath placement Right 11/26/2014    Procedure: INSERTION PORT-A-CATH;  Surgeon: [name], MD;  Location: [facility] ORS;  Service: General;  Laterality: Right;    FAMILY HISTORY Family History  Problem Relation Age of Onset  . Stroke Mother   . Breast cancer Maternal Aunt     x 2        ADVANCED DIRECTIVES:  Patient does not have any living will or healthcare power of attorney.  Information was given .  Available resources had been discussed.  We will follow-up on subsequent appointments regarding this issue  HEALTH MAINTENANCE: Social History  Substance  Use Topics  . Smoking status: Never Smoker   . Smokeless tobacco: Never Used  . Alcohol Use: No      Allergies  Allergen Reactions  . Penicillins Rash  . Hydrocodone-Acetaminophen Nausea Only  . Novocain [Procaine] Other (See Comments) and Nausea And Vomiting    Chest pain  . Sulfa Antibiotics Other (See Comments)    CHEST PAIN    Current Outpatient Prescriptions  Medication Sig Dispense Refill  . clopidogrel (PLAVIX) 75 MG tablet Take 1 tablet by mouth daily.    Marland Kitchen docusate sodium (COLACE) 100 MG capsule Take 100 mg by mouth 2 (two) times daily.    . fentaNYL (DURAGESIC - DOSED MCG/HR) 25 MCG/HR patch Place 1 patch (25 mcg total) onto the skin every 3 (three) days. 10 patch 0  . gabapentin (NEURONTIN) 300 MG capsule Take 1 capsule by mouth 2 (two) times daily.    Marland Kitchen  Multiple Vitamins-Minerals (MULTIVITAMIN WITH MINERALS) tablet Take 1 tablet by mouth daily.    Marland Kitchen oxyCODONE-acetaminophen (PERCOCET/ROXICET) 5-325 MG tablet Take 1-2 tablets by mouth every 6 (six) hours as needed. 60 tablet 0   No current facility-administered medications for this visit.    OBJECTIVE: PHYSICAL EXAM: GENERAL:  Well developed, well nourished, sitting comfortably in the exam room in no acute distress. MENTAL STATUS:  Alert and oriented to person, place and time.  ENT:  Oropharynx clear without lesion.  Tongue normal. Mucous membranes moist.  RESPIRATORY:  Clear to auscultation without rales, wheezes or rhonchi. CARDIOVASCULAR:  Regular rate and rhythm without murmur, rub or gallop. BREAST:  Right breast without masses, skin changes or nipple discharge.  .  Left breast inner and upper quadrant patient had ecchymosis from previous biopsy.  No palpable masses so no palpable lymphadenopathy ABDOMEN:  Soft, non-tender, with active bowel sounds, and no hepatosplenomegaly.  No masses. BACK:   CVA tenderness.  No tenderness on percussion of the back or rib cage. SKIN:  No rashes, ulcers or  lesions. EXTREMITIES: No edema, no skin discoloration or tenderness.  No palpable cords. LYMPH NODES: No palpable cervical, supraclavicular, axillary or inguinal adenopathy  NEUROLOGICAL: Unremarkable. PSYCH:  Appropriate.  Filed Vitals:   11/25/14 1159  BP: 123/75  Pulse: 75  Temp: 95.6 F (35.3 C)     Body mass index is 24.24 kg/(m^2).    ECOG FS:1 - Symptomatic but completely ambulatory  LAB RESULTS:  No recent lab at present time other than serum creatinine Biopsy report has been reviewed All lab data has been reviewed  STUDIES: Ct Chest W Contrast  11/09/2014  CLINICAL DATA:  Recurrent endometrial carcinoma. Newly diagnosed left breast carcinoma. Right hydronephrosis. EXAM: CT CHEST, ABDOMEN, AND PELVIS WITH CONTRAST TECHNIQUE: Multidetector CT imaging of the chest, abdomen and pelvis was performed following the standard protocol during bolus administration of intravenous contrast. CONTRAST:  55mL OMNIPAQUE IOHEXOL 300 MG/ML  SOLN COMPARISON:  AP CT on 04/04/2012 FINDINGS: CT CHEST FINDINGS Mediastinum/Lymph Nodes: No masses, pathologically enlarged lymph nodes, or other significant abnormality. Lungs/Pleura: No pulmonary mass, infiltrate, or effusion. Biapical and right lower lobe scarring noted. Musculoskeletal: No chest wall mass or suspicious bone lesions identified. CT ABDOMEN PELVIS FINDINGS Hepatobiliary: No masses or other significant abnormality. Pancreas: No mass, inflammatory changes, or other significant abnormality. Spleen: Within normal limits in size and appearance. Adrenals/Urinary Tract: No masses identified. Diffuse right renal parenchymal atrophy is seen with severe right hydroureteronephrosis which is new since previous study. Stomach/Bowel: No evidence of obstruction, inflammatory process, or abnormal fluid collections. Vascular/Lymphatic: Bilateral iliac lymphadenopathy is seen within the pelvis, right side greater than left. Largest area measures 2.3 cm on image 94  of series 2. 1.5 cm soft tissue nodule or lymphadenopathy is also seen in the area the sigmoid mesocolon on image 93/series 2. Reproductive: Prior hysterectomy noted. 1.5 cm soft tissue nodule is seen along the left vaginal cuff, suspicious for recurrent carcinoma. Other: None. Musculoskeletal: A lytic bone metastasis is seen involving the right ilium with associated soft tissue mass in the right iliopsoas region measuring 6.7 x 5.5 cm. IMPRESSION: Bilateral pelvic lymphadenopathy, right side greater than left, consistent with metastatic disease. Small soft tissue nodule involving the left vaginal cuff and 1.5 cm soft tissue nodule or lymphadenopathy in the sigmoid mesocolon, consistent with carcinoma. Right iliac bone metastasis with associated iliopsoas soft tissue mass. Severe right hydronephrosis and diffuse right renal parenchymal atrophy secondary to pelvic metastatic disease. No  metastatic disease identified within the abdomen or chest. Electronically Signed   By: Earle Gell M.D.   On: 11/09/2014 11:19   [place] Intraoperative  11/13/2014  CLINICAL DATA:  Ultrasound was provided for use by the ordering physician, and a technical charge was applied by the performing facility.  No radiologist interpretation/professional services rendered.   Ct Abdomen Pelvis W Contrast  11/09/2014  CLINICAL DATA:  Recurrent endometrial carcinoma. Newly diagnosed left breast carcinoma. Right hydronephrosis. EXAM: CT CHEST, ABDOMEN, AND PELVIS WITH CONTRAST TECHNIQUE: Multidetector CT imaging of the chest, abdomen and pelvis was performed following the standard protocol during bolus administration of intravenous contrast. CONTRAST:  54mL OMNIPAQUE IOHEXOL 300 MG/ML  SOLN COMPARISON:  AP CT on 04/04/2012 FINDINGS: CT CHEST FINDINGS Mediastinum/Lymph Nodes: No masses, pathologically enlarged lymph nodes, or other significant abnormality. Lungs/Pleura: No pulmonary mass, infiltrate, or effusion. Biapical and right lower lobe  scarring noted. Musculoskeletal: No chest wall mass or suspicious bone lesions identified. CT ABDOMEN PELVIS FINDINGS Hepatobiliary: No masses or other significant abnormality. Pancreas: No mass, inflammatory changes, or other significant abnormality. Spleen: Within normal limits in size and appearance. Adrenals/Urinary Tract: No masses identified. Diffuse right renal parenchymal atrophy is seen with severe right hydroureteronephrosis which is new since previous study. Stomach/Bowel: No evidence of obstruction, inflammatory process, or abnormal fluid collections. Vascular/Lymphatic: Bilateral iliac lymphadenopathy is seen within the pelvis, right side greater than left. Largest area measures 2.3 cm on image 94 of series 2. 1.5 cm soft tissue nodule or lymphadenopathy is also seen in the area the sigmoid mesocolon on image 93/series 2. Reproductive: Prior hysterectomy noted. 1.5 cm soft tissue nodule is seen along the left vaginal cuff, suspicious for recurrent carcinoma. Other: None. Musculoskeletal: A lytic bone metastasis is seen involving the right ilium with associated soft tissue mass in the right iliopsoas region measuring 6.7 x 5.5 cm. IMPRESSION: Bilateral pelvic lymphadenopathy, right side greater than left, consistent with metastatic disease. Small soft tissue nodule involving the left vaginal cuff and 1.5 cm soft tissue nodule or lymphadenopathy in the sigmoid mesocolon, consistent with carcinoma. Right iliac bone metastasis with associated iliopsoas soft tissue mass. Severe right hydronephrosis and diffuse right renal parenchymal atrophy secondary to pelvic metastatic disease. No metastatic disease identified within the abdomen or chest. Electronically Signed   By: Earle Gell M.D.   On: 11/09/2014 11:19   Dg Chest Port 1 View  11/26/2014  CLINICAL DATA:  Postop.  Central venous catheter EXAM: PORTABLE CHEST 1 VIEW COMPARISON:  11/09/2014 CT FINDINGS: New right IJ porta catheter, tip at the lower  SVC. No visible pneumothorax. No cardiomegaly or concerning mediastinal widening. No edema, effusion, or collapse. IMPRESSION: Right IJ porta catheter without complicating feature. Electronically Signed   By: Monte Fantasia M.D.   On: 11/26/2014 09:16   Dg C-arm 1-60 Min-no Report  11/26/2014  CLINICAL DATA: port a cath insertion C-ARM 1-60 MINUTES Fluoroscopy was utilized by the requesting physician.  No radiographic interpretation.   Ir Nephrostomy Placement Right  11/13/2014  CLINICAL DATA:  81 year old with metastatic endometrial cancer. Obstruction of the right ureter with severe right hydronephrosis. EXAM: PLACEMENT OF PERCUTANEOUS RIGHT NEPHROSTOMY TUBE WITH ULTRASOUND AND FLUOROSCOPIC GUIDANCE Physician: Stephan Minister. Henn, MD FLUOROSCOPY TIME:  1 minutes and 6 seconds. MEDICATIONS: Fentanyl.  Ciprofloxacin 400 mg ANESTHESIA/SEDATION: A radiology nurse monitored the patient for the procedure. PROCEDURE: The procedure was explained to the patient. The risks and benefits of the procedure were discussed and the patient's questions were addressed. Informed  consent was obtained from the patient. The patient was placed prone on the interventional table. Ultrasound was used to localize the right kidney. The right flank was prepped and draped in a sterile fashion. Maximal barrier sterile technique was utilized including caps, mask, sterile gowns, sterile gloves, sterile drape, hand hygiene and skin antiseptic. Skin and soft tissues were anesthetized with 1% lidocaine. A 22 gauge Chiba needle was directed into a dilated calyx with ultrasound guidance. Contrast injection confirmed placement in the collecting system. A 0.018 wire was advanced into the dilated renal pelvis. An Accustick dilator set was placed. The tract was dilated to accommodate a 10 [place] multipurpose drain. Approximately 150 mL of amber colored urine was removed. Catheter was sutured to skin and attached to gravity bag. Bandage placed at the  catheter site. FINDINGS: Severe right hydronephrosis. Needle directed into a dilated calyx. 150 mL of fluid was removed. Renal collecting system was decompressed at the end of the procedure. Estimated blood loss: Minimal COMPLICATIONS: None IMPRESSION: Successful right percutaneous nephrostomy tube placement with ultrasound and fluoroscopic guidance. PLAN: Patient is scheduled to return in 1 week for attempted placement of antegrade ureter stent. Electronically Signed   By: Markus Daft M.D.   On: 11/13/2014 12:57   Ir Ureteral Stent Right New Access W/sep Nephrostomy Cath  11/20/2014  CLINICAL DATA:  81 year old with metastatic endometrial cancer. Patient has a right ureter obstruction. Right nephrostomy tube was placed last week. Scheduled for attempted antegrade ureter stent placement. EXAM: PLACEMENT OF RIGHT ANTEGRADE URETER STENT; RIGHT NEPHROSTOGRAM THROUGH EXISTING CATHETER ; REMOVAL OF RIGHT NEPHROSTOMY TUBE Physician: Stephan Minister. Henn, MD FLUOROSCOPY TIME:  11 minutes and 42 seconds MEDICATIONS: 1.5 mg versed, 75 mcg fentanyl. A radiology nurse monitored the patient for moderate sedation. Ciprofloxacin 400 mg ANESTHESIA/SEDATION: Moderate sedation time: 24 minutes CONTRAST:  20 mL Omnipaque 300 PROCEDURE: The procedure was explained to the patient. The risks and benefits of the procedure were discussed and the patient's questions were addressed. Informed consent was obtained from the patient. The patient was placed prone on the interventional table. The right flank and existing catheter were prepped and draped in a sterile fashion. Maximal barrier sterile technique was utilized including caps, mask, sterile gowns, sterile gloves, sterile drape, hand hygiene and skin antiseptic. Contrast was injected through the catheter. The catheter was cut and removed over a Bentson wire. Five [place] Kumpe catheter was used to perform a right nephrostogram. Catheter was advanced into the mid right ureter and the right  ureter obstruction was identified. A Glidewire successfully crossed the obstruction and was advanced into the distal ureter. Contrast injection confirmed placement in the distal ureter. Catheter and wire was successfully advanced to the bladder and placement confirmed with a contrast injection. Stiff Amplatz wire was placed. Five French catheter was exchanged for a 7 [place] sheath. A Bentson wire was advanced as a safety wire. A 8 French x 24 cm Cook ureter stent was advanced over the Amplatz wire. The distal aspect was reconstituted in the urinary bladder. The proximal aspect was reconstituted in the renal pelvis. Contrast was injected to confirm patency of the ureter stent. The nephrostomy catheter was removed at the end of the procedure. Bandage placed at the old nephrostomy tube site. FINDINGS: Blood tinged urine in the right nephrostomy bag. Obstruction of the mid/distal right ureter at the level of the right lower SI joint. Wire was successfully advanced beyond the obstruction. Catheter placement confirmed in the urinary bladder with contrast injection. Placement of right  ureter stent. Proximal aspect of stent in the renal pelvis and distal aspect in the urinary bladder. Estimated blood loss: Minimal COMPLICATIONS: None IMPRESSION: Successful placement of right antegrade ureter stent through the existing catheter. Stent was successful placed across the obstruction in the mid/distal right ureter. Removal of the right nephrostomy tube. Electronically Signed   By: Markus Daft M.D.   On: 11/20/2014 14:48    ASSESSMENT:  Carcinoma of left breast at 9:00 position clinically stage is T1b N0 M0 tumor estrogen receptor positive progesterone receptor positive HER-2/neu receptor pending by fish Breast cancer surgery has been put on hold because of diagnosis of endometrial cancer  2.  Recurrent endometrial cancer CT scan has been reviewed shows hydronephrosis.  A big mass which is eroding into pelvic bone causing  significant pain There are bilateral enlarged lymph node Estrogen and progesterone receptors are pending Patient has been started on radiation therapy Will increase fentanyl patch.  We will reevaluate patient after radiation therapy Consider starting Megace

## 2014-11-30 NOTE — Telephone Encounter (Signed)
Informed that prescription is ready to pick up  

## 2014-12-01 ENCOUNTER — Ambulatory Visit
Admission: RE | Admit: 2014-12-01 | Discharge: 2014-12-01 | Disposition: A | Discharge status: Home / Self Care | Payer: Medicare Other | Source: Ambulatory Visit | Attending: Radiation Oncology | Admitting: Radiation Oncology

## 2014-12-01 DIAGNOSIS — Z510 Encounter for antineoplastic radiation therapy: Secondary | ICD-10-CM | POA: Diagnosis not present

## 2014-12-02 ENCOUNTER — Ambulatory Visit: Payer: Medicare Other | Admitting: Oncology

## 2014-12-02 ENCOUNTER — Institutional Professional Consult (permissible substitution): Payer: Medicare Other | Admitting: Radiation Oncology

## 2014-12-02 ENCOUNTER — Ambulatory Visit
Admission: RE | Admit: 2014-12-02 | Discharge: 2014-12-02 | Disposition: A | Discharge status: Home / Self Care | Payer: Medicare Other | Source: Ambulatory Visit | Attending: Radiation Oncology | Admitting: Radiation Oncology

## 2014-12-02 DIAGNOSIS — Z510 Encounter for antineoplastic radiation therapy: Secondary | ICD-10-CM | POA: Diagnosis not present

## 2014-12-03 ENCOUNTER — Inpatient Hospital Stay: Payer: Medicare Other

## 2014-12-03 ENCOUNTER — Ambulatory Visit
Admission: RE | Admit: 2014-12-03 | Discharge: 2014-12-03 | Disposition: A | Discharge status: Home / Self Care | Payer: Medicare Other | Source: Ambulatory Visit | Attending: Radiation Oncology | Admitting: Radiation Oncology

## 2014-12-03 ENCOUNTER — Ambulatory Visit: Payer: Medicare Other

## 2014-12-03 DIAGNOSIS — C541 Malignant neoplasm of endometrium: Secondary | ICD-10-CM

## 2014-12-03 DIAGNOSIS — Z510 Encounter for antineoplastic radiation therapy: Secondary | ICD-10-CM | POA: Diagnosis not present

## 2014-12-03 LAB — CBC
HCT: 40.8 % (ref 35.0–47.0)
Hemoglobin: 14 g/dL (ref 12.0–16.0)
MCH: 31.7 pg (ref 26.0–34.0)
MCHC: 34.3 g/dL (ref 32.0–36.0)
MCV: 92.2 fL (ref 80.0–100.0)
PLATELETS: 258 10*3/uL (ref 150–440)
RBC: 4.43 MIL/uL (ref 3.80–5.20)
RDW: 12.7 % (ref 11.5–14.5)
WBC: 5.8 10*3/uL (ref 3.6–11.0)

## 2014-12-04 ENCOUNTER — Ambulatory Visit
Admission: RE | Admit: 2014-12-04 | Discharge: 2014-12-04 | Disposition: A | Discharge status: Home / Self Care | Payer: Medicare Other | Source: Ambulatory Visit | Attending: Radiation Oncology | Admitting: Radiation Oncology

## 2014-12-04 DIAGNOSIS — Z510 Encounter for antineoplastic radiation therapy: Secondary | ICD-10-CM | POA: Diagnosis not present

## 2014-12-06 ENCOUNTER — Ambulatory Visit
Admission: RE | Admit: 2014-12-06 | Discharge: 2014-12-06 | Disposition: A | Discharge status: Home / Self Care | Payer: Medicare Other | Source: Ambulatory Visit | Attending: Radiation Oncology | Admitting: Radiation Oncology

## 2014-12-07 ENCOUNTER — Ambulatory Visit
Admission: RE | Admit: 2014-12-07 | Discharge: 2014-12-07 | Disposition: A | Discharge status: Home / Self Care | Payer: Medicare Other | Source: Ambulatory Visit | Attending: Radiation Oncology | Admitting: Radiation Oncology

## 2014-12-07 DIAGNOSIS — Z510 Encounter for antineoplastic radiation therapy: Secondary | ICD-10-CM | POA: Diagnosis not present

## 2014-12-08 ENCOUNTER — Ambulatory Visit
Admission: RE | Admit: 2014-12-08 | Discharge: 2014-12-08 | Disposition: A | Discharge status: Home / Self Care | Payer: Medicare Other | Source: Ambulatory Visit | Attending: Radiation Oncology | Admitting: Radiation Oncology

## 2014-12-08 ENCOUNTER — Telehealth: Payer: Self-pay | Admitting: *Deleted

## 2014-12-08 ENCOUNTER — Encounter: Payer: Self-pay | Admitting: *Deleted

## 2014-12-08 DIAGNOSIS — Z510 Encounter for antineoplastic radiation therapy: Secondary | ICD-10-CM | POA: Diagnosis not present

## 2014-12-08 DIAGNOSIS — C541 Malignant neoplasm of endometrium: Secondary | ICD-10-CM

## 2014-12-08 MED ORDER — FENTANYL 25 MCG/HR TD PT72
50.0000 ug | MEDICATED_PATCH | TRANSDERMAL | Status: DC
Start: 2014-12-08 — End: 2014-12-21

## 2014-12-08 MED ORDER — OXYCODONE HCL 10 MG PO TABS: 10.0000 mg | ORAL_TABLET | Freq: Four times a day (QID) | ORAL | Status: DC | PRN

## 2014-12-08 NOTE — Telephone Encounter (Signed)
Per Dr Oliva Bustard, increase Fentanyl to 50 mcg and change percocet to Oxycodone 10 mg # 60. I called and spoke with Her husband adn explained change in pain meds, he repeated back to me that she will wear 2 patches of 25 mcg and that she will only take 1 tab of Oxycodone and the tylenol has been removed

## 2014-12-09 ENCOUNTER — Ambulatory Visit
Admission: RE | Admit: 2014-12-09 | Discharge: 2014-12-09 | Disposition: A | Discharge status: Home / Self Care | Payer: Medicare Other | Source: Ambulatory Visit | Attending: Radiation Oncology | Admitting: Radiation Oncology

## 2014-12-09 DIAGNOSIS — Z510 Encounter for antineoplastic radiation therapy: Secondary | ICD-10-CM | POA: Diagnosis not present

## 2014-12-10 ENCOUNTER — Ambulatory Visit
Admission: RE | Admit: 2014-12-10 | Discharge: 2014-12-10 | Disposition: A | Discharge status: Home / Self Care | Payer: Medicare Other | Source: Ambulatory Visit | Attending: Radiation Oncology | Admitting: Radiation Oncology

## 2014-12-10 ENCOUNTER — Inpatient Hospital Stay: Payer: Medicare Other

## 2014-12-10 DIAGNOSIS — C50812 Malignant neoplasm of overlapping sites of left female breast: Secondary | ICD-10-CM | POA: Insufficient documentation

## 2014-12-10 DIAGNOSIS — R102 Pelvic and perineal pain: Secondary | ICD-10-CM | POA: Insufficient documentation

## 2014-12-10 DIAGNOSIS — R100 Acute abdomen: Secondary | ICD-10-CM | POA: Diagnosis not present

## 2014-12-10 DIAGNOSIS — M1990 Unspecified osteoarthritis, unspecified site: Secondary | ICD-10-CM | POA: Diagnosis not present

## 2014-12-10 DIAGNOSIS — G893 Neoplasm related pain (acute) (chronic): Secondary | ICD-10-CM | POA: Diagnosis not present

## 2014-12-10 DIAGNOSIS — N189 Chronic kidney disease, unspecified: Secondary | ICD-10-CM | POA: Insufficient documentation

## 2014-12-10 DIAGNOSIS — N1330 Unspecified hydronephrosis: Secondary | ICD-10-CM | POA: Insufficient documentation

## 2014-12-10 DIAGNOSIS — C7911 Secondary malignant neoplasm of bladder: Secondary | ICD-10-CM | POA: Insufficient documentation

## 2014-12-10 DIAGNOSIS — C541 Malignant neoplasm of endometrium: Secondary | ICD-10-CM

## 2014-12-10 DIAGNOSIS — M25551 Pain in right hip: Secondary | ICD-10-CM | POA: Diagnosis not present

## 2014-12-10 DIAGNOSIS — Z923 Personal history of irradiation: Secondary | ICD-10-CM | POA: Insufficient documentation

## 2014-12-10 DIAGNOSIS — C7951 Secondary malignant neoplasm of bone: Secondary | ICD-10-CM | POA: Diagnosis not present

## 2014-12-10 DIAGNOSIS — Z86718 Personal history of other venous thrombosis and embolism: Secondary | ICD-10-CM | POA: Diagnosis not present

## 2014-12-10 DIAGNOSIS — Z170 Estrogen receptor positive status [ER+]: Secondary | ICD-10-CM | POA: Insufficient documentation

## 2014-12-10 DIAGNOSIS — R5383 Other fatigue: Secondary | ICD-10-CM | POA: Diagnosis not present

## 2014-12-10 DIAGNOSIS — K219 Gastro-esophageal reflux disease without esophagitis: Secondary | ICD-10-CM | POA: Insufficient documentation

## 2014-12-10 DIAGNOSIS — Z90722 Acquired absence of ovaries, bilateral: Secondary | ICD-10-CM | POA: Insufficient documentation

## 2014-12-10 DIAGNOSIS — Z79899 Other long term (current) drug therapy: Secondary | ICD-10-CM | POA: Diagnosis not present

## 2014-12-10 DIAGNOSIS — Z8673 Personal history of transient ischemic attack (TIA), and cerebral infarction without residual deficits: Secondary | ICD-10-CM | POA: Diagnosis not present

## 2014-12-10 DIAGNOSIS — R531 Weakness: Secondary | ICD-10-CM | POA: Insufficient documentation

## 2014-12-10 DIAGNOSIS — Z803 Family history of malignant neoplasm of breast: Secondary | ICD-10-CM | POA: Diagnosis not present

## 2014-12-10 DIAGNOSIS — R5381 Other malaise: Secondary | ICD-10-CM | POA: Diagnosis not present

## 2014-12-10 DIAGNOSIS — R1031 Right lower quadrant pain: Secondary | ICD-10-CM | POA: Insufficient documentation

## 2014-12-10 DIAGNOSIS — M545 Low back pain: Secondary | ICD-10-CM | POA: Diagnosis not present

## 2014-12-10 DIAGNOSIS — Z510 Encounter for antineoplastic radiation therapy: Secondary | ICD-10-CM | POA: Diagnosis not present

## 2014-12-10 DIAGNOSIS — Z90710 Acquired absence of both cervix and uterus: Secondary | ICD-10-CM | POA: Insufficient documentation

## 2014-12-10 LAB — CBC
HCT: 39.7 % (ref 35.0–47.0)
HEMOGLOBIN: 13.4 g/dL (ref 12.0–16.0)
MCH: 31.6 pg (ref 26.0–34.0)
MCHC: 33.9 g/dL (ref 32.0–36.0)
MCV: 93.4 fL (ref 80.0–100.0)
PLATELETS: 214 10*3/uL (ref 150–440)
RBC: 4.25 MIL/uL (ref 3.80–5.20)
RDW: 12.5 % (ref 11.5–14.5)
WBC: 4.9 10*3/uL (ref 3.6–11.0)

## 2014-12-11 ENCOUNTER — Inpatient Hospital Stay: Payer: Medicare Other | Attending: Oncology | Admitting: Family Medicine

## 2014-12-11 ENCOUNTER — Ambulatory Visit
Admission: RE | Admit: 2014-12-11 | Discharge: 2014-12-11 | Disposition: A | Discharge status: Home / Self Care | Payer: Medicare Other | Source: Ambulatory Visit | Attending: Radiation Oncology | Admitting: Radiation Oncology

## 2014-12-11 ENCOUNTER — Inpatient Hospital Stay: Payer: Medicare Other

## 2014-12-11 VITALS — BP 116/66 | HR 75 | Temp 97.4°F | Resp 18 | Wt 120.0 lb

## 2014-12-11 DIAGNOSIS — R100 Acute abdomen: Secondary | ICD-10-CM

## 2014-12-11 DIAGNOSIS — M25551 Pain in right hip: Secondary | ICD-10-CM

## 2014-12-11 DIAGNOSIS — G893 Neoplasm related pain (acute) (chronic): Secondary | ICD-10-CM

## 2014-12-11 DIAGNOSIS — Z170 Estrogen receptor positive status [ER+]: Secondary | ICD-10-CM

## 2014-12-11 DIAGNOSIS — C541 Malignant neoplasm of endometrium: Secondary | ICD-10-CM | POA: Diagnosis not present

## 2014-12-11 DIAGNOSIS — M545 Low back pain: Secondary | ICD-10-CM

## 2014-12-11 DIAGNOSIS — Z90710 Acquired absence of both cervix and uterus: Secondary | ICD-10-CM

## 2014-12-11 DIAGNOSIS — C50812 Malignant neoplasm of overlapping sites of left female breast: Secondary | ICD-10-CM

## 2014-12-11 DIAGNOSIS — C7911 Secondary malignant neoplasm of bladder: Secondary | ICD-10-CM

## 2014-12-11 DIAGNOSIS — C7951 Secondary malignant neoplasm of bone: Secondary | ICD-10-CM | POA: Diagnosis not present

## 2014-12-11 DIAGNOSIS — Z510 Encounter for antineoplastic radiation therapy: Secondary | ICD-10-CM | POA: Diagnosis not present

## 2014-12-11 DIAGNOSIS — Z923 Personal history of irradiation: Secondary | ICD-10-CM

## 2014-12-11 DIAGNOSIS — Z90722 Acquired absence of ovaries, bilateral: Secondary | ICD-10-CM

## 2014-12-11 MED ORDER — OXYCODONE-ACETAMINOPHEN 5-325 MG PO TABS: 1.0000 | ORAL_TABLET | ORAL | Status: DC | PRN

## 2014-12-11 MED ORDER — KETOROLAC TROMETHAMINE 30 MG/ML IJ SOLN
30.0000 mg | Freq: Once | INTRAMUSCULAR | Status: AC
  Administered 2014-12-11: 30 mg via INTRAMUSCULAR
  Filled 2014-12-11: qty 1

## 2014-12-11 NOTE — Progress Notes (Signed)
[place]  [phone]) [phone]  Fax:(336) [phone]     Traci [name] DOB: November 27, 1929  MR#: [id number]  VOH#:[id number]  Patient Care Team: Idelle Crouch, MD as PCP - General (Unknown Physician Specialty) [name], MD as Referring Physician (Surgery) [name], MD as Consulting Physician (Urology)  CHIEF COMPLAINT:  Chief Complaint  Patient presents with  . Pain    right hip, radiating down right leg   Patient is here as an acute abdomen today from her radiation therapy for increasing pain after recent changes in her pain medications.  INTERVAL HISTORY:  Patient is here as an acute add-on regarding increasing pain in her lower back and right hip. She reports having been instructed by Dr. Oliva Bustard recently to change her Percocet pain medication to oxycodone 10 mg and increase her Duragesic patches to 50 g. Patient is very concerned and reports that since this change was made on Tuesday her pain has been increasingly worse. She is requesting to go back on her Percocet 5\325 mg as this is what relieved her pain in the past. Advised patient that the oxycodone 10 mg that she is currently taking should be stronger than the current dose of Percocet. She reports having 2 25 g patches on at this time and states pain is an 8 of 10.   REVIEW OF SYSTEMS:   Review of Systems  Constitutional: Positive for malaise/fatigue. Negative for fever, chills, weight loss and diaphoresis.  HENT: Negative for congestion, nosebleeds and sore throat.   Eyes: Negative for blurred vision, double vision, photophobia, pain, discharge and redness.  Respiratory: Negative for cough, hemoptysis, sputum production, shortness of breath, wheezing and stridor.   Cardiovascular: Negative for chest pain, palpitations, orthopnea, claudication, leg swelling and PND.  Gastrointestinal: Negative for heartburn, nausea, vomiting, abdominal pain, diarrhea, constipation, blood in stool and  melena.  Genitourinary: Negative.   Musculoskeletal: Positive for back pain.       Patient with severe lower back pain that radiates into the right hip and down the right leg. He also states that pain is beginning to increase and left hip.  Skin: Negative.   Neurological: Positive for weakness. Negative for dizziness, tingling, focal weakness and seizures.  Endo/Heme/Allergies: Does not bruise/bleed easily.  Psychiatric/Behavioral: Negative for depression. The patient is not nervous/anxious and does not have insomnia.     As per HPI. Otherwise, a complete review of systems is negatve.  ONCOLOGY HISTORY: Oncology History   1.  Carcinoma of left breast  IMPRESSION: Ultrasound-guided biopsy of a suspicious left breast mass at 9 o'clock. No apparent complications.     Cancer of left breast ([place])   10/20/2014 Initial Diagnosis Cancer of left breast    PAST MEDICAL HISTORY: Past Medical History  Diagnosis Date  . History of esophageal stricture   . GERD (gastroesophageal reflux disease)   . Inguinal lymphocyst   . History of brachytherapy   . History of shingles   . History of colon polyps   . Mild aphasia   . Hearing loss   . History of DVT (deep vein thrombosis)   . Stroke ([place]) 04/06/2011    residual:  aphasia  . Hiccups   . Pain     SEVERE CHRONIC RIGHT LEG,HIP,FOOT  . Chronic kidney disease     HYDRONEPHROSIS  . Endometrial adenocarcinoma ([place]) 01/2012     stage Ib, grade 1, positive peritoneal cytology, no other risk factors or extra-uterine disease  . Cancer of left  breast ([place]) 10/20/2014  . Bone cancer ([place])   . Arthritis     Degenerative Disc Disease  . Gastric ulcer     PAST SURGICAL HISTORY: Past Surgical History  Procedure Laterality Date  . Esophageal dilation    . Thyroidectomy, partial    . Cataract extraction w/ intraocular lens implant      right  . Back surgery      fusion of L3&4  . Tonsillectomy    . Total abdominal hysterectomy w/ bilateral  salpingoophorectomy Bilateral     staging biopsies  . Breast cyst aspiration N/A   . Breast biopsy Left 10/13/2014    path pending  . Cystoscopy w/ retrogrades Right 10/28/2014    Procedure: CYSTOSCOPY WITH RETROGRADE PYELOGRAM ATTEMPT, UNABLE TO PASS ;  Surgeon: [name], MD;  Location: [facility] ORS;  Service: Urology;  Laterality: Right;  . Cystoscopy with stent placement Right 10/28/2014    Procedure: BLADDER BIOPSY WITH FULGERATION ;  Surgeon: [name], MD;  Location: [facility] ORS;  Service: Urology;  Laterality: Right;  . Thyroid removed    . Eye surgery Bilateral     Cataract Extraction  . Abdominal hysterectomy    . Portacath placement Right 11/26/2014    Procedure: INSERTION PORT-A-CATH;  Surgeon: [name], MD;  Location: [facility] ORS;  Service: General;  Laterality: Right;    FAMILY HISTORY Family History  Problem Relation Age of Onset  . Stroke Mother   . Breast cancer Maternal Aunt     x 2    GYNECOLOGIC HISTORY:  No LMP recorded. Patient has had a hysterectomy.     ADVANCED DIRECTIVES:    HEALTH MAINTENANCE: Social History  Substance Use Topics  . Smoking status: Never Smoker   . Smokeless tobacco: Never Used  . Alcohol Use: No     Colonoscopy:  PAP:  Bone density:  Lipid panel:  Allergies  Allergen Reactions  . Penicillins Rash  . Hydrocodone-Acetaminophen Nausea Only  . Novocain [Procaine] Other (See Comments) and Nausea And Vomiting    Chest pain  . Sulfa Antibiotics Other (See Comments)    CHEST PAIN    Current Outpatient Prescriptions  Medication Sig Dispense Refill  . clopidogrel (PLAVIX) 75 MG tablet Take 1 tablet by mouth daily.    Marland Kitchen docusate sodium (COLACE) 100 MG capsule Take 100 mg by mouth 2 (two) times daily.    . fentaNYL (DURAGESIC - DOSED MCG/HR) 25 MCG/HR patch Place 2 patches (50 mcg total) onto the skin every 3 (three) days. 8 patch 0  . gabapentin (NEURONTIN) 300 MG capsule Take 1 capsule by mouth 2 (two) times  daily.    . Multiple Vitamins-Minerals (MULTIVITAMIN WITH MINERALS) tablet Take 1 tablet by mouth daily.    Marland Kitchen oxyCODONE-acetaminophen (PERCOCET/ROXICET) 5-325 MG tablet Take 1-2 tablets by mouth every 4 (four) hours as needed for severe pain. 45 tablet 0   No current facility-administered medications for this visit.    OBJECTIVE: BP 116/66 mmHg  Pulse 75  Temp(Src) 97.4 F (36.3 C) (Tympanic)  Resp 18  Wt 120 lb (54.432 kg)   Body mass index is 23.44 kg/(m^2).    ECOG FS:2 - Symptomatic, <50% confined to bed  General: Well-developed, well-nourished, sitting in wheelchair, appears uncomfortable.  Lungs: Clear to auscultation bilaterally. Heart: Regular rate and rhythm. No rubs, murmurs, or gallops. Abdomen: Soft, nontender, nondistended. No organomegaly noted, normoactive bowel sounds. Musculoskeletal: Pain in lower back radiating down right and left hips. No edema,  cyanosis, or clubbing. Neuro: Alert, answering all questions appropriately. Cranial nerves grossly intact. Skin: No rashes or petechiae noted. Psych: Depressed.   LAB RESULTS:  Appointment on 12/10/2014  Component Date Value Ref Range Status  . WBC 12/10/2014 4.9  3.6 - 11.0 K/uL Final  . RBC 12/10/2014 4.25  3.80 - 5.20 MIL/uL Final  . Hemoglobin 12/10/2014 13.4  12.0 - 16.0 g/dL Final  . HCT 12/10/2014 39.7  35.0 - 47.0 % Final  . MCV 12/10/2014 93.4  80.0 - 100.0 fL Final  . MCH 12/10/2014 31.6  26.0 - 34.0 pg Final  . MCHC 12/10/2014 33.9  32.0 - 36.0 g/dL Final  . RDW 12/10/2014 12.5  11.5 - 14.5 % Final  . Platelets 12/10/2014 214  150 - 440 K/uL Final    STUDIES: No results found.  ASSESSMENT:  Recurrent endometrial cancer. Recent diagnosis of left breast carcinoma, stage IB N0 M0.  PLAN:   1. Recurrent endometrial cancer. Mrs. recently CT scan noted to have a large mass eroding into the pelvic bone. Patient has been started on palliative radiation therapy. Most recently Dr. Oliva Bustard has increased  fentanyl patches to 50 g every 72 hours, he also changed Percocet 5/325mg  to oxycodone 10 mg but patient reports the pain is worse than before. At patient request she wants to go back to Percocet 5/325 mg, will refill this prescription for patient. Will also give patient 30 mg IM Toradol injection today in clinic. Patient to return on Monday for radiation therapy, and therefore will have her see Dr. Oliva Bustard for reevaluation of pain.   Patient expressed understanding and was in agreement with this plan. She also understands that She can call clinic at any time with any questions, concerns, or complaints.   Dr. Oliva Bustard was available for consultation and review of plan of care for this patient.  Cancer of left breast [facility])   Staging form: Breast, AJCC 7th Edition     Clinical: Stage IA (T1b, N0, M0) - Signed by Forest Gleason, MD on 10/20/2014   Evlyn Kanner, NP   12/11/2014 12:28 PM

## 2014-12-14 ENCOUNTER — Encounter: Payer: Self-pay | Admitting: Oncology

## 2014-12-14 ENCOUNTER — Inpatient Hospital Stay (HOSPITAL_BASED_OUTPATIENT_CLINIC_OR_DEPARTMENT_OTHER): Payer: Medicare Other | Admitting: Oncology

## 2014-12-14 ENCOUNTER — Ambulatory Visit
Admission: RE | Admit: 2014-12-14 | Discharge: 2014-12-14 | Disposition: A | Discharge status: Home / Self Care | Payer: Medicare Other | Source: Ambulatory Visit | Attending: Radiation Oncology | Admitting: Radiation Oncology

## 2014-12-14 VITALS — BP 113/70 | HR 72 | Temp 96.9°F | Wt 120.5 lb

## 2014-12-14 DIAGNOSIS — C50812 Malignant neoplasm of overlapping sites of left female breast: Secondary | ICD-10-CM

## 2014-12-14 DIAGNOSIS — C541 Malignant neoplasm of endometrium: Secondary | ICD-10-CM

## 2014-12-14 DIAGNOSIS — Z923 Personal history of irradiation: Secondary | ICD-10-CM

## 2014-12-14 DIAGNOSIS — C7951 Secondary malignant neoplasm of bone: Secondary | ICD-10-CM

## 2014-12-14 DIAGNOSIS — C7911 Secondary malignant neoplasm of bladder: Secondary | ICD-10-CM

## 2014-12-14 DIAGNOSIS — G893 Neoplasm related pain (acute) (chronic): Secondary | ICD-10-CM

## 2014-12-14 DIAGNOSIS — Z510 Encounter for antineoplastic radiation therapy: Secondary | ICD-10-CM | POA: Diagnosis not present

## 2014-12-14 DIAGNOSIS — Z90722 Acquired absence of ovaries, bilateral: Secondary | ICD-10-CM

## 2014-12-14 DIAGNOSIS — Z90710 Acquired absence of both cervix and uterus: Secondary | ICD-10-CM

## 2014-12-14 DIAGNOSIS — R102 Pelvic and perineal pain: Secondary | ICD-10-CM

## 2014-12-14 DIAGNOSIS — Z170 Estrogen receptor positive status [ER+]: Secondary | ICD-10-CM

## 2014-12-14 DIAGNOSIS — N1330 Unspecified hydronephrosis: Secondary | ICD-10-CM

## 2014-12-14 DIAGNOSIS — C50912 Malignant neoplasm of unspecified site of left female breast: Secondary | ICD-10-CM

## 2014-12-14 DIAGNOSIS — N189 Chronic kidney disease, unspecified: Secondary | ICD-10-CM

## 2014-12-14 MED ORDER — MELOXICAM 15 MG PO TABS: 15.0000 mg | ORAL_TABLET | Freq: Every day | ORAL | Status: DC

## 2014-12-14 MED ORDER — GABAPENTIN 300 MG PO CAPS: 300.0000 mg | ORAL_CAPSULE | Freq: Three times a day (TID) | ORAL | Status: DC

## 2014-12-14 NOTE — Progress Notes (Signed)
[place] @ [facility] Telephone:(336) [phone]  Fax:(336) [phone]  Progress note  Traci [name] OB: May 27, 1929  MR#: [id number]  ALP#:[id number]  Patient Care Team: Idelle Crouch, MD as PCP - General (Unknown Physician Specialty) [name], MD as Referring Physician (Surgery) [name], MD as Consulting Physician (Urology)  CHIEF COMPLAINT:  Chief Complaint  Patient presents with  . OTHER  1/  VISIT DIAGNOSIS:     ICD-9-CM ICD-10-CM   1. Endometrial cancer (HCC) 182.0 C54.1 meloxicam (MOBIC) 15 MG tablet     CBC with Differential     Comprehensive metabolic panel     gabapentin (NEURONTIN) 300 MG capsule     Magnesium     DISCONTINUED: gabapentin (NEURONTIN) 300 MG capsule  2. Malignant neoplasm of left female breast, unspecified site of breast (HCC) 174.9 C50.912 meloxicam (MOBIC) 15 MG tablet     CBC with Differential     Comprehensive metabolic panel     gabapentin (NEURONTIN) 300 MG capsule     Magnesium     DISCONTINUED: gabapentin (NEURONTIN) 300 MG capsule    Oncology History   1.  Carcinoma of left breast  IMPRESSION: Ultrasound-guided biopsy of a suspicious left breast mass at 9 o'clock. No apparent complications.   2.  Estrogen receptor positive.  Progesterone receptor positive.  HER-2 receptor 2+ by  IHC . Fish is pending  5 mm tumor clinically stage IB N0 M0 tumor   3.  Patient has a previous history of endometrium  carcinoma of endometrium stage IB disease status post bilateral salpingo-oophorectomy and radiation therapy 4.  Right hydronephrosis detected on MRI scan off lumbosacral spine.  Cystoscopy with attempted stent placement revealed bladder tumor in September of 2016 biopsies positive for recurrent endometrial cancer  5.  Patient had  stent placement in the right kidney Started radiation therapy  Oncology Flowsheet 04/08/2011 10/28/2014  dexamethasone (DECADRON) IJ - -  enoxaparin (LOVENOX) [place] 40 mg -  ondansetron (ZOFRAN) IV  - -    INTERVAL HISTORY:  81 year old lady came today further follow-up patient has 5 mm infiltrating ductal carcinoma of breast during the workup patient was found to have recurrent endometrial cancer with extensive retroperitoneal adenopathy and pelvic wall invasion and bladder invasion started on palliative radiation therapy   He is very difficult at present time to discuss pain control with the patient.  Patient has significant pain in the right pelvic area.  Not sure what pain medication to she takes According to her,  she  does not   Get relief from oxycodone and needs Percocet.  Patient is using fentanyl patch. Pain   is radiating down to right lower extremity     REVIEW OF SYSTEMS:   GENERAL:  Feels good.  Active.  No fevers, sweats or weight loss. PERFORMANCE STATUS (ECOG):  01 HEENT:  No visual changes, runny nose, sore throat, mouth sores or tenderness. Lungs: No shortness of breath or cough.  No hemoptysis. Cardiac:  No chest pain, palpitations, orthopnea, or PND. Right flank pain.  Right low back pain.  Right pain radiating down her right lower extremity.  Recent cystoscopy is consistent with bladder tumor.  Biopsies positive for endometrial cancer  Musculoskeletal:  No back pain.  No joint pain.  No muscle tenderness. Extremities:  No pain or swelling. Skin:  No rashes or skin changes. Neuro:  No headache, numbness or weakness, balance or coordination issues. Endocrine:  No diabetes, thyroid issues, hot flashes or night sweats. Psych:  No mood changes, depression or anxiety. Pain: Right hip area.  Persistent dull aching.   Review of systems:  All other systems reviewed and found to be negative.  As per HPI. Otherwise, a complete review of systems is negatve.  PAST MEDICAL HISTORY: Past Medical History  Diagnosis Date  . History of esophageal stricture   . GERD (gastroesophageal reflux disease)   . Inguinal lymphocyst   . History of brachytherapy   . History of  shingles   . History of colon polyps   . Mild aphasia   . Hearing loss   . History of DVT (deep vein thrombosis)   . Stroke ([place]) 04/06/2011    residual:  aphasia  . Hiccups   . Pain     SEVERE CHRONIC RIGHT LEG,HIP,FOOT  . Chronic kidney disease     HYDRONEPHROSIS  . Endometrial adenocarcinoma ([place]) 01/2012     stage Ib, grade 1, positive peritoneal cytology, no other risk factors or extra-uterine disease  . Cancer of left breast ([place]) 10/20/2014  . Bone cancer ([place])   . Arthritis     Degenerative Disc Disease  . Gastric ulcer     PAST SURGICAL HISTORY: Past Surgical History  Procedure Laterality Date  . Esophageal dilation    . Thyroidectomy, partial    . Cataract extraction w/ intraocular lens implant      right  . Back surgery      fusion of L3&4  . Tonsillectomy    . Total abdominal hysterectomy w/ bilateral salpingoophorectomy Bilateral     staging biopsies  . Breast cyst aspiration N/A   . Breast biopsy Left 10/13/2014    path pending  . Cystoscopy w/ retrogrades Right 10/28/2014    Procedure: CYSTOSCOPY WITH RETROGRADE PYELOGRAM ATTEMPT, UNABLE TO PASS ;  Surgeon: [name], MD;  Location: [facility] ORS;  Service: Urology;  Laterality: Right;  . Cystoscopy with stent placement Right 10/28/2014    Procedure: BLADDER BIOPSY WITH FULGERATION ;  Surgeon: [name], MD;  Location: [facility] ORS;  Service: Urology;  Laterality: Right;  . Thyroid removed    . Eye surgery Bilateral     Cataract Extraction  . Abdominal hysterectomy    . Portacath placement Right 11/26/2014    Procedure: INSERTION PORT-A-CATH;  Surgeon: [name], MD;  Location: [facility] ORS;  Service: General;  Laterality: Right;    FAMILY HISTORY Family History  Problem Relation Age of Onset  . Stroke Mother   . Breast cancer Maternal Aunt     x 2        ADVANCED DIRECTIVES:  Patient does not have any living will or healthcare power of attorney.  Information was given .  Available  resources had been discussed.  We will follow-up on subsequent appointments regarding this issue  HEALTH MAINTENANCE: Social History  Substance Use Topics  . Smoking status: Never Smoker   . Smokeless tobacco: Never Used  . Alcohol Use: No      Allergies  Allergen Reactions  . Penicillins Rash  . Hydrocodone-Acetaminophen Nausea Only  . Novocain [Procaine] Other (See Comments) and Nausea And Vomiting    Chest pain  . Sulfa Antibiotics Other (See Comments)    CHEST PAIN    Current Outpatient Prescriptions  Medication Sig Dispense Refill  . clopidogrel (PLAVIX) 75 MG tablet Take 1 tablet by mouth daily.    Marland Kitchen docusate sodium (COLACE) 100 MG capsule Take 100 mg by mouth 2 (two) times daily.    Marland Kitchen  fentaNYL (DURAGESIC - DOSED MCG/HR) 25 MCG/HR patch Place 2 patches (50 mcg total) onto the skin every 3 (three) days. 8 patch 0  . gabapentin (NEURONTIN) 300 MG capsule Take 1 capsule (300 mg total) by mouth 3 (three) times daily. 90 capsule 2  . Multiple Vitamins-Minerals (MULTIVITAMIN WITH MINERALS) tablet Take 1 tablet by mouth daily.    Marland Kitchen oxyCODONE-acetaminophen (PERCOCET/ROXICET) 5-325 MG tablet Take 1-2 tablets by mouth every 4 (four) hours as needed for severe pain. 45 tablet 0  . meloxicam (MOBIC) 15 MG tablet Take 1 tablet (15 mg total) by mouth daily. 30 tablet 0   No current facility-administered medications for this visit.    OBJECTIVE: PHYSICAL EXAM: GENERAL:  Well developed, well nourished, sitting comfortably in the exam room in no acute distress. MENTAL STATUS:  Alert and oriented to person, place and time.  ENT:  Oropharynx clear without lesion.  Tongue normal. Mucous membranes moist.  RESPIRATORY:  Clear to auscultation without rales, wheezes or rhonchi. CARDIOVASCULAR:  Regular rate and rhythm without murmur, rub or gallop.  ABDOMEN:  Soft, non-tender, with active bowel sounds, and no hepatosplenomegaly.  No masses. BACK:   CVA tenderness.  No tenderness on  percussion of the back or rib cage. SKIN:  No rashes, ulcers or lesions. EXTREMITIES: No edema, no skin discoloration or tenderness.  No palpable cords. LYMPH NODES: No palpable cervical, supraclavicular, axillary or inguinal adenopathy  NEUROLOGICAL: Unremarkable. PSYCH:  Appropriate.  Filed Vitals:   12/14/14 1157  BP: 113/70  Pulse: 72  Temp: 96.9 F (36.1 C)     Body mass index is 23.53 kg/(m^2).    ECOG FS:1 - Symptomatic but completely ambulatory  LAB RESULTS:  CBC Latest Ref Rng 12/17/2014 12/10/2014 12/03/2014  WBC 3.6 - 11.0 K/uL 5.9 4.9 5.8  Hemoglobin 12.0 - 16.0 g/dL 13.0 13.4 14.0  Hematocrit 35.0 - 47.0 % 37.9 39.7 40.8  Platelets 150 - 440 K/uL 225 214 258    STUDIES: Dg Chest Port 1 View  11/26/2014  CLINICAL DATA:  Postop.  Central venous catheter EXAM: PORTABLE CHEST 1 VIEW COMPARISON:  11/09/2014 CT FINDINGS: New right IJ porta catheter, tip at the lower SVC. No visible pneumothorax. No cardiomegaly or concerning mediastinal widening. No edema, effusion, or collapse. IMPRESSION: Right IJ porta catheter without complicating feature. Electronically Signed   By: Monte Fantasia M.D.   On: 11/26/2014 09:16   Dg C-arm 1-60 Min-no Report  11/26/2014  CLINICAL DATA: port a cath insertion C-ARM 1-60 MINUTES Fluoroscopy was utilized by the requesting physician.  No radiographic interpretation.   Ir Ureteral Stent Right New Access W/sep Nephrostomy Cath  11/20/2014  CLINICAL DATA:  81 year old with metastatic endometrial cancer. Patient has a right ureter obstruction. Right nephrostomy tube was placed last week. Scheduled for attempted antegrade ureter stent placement. EXAM: PLACEMENT OF RIGHT ANTEGRADE URETER STENT; RIGHT NEPHROSTOGRAM THROUGH EXISTING CATHETER ; REMOVAL OF RIGHT NEPHROSTOMY TUBE Physician: Stephan Minister. Henn, MD FLUOROSCOPY TIME:  11 minutes and 42 seconds MEDICATIONS: 1.5 mg versed, 75 mcg fentanyl. A radiology nurse monitored the patient for moderate  sedation. Ciprofloxacin 400 mg ANESTHESIA/SEDATION: Moderate sedation time: 24 minutes CONTRAST:  20 mL Omnipaque 300 PROCEDURE: The procedure was explained to the patient. The risks and benefits of the procedure were discussed and the patient's questions were addressed. Informed consent was obtained from the patient. The patient was placed prone on the interventional table. The right flank and existing catheter were prepped and draped in a sterile fashion. Maximal  barrier sterile technique was utilized including caps, mask, sterile gowns, sterile gloves, sterile drape, hand hygiene and skin antiseptic. Contrast was injected through the catheter. The catheter was cut and removed over a Bentson wire. Five [place] Kumpe catheter was used to perform a right nephrostogram. Catheter was advanced into the mid right ureter and the right ureter obstruction was identified. A Glidewire successfully crossed the obstruction and was advanced into the distal ureter. Contrast injection confirmed placement in the distal ureter. Catheter and wire was successfully advanced to the bladder and placement confirmed with a contrast injection. Stiff Amplatz wire was placed. Five French catheter was exchanged for a 7 [place] sheath. A Bentson wire was advanced as a safety wire. A 8 French x 24 cm Cook ureter stent was advanced over the Amplatz wire. The distal aspect was reconstituted in the urinary bladder. The proximal aspect was reconstituted in the renal pelvis. Contrast was injected to confirm patency of the ureter stent. The nephrostomy catheter was removed at the end of the procedure. Bandage placed at the old nephrostomy tube site. FINDINGS: Blood tinged urine in the right nephrostomy bag. Obstruction of the mid/distal right ureter at the level of the right lower SI joint. Wire was successfully advanced beyond the obstruction. Catheter placement confirmed in the urinary bladder with contrast injection. Placement of right ureter stent.  Proximal aspect of stent in the renal pelvis and distal aspect in the urinary bladder. Estimated blood loss: Minimal COMPLICATIONS: None IMPRESSION: Successful placement of right antegrade ureter stent through the existing catheter. Stent was successful placed across the obstruction in the mid/distal right ureter. Removal of the right nephrostomy tube. Electronically Signed   By: Markus Daft M.D.   On: 11/20/2014 14:48    ASSESSMENT:  Carcinoma of left breast at 9:00 position clinically stage is T1b N0 M0 tumor estrogen receptor positive progesterone receptor positive HER-2/neu receptor pending by fish Breast cancer surgery has been put on hold because of diagnosis of endometrial cancer   2.  Recurrent endometrial cancer Right hydronephrosis status post stent placement Patient is getting radiation therapy to the pelvic area Pain is still not under control.  We will add meloxicam.  Increase Neurontin.  And patient was given Percocet Reevaluation once radiation is finished add Megace after that Reevaluation with CT scan

## 2014-12-15 ENCOUNTER — Ambulatory Visit
Admission: RE | Admit: 2014-12-15 | Discharge: 2014-12-15 | Disposition: A | Discharge status: Home / Self Care | Payer: Medicare Other | Source: Ambulatory Visit | Attending: Radiation Oncology | Admitting: Radiation Oncology

## 2014-12-15 DIAGNOSIS — Z510 Encounter for antineoplastic radiation therapy: Secondary | ICD-10-CM | POA: Diagnosis not present

## 2014-12-16 ENCOUNTER — Ambulatory Visit
Admission: RE | Admit: 2014-12-16 | Discharge: 2014-12-16 | Disposition: A | Discharge status: Home / Self Care | Payer: Medicare Other | Source: Ambulatory Visit | Attending: Radiation Oncology | Admitting: Radiation Oncology

## 2014-12-16 DIAGNOSIS — Z510 Encounter for antineoplastic radiation therapy: Secondary | ICD-10-CM | POA: Diagnosis not present

## 2014-12-17 ENCOUNTER — Inpatient Hospital Stay: Payer: Medicare Other

## 2014-12-17 ENCOUNTER — Ambulatory Visit
Admission: RE | Admit: 2014-12-17 | Discharge: 2014-12-17 | Disposition: A | Discharge status: Home / Self Care | Payer: Medicare Other | Source: Ambulatory Visit | Attending: Radiation Oncology | Admitting: Radiation Oncology

## 2014-12-17 DIAGNOSIS — C541 Malignant neoplasm of endometrium: Secondary | ICD-10-CM

## 2014-12-17 DIAGNOSIS — Z510 Encounter for antineoplastic radiation therapy: Secondary | ICD-10-CM | POA: Diagnosis not present

## 2014-12-17 LAB — CBC
HEMATOCRIT: 37.9 % (ref 35.0–47.0)
HEMOGLOBIN: 13 g/dL (ref 12.0–16.0)
MCH: 31.5 pg (ref 26.0–34.0)
MCHC: 34.3 g/dL (ref 32.0–36.0)
MCV: 92 fL (ref 80.0–100.0)
Platelets: 225 10*3/uL (ref 150–440)
RBC: 4.12 MIL/uL (ref 3.80–5.20)
RDW: 12.6 % (ref 11.5–14.5)
WBC: 5.9 10*3/uL (ref 3.6–11.0)

## 2014-12-18 ENCOUNTER — Ambulatory Visit: Payer: Medicare Other

## 2014-12-20 ENCOUNTER — Encounter: Payer: Self-pay | Admitting: Oncology

## 2014-12-21 ENCOUNTER — Ambulatory Visit
Admission: RE | Admit: 2014-12-21 | Discharge: 2014-12-21 | Disposition: A | Discharge status: Home / Self Care | Payer: Medicare Other | Source: Ambulatory Visit | Attending: Radiation Oncology | Admitting: Radiation Oncology

## 2014-12-21 ENCOUNTER — Telehealth: Payer: Self-pay | Admitting: *Deleted

## 2014-12-21 DIAGNOSIS — Z510 Encounter for antineoplastic radiation therapy: Secondary | ICD-10-CM | POA: Diagnosis not present

## 2014-12-21 MED ORDER — OXYCODONE-ACETAMINOPHEN 5-325 MG PO TABS: 1.0000 | ORAL_TABLET | ORAL | Status: DC | PRN

## 2014-12-21 MED ORDER — FENTANYL 50 MCG/HR TD PT72: 50.0000 ug | MEDICATED_PATCH | TRANSDERMAL | Status: DC

## 2014-12-21 NOTE — Telephone Encounter (Signed)
Informed that prescription is ready to pick up  

## 2014-12-22 ENCOUNTER — Ambulatory Visit
Admission: RE | Admit: 2014-12-22 | Discharge: 2014-12-22 | Disposition: A | Discharge status: Home / Self Care | Payer: Medicare Other | Source: Ambulatory Visit | Attending: Radiation Oncology | Admitting: Radiation Oncology

## 2014-12-22 DIAGNOSIS — Z510 Encounter for antineoplastic radiation therapy: Secondary | ICD-10-CM | POA: Diagnosis not present

## 2014-12-23 ENCOUNTER — Ambulatory Visit
Admission: RE | Admit: 2014-12-23 | Discharge: 2014-12-23 | Disposition: A | Discharge status: Home / Self Care | Payer: Medicare Other | Source: Ambulatory Visit | Attending: Radiation Oncology | Admitting: Radiation Oncology

## 2014-12-23 DIAGNOSIS — Z510 Encounter for antineoplastic radiation therapy: Secondary | ICD-10-CM | POA: Diagnosis not present

## 2014-12-24 ENCOUNTER — Inpatient Hospital Stay: Payer: Medicare Other

## 2014-12-24 ENCOUNTER — Ambulatory Visit
Admission: RE | Admit: 2014-12-24 | Discharge: 2014-12-24 | Disposition: A | Discharge status: Home / Self Care | Payer: Medicare Other | Source: Ambulatory Visit | Attending: Radiation Oncology | Admitting: Radiation Oncology

## 2014-12-24 DIAGNOSIS — C541 Malignant neoplasm of endometrium: Secondary | ICD-10-CM | POA: Diagnosis not present

## 2014-12-24 DIAGNOSIS — Z510 Encounter for antineoplastic radiation therapy: Secondary | ICD-10-CM | POA: Diagnosis not present

## 2014-12-24 LAB — CBC
HEMATOCRIT: 40.9 % (ref 35.0–47.0)
HEMOGLOBIN: 13.9 g/dL (ref 12.0–16.0)
MCH: 31.4 pg (ref 26.0–34.0)
MCHC: 33.9 g/dL (ref 32.0–36.0)
MCV: 92.6 fL (ref 80.0–100.0)
Platelets: 265 10*3/uL (ref 150–440)
RBC: 4.42 MIL/uL (ref 3.80–5.20)
RDW: 12.8 % (ref 11.5–14.5)
WBC: 5.6 10*3/uL (ref 3.6–11.0)

## 2014-12-25 ENCOUNTER — Ambulatory Visit
Admission: RE | Admit: 2014-12-25 | Discharge: 2014-12-25 | Disposition: A | Discharge status: Home / Self Care | Payer: Medicare Other | Source: Ambulatory Visit | Attending: Radiation Oncology | Admitting: Radiation Oncology

## 2014-12-25 DIAGNOSIS — Z510 Encounter for antineoplastic radiation therapy: Secondary | ICD-10-CM | POA: Diagnosis not present

## 2014-12-28 ENCOUNTER — Ambulatory Visit
Admission: RE | Admit: 2014-12-28 | Discharge: 2014-12-28 | Disposition: A | Discharge status: Home / Self Care | Payer: Medicare Other | Source: Ambulatory Visit | Attending: Radiation Oncology | Admitting: Radiation Oncology

## 2014-12-28 DIAGNOSIS — Z510 Encounter for antineoplastic radiation therapy: Secondary | ICD-10-CM | POA: Diagnosis not present

## 2014-12-29 ENCOUNTER — Ambulatory Visit
Admission: RE | Admit: 2014-12-29 | Discharge: 2014-12-29 | Disposition: A | Discharge status: Home / Self Care | Payer: Medicare Other | Source: Ambulatory Visit | Attending: Radiation Oncology | Admitting: Radiation Oncology

## 2014-12-29 DIAGNOSIS — Z510 Encounter for antineoplastic radiation therapy: Secondary | ICD-10-CM | POA: Diagnosis not present

## 2014-12-30 ENCOUNTER — Ambulatory Visit
Admission: RE | Admit: 2014-12-30 | Discharge: 2014-12-30 | Disposition: A | Discharge status: Home / Self Care | Payer: Medicare Other | Source: Ambulatory Visit | Attending: Radiation Oncology | Admitting: Radiation Oncology

## 2014-12-30 ENCOUNTER — Ambulatory Visit: Admission: RE | Admit: 2014-12-30 | Payer: Medicare Other | Source: Ambulatory Visit

## 2014-12-30 ENCOUNTER — Ambulatory Visit: Payer: Medicare Other

## 2014-12-30 DIAGNOSIS — Z510 Encounter for antineoplastic radiation therapy: Secondary | ICD-10-CM | POA: Diagnosis not present

## 2014-12-31 ENCOUNTER — Ambulatory Visit: Payer: Medicare Other

## 2015-01-01 ENCOUNTER — Ambulatory Visit: Payer: Medicare Other

## 2015-01-04 ENCOUNTER — Encounter: Payer: Self-pay | Admitting: *Deleted

## 2015-01-04 ENCOUNTER — Ambulatory Visit
Admission: RE | Admit: 2015-01-04 | Discharge: 2015-01-04 | Disposition: A | Discharge status: Home / Self Care | Payer: Medicare Other | Source: Ambulatory Visit | Attending: Radiation Oncology | Admitting: Radiation Oncology

## 2015-01-04 ENCOUNTER — Encounter: Payer: Self-pay | Admitting: Oncology

## 2015-01-04 ENCOUNTER — Telehealth: Payer: Self-pay | Admitting: *Deleted

## 2015-01-04 ENCOUNTER — Inpatient Hospital Stay: Payer: Medicare Other

## 2015-01-04 ENCOUNTER — Inpatient Hospital Stay (HOSPITAL_BASED_OUTPATIENT_CLINIC_OR_DEPARTMENT_OTHER): Payer: Medicare Other | Admitting: Oncology

## 2015-01-04 VITALS — BP 116/69 | HR 70 | Temp 97.3°F | Wt 117.3 lb

## 2015-01-04 DIAGNOSIS — N1330 Unspecified hydronephrosis: Secondary | ICD-10-CM

## 2015-01-04 DIAGNOSIS — Z923 Personal history of irradiation: Secondary | ICD-10-CM

## 2015-01-04 DIAGNOSIS — Z90710 Acquired absence of both cervix and uterus: Secondary | ICD-10-CM

## 2015-01-04 DIAGNOSIS — C50912 Malignant neoplasm of unspecified site of left female breast: Secondary | ICD-10-CM

## 2015-01-04 DIAGNOSIS — C541 Malignant neoplasm of endometrium: Secondary | ICD-10-CM

## 2015-01-04 DIAGNOSIS — C7911 Secondary malignant neoplasm of bladder: Secondary | ICD-10-CM | POA: Diagnosis not present

## 2015-01-04 DIAGNOSIS — C50812 Malignant neoplasm of overlapping sites of left female breast: Secondary | ICD-10-CM

## 2015-01-04 DIAGNOSIS — Z510 Encounter for antineoplastic radiation therapy: Secondary | ICD-10-CM | POA: Diagnosis not present

## 2015-01-04 DIAGNOSIS — Z90722 Acquired absence of ovaries, bilateral: Secondary | ICD-10-CM

## 2015-01-04 DIAGNOSIS — C7951 Secondary malignant neoplasm of bone: Secondary | ICD-10-CM

## 2015-01-04 DIAGNOSIS — Z170 Estrogen receptor positive status [ER+]: Secondary | ICD-10-CM

## 2015-01-04 DIAGNOSIS — G893 Neoplasm related pain (acute) (chronic): Secondary | ICD-10-CM

## 2015-01-04 DIAGNOSIS — M545 Low back pain: Secondary | ICD-10-CM

## 2015-01-04 DIAGNOSIS — R1031 Right lower quadrant pain: Secondary | ICD-10-CM

## 2015-01-04 LAB — COMPREHENSIVE METABOLIC PANEL
ALK PHOS: 54 U/L (ref 38–126)
ALT: 9 U/L — ABNORMAL LOW (ref 14–54)
AST: 15 U/L (ref 15–41)
Albumin: 3.2 g/dL — ABNORMAL LOW (ref 3.5–5.0)
Anion gap: 8 (ref 5–15)
BILIRUBIN TOTAL: 0.5 mg/dL (ref 0.3–1.2)
BUN: 22 mg/dL — AB (ref 6–20)
CALCIUM: 8.6 mg/dL — AB (ref 8.9–10.3)
CO2: 28 mmol/L (ref 22–32)
Chloride: 100 mmol/L — ABNORMAL LOW (ref 101–111)
Creatinine, Ser: 1.11 mg/dL — ABNORMAL HIGH (ref 0.44–1.00)
GFR calc Af Amer: 52 mL/min — ABNORMAL LOW (ref >60)
GFR, EST NON AFRICAN AMERICAN: 45 mL/min — AB (ref >60)
Glucose, Bld: 121 mg/dL — ABNORMAL HIGH (ref 65–99)
POTASSIUM: 4.3 mmol/L (ref 3.5–5.1)
Sodium: 136 mmol/L (ref 135–145)
TOTAL PROTEIN: 5.9 g/dL — AB (ref 6.5–8.1)

## 2015-01-04 LAB — CBC WITH DIFFERENTIAL/PLATELET
Basophils Absolute: 0 10*3/uL (ref 0–0.1)
Basophils Relative: 1 %
EOS ABS: 0.6 10*3/uL (ref 0–0.7)
Eosinophils Relative: 12 %
HEMATOCRIT: 43.8 % (ref 35.0–47.0)
HEMOGLOBIN: 14.9 g/dL (ref 12.0–16.0)
LYMPHS ABS: 0.3 10*3/uL — AB (ref 1.0–3.6)
Lymphocytes Relative: 6 %
MCH: 31.5 pg (ref 26.0–34.0)
MCHC: 33.9 g/dL (ref 32.0–36.0)
MCV: 92.7 fL (ref 80.0–100.0)
MONOS PCT: 10 %
Monocytes Absolute: 0.5 10*3/uL (ref 0.2–0.9)
NEUTROS PCT: 71 %
Neutro Abs: 3.4 10*3/uL (ref 1.4–6.5)
Platelets: 299 10*3/uL (ref 150–440)
RBC: 4.73 MIL/uL (ref 3.80–5.20)
RDW: 12.8 % (ref 11.5–14.5)
WBC: 4.8 10*3/uL (ref 3.6–11.0)

## 2015-01-04 LAB — MAGNESIUM: MAGNESIUM: 2.1 mg/dL (ref 1.7–2.4)

## 2015-01-04 MED ORDER — MEGESTROL ACETATE 40 MG PO TABS: 40.0000 mg | ORAL_TABLET | Freq: Four times a day (QID) | ORAL | Status: DC

## 2015-01-04 NOTE — Telephone Encounter (Signed)
PA request has been submitted via [email or web address]. Awaiting response from [email or web address].

## 2015-01-04 NOTE — Progress Notes (Signed)
Traci [name] @ [facility] Telephone:([phone]) [phone]  [phone]  Progress note  Traci [name] OB: 12-Jun-1929  MR#: [id number]  XAJ#:[id number]  Patient Care Team: Idelle Crouch, MD as PCP - General (Unknown Physician Specialty) [name], MD as Referring Physician (Surgery) [name], MD as Consulting Physician (Urology)  CHIEF COMPLAINT:  Chief Complaint  Patient presents with  . Endometrial Cancer  1/  VISIT DIAGNOSIS:   No diagnosis found.  Oncology History   1.  Carcinoma of left breast  IMPRESSION: Ultrasound-guided biopsy of a suspicious left breast mass at 9 o'clock. No apparent complications.   2.  Estrogen receptor positive.  Progesterone receptor positive.  HER-2 receptor 2+ by  IHC . Fish is pending  5 mm tumor clinically stage IB N0 M0 tumor   3.  Patient has a previous history of endometrium  carcinoma of endometrium stage IB disease status post bilateral salpingo-oophorectomy and radiation therapy 4.  Right hydronephrosis detected on MRI scan off lumbosacral spine.  Cystoscopy with attempted stent placement revealed bladder tumor in September of 2016 biopsies positive for recurrent endometrial cancer  5.  Patient had  stent placement in the right kidney Started radiation therapy 6.  Patient has finished radiation therapy with relief in the pain (November, 2016)  Oncology Flowsheet 04/08/2011 10/28/2014  dexamethasone (DECADRON) IJ - -  enoxaparin (LOVENOX) Am[place]ita 40 mg -  ondansetron (ZOFRAN) IV - -    INTERVAL HISTORY:  81 year old lady came today further follow-up patient has 5 mm infiltrating ductal carcinoma of breast during the workup patient was found to have recurrent endometrial cancer with extensive retroperitoneal adenopathy and pelvic wall invasion and bladder invasion started on palliative radiation therapy  Patient has finished radiation therapy.  There is significant relief in the pain. Patient is here for ongoing evaluation  and treatment consideration      REVIEW OF SYSTEMS:   GENERAL:  Feels good.  Active.  No fevers, sweats or weight loss. PERFORMANCE STATUS (ECOG):  01 HEENT:  No visual changes, runny nose, sore throat, mouth sores or tenderness. Lungs: No shortness of breath or cough.  No hemoptysis. Cardiac:  No chest pain, palpitations, orthopnea, or PND. Right flank pain.  Right low back pain.  Right pain radiating down her right lower extremity.  Recent cystoscopy is consistent with bladder tumor.  Biopsies positive for endometrial cancer  Musculoskeletal:  No back pain.  No joint pain.  No muscle tenderness. Extremities:  No pain or swelling. Skin:  No rashes or skin changes. Neuro:  No headache, numbness or weakness, balance or coordination issues. Endocrine:  No diabetes, thyroid issues, hot flashes or night sweats. Psych:  No mood changes, depression or anxiety. Pain: Significant relief in the pain Will try to taper off Patient from pain medication, monitoring pain  Review of systems:  All other systems reviewed and found to be negative.  As per HPI. Otherwise, a complete review of systems is negatve.  PAST MEDICAL HISTORY: Past Medical History  Diagnosis Date  . History of esophageal stricture   . GERD (gastroesophageal reflux disease)   . Inguinal lymphocyst   . History of brachytherapy   . History of shingles   . History of colon polyps   . Mild aphasia   . Hearing loss   . History of DVT (deep vein thrombosis)   . Stroke ([place]) 04/06/2011    residual:  aphasia  . Hiccups   . Pain     SEVERE CHRONIC RIGHT  LEG,HIP,FOOT  . Chronic kidney disease     HYDRONEPHROSIS  . Endometrial adenocarcinoma ([place]) 01/2012     stage Ib, grade 1, positive peritoneal cytology, no other risk factors or extra-uterine disease  . Cancer of left breast ([place]) 10/20/2014  . Bone cancer ([place])   . Arthritis     Degenerative Disc Disease  . Gastric ulcer     PAST SURGICAL HISTORY: Past Surgical  History  Procedure Laterality Date  . Esophageal dilation    . Thyroidectomy, partial    . Cataract extraction w/ intraocular lens implant      right  . Back surgery      fusion of L3&4  . Tonsillectomy    . Total abdominal hysterectomy w/ bilateral salpingoophorectomy Bilateral     staging biopsies  . Breast cyst aspiration N/A   . Breast biopsy Left 10/13/2014    path pending  . Cystoscopy w/ retrogrades Right 10/28/2014    Procedure: CYSTOSCOPY WITH RETROGRADE PYELOGRAM ATTEMPT, UNABLE TO PASS ;  Surgeon: [name], MD;  Location: [facility] ORS;  Service: Urology;  Laterality: Right;  . Cystoscopy with stent placement Right 10/28/2014    Procedure: BLADDER BIOPSY WITH FULGERATION ;  Surgeon: [name], MD;  Location: [facility] ORS;  Service: Urology;  Laterality: Right;  . Thyroid removed    . Eye surgery Bilateral     Cataract Extraction  . Abdominal hysterectomy    . Portacath placement Right 11/26/2014    Procedure: INSERTION PORT-A-CATH;  Surgeon: [name], MD;  Location: [facility] ORS;  Service: General;  Laterality: Right;    FAMILY HISTORY Family History  Problem Relation Age of Onset  . Stroke Mother   . Breast cancer Maternal Aunt     x 2        ADVANCED DIRECTIVES:  Patient does not have any living will or healthcare power of attorney.  Information was given .  Available resources had been discussed.  We will follow-up on subsequent appointments regarding this issue  HEALTH MAINTENANCE: Social History  Substance Use Topics  . Smoking status: Never Smoker   . Smokeless tobacco: Never Used  . Alcohol Use: No      Allergies  Allergen Reactions  . Penicillins Rash  . Hydrocodone-Acetaminophen Nausea Only  . Novocain [Procaine] Other (See Comments) and Nausea And Vomiting    Chest pain  . Sulfa Antibiotics Other (See Comments)    CHEST PAIN    Current Outpatient Prescriptions  Medication Sig Dispense Refill  . clopidogrel (PLAVIX) 75 MG  tablet Take 1 tablet by mouth daily.    Marland Kitchen docusate sodium (COLACE) 100 MG capsule Take 100 mg by mouth 2 (two) times daily.    . fentaNYL (DURAGESIC - DOSED MCG/HR) 50 MCG/HR Place 1 patch (50 mcg total) onto the skin every 3 (three) days. 10 patch 0  . gabapentin (NEURONTIN) 300 MG capsule Take 1 capsule (300 mg total) by mouth 3 (three) times daily. 90 capsule 2  . meloxicam (MOBIC) 15 MG tablet Take 1 tablet (15 mg total) by mouth daily. 30 tablet 0  . Multiple Vitamins-Minerals (MULTIVITAMIN WITH MINERALS) tablet Take 1 tablet by mouth daily.    Marland Kitchen oxyCODONE-acetaminophen (PERCOCET/ROXICET) 5-325 MG tablet Take 1-2 tablets by mouth every 4 (four) hours as needed for severe pain. 90 tablet 0   No current facility-administered medications for this visit.    OBJECTIVE: PHYSICAL EXAM: GENERAL:  Well developed, well nourished, sitting comfortably in the exam room in  no acute distress. MENTAL STATUS:  Alert and oriented to person, place and time.  ENT:  Oropharynx clear without lesion.  Tongue normal. Mucous membranes moist.  RESPIRATORY:  Clear to auscultation without rales, wheezes or rhonchi. CARDIOVASCULAR:  Regular rate and rhythm without murmur, rub or gallop.  ABDOMEN:  Soft, non-tender, with active bowel sounds, and no hepatosplenomegaly.  No masses. BACK:   CVA tenderness.  No tenderness on percussion of the back or rib cage. SKIN:  No rashes, ulcers or lesions. EXTREMITIES: No edema, no skin discoloration or tenderness.  No palpable cords. LYMPH NODES: No palpable cervical, supraclavicular, axillary or inguinal adenopathy  NEUROLOGICAL: Unremarkable. PSYCH:  Appropriate.  Filed Vitals:   01/04/15 1206  BP: 116/69  Pulse: 70  Temp: 97.3 F (36.3 C)     Body mass index is 22.91 kg/(m^2).    ECOG FS:1 - Symptomatic but completely ambulatory  LAB RESULTS:  CBC Latest Ref Rng 01/04/2015 12/24/2014 12/17/2014  WBC 3.6 - 11.0 K/uL 4.8 5.6 5.9  Hemoglobin 12.0 - 16.0 g/dL 14.9  13.9 13.0  Hematocrit 35.0 - 47.0 % 43.8 40.9 37.9  Platelets 150 - 440 K/uL 299 265 225    STUDIES: No results found.  ASSESSMENT:  Carcinoma of left breast at 9:00 position clinically stage is T1b N0 M0 tumor estrogen receptor positive progesterone receptor positive HER-2/neu receptor pending by fish Breast cancer surgery has been put on hold because of diagnosis of endometrial cancer   2.  Recurrent endometrial cancer Right hydronephrosis status post stent placement Pain has improved.  We will try to decrease fentanyl patch Hydronephrosis is being followed by urologist.  Change in the stent would be considered.  Repeat CT scan has been scheduled.  For further management of found endometrial cancer Megace has been started 40 mg 4 times a day.  If not tolerated letrozole can be tried  After CT scan patient will be reevaluated to see response as well as need for further chemotherapy and also management of breast cancer which has been put on hold because of metastatic endometrial cancer

## 2015-01-04 NOTE — Telephone Encounter (Signed)
Need PA on Megace

## 2015-01-04 NOTE — Progress Notes (Signed)
  Oncology Nurse Navigator Documentation    Navigator Encounter Type: Clinic/MDC (01/04/15 1200) Patient Visit Type: Follow-up (01/04/15 1200)   Barriers/Navigation Needs: No barriers at this time (01/04/15 1200)  Met patient and her husband today while waiting for her medical oncology appointment.  States her pain is relieved.  States she is taking her pain meds around the clock, and as long as she does that, she is pain free.  Encouraged to continue with her routine.  No needs at this time.              Time Spent with Patient: 15 (01/04/15 1200)

## 2015-01-04 NOTE — Progress Notes (Signed)
Patient states she had some nausea and diarrhea last week.  No pain.  Getting her last radiation treatment on Thursday.

## 2015-01-05 ENCOUNTER — Ambulatory Visit
Admission: RE | Admit: 2015-01-05 | Discharge: 2015-01-05 | Disposition: A | Discharge status: Home / Self Care | Payer: Medicare Other | Source: Ambulatory Visit | Attending: Radiation Oncology | Admitting: Radiation Oncology

## 2015-01-05 ENCOUNTER — Ambulatory Visit: Payer: Medicare Other

## 2015-01-05 ENCOUNTER — Encounter: Payer: Self-pay | Admitting: *Deleted

## 2015-01-05 DIAGNOSIS — Z510 Encounter for antineoplastic radiation therapy: Secondary | ICD-10-CM | POA: Diagnosis not present

## 2015-01-05 NOTE — Progress Notes (Signed)
Megace has been denied by insurance. Will send appeal letter to get medication approved. Awaiting response from insurance.

## 2015-01-06 ENCOUNTER — Other Ambulatory Visit: Payer: Medicare Other

## 2015-01-06 ENCOUNTER — Ambulatory Visit
Admission: RE | Admit: 2015-01-06 | Discharge: 2015-01-06 | Disposition: A | Discharge status: Home / Self Care | Payer: Medicare Other | Source: Ambulatory Visit | Attending: Radiation Oncology | Admitting: Radiation Oncology

## 2015-01-06 ENCOUNTER — Ambulatory Visit: Payer: Medicare Other | Admitting: Oncology

## 2015-01-06 ENCOUNTER — Ambulatory Visit: Payer: Medicare Other

## 2015-01-06 DIAGNOSIS — Z510 Encounter for antineoplastic radiation therapy: Secondary | ICD-10-CM | POA: Diagnosis not present

## 2015-01-07 ENCOUNTER — Telehealth: Payer: Self-pay | Admitting: *Deleted

## 2015-01-07 DIAGNOSIS — C50912 Malignant neoplasm of unspecified site of left female breast: Secondary | ICD-10-CM

## 2015-01-07 DIAGNOSIS — C541 Malignant neoplasm of endometrium: Secondary | ICD-10-CM

## 2015-01-07 MED ORDER — MELOXICAM 15 MG PO TABS: 15.0000 mg | ORAL_TABLET | Freq: Every day | ORAL | Status: DC

## 2015-01-07 NOTE — Telephone Encounter (Signed)
Escribed

## 2015-01-09 ENCOUNTER — Encounter: Payer: Self-pay | Admitting: Oncology

## 2015-01-11 ENCOUNTER — Telehealth: Payer: Self-pay | Admitting: *Deleted

## 2015-01-11 MED ORDER — OXYCODONE-ACETAMINOPHEN 5-325 MG PO TABS: 1.0000 | ORAL_TABLET | ORAL | Status: DC | PRN

## 2015-01-11 NOTE — Telephone Encounter (Signed)
Informed that prescription is ready to pick up  

## 2015-01-25 ENCOUNTER — Telehealth: Payer: Self-pay | Admitting: *Deleted

## 2015-01-25 MED ORDER — FENTANYL 50 MCG/HR TD PT72: 50.0000 ug | MEDICATED_PATCH | TRANSDERMAL | Status: DC

## 2015-01-25 NOTE — Telephone Encounter (Signed)
Informed that prescription is ready to pick up  

## 2015-02-02 ENCOUNTER — Other Ambulatory Visit: Payer: Self-pay | Admitting: *Deleted

## 2015-02-02 DIAGNOSIS — C541 Malignant neoplasm of endometrium: Secondary | ICD-10-CM

## 2015-02-02 DIAGNOSIS — C50912 Malignant neoplasm of unspecified site of left female breast: Secondary | ICD-10-CM

## 2015-02-02 MED ORDER — OXYCODONE-ACETAMINOPHEN 5-325 MG PO TABS: 1.0000 | ORAL_TABLET | ORAL | Status: DC | PRN

## 2015-02-02 MED ORDER — GABAPENTIN 300 MG PO CAPS: 300.0000 mg | ORAL_CAPSULE | Freq: Three times a day (TID) | ORAL | Status: DC

## 2015-02-02 NOTE — Telephone Encounter (Signed)
[name] patient 

## 2015-02-03 ENCOUNTER — Ambulatory Visit: Payer: Medicare Other

## 2015-02-04 ENCOUNTER — Telehealth: Payer: Self-pay | Admitting: *Deleted

## 2015-02-04 ENCOUNTER — Ambulatory Visit
Admission: RE | Admit: 2015-02-04 | Discharge: 2015-02-04 | Disposition: A | Discharge status: Home / Self Care | Payer: Medicare Other | Source: Ambulatory Visit | Attending: Oncology | Admitting: Oncology

## 2015-02-04 DIAGNOSIS — C541 Malignant neoplasm of endometrium: Secondary | ICD-10-CM

## 2015-02-04 DIAGNOSIS — K5730 Diverticulosis of large intestine without perforation or abscess without bleeding: Secondary | ICD-10-CM | POA: Insufficient documentation

## 2015-02-04 DIAGNOSIS — C50912 Malignant neoplasm of unspecified site of left female breast: Secondary | ICD-10-CM

## 2015-02-04 DIAGNOSIS — C7951 Secondary malignant neoplasm of bone: Secondary | ICD-10-CM | POA: Insufficient documentation

## 2015-02-04 MED ORDER — IOHEXOL 300 MG/ML  SOLN
100.0000 mL | Freq: Once | INTRAMUSCULAR | Status: AC | PRN
  Administered 2015-02-04: 80 mL via INTRAVENOUS

## 2015-02-04 MED ORDER — MELOXICAM 15 MG PO TABS: 15.0000 mg | ORAL_TABLET | Freq: Every day | ORAL | Status: DC

## 2015-02-04 NOTE — Telephone Encounter (Signed)
Meloxicam e scribed and pt was left VM that her megace has 3 refills on it

## 2015-02-05 ENCOUNTER — Encounter: Payer: Self-pay | Admitting: Urology

## 2015-02-05 ENCOUNTER — Ambulatory Visit (INDEPENDENT_AMBULATORY_CARE_PROVIDER_SITE_OTHER): Payer: Medicare Other | Admitting: Urology

## 2015-02-05 VITALS — BP 105/70 | HR 67 | Ht 64.0 in | Wt 122.5 lb

## 2015-02-05 DIAGNOSIS — N131 Hydronephrosis with ureteral stricture, not elsewhere classified: Secondary | ICD-10-CM | POA: Diagnosis not present

## 2015-02-05 NOTE — Progress Notes (Signed)
02/05/2015 10:17 AM   Traci [name] Valeta Harms August 31, 1929 [id number]  Referring provider: Idelle Crouch, MD [facility] [place], [place] [address]  Chief Complaint  Patient presents with  . Follow-up    Endometrial carcinoma invasion of the bladder    HPI: The patient is a 81 year old female with past history significant for cervical cancer found to have severe right hydroureteronephrosis found incidentally on a spinal MRI. The patient also reports pain that radiates from her back down the backside of her leg. Genitourinary symptoms that she is being treated for which he leads a UTI with Cipro at this time. She also has breast cancer, and her surgeon would like for her hydronephrosis corrected prior to this. Of note she is on aspirin and Plavix. She she underwent a total abdominal hysterectomy for cervical cancer 3 years ago. Of note she did receive radiation that time.  Interval History: The patient underwent an attempted right ureteral stent placement that was unsuccessful. At that time a small mass was seen in the bladder which was biopsied. This came back as endometrial carcinoma.  December 2016 Interval History: She was found to have a large mass invading her right iliac bone on CT scan. Intervention radiology was able to place an internalized antegrade ureteral stent. It is due to be changed in late January 2017.  In terms of her cancer therapy, she is undergone radiation. She faces 4 weeks ago. She has an appointment with Dr. Oliva Bustard next week to discuss the next step.   PMH: Past Medical History  Diagnosis Date  . History of esophageal stricture   . GERD (gastroesophageal reflux disease)   . Inguinal lymphocyst   . History of brachytherapy   . History of shingles   . History of colon polyps   . Mild aphasia   . Hearing loss   . History of DVT (deep vein thrombosis)   . Stroke ([place]) 04/06/2011    residual:  aphasia  . Hiccups   . Pain     SEVERE  CHRONIC RIGHT LEG,HIP,FOOT  . Chronic kidney disease     HYDRONEPHROSIS  . Endometrial adenocarcinoma ([place]) 01/2012     stage Ib, grade 1, positive peritoneal cytology, no other risk factors or extra-uterine disease  . Cancer of left breast ([place]) 10/20/2014  . Bone cancer ([place])   . Arthritis     Degenerative Disc Disease  . Gastric ulcer     Surgical History: Past Surgical History  Procedure Laterality Date  . Esophageal dilation    . Thyroidectomy, partial    . Cataract extraction w/ intraocular lens implant      right  . Back surgery      fusion of L3&4  . Tonsillectomy    . Total abdominal hysterectomy w/ bilateral salpingoophorectomy Bilateral     staging biopsies  . Breast cyst aspiration N/A   . Breast biopsy Left 10/13/2014    path pending  . Cystoscopy w/ retrogrades Right 10/28/2014    Procedure: CYSTOSCOPY WITH RETROGRADE PYELOGRAM ATTEMPT, UNABLE TO PASS ;  Surgeon: [name], MD;  Location: [facility] ORS;  Service: Urology;  Laterality: Right;  . Cystoscopy with stent placement Right 10/28/2014    Procedure: BLADDER BIOPSY WITH FULGERATION ;  Surgeon: [name], MD;  Location: [facility] ORS;  Service: Urology;  Laterality: Right;  . Thyroid removed    . Eye surgery Bilateral     Cataract Extraction  . Abdominal hysterectomy    .  Portacath placement Right 11/26/2014    Procedure: INSERTION PORT-A-CATH;  Surgeon: [name], MD;  Location: [facility] ORS;  Service: General;  Laterality: Right;    Home Medications:    Medication List       This list is accurate as of: 02/05/15 10:17 AM.  Always use your most recent med list.               clopidogrel 75 MG tablet  Commonly known as:  PLAVIX  Take 1 tablet by mouth daily.     docusate sodium 100 MG capsule  Commonly known as:  COLACE  Take 100 mg by mouth 2 (two) times daily.     fentaNYL 50 MCG/HR  Commonly known as:  DURAGESIC - dosed mcg/hr  Place 1 patch (50 mcg total) onto the skin every  3 (three) days.     gabapentin 300 MG capsule  Commonly known as:  NEURONTIN  Take 1 capsule (300 mg total) by mouth 3 (three) times daily.     gentamicin ointment 0.1 %  Commonly known as:  GARAMYCIN     megestrol 40 MG tablet  Commonly known as:  MEGACE  Take 1 tablet (40 mg total) by mouth 4 (four) times daily.     meloxicam 15 MG tablet  Commonly known as:  MOBIC  Take 1 tablet (15 mg total) by mouth daily.     multivitamin with minerals tablet  Take 1 tablet by mouth daily.     oxyCODONE-acetaminophen 5-325 MG tablet  Commonly known as:  PERCOCET/ROXICET  Take 1-2 tablets by mouth every 4 (four) hours as needed for severe pain.        Allergies:  Allergies  Allergen Reactions  . Penicillins Rash  . Hydrocodone-Acetaminophen Nausea Only  . Novocain [Procaine] Other (See Comments) and Nausea And Vomiting    Chest pain  . Sulfa Antibiotics Other (See Comments)    CHEST PAIN    Family History: Family History  Problem Relation Age of Onset  . Stroke Mother   . Breast cancer Maternal Aunt     x 2    Social History:  reports that she has never smoked. She has never used smokeless tobacco. She reports that she does not drink alcohol or use illicit drugs.  ROS: UROLOGY Frequent Urination?: No Hard to postpone urination?: No Burning/pain with urination?: No Get up at night to urinate?: Yes Leakage of urine?: Yes Urine stream starts and stops?: No Trouble starting stream?: No Do you have to strain to urinate?: No Blood in urine?: No Urinary tract infection?: No Sexually transmitted disease?: No Injury to kidneys or bladder?: No Painful intercourse?: No Weak stream?: No Currently pregnant?: No Vaginal bleeding?: No Last menstrual period?: No  Gastrointestinal Nausea?: No Vomiting?: No Indigestion/heartburn?: No Diarrhea?: No Constipation?: No  Constitutional Fever: No Night sweats?: No Weight loss?: No Fatigue?: No  Skin Skin rash/lesions?:  No Itching?: No  Eyes Blurred vision?: No Double vision?: No  Ears/Nose/Throat Sore throat?: No Sinus problems?: No  Hematologic/Lymphatic Swollen glands?: No Easy bruising?: No  Cardiovascular Leg swelling?: No Chest pain?: No  Respiratory Cough?: No Shortness of breath?: No  Endocrine Excessive thirst?: No  Musculoskeletal Back pain?: No Joint pain?: No  Neurological Dizziness?: No  Psychologic Depression?: No Anxiety?: No  Physical Exam: BP 105/70 mmHg  Pulse 67  Ht 5\' 4"  (1.626 m)  Wt 122 lb 8 oz (55.566 kg)  BMI 21.02 kg/m2  Constitutional:  Alert and oriented, No acute  distress. HEENT: [place] AT, moist mucus membranes.  Trachea midline, no masses. Cardiovascular: No clubbing, cyanosis, or edema. Respiratory: Normal respiratory effort, no increased work of breathing. GI: Abdomen is soft, nontender, nondistended, no abdominal masses GU: No CVA tenderness.  Skin: No rashes, bruises or suspicious lesions. Lymph: No cervical or inguinal adenopathy. Neurologic: Grossly intact, no focal deficits, moving all 4 extremities. Psychiatric: Normal mood and affect.  Laboratory Data: Lab Results  Component Value Date   WBC 4.8 01/04/2015   HGB 14.9 01/04/2015   HCT 43.8 01/04/2015   MCV 92.7 01/04/2015   PLT 299 01/04/2015    Lab Results  Component Value Date   CREATININE 1.11* 01/04/2015    No results found for: PSA  No results found for: TESTOSTERONE  Lab Results  Component Value Date   HGBA1C 5.5 04/08/2011    Urinalysis    Component Value Date/Time   COLORURINE YELLOW* 10/20/2014 0936   COLORURINE Yellow 03/29/2012 1930   APPEARANCEUR CLEAR* 10/20/2014 0936   APPEARANCEUR Hazy 03/29/2012 1930   LABSPEC 1.013 10/20/2014 0936   LABSPEC 1.023 03/29/2012 1930   PHURINE 6.0 10/20/2014 0936   PHURINE 7.0 03/29/2012 1930   GLUCOSEU NEGATIVE 10/20/2014 0936   GLUCOSEU 50 mg/dL 03/29/2012 1930   HGBUR NEGATIVE 10/20/2014 0936   HGBUR Negative  03/29/2012 1930   BILIRUBINUR NEGATIVE 10/20/2014 0936   BILIRUBINUR Negative 03/29/2012 1930   KETONESUR NEGATIVE 10/20/2014 0936   KETONESUR 1+ 03/29/2012 1930   PROTEINUR NEGATIVE 10/20/2014 0936   PROTEINUR Negative 03/29/2012 1930   NITRITE NEGATIVE 10/20/2014 0936   NITRITE Negative 03/29/2012 1930   LEUKOCYTESUR NEGATIVE 10/20/2014 0936   LEUKOCYTESUR Negative 03/29/2012 1930    Pertinent Imaging: CLINICAL DATA: Left breast cancer, recent diagnosis. History of endometrial cancer in 2013, recurrent.  EXAM: CT ABDOMEN AND PELVIS WITH CONTRAST  TECHNIQUE: Multidetector CT imaging of the abdomen and pelvis was performed using the standard protocol following bolus administration of intravenous contrast.  CONTRAST: 5mL OMNIPAQUE IOHEXOL 300 MG/ML SOLN  COMPARISON: 11/09/2014  FINDINGS: Lower chest: Pectus excavatum, Haller index 3.1. Mild diffuse distal esophageal wall thickening.  Hepatobiliary: Stably diminutive left hepatic lobe. Appendix unremarkable.  Pancreas: Stable slight prominence of dorsal pancreatic duct.  Spleen: Unremarkable  Adrenals/Urinary Tract: Atrophic right kidney, double-J ureteral stent on the right with loops an appropriate positioning. Mild nodularity of the right adrenal gland. Fullness of the right collecting system without hydronephrosis. Poor excretion of the right kidney compared to the left. Small left peripelvic cysts.  Stomach/Bowel: Sigmoid diverticulosis.  Vascular/Lymphatic: 2.8 by 1.1 cm soft tissue density along the left pelvic sidewall, I doubt this is ovary given its morphology, previously 2.6 by 1.1 cm, suspicious for tumor. 1.3 cm soft tissue nodule in the upper perirectal space, image 57 series 2, previously 1.5 cm.  Reproductive: Uterus absent. Mild thickening of the left vaginal cuff, 0.9 by 1.4 cm, formerly 1.1 by 1.5 cm, possibly a tumor deposit.  Other: 1.1 by 1.0 cm tumor deposit along the  lower margin of the left paracolic gutter, image 50 series 2, formerly 2.0 by 1.3 cm.  Musculoskeletal: Large destructive right iliac bone mass with extension into the right iloipsoas muscle and right gluteus minimus and medius muscles, tumor approximately 6.5 by 5.5 cm, formerly 5.8 by 5.3 cm by my measurements of, with tumor tracking adjacent to the sacral plexus and upper sciatic nerve similar to prior. Posterolateral rod and pedicle screw fixation at L3-4 bilaterally. Degenerative loss of intervertebral disc height at L5-S1.  Levoconvex thoracolumbar scoliosis.  IMPRESSION: 1. Mixed response, with some of the tumor deposits smaller (such as the tumor deposit along the lower margin of the left paracolic gutter) The posterior margin of this latter lesion may be impinging up, but with enlargement of the destructive right iliac bone metastatic lesion with extensive extraosseous extension.on the right sacral plexus. 2. Other suspected tumor deposits along the left pelvic sidewall, in the upper perirectal space, and along the left side of the vaginal cuff, generally similar size to prior. 3. Placement of a double-J ureteral stent on the right, with atrophic right kidney. 4. Other imaging findings of potential clinical significance: Pectus excavatum. Distal esophageal wall thickening suggesting esophagitis. Sigmoid diverticulosis.   Assessment & Plan:    1. Malignant right hydroureteronephrosis The patient is status antegrade right ureteral stent. She is due for a stent exchange in January 2017. She is scheduled to see Dr. Oliva Bustard next week. We will determine when to change her stent based on the length for chemotherapy. If her chemotherapy will be over an extended period time, we will need to change the stent prior to chemotherapy induction.   2. Metastatic endometrial cancer Management per Dr. Oliva Bustard   The patient will call us next week when she knows what her next step is in  regards to her therapy.  [name], MD  [facility] [address] [place], [place] [address] [phone]

## 2015-02-09 ENCOUNTER — Ambulatory Visit
Admission: RE | Admit: 2015-02-09 | Discharge: 2015-02-09 | Disposition: A | Discharge status: Home / Self Care | Payer: Medicare Other | Source: Ambulatory Visit | Attending: Radiation Oncology | Admitting: Radiation Oncology

## 2015-02-09 ENCOUNTER — Inpatient Hospital Stay: Payer: Medicare Other | Attending: Oncology | Admitting: Oncology

## 2015-02-09 ENCOUNTER — Encounter: Payer: Self-pay | Admitting: Radiation Oncology

## 2015-02-09 ENCOUNTER — Ambulatory Visit
Admission: RE | Admit: 2015-02-09 | Discharge: 2015-02-09 | Disposition: A | Discharge status: Home / Self Care | Payer: Medicare Other | Source: Ambulatory Visit | Attending: Oncology | Admitting: Oncology

## 2015-02-09 VITALS — BP 132/78 | HR 58 | Temp 96.7°F | Wt 121.5 lb

## 2015-02-09 VITALS — BP 121/71 | HR 64 | Temp 98.4°F | Resp 18 | Wt 121.7 lb

## 2015-02-09 DIAGNOSIS — I82432 Acute embolism and thrombosis of left popliteal vein: Secondary | ICD-10-CM | POA: Diagnosis not present

## 2015-02-09 DIAGNOSIS — Z8673 Personal history of transient ischemic attack (TIA), and cerebral infarction without residual deficits: Secondary | ICD-10-CM | POA: Diagnosis not present

## 2015-02-09 DIAGNOSIS — C7911 Secondary malignant neoplasm of bladder: Secondary | ICD-10-CM | POA: Diagnosis not present

## 2015-02-09 DIAGNOSIS — K5730 Diverticulosis of large intestine without perforation or abscess without bleeding: Secondary | ICD-10-CM | POA: Diagnosis not present

## 2015-02-09 DIAGNOSIS — Z923 Personal history of irradiation: Secondary | ICD-10-CM | POA: Insufficient documentation

## 2015-02-09 DIAGNOSIS — C541 Malignant neoplasm of endometrium: Secondary | ICD-10-CM | POA: Diagnosis not present

## 2015-02-09 DIAGNOSIS — Z8542 Personal history of malignant neoplasm of other parts of uterus: Secondary | ICD-10-CM | POA: Insufficient documentation

## 2015-02-09 DIAGNOSIS — M7989 Other specified soft tissue disorders: Secondary | ICD-10-CM | POA: Diagnosis not present

## 2015-02-09 DIAGNOSIS — C50812 Malignant neoplasm of overlapping sites of left female breast: Secondary | ICD-10-CM | POA: Diagnosis not present

## 2015-02-09 DIAGNOSIS — R319 Hematuria, unspecified: Secondary | ICD-10-CM | POA: Diagnosis not present

## 2015-02-09 DIAGNOSIS — Z86010 Personal history of colonic polyps: Secondary | ICD-10-CM | POA: Diagnosis not present

## 2015-02-09 DIAGNOSIS — M25551 Pain in right hip: Secondary | ICD-10-CM | POA: Diagnosis not present

## 2015-02-09 DIAGNOSIS — Z79899 Other long term (current) drug therapy: Secondary | ICD-10-CM | POA: Diagnosis not present

## 2015-02-09 DIAGNOSIS — Z170 Estrogen receptor positive status [ER+]: Secondary | ICD-10-CM | POA: Insufficient documentation

## 2015-02-09 DIAGNOSIS — R109 Unspecified abdominal pain: Secondary | ICD-10-CM | POA: Diagnosis not present

## 2015-02-09 DIAGNOSIS — Z7901 Long term (current) use of anticoagulants: Secondary | ICD-10-CM | POA: Diagnosis not present

## 2015-02-09 DIAGNOSIS — C7951 Secondary malignant neoplasm of bone: Secondary | ICD-10-CM | POA: Diagnosis not present

## 2015-02-09 DIAGNOSIS — N189 Chronic kidney disease, unspecified: Secondary | ICD-10-CM | POA: Diagnosis not present

## 2015-02-09 DIAGNOSIS — N1330 Unspecified hydronephrosis: Secondary | ICD-10-CM | POA: Insufficient documentation

## 2015-02-09 DIAGNOSIS — K219 Gastro-esophageal reflux disease without esophagitis: Secondary | ICD-10-CM | POA: Diagnosis not present

## 2015-02-09 DIAGNOSIS — Z803 Family history of malignant neoplasm of breast: Secondary | ICD-10-CM | POA: Insufficient documentation

## 2015-02-09 DIAGNOSIS — Z86718 Personal history of other venous thrombosis and embolism: Secondary | ICD-10-CM | POA: Diagnosis not present

## 2015-02-09 DIAGNOSIS — Z90722 Acquired absence of ovaries, bilateral: Secondary | ICD-10-CM | POA: Insufficient documentation

## 2015-02-09 DIAGNOSIS — Z7902 Long term (current) use of antithrombotics/antiplatelets: Secondary | ICD-10-CM | POA: Insufficient documentation

## 2015-02-09 DIAGNOSIS — M545 Low back pain: Secondary | ICD-10-CM | POA: Insufficient documentation

## 2015-02-09 DIAGNOSIS — Z90710 Acquired absence of both cervix and uterus: Secondary | ICD-10-CM | POA: Diagnosis not present

## 2015-02-09 MED ORDER — RIVAROXABAN 15 MG PO TABS: 15.0000 mg | ORAL_TABLET | Freq: Two times a day (BID) | ORAL | Status: DC

## 2015-02-09 MED ORDER — RIVAROXABAN 20 MG PO TABS
20.0000 mg | ORAL_TABLET | Freq: Every day | ORAL | Status: DC
Start: 2015-02-09 — End: 2015-02-11

## 2015-02-09 NOTE — Progress Notes (Signed)
Radiation Oncology Follow up Note  Name: Traci [name]   Date:   02/09/2015 MRN:  [id number] DOB: 12-24-1929    This 82 y.o. female presents to the clinic today for follow-up palliative radiation therapy to her pelvis for metastatic him an endometrial cancer.  REFERRING PROVIDER: Forest Gleason, MD  HPI: Patient is a 82 year old female now 1 month out having completed palliative radiation therapy to her pelvis for endometrial carcinoma initially presented with positive peritoneal cytology status post TAH/BSO. Tumor a deep myometrial invasion underwent vaginal brachytherapy although recently was diagnosed with right hydronephrosis and on CT scan had bilateral pelvic lymphadenopathy with a large right iliac bone metastasis extending into the psoas muscle. She is seen today one month out of palliative radiation therapy to her pelvis and is doing much better pain is completely under control with 50 g fentanyl patch and occasional breakthrough pain. She specifically denies diarrhea dysuria or any other GI/GU complaints.. Recent follow-up CT scan on 02/04/2015 showed a mixed response with slight increase in large minute of the destructive right iliac bone lesion although my review it is become markedly necrotic. Other suspected tumor deposits along the left pelvic sidewall appear smaller in size. She is seeing medical oncology today for possible Megace treatment and further treatment recommendations.  COMPLICATIONS OF TREATMENT: none  FOLLOW UP COMPLIANCE: keeps appointments   PHYSICAL EXAM:  BP 121/71 mmHg  Pulse 64  Temp(Src) 98.4 F (36.9 C)  Resp 18  Wt 121 lb 11.1 oz (55.2 kg) Well-developed well-nourished patient in NAD. HEENT reveals PERLA, EOMI, discs not visualized.  Oral cavity is clear. No oral mucosal lesions are identified. Neck is clear without evidence of cervical or supraclavicular adenopathy. Lungs are clear to A&P. Cardiac examination is essentially unremarkable with regular rate  and rhythm without murmur rub or thrill. Abdomen is benign with no organomegaly or masses noted. Motor sensory and DTR levels are equal and symmetric in the upper and lower extremities. Cranial nerves II through XII are grossly intact. Proprioception is intact. No peripheral adenopathy or edema is identified. No motor or sensory levels are noted. Crude visual fields are within normal range.  RADIOLOGY RESULTS: CT scan is reviewed and compatible with the above-stated findings  PLAN: At this time she is under good palliative control of her stage IV Demetra cancer. I will turn follow-up care over to medical oncology. Question still is remains to do with her breast cancer I believe with her widespread metastatic disease at this time we can continue to observe that or make further recommendations based on medical oncology's opinion. I will be happy to reevaluate the patient any time should further palliative radiation therapy be indicated.  I would like to take this opportunity for allowing me to participate in the care of your patient.Armstead Peaks., MD

## 2015-02-09 NOTE — Progress Notes (Signed)
Patient here for CT results.  

## 2015-02-10 ENCOUNTER — Emergency Department
Admission: EM | Admit: 2015-02-10 | Discharge: 2015-02-10 | Disposition: A | Discharge status: Home / Self Care | Payer: Medicare Other | Attending: Emergency Medicine | Admitting: Emergency Medicine

## 2015-02-10 ENCOUNTER — Encounter: Payer: Self-pay | Admitting: Emergency Medicine

## 2015-02-10 ENCOUNTER — Telehealth: Payer: Self-pay | Admitting: *Deleted

## 2015-02-10 DIAGNOSIS — R319 Hematuria, unspecified: Secondary | ICD-10-CM | POA: Diagnosis present

## 2015-02-10 DIAGNOSIS — Z79899 Other long term (current) drug therapy: Secondary | ICD-10-CM | POA: Diagnosis not present

## 2015-02-10 DIAGNOSIS — Z791 Long term (current) use of non-steroidal anti-inflammatories (NSAID): Secondary | ICD-10-CM | POA: Insufficient documentation

## 2015-02-10 DIAGNOSIS — Z79891 Long term (current) use of opiate analgesic: Secondary | ICD-10-CM | POA: Diagnosis not present

## 2015-02-10 DIAGNOSIS — Z880 Allergy status to penicillin: Secondary | ICD-10-CM | POA: Insufficient documentation

## 2015-02-10 DIAGNOSIS — Z7901 Long term (current) use of anticoagulants: Secondary | ICD-10-CM | POA: Diagnosis not present

## 2015-02-10 DIAGNOSIS — Z792 Long term (current) use of antibiotics: Secondary | ICD-10-CM | POA: Diagnosis not present

## 2015-02-10 DIAGNOSIS — Z9229 Personal history of other drug therapy: Secondary | ICD-10-CM

## 2015-02-10 LAB — URINALYSIS COMPLETE WITH MICROSCOPIC (ARMC ONLY)
BILIRUBIN URINE: NEGATIVE
Bacteria, UA: NONE SEEN
Glucose, UA: NEGATIVE mg/dL
KETONES UR: NEGATIVE mg/dL
Leukocytes, UA: NEGATIVE
Nitrite: NEGATIVE
Protein, ur: NEGATIVE mg/dL
SQUAMOUS EPITHELIAL / LPF: NONE SEEN
Specific Gravity, Urine: 1.006 (ref 1.005–1.030)
pH: 5 (ref 5.0–8.0)

## 2015-02-10 LAB — CBC
HEMATOCRIT: 38.5 % (ref 35.0–47.0)
HEMOGLOBIN: 12.8 g/dL (ref 12.0–16.0)
MCH: 31.3 pg (ref 26.0–34.0)
MCHC: 33.2 g/dL (ref 32.0–36.0)
MCV: 94.2 fL (ref 80.0–100.0)
Platelets: 260 10*3/uL (ref 150–440)
RBC: 4.09 MIL/uL (ref 3.80–5.20)
RDW: 14.2 % (ref 11.5–14.5)
WBC: 5 10*3/uL (ref 3.6–11.0)

## 2015-02-10 LAB — COMPREHENSIVE METABOLIC PANEL
ALK PHOS: 52 U/L (ref 38–126)
ALT: 13 U/L — ABNORMAL LOW (ref 14–54)
ANION GAP: 7 (ref 5–15)
AST: 16 U/L (ref 15–41)
Albumin: 3.9 g/dL (ref 3.5–5.0)
BILIRUBIN TOTAL: 0.6 mg/dL (ref 0.3–1.2)
BUN: 32 mg/dL — ABNORMAL HIGH (ref 6–20)
CALCIUM: 9 mg/dL (ref 8.9–10.3)
CO2: 22 mmol/L (ref 22–32)
Chloride: 110 mmol/L (ref 101–111)
Creatinine, Ser: 1.09 mg/dL — ABNORMAL HIGH (ref 0.44–1.00)
GFR calc non Af Amer: 46 mL/min — ABNORMAL LOW (ref >60)
GFR, EST AFRICAN AMERICAN: 53 mL/min — AB (ref >60)
GLUCOSE: 92 mg/dL (ref 65–99)
Potassium: 4 mmol/L (ref 3.5–5.1)
Sodium: 139 mmol/L (ref 135–145)
TOTAL PROTEIN: 7 g/dL (ref 6.5–8.1)

## 2015-02-10 MED ORDER — ENOXAPARIN SODIUM 60 MG/0.6ML ~~LOC~~ SOLN
1.0000 mg/kg | Freq: Once | SUBCUTANEOUS | Status: AC
  Administered 2015-02-10: 55 mg via SUBCUTANEOUS
  Filled 2015-02-10 (×2): qty 0.6

## 2015-02-10 NOTE — Telephone Encounter (Signed)
Reports bright red hematuria x 4 episodes. Took the Xarelto at 630am today. This was her first dose. She only took 1 tablet. I asked the patient to hold her Xarelto until further notice.   RN spoke with Dr. Oliva Bustard. Per Dr. Oliva Bustard. Have patient hold the xarelto. Have RN call[facility] as patient needs an urgent ivc filter placed. H/o DVT, endometrial cancer and breast. Pt will need to come asap in the morning for cbc and pt. She may need to start Lovenox injections.  I spoke with Maudie Mercury, [place] at [facility]. Patient will either need to go to the ED at this time or have Dr. Oliva Bustard talk directly to Dr. Lucky Cowboy. Dr. Delana Meyer is out of the office at this time and will not be back until tomorrow morning.

## 2015-02-10 NOTE — ED Notes (Signed)
Pt started taking xarelto this AM, has only taken 1 pill.  Pt recently d/c plavix due to recent blood clot in leg.  Dr Oliva Bustard changed pt recently.  Pt has had blood in urine all day.  Dr Oliva Bustard recommended pt to come to ER.  Pt is current bladder/pelvic cancer patient of Dr Oliva Bustard.

## 2015-02-10 NOTE — ED Provider Notes (Signed)
[facility] Emergency Department Provider Note  ____________________________________________  Time seen: 1920  I have reviewed the triage vital signs and the nursing notes.  History by:  Patient and her husband  HISTORY  Chief Complaint Hematuria     HPI Traci [name] is a 82 y.o. female with a form of cancer in her pelvis. She has been on Plavix. She was diagnosed with a DVT 2 days ago. Her Plavix was discontinued and she was placed on Xarelto, first dose this morning. She then developed hematuria. She does have an atrophic right kidney and has a stent in the right kidney. This was seen on recent CT on 02/04/2015. Due to the hematuria, she called her oncologist, Dr. Oliva Bustard. Dr. Oliva Bustard wants her to continue on some form of anticoagulant due to the large DVT that was seen. He had attempted to reach Dr. Leotis Pain this afternoon to discuss possible IVC filter placement. He spoke with Dr. Lucky Cowboy later in the afternoon but the patient had already come to the emergency department. Some of this history comes from my conversation with DR. [name].  The patient denies any weakness. The patient denies any pain. She is concerned about the hematuria that has continued to the day.   Past Medical History  Diagnosis Date  . History of esophageal stricture   . GERD (gastroesophageal reflux disease)   . Inguinal lymphocyst   . History of brachytherapy   . History of shingles   . History of colon polyps   . Mild aphasia   . Hearing loss   . History of DVT (deep vein thrombosis)   . Stroke ([place]) 04/06/2011    residual:  aphasia  . Hiccups   . Pain     SEVERE CHRONIC RIGHT LEG,HIP,FOOT  . Chronic kidney disease     HYDRONEPHROSIS  . Endometrial adenocarcinoma ([place]) 01/2012     stage Ib, grade 1, positive peritoneal cytology, no other risk factors or extra-uterine disease  . Cancer of left breast ([place]) 10/20/2014  . Bone cancer ([place])   . Arthritis     Degenerative Disc  Disease  . Gastric ulcer     Patient Active Problem List   Diagnosis Date Noted  . Endometrial cancer ([place]) 11/18/2014  . Degeneration of intervertebral disc of lumbosacral region 11/14/2014  . Overflow incontinence 11/14/2014  . Pure hypercholesterolemia 11/14/2014  . Acquired hypothyroidism 10/20/2014  . DDD (degenerative disc disease), lumbosacral 10/20/2014  . Benign neoplasm of colon 10/20/2014  . Acid reflux 10/20/2014  . Esophageal stenosis 10/20/2014  . Bloodgood disease 10/20/2014  . History of colon polyps 10/20/2014  . H/O gastric ulcer 10/20/2014  . Healed or old pulmonary embolism 10/20/2014  . History of urinary anomaly 10/20/2014  . BP (high blood pressure) 10/20/2014  . Hypersomnia with sleep apnea 10/20/2014  . Multinodular goiter 10/20/2014  . Angina pectoris ([place]) 10/20/2014  . Seizure ([place]) 10/20/2014  . Digestive symptom 10/20/2014  . Incontinence overflow, urine 10/20/2014  . Dupuytren's contracture of foot 10/20/2014  . Hypercholesterolemia without hypertriglyceridemia 10/20/2014  . Cancer of left breast ([place]) 10/20/2014  . History of endometrial cancer 09/30/2014  . Lumbar radiculopathy 09/04/2014  . Deep vein thrombosis ([place]) 07/15/2013  . Deep vein thrombosis (DVT) ([place]) 07/15/2013  . Stroke [facility]) 04/07/2011    Past Surgical History  Procedure Laterality Date  . Esophageal dilation    . Thyroidectomy, partial    . Cataract extraction w/ intraocular lens implant      right  .  Back surgery      fusion of L3&4  . Tonsillectomy    . Total abdominal hysterectomy w/ bilateral salpingoophorectomy Bilateral     staging biopsies  . Breast cyst aspiration N/A   . Breast biopsy Left 10/13/2014    path pending  . Cystoscopy w/ retrogrades Right 10/28/2014    Procedure: CYSTOSCOPY WITH RETROGRADE PYELOGRAM ATTEMPT, UNABLE TO PASS ;  Surgeon: [name], MD;  Location: [facility] ORS;  Service: Urology;  Laterality: Right;  . Cystoscopy with stent  placement Right 10/28/2014    Procedure: BLADDER BIOPSY WITH FULGERATION ;  Surgeon: [name], MD;  Location: [facility] ORS;  Service: Urology;  Laterality: Right;  . Thyroid removed    . Eye surgery Bilateral     Cataract Extraction  . Abdominal hysterectomy    . Portacath placement Right 11/26/2014    Procedure: INSERTION PORT-A-CATH;  Surgeon: [name], MD;  Location: [facility] ORS;  Service: General;  Laterality: Right;    Current Outpatient Rx  Name  Route  Sig  Dispense  Refill  . docusate sodium (COLACE) 100 MG capsule   Oral   Take 100 mg by mouth 2 (two) times daily.         . fentaNYL (DURAGESIC - DOSED MCG/HR) 50 MCG/HR   Transdermal   Place 1 patch (50 mcg total) onto the skin every 3 (three) days.   10 patch   0   . gabapentin (NEURONTIN) 300 MG capsule   Oral   Take 1 capsule (300 mg total) by mouth 3 (three) times daily.   90 capsule   2   . gentamicin ointment (GARAMYCIN) 0.1 %               . megestrol (MEGACE) 40 MG tablet   Oral   Take 1 tablet (40 mg total) by mouth 4 (four) times daily.   120 tablet   3   . meloxicam (MOBIC) 15 MG tablet   Oral   Take 1 tablet (15 mg total) by mouth daily.   30 tablet   0   . Multiple Vitamins-Minerals (MULTIVITAMIN WITH MINERALS) tablet   Oral   Take 1 tablet by mouth daily.         Marland Kitchen oxyCODONE-acetaminophen (PERCOCET/ROXICET) 5-325 MG tablet   Oral   Take 1-2 tablets by mouth every 4 (four) hours as needed for severe pain.   90 tablet   0   . Rivaroxaban (XARELTO) 15 MG TABS tablet   Oral   Take 1 tablet (15 mg total) by mouth 2 (two) times daily with a meal.   28 tablet   0   . rivaroxaban (XARELTO) 20 MG TABS tablet   Oral   Take 1 tablet (20 mg total) by mouth daily with supper. Start once finishes 15mg  xarelto.   30 tablet   6     Allergies Penicillins; Hydrocodone-acetaminophen; Novocain; and Sulfa antibiotics  Family History  Problem Relation Age of Onset  . Stroke  Mother   . Breast cancer Maternal Aunt     x 2    Social History Social History  Substance Use Topics  . Smoking status: Never Smoker   . Smokeless tobacco: Never Used  . Alcohol Use: No    Review of Systems  Constitutional: Negative for fever/chills. ENT: Negative for congestion. Cardiovascular: Negative for chest pain. Current diagnosis of DVT. Respiratory: Negative for cough. Gastrointestinal: Negative for abdominal pain, vomiting and diarrhea. Genitourinary: Hematuria Musculoskeletal:  No back pain. Skin: Negative for rash. Neurological: Negative for headache or focal weakness   10-point ROS otherwise negative.  ____________________________________________   PHYSICAL EXAM:  VITAL SIGNS: ED Triage Vitals  Enc Vitals Group     BP 02/10/15 1712 140/60 mmHg     Pulse Rate 02/10/15 1712 63     Resp 02/10/15 1712 16     Temp 02/10/15 1712 97.4 F (36.3 C)     Temp Source 02/10/15 1712 Oral     SpO2 02/10/15 1712 99 %     Weight 02/10/15 1712 121 lb (54.885 kg)     Height 02/10/15 1712 5\' 4"  (1.626 m)     Head Cir --      Peak Flow --      Pain Score 02/10/15 1713 0     Pain Loc --      Pain Edu? --      Excl. in [place]? --     Constitutional: Alert and oriented. Well appearing and in no distress. ENT   Head: Normocephalic and atraumatic.   Nose: No congestion/rhinnorhea.       Mouth: No erythema, no swelling   Cardiovascular: Normal rate, regular rhythm, no murmur noted Respiratory:  Normal respiratory effort, no tachypnea.    Breath sounds are clear and equal bilaterally.  Gastrointestinal: Soft, no distention. Nontender Back: No muscle spasm, no tenderness, no CVA tenderness. Musculoskeletal: No deformity noted. Nontender with normal range of motion in all extremities.  No noted edema. Neurologic:  Communicative. Normal appearing spontaneous movement in all 4 extremities. No gross focal neurologic deficits are appreciated.  Skin:  Skin is warm, dry.  No rash noted. Psychiatric: Mood and affect are normal. Speech and behavior are normal.  ____________________________________________    LABS (pertinent positives/negatives)  Labs Reviewed  URINALYSIS COMPLETEWITH MICROSCOPIC ([place] ONLY) - Abnormal; Notable for the following:    Color, Urine YELLOW (*)    APPearance HAZY (*)    Hgb urine dipstick 3+ (*)    All other components within normal limits  COMPREHENSIVE METABOLIC PANEL - Abnormal; Notable for the following:    BUN 32 (*)    Creatinine, Ser 1.09 (*)    ALT 13 (*)    GFR calc non Af Amer 46 (*)    GFR calc Af Amer 53 (*)    All other components within normal limits  CBC     ____________________________________________  ____________________________________________   INITIAL IMPRESSION / ASSESSMENT AND PLAN / ED COURSE  Pertinent labs & imaging results that were available during my care of the patient were reviewed by me and considered in my medical decision making (see chart for details).  Well-appearing 56 all female with cancer, now is DVT, started on Xarelto, and has developed hematuria. I have called and spoken with Dr. Levora Dredge, oncology. She had spoke with Dr. Oliva Bustard who once the patient to be continued on some form of anticoagulant. The recommendation was to have her not take the Xarelto but to be given an injection of Lovenox this evening in the emergency department. They have offered to see the patient first thing in the morning. They have been trying to make arrangements for a filter with vascular surgery. I called and spoke with Dr. Lucky Cowboy who agrees with the IVC filter placement. He reports he will see the patient pursing the morning to place a filter.  ____________________________________________   FINAL CLINICAL IMPRESSION(S) / ED DIAGNOSES  Final diagnoses:  Hematuria  HX: anticoagulation  Ahmed Prima, MD 02/10/15 [phone]

## 2015-02-10 NOTE — ED Notes (Signed)
Pt changed from plavix to xarelto today; Pt reports urine is red today, pt reports being tired and rectal soreness. Pt with hx of breast and pelvic cancer, dx yesterday with DVT in right leg. Pt denies any chest pain or shortness of breath. Pt reports recent radiation tx but denies chemo.

## 2015-02-10 NOTE — Telephone Encounter (Signed)
Pt was advised to go to ED. Dr. Oliva Bustard spoke with patient over the phone.

## 2015-02-10 NOTE — Discharge Instructions (Signed)
You currently still have the effect of Plavix in her system. You have also taken Xarelto. Dr. Oliva Bustard has asked [place] to give you an injection of Lovenox instead of having a take the Xarelto. We have also spoken with vascular surgery, Dr. Lucky Cowboy, who had spoken with Dr. Oliva Bustard and both believe he would benefit from having a intravascular filter placed. Call Dr. Bunnie Domino office first thing in the morning to arrange for this tomorrow. You may also call Dr. Liane Comber has offered to see first in the morning as well as.  You will likely continue to have blood and urine. Your hemoglobin level is excellent. Return to the emergency department if he feel weak or if you have other urgent concerns.  Hematuria, Adult Hematuria is blood in your urine. It can be caused by a bladder infection, kidney infection, prostate infection, kidney stone, or cancer of your urinary tract. Infections can usually be treated with medicine, and a kidney stone usually will pass through your urine. If neither of these is the cause of your hematuria, further workup to find out the reason may be needed. It is very important that you tell your health care provider about any blood you see in your urine, even if the blood stops without treatment or happens without causing pain. Blood in your urine that happens and then stops and then happens again can be a symptom of a very serious condition. Also, pain is not a symptom in the initial stages of many urinary cancers. HOME CARE INSTRUCTIONS   Drink lots of fluid, 3-4 quarts a day. If you have been diagnosed with an infection, cranberry juice is especially recommended, in addition to large amounts of water.  Avoid caffeine, tea, and carbonated beverages because they tend to irritate the bladder.  Avoid alcohol because it may irritate the prostate.  Take all medicines as directed by your health care provider.  If you were prescribed an antibiotic medicine, finish it all even if you start to feel  better.  If you have been diagnosed with a kidney stone, follow your health care provider's instructions regarding straining your urine to catch the stone.  Empty your bladder often. Avoid holding urine for long periods of time.  After a bowel movement, women should cleanse front to back. Use each tissue only once.  Empty your bladder before and after sexual intercourse if you are a female. SEEK MEDICAL CARE IF:  You develop back pain.  You have a fever.  You have a feeling of sickness in your stomach (nausea) or vomiting.  Your symptoms are not better in 3 days. Return sooner if you are getting worse. SEEK IMMEDIATE MEDICAL CARE IF:   You develop severe vomiting and are unable to keep the medicine down.  You develop severe back or abdominal pain despite taking your medicines.  You begin passing a large amount of blood or clots in your urine.  You feel extremely weak or faint, or you pass out. MAKE SURE YOU:   Understand these instructions.  Will watch your condition.  Will get help right away if you are not doing well or get worse.   This information is not intended to replace advice given to you by your health care provider. Make sure you discuss any questions you have with your health care provider.   Document Released: 01/23/2005 Document Revised: 02/13/2014 Document Reviewed: 09/23/2012 Elsevier Interactive Patient Education [employer].

## 2015-02-10 NOTE — ED Notes (Signed)
Pt discharged to home with husband.  Discharge instructions reviewed.  Voiced understanding.  No questions or concerns at this time.  Pt in NAD.  No items left in ED.

## 2015-02-10 NOTE — ED Notes (Signed)
Pharmacy notified to send lovenox dose for patient.

## 2015-02-11 ENCOUNTER — Inpatient Hospital Stay (HOSPITAL_BASED_OUTPATIENT_CLINIC_OR_DEPARTMENT_OTHER): Payer: Medicare Other | Admitting: Oncology

## 2015-02-11 ENCOUNTER — Ambulatory Visit
Admission: AD | Admit: 2015-02-11 | Discharge: 2015-02-11 | Disposition: A | Discharge status: Home / Self Care | Payer: Medicare Other | Source: Ambulatory Visit | Attending: Vascular Surgery | Admitting: Vascular Surgery

## 2015-02-11 ENCOUNTER — Telehealth: Payer: Self-pay | Admitting: *Deleted

## 2015-02-11 ENCOUNTER — Encounter
Admission: AD | Disposition: A | Discharge status: Home / Self Care | Payer: Self-pay | Source: Ambulatory Visit | Attending: Vascular Surgery

## 2015-02-11 ENCOUNTER — Other Ambulatory Visit: Payer: Self-pay | Admitting: Family Medicine

## 2015-02-11 VITALS — BP 139/81 | HR 66 | Temp 97.0°F | Wt 122.1 lb

## 2015-02-11 DIAGNOSIS — C50919 Malignant neoplasm of unspecified site of unspecified female breast: Secondary | ICD-10-CM | POA: Diagnosis not present

## 2015-02-11 DIAGNOSIS — C7911 Secondary malignant neoplasm of bladder: Secondary | ICD-10-CM | POA: Diagnosis not present

## 2015-02-11 DIAGNOSIS — Z8542 Personal history of malignant neoplasm of other parts of uterus: Secondary | ICD-10-CM | POA: Diagnosis not present

## 2015-02-11 DIAGNOSIS — Z170 Estrogen receptor positive status [ER+]: Secondary | ICD-10-CM

## 2015-02-11 DIAGNOSIS — K219 Gastro-esophageal reflux disease without esophagitis: Secondary | ICD-10-CM | POA: Diagnosis not present

## 2015-02-11 DIAGNOSIS — I82409 Acute embolism and thrombosis of unspecified deep veins of unspecified lower extremity: Secondary | ICD-10-CM | POA: Insufficient documentation

## 2015-02-11 DIAGNOSIS — R319 Hematuria, unspecified: Secondary | ICD-10-CM | POA: Diagnosis present

## 2015-02-11 DIAGNOSIS — N189 Chronic kidney disease, unspecified: Secondary | ICD-10-CM | POA: Insufficient documentation

## 2015-02-11 DIAGNOSIS — C541 Malignant neoplasm of endometrium: Secondary | ICD-10-CM | POA: Diagnosis not present

## 2015-02-11 DIAGNOSIS — Z86010 Personal history of colonic polyps: Secondary | ICD-10-CM | POA: Insufficient documentation

## 2015-02-11 DIAGNOSIS — Z8719 Personal history of other diseases of the digestive system: Secondary | ICD-10-CM | POA: Diagnosis not present

## 2015-02-11 DIAGNOSIS — C7951 Secondary malignant neoplasm of bone: Secondary | ICD-10-CM

## 2015-02-11 DIAGNOSIS — M7989 Other specified soft tissue disorders: Secondary | ICD-10-CM

## 2015-02-11 DIAGNOSIS — Z8673 Personal history of transient ischemic attack (TIA), and cerebral infarction without residual deficits: Secondary | ICD-10-CM | POA: Diagnosis not present

## 2015-02-11 DIAGNOSIS — Z7901 Long term (current) use of anticoagulants: Secondary | ICD-10-CM | POA: Insufficient documentation

## 2015-02-11 DIAGNOSIS — I8290 Acute embolism and thrombosis of unspecified vein: Secondary | ICD-10-CM

## 2015-02-11 DIAGNOSIS — I82432 Acute embolism and thrombosis of left popliteal vein: Secondary | ICD-10-CM

## 2015-02-11 DIAGNOSIS — Z90710 Acquired absence of both cervix and uterus: Secondary | ICD-10-CM

## 2015-02-11 DIAGNOSIS — Z7902 Long term (current) use of antithrombotics/antiplatelets: Secondary | ICD-10-CM

## 2015-02-11 DIAGNOSIS — Z90722 Acquired absence of ovaries, bilateral: Secondary | ICD-10-CM

## 2015-02-11 DIAGNOSIS — I82402 Acute embolism and thrombosis of unspecified deep veins of left lower extremity: Secondary | ICD-10-CM

## 2015-02-11 DIAGNOSIS — C50812 Malignant neoplasm of overlapping sites of left female breast: Secondary | ICD-10-CM

## 2015-02-11 HISTORY — DX: Acute embolism and thrombosis of unspecified deep veins of left lower extremity: I82.402

## 2015-02-11 HISTORY — PX: PERIPHERAL VASCULAR CATHETERIZATION: SHX172C

## 2015-02-11 SURGERY — IVC FILTER INSERTION
Anesthesia: Moderate Sedation

## 2015-02-11 MED ORDER — FENTANYL CITRATE (PF) 100 MCG/2ML IJ SOLN
INTRAMUSCULAR | Status: DC | PRN
  Administered 2015-02-11: 50 ug via INTRAVENOUS

## 2015-02-11 MED ORDER — DEXTROSE 50 % IV SOLN: 0.5000 | Freq: Once | INTRAVENOUS | Status: DC | PRN

## 2015-02-11 MED ORDER — LIDOCAINE-EPINEPHRINE (PF) 1 %-1:200000 IJ SOLN
INTRAMUSCULAR | Status: DC | PRN
  Administered 2015-02-11: 10 mL via INTRADERMAL

## 2015-02-11 MED ORDER — HEPARIN (PORCINE) IN NACL 2-0.9 UNIT/ML-% IJ SOLN
INTRAMUSCULAR | Status: AC
  Filled 2015-02-11: qty 500

## 2015-02-11 MED ORDER — FENTANYL CITRATE (PF) 100 MCG/2ML IJ SOLN
INTRAMUSCULAR | Status: AC
  Filled 2015-02-11: qty 2

## 2015-02-11 MED ORDER — MIDAZOLAM HCL 2 MG/2ML IJ SOLN
INTRAMUSCULAR | Status: DC | PRN
  Administered 2015-02-11: 1 mg via INTRAVENOUS

## 2015-02-11 MED ORDER — MIDAZOLAM HCL 2 MG/2ML IJ SOLN
INTRAMUSCULAR | Status: AC
  Filled 2015-02-11: qty 2

## 2015-02-11 MED ORDER — CLINDAMYCIN PHOSPHATE 300 MG/50ML IV SOLN
300.0000 mg | Freq: Once | INTRAVENOUS | Status: AC
  Administered 2015-02-11: 300 mg via INTRAVENOUS

## 2015-02-11 MED ORDER — LIDOCAINE-EPINEPHRINE (PF) 1 %-1:200000 IJ SOLN
INTRAMUSCULAR | Status: AC
  Filled 2015-02-11: qty 30

## 2015-02-11 MED ORDER — ONDANSETRON HCL 4 MG/2ML IJ SOLN: 4.0000 mg | INTRAMUSCULAR | Status: DC | PRN

## 2015-02-11 MED ORDER — ATROPINE SULFATE 0.1 MG/ML IJ SOLN: 0.5000 mg | Freq: Once | INTRAMUSCULAR | Status: DC | PRN

## 2015-02-11 MED ORDER — SODIUM CHLORIDE 0.9 % IV SOLN
INTRAVENOUS | Status: DC
  Administered 2015-02-11: 12:00:00 via INTRAVENOUS

## 2015-02-11 MED ORDER — CLINDAMYCIN PHOSPHATE 300 MG/50ML IV SOLN
INTRAVENOUS | Status: AC
  Filled 2015-02-11: qty 50

## 2015-02-11 MED ORDER — IOHEXOL 300 MG/ML  SOLN
INTRAMUSCULAR | Status: DC | PRN
  Administered 2015-02-11: 15 mL via INTRA_ARTERIAL

## 2015-02-11 SURGICAL SUPPLY — 2 items
FILTER VC CELECT-FEMORAL (Filter) ×2 IMPLANT
PACK ANGIOGRAPHY (CUSTOM PROCEDURE TRAY) ×2 IMPLANT

## 2015-02-11 NOTE — Telephone Encounter (Signed)
Pt needs to come to clinic ASAP for evaluation and to be set up for IVC filter placement today by Dr. Lucky Cowboy.

## 2015-02-11 NOTE — Telephone Encounter (Signed)
Message from Dr. Mike Gip this morning. Patient seen in ED yesterday for hematuria - states there is a change in her blood thinners and wants to know what to do now?

## 2015-02-11 NOTE — H&P (Signed)
[facility] Admission History & Physical  MRN : [id number]  Traci [name] is a 82 y.o. (13-Jul-1929) female who presents with chief complaint of No chief complaint on file. Marland Kitchen  History of Present Illness: I am asked to see the patient by Dr. Oliva Bustard in Oncology for her DVT and hematuria to consider IVC filter placement.  She has a history of multiple malignancies including endometrial cancer and breast cancer. She has a history of urologic issues and a previous ureteral stent. A few days ago, she noted left lower extremity swelling. There was no trauma or injury or clear inciting event. A duplex was performed which demonstrated a left lower extremity DVT. With elevation, the swelling has subsided slightly but has not resolved. It is also painful and is a burning and aching pain in the left leg. She was started appropriately on Xarelto. With the initial dose, she had hematuria. After a second dose of Xarelto her hematuria worsen. She had to stop to Xarelto yesterday and was seen by her oncologist. With an acute DVT and hematuria precluding anticoagulation, he contacted me and discussed placement of an IVC filter which I felt was very reasonable. Patient presents for evaluation and placement of this filter.  Current Facility-Administered Medications  Medication Dose Route Frequency Provider Last Rate Last Dose  . 0.9 %  sodium chloride infusion   Intravenous Continuous Algernon Huxley, MD      . atropine 0.1 MG/ML injection 0.5 mg  0.5 mg Intravenous Once PRN Algernon Huxley, MD      . dextrose 50 % solution 25 mL  0.5 ampule Intravenous Once PRN Algernon Huxley, MD      . ondansetron [facility]) injection 4 mg  4 mg Intravenous Q30 min PRN Algernon Huxley, MD        Past Medical History  Diagnosis Date  . History of esophageal stricture   . GERD (gastroesophageal reflux disease)   . Inguinal lymphocyst   . History of brachytherapy   . History of shingles   . History of colon polyps    . Mild aphasia   . Hearing loss   . History of DVT (deep vein thrombosis)   . Stroke ([place]) 04/06/2011    residual:  aphasia  . Hiccups   . Pain     SEVERE CHRONIC RIGHT LEG,HIP,FOOT  . Chronic kidney disease     HYDRONEPHROSIS  . Endometrial adenocarcinoma ([place]) 01/2012     stage Ib, grade 1, positive peritoneal cytology, no other risk factors or extra-uterine disease  . Cancer of left breast ([place]) 10/20/2014  . Bone cancer ([place])   . Arthritis     Degenerative Disc Disease  . Gastric ulcer     Past Surgical History  Procedure Laterality Date  . Esophageal dilation    . Thyroidectomy, partial    . Cataract extraction w/ intraocular lens implant      right  . Back surgery      fusion of L3&4  . Tonsillectomy    . Total abdominal hysterectomy w/ bilateral salpingoophorectomy Bilateral     staging biopsies  . Breast cyst aspiration N/A   . Breast biopsy Left 10/13/2014    path pending  . Cystoscopy w/ retrogrades Right 10/28/2014    Procedure: CYSTOSCOPY WITH RETROGRADE PYELOGRAM ATTEMPT, UNABLE TO PASS ;  Surgeon: [name], MD;  Location: [facility] ORS;  Service: Urology;  Laterality: Right;  . Cystoscopy with stent placement Right 10/28/2014  Procedure: BLADDER BIOPSY WITH FULGERATION ;  Surgeon: [name], MD;  Location: [facility] ORS;  Service: Urology;  Laterality: Right;  . Thyroid removed    . Eye surgery Bilateral     Cataract Extraction  . Abdominal hysterectomy    . Portacath placement Right 11/26/2014    Procedure: INSERTION PORT-A-CATH;  Surgeon: [name], MD;  Location: [facility] ORS;  Service: General;  Laterality: Right;    Social History Social History  Substance Use Topics  . Smoking status: Never Smoker   . Smokeless tobacco: Never Used  . Alcohol Use: No   no IV drug use  Family History Family History  Problem Relation Age of Onset  . Stroke Mother   . Breast cancer Maternal Aunt     x 2   no bleeding disorders, clotting  disorders, or autoimmune diseases  Allergies  Allergen Reactions  . Penicillins Rash  . Hydrocodone-Acetaminophen Nausea Only  . Novocain [Procaine] Other (See Comments) and Nausea And Vomiting    Chest pain  . Sulfa Antibiotics Other (See Comments)    CHEST PAIN     REVIEW OF SYSTEMS (Negative unless checked)  Constitutional: [] Weight loss  [] Fever  [] Chills Cardiac: [] Chest pain   [] Chest pressure   [] Palpitations   [] Shortness of breath when laying flat   [] Shortness of breath at rest   [] Shortness of breath with exertion. Vascular:  [] Pain in legs with walking   [] Pain in legs at rest   [] Pain in legs when laying flat   [] Claudication   [] Pain in feet when walking  [] Pain in feet at rest  [] Pain in feet when laying flat   [x] History of DVT   [x] Phlebitis   [x] Swelling in legs   [] Varicose veins   [] Non-healing ulcers Pulmonary:   [] Uses home oxygen   [] Productive cough   [] Hemoptysis   [] Wheeze  [] COPD   [] Asthma Neurologic:  [] Dizziness  [] Blackouts   [] Seizures   [] History of stroke   [] History of TIA  [] Aphasia   [] Temporary blindness   [] Dysphagia   [] Weakness or numbness in arms   [] Weakness or numbness in legs Musculoskeletal:  [] Arthritis   [] Joint swelling   [] Joint pain   [] Low back pain Hematologic:  [] Easy bruising  [] Easy bleeding   [] Hypercoagulable state   [] Anemic  [] Hepatitis Gastrointestinal:  [] Blood in stool   [] Vomiting blood  [] Gastroesophageal reflux/heartburn   [] Difficulty swallowing. Genitourinary:  [] Chronic kidney disease   [x] Difficult urination  [] Frequent urination  [] Burning with urination   [x] Blood in urine Skin:  [] Rashes   [] Ulcers   [] Wounds Psychological:  [] History of anxiety   []  History of major depression.  Physical Examination  Filed Vitals:   02/11/15 1109  BP: 103/70  Pulse: 66  Temp: 97.9 F (36.6 C)  TempSrc: Oral  Resp: 20  Height: 5\' 4"  (D34-534 m)  Weight: 55.339 kg (122 lb)  SpO2: 96%   Body mass index is 20.93  kg/(m^2). Gen: WD/WN, NAD Head: [place]/AT, No temporalis wasting. Prominent temp pulse not noted. Ear/Nose/Throat: Hearing grossly intact, nares w/o erythema or drainage, oropharynx w/o Erythema/Exudate,  Eyes: PERRLA, EOMI.  Neck: Supple, no nuchal rigidity.  No JVD.  Pulmonary:  Good air movement,, no use of accessory muscles.  Cardiac: RRR, normal S1, S2. Vascular:  Vessel Right Left  Radial Palpable Palpable  Ulnar Palpable Palpable  Brachial Palpable Palpable  Carotid Palpable, without bruit Palpable, without bruit  Aorta Not palpable N/A  Femoral Palpable Palpable  Popliteal  Palpable Palpable  PT  not Palpable  not Palpable  DP Palpable  not Palpable   Gastrointestinal: soft, non-tender/non-distended. No guarding/reflex.  Musculoskeletal: M/S 5/5 throughout.  Extremities without ischemic changes.  No deformity or atrophy. 2+ left lower extremity swelling Neurologic: CN 2-12 intact. Pain and light touch intact in extremities.  Symmetrical.  Speech is fluent. Motor exam as listed above. Psychiatric: Judgment intact, Mood & affect appropriate for pt's clinical situation. Dermatologic: No rashes or ulcers noted.  No cellulitis or open wounds. Lymph : No Cervical, Axillary, or Inguinal lymphadenopathy.   CBC Lab Results  Component Value Date   WBC 5.0 02/10/2015   HGB 12.8 02/10/2015   HCT 38.5 02/10/2015   MCV 94.2 02/10/2015   PLT 260 02/10/2015    BMET    Component Value Date/Time   NA 139 02/10/2015 1716   NA 139 11/04/2013 1257   K 4.0 02/10/2015 1716   K 4.3 03/05/2014 1156   CL 110 02/10/2015 1716   CL 106 11/04/2013 1257   CO2 22 02/10/2015 1716   CO2 26 11/04/2013 1257   GLUCOSE 92 02/10/2015 1716   GLUCOSE 96 11/04/2013 1257   BUN 32* 02/10/2015 1716   BUN 13 11/04/2013 1257   CREATININE 1.09* 02/10/2015 1716   CREATININE 0.90 11/04/2013 1257   CALCIUM 9.0 02/10/2015 1716   CALCIUM 8.5 11/04/2013 1257   GFRNONAA 46* 02/10/2015 1716   GFRNONAA >60  11/04/2013 1257   GFRNONAA >60 03/29/2012 1650   GFRAA 53* 02/10/2015 1716   GFRAA >60 11/04/2013 1257   GFRAA >60 03/29/2012 1650   Estimated Creatinine Clearance: 33.8 mL/min (by C-G formula based on Cr of 1.09).  COAG Lab Results  Component Value Date   INR 0.97 11/13/2014   INR 1.0 02/09/2014   INR 1.0 11/04/2013    Radiology Ct Abdomen Pelvis W Contrast  02/04/2015  CLINICAL DATA:  Left breast cancer, recent diagnosis. History of endometrial cancer in 2013, recurrent. EXAM: CT ABDOMEN AND PELVIS WITH CONTRAST TECHNIQUE: Multidetector CT imaging of the abdomen and pelvis was performed using the standard protocol following bolus administration of intravenous contrast. CONTRAST:  27mL OMNIPAQUE IOHEXOL 300 MG/ML  SOLN COMPARISON:  11/09/2014 FINDINGS: Lower chest: Pectus excavatum, Haller index 3.1. Mild diffuse distal esophageal wall thickening. Hepatobiliary: Stably diminutive left hepatic lobe. Appendix unremarkable. Pancreas: Stable slight prominence of dorsal pancreatic duct. Spleen: Unremarkable Adrenals/Urinary Tract: Atrophic right kidney, double-J ureteral stent on the right with loops an appropriate positioning. Mild nodularity of the right adrenal gland. Fullness of the right collecting system without hydronephrosis. Poor excretion of the right kidney compared to the left. Small left peripelvic cysts. Stomach/Bowel: Sigmoid diverticulosis. Vascular/Lymphatic: 2.8 by 1.1 cm soft tissue density along the left pelvic sidewall, I doubt this is ovary given its morphology, previously 2.6 by 1.1 cm, suspicious for tumor. 1.3 cm soft tissue nodule in the upper perirectal space, image 57 series 2, previously 1.5 cm. Reproductive: Uterus absent. Mild thickening of the left vaginal cuff, 0.9 by 1.4 cm, formerly 1.1 by 1.5 cm, possibly a tumor deposit. Other: 1.1 by 1.0 cm tumor deposit along the lower margin of the left paracolic gutter, image 50 series 2, formerly 2.0 by 1.3 cm.  Musculoskeletal: Large destructive right iliac bone mass with extension into the right iloipsoas muscle and right gluteus minimus and medius muscles, tumor approximately 6.5 by 5.5 cm, formerly 5.8 by 5.3 cm by my measurements of, with tumor tracking adjacent to the sacral plexus and upper sciatic  nerve similar to prior. Posterolateral rod and pedicle screw fixation at L3-4 bilaterally. Degenerative loss of intervertebral disc height at L5-S1. Levoconvex thoracolumbar scoliosis. IMPRESSION: 1. Mixed response, with some of the tumor deposits smaller (such as the tumor deposit along the lower margin of the left paracolic gutter), but with enlargement of the destructive right iliac bone metastatic lesion with extensive extraosseous extension. The posterior margin of this latter lesion may be impinging upon the right sacral plexus. 2. Other suspected tumor deposits along the left pelvic sidewall, in the upper perirectal space, and along the left side of the vaginal cuff, generally similar size to prior. 3. Placement of a double-J ureteral stent on the right, with atrophic right kidney. 4. Other imaging findings of potential clinical significance: Pectus excavatum. Distal esophageal wall thickening suggesting esophagitis. Sigmoid diverticulosis. Electronically Signed   By: Van Clines M.D.   On: 02/04/2015 13:06   US Venous Img Lower Unilateral Left  02/09/2015  CLINICAL DATA:  Left lower extremity swelling. EXAM: LEFT LOWER EXTREMITY VENOUS DOPPLER ULTRASOUND TECHNIQUE: Gray-scale sonography with graded compression, as well as color Doppler and duplex ultrasound were performed to evaluate the lower extremity deep venous systems from the level of the common femoral vein and including the common femoral, femoral, profunda femoral, popliteal and calf veins including the posterior tibial, peroneal and gastrocnemius veins when visible. The superficial great saphenous vein was also interrogated. Spectral Doppler was  utilized to evaluate flow at rest and with distal augmentation maneuvers in the common femoral, femoral and popliteal veins. COMPARISON:  None. FINDINGS: Contralateral Common Femoral Vein: Respiratory phasicity is normal and symmetric with the symptomatic side. No evidence of thrombus. Normal compressibility. Common Femoral Vein: No evidence of thrombus. Normal compressibility, respiratory phasicity and response to augmentation. Saphenofemoral Junction: No evidence of thrombus. Normal compressibility and flow on color Doppler imaging. Profunda Femoral Vein: No evidence of thrombus. Normal compressibility and flow on color Doppler imaging. Femoral Vein: Occlusive thrombus in the mid and distal left femoral vein. Popliteal Vein: Nonocclusive thrombus the popliteal vein. Calf Veins: No evidence of thrombus. Normal compressibility and flow on color Doppler imaging. Peroneal veins not visualized. Superficial Great Saphenous Vein: No evidence of thrombus. Normal compressibility and flow on color Doppler imaging. Venous Reflux:  None. Other Findings:  None. IMPRESSION: Positive study for deep venous thrombosis. Electronically Signed   By: [place]   On: 02/09/2015 15:12     Assessment/Plan 1. LLE DVT.  Failed Xarelto with bleeding and would need a filter at this point.  Plan placement today.  Plan removal if started on anticoagulants or in 3-6 months depending on response of her DVT. 2. Hematuria.  Was unable to take Xarelto.  No neurologic issues previously so this is unlikely to subside. The main reason for filter placement. 3. History of endometrial cancer. Followed by oncology. 4. History of stroke. Stable. 5. Breast cancer. Followed by oncology   Fitzgerald Dunne, MD  02/11/2015 11:12 AM

## 2015-02-11 NOTE — Progress Notes (Signed)
Patient seen in ED yesterday for hematuria.  Told to see Dr. Oliva Bustard this am.  Acute add on.  To see Dr. Lucky Cowboy at noon.

## 2015-02-11 NOTE — Op Note (Signed)
[facility]   OPERATIVE NOTE    PRE-OPERATIVE DIAGNOSIS: DVT with hematuria on Xarelto  POST-OPERATIVE DIAGNOSIS: same as above  PROCEDURE: 1.   Ultrasound guidance for vascular access to the right femoral vein 2.   Catheter placement into the inferior vena cava 3.   Inferior venacavogram 4.   Placement of a Cook Celect IVC filter  SURGEON: Leotis Pain, MD  ASSISTANT(S): None  ANESTHESIA: local/sedation  ESTIMATED BLOOD LOSS: minimal  FINDING(S): 1.  Patent IVC  SPECIMEN(S):  none  INDICATIONS:   Traci [name] is a 82 y.o. female who presents with hematuria requiring cessation of Xarelto and a DVT.  Inferior vena cava filter is indicated for this reason.  Risks and benefits including filter thrombosis, migration, fracture, bleeding, and infection were all discussed.  We discussed that all IVC filters that we place can be removed if desired from the patient once the need for the filter has passed.    DESCRIPTION: After obtaining full informed written consent, the patient was brought back to the vascular suite. The skin was sterilely prepped and draped in a sterile surgical field was created. The right femoral vein was accessed under direct ultrasound guidance without difficulty with a Seldinger needle and a J-wire was then placed. After skin nick and dilatation, the delivery sheath was placed into the inferior vena cava and an inferior venacavogram was performed. This demonstrated a patent IVC with the level of the renal veins at L1.  The filter was then deployed into the inferior vena cava at the level of L2 just below the renal veins. The delivery sheath was then removed. Pressure was held. Sterile dressings were placed. The patient tolerated the procedure well and was taken to the recovery room in stable condition.  COMPLICATIONS: None  CONDITION: Stable  [name],[name]  02/11/2015, 1:20 PM

## 2015-02-11 NOTE — Discharge Instructions (Signed)
Inferior Vena Cava Filter Insertion, Care After  Refer to this sheet in the next few weeks. These instructions provide you with information on caring for yourself after your procedure. Your health care provider may also give you more specific instructions. Your treatment has been planned according to current medical practices, but problems sometimes occur. Call your health care provider if you have any problems or questions after your procedure.  WHAT TO EXPECT AFTER THE PROCEDURE  After your procedure, it is typical to have the following:   Mild pain in the area where the filter was inserted.   Mild bruising in the area where the filter was inserted.  HOME CARE INSTRUCTIONS   You will be given medicine to control pain. Only take over-the-counter or prescription medicines for pain, fever, or discomfort as directed by your health care provider.   A bandage (dressing) has been placed over the insertion site. Follow your health care provider's instructions on how to care for it.   Keep the insertion site clean and dry.   Do not soak in a bath tub or pool until the filter insertion site has healed.   Do not drive if you are taking narcotic pain medicines. Follow your health care provider's instructions about driving.   Do not return to work or school until your health care provider says it is okay.    Keep all follow-up appointments.   SEEK IMMEDIATE MEDICAL CARE IF:   You develop swelling and discoloration or pain in the legs.   Your legs become pale and cold or blue.   You develop shortness of breath, feel faint, or pass out.   You develop chest pain, a cough, or difficulty breathing.   You cough up blood.   You develop a rash or feel you are having problems that may be a side effect of medicines.   You develop weakness, difficulty moving your arms or legs, or balance problems.   You develop problems with speech or vision.     This information is not intended to replace advice given to you by your  health care provider. Make sure you discuss any questions you have with your health care provider.     Document Released: 11/13/2012 Document Reviewed: 11/13/2012  Elsevier Interactive Patient Education 2016 [employer]

## 2015-02-11 NOTE — Telephone Encounter (Signed)
Patient instructed to come to office to be seen.  Patient verbalized understanding and will be here asap.

## 2015-02-12 ENCOUNTER — Telehealth: Payer: Self-pay | Admitting: Urology

## 2015-02-12 ENCOUNTER — Encounter: Payer: Self-pay | Admitting: Oncology

## 2015-02-12 ENCOUNTER — Telehealth: Payer: Self-pay | Admitting: Radiology

## 2015-02-12 ENCOUNTER — Telehealth: Payer: Self-pay

## 2015-02-12 NOTE — Telephone Encounter (Signed)
  Oncology Nurse Navigator Documentation    Navigator Encounter Type: Telephone (02/12/15 0900)               Barriers/Navigation Needs: Coordination of Care (02/12/15 0900)   Interventions: Coordination of Care (02/12/15 0900)                      Time Spent with Patient: 15 (02/12/15 0900)   Notified [facility] to arrange for stent exchange that is due in late Jan. Last stent placed in IR. They will notify me if it can be exchanged by them or if she needs to have it exchanged through IR.

## 2015-02-12 NOTE — Telephone Encounter (Signed)
Dr. Marcelle Overlie office called and patient needs her stent changed do we need to do it in office or in IR? Can you let Judson Roch or Amy know what needs to be done so that we can let his office know and contact the patient. [phone] we need to call Kristi with an answer when we find out.  Thanks, Sharyn Lull

## 2015-02-12 NOTE — Progress Notes (Signed)
[place] @ [facility] Telephone:([phone]) [phone]  Fax:([phone]) [phone]  Progress note  Traci [name] OB: [birth year]  MR#: [id number]  NGE#:[id number]  Patient Care Team: Idelle Crouch, MD as PCP - General (Unknown Physician Specialty) [name], MD as Referring Physician (Surgery) [name], MD as Consulting Physician (Urology)  CHIEF COMPLAINT:  Chief Complaint  Patient presents with  . Acute Visit  1/  VISIT DIAGNOSIS:   No diagnosis found.  Oncology History   1.  Carcinoma of left breast  IMPRESSION: Ultrasound-guided biopsy of a suspicious left breast mass at 9 o'clock. No apparent complications.   2.  Estrogen receptor positive.  Progesterone receptor positive.  HER-2 receptor 2+ by  I[facility] . Fish is pending  5 mm tumor clinically stage IB N0 M0 tumor   3.  Patient has a previous history of endometrium  carcinoma of endometrium stage IB disease status post bilateral salpingo-oophorectomy and radiation therapy 4.  Right hydronephrosis detected on MRI scan off lumbosacral spine.  Cystoscopy with attempted stent placement revealed bladder tumor in September of 2016 biopsies positive for recurrent endometrial cancer  5.  Patient had  stent placement in the right kidney Started radiation therapy 6.  Patient has finished radiation therapy with relief in the pain (November, 2016) 7.  Started on Megace in December of 2016 8 deep vein thrombosis in the left lower extremity (February 08, 2014) Hematuria due to 0 all toe so IVC filter placement on January 11, 2015  Oncology Flowsheet 04/08/2011 10/28/2014 02/10/2015  dexamethasone (DECADRON) IJ - - -  enoxaparin (LOVENOX) [place] 40 mg - 1 mg/kg  ondansetron (ZOFRAN) IV - - -    INTERVAL HISTORY:  82 year old lady came today further follow-up patient has 5 mm infiltrating ductal carcinoma of breast during the workup patient was found to have recurrent endometrial cancer with extensive retroperitoneal adenopathy and pelvic  wall invasion and bladder invasion started on palliative radiation therapy  During evaluation of third January patient was found to have deep vein thrombosis.  Patient was started on xeralto.  I received a phone call on fourth January after known the patient started having multiple episodes of hematuria.  As hematuria continued patient was referred to emergency room where patient received at shot of Lovenox.  This morning patient is now being evaluated.  Hematuria continues.  No abdominal pain no nausea no vomiting swelling in the left lower extremity is improved. Because of persistent hematuria secondary to 0O I contacted Dr. Corene Cornea do vascular surgeon for IVC filter placement.        REVIEW OF SYSTEMS:   GENERAL:  Feels good.  Active.  No fevers, sweats or weight loss. PERFORMANCE STATUS (ECOG):  01 HEENT:  No visual changes, runny nose, sore throat, mouth sores or tenderness. Lungs: No shortness of breath or cough.  No hemoptysis. Cardiac:  No chest pain, palpitations, orthopnea, or PND. Right flank pain.  Right low back pain.  Right pain radiating down her right lower extremity.  Recent cystoscopy is consistent with bladder tumor.  Biopsies positive for endometrial cancer  Musculoskeletal:  No back pain.  No joint pain.  No muscle tenderness. Extremities:  No pain or swelling. Skin:  No rashes or skin changes. Neuro:  No headache, numbness or weakness, balance or coordination issues. Endocrine:  No diabetes, thyroid issues, hot flashes or night sweats. Psych:  No mood changes, depression or anxiety. Pain: Significant relief in the pain Will try to taper off Patient from pain medication,  monitoring pain  Review of systems:  All other systems reviewed and found to be negative.  As per HPI. Otherwise, a complete review of systems is negatve.  PAST MEDICAL HISTORY: Past Medical History  Diagnosis Date  . History of esophageal stricture   . GERD (gastroesophageal reflux disease)   .  Inguinal lymphocyst   . History of brachytherapy   . History of shingles   . History of colon polyps   . Mild aphasia   . Hearing loss   . History of DVT (deep vein thrombosis)   . Stroke ([place]) 04/06/2011    residual:  aphasia  . Hiccups   . Pain     SEVERE CHRONIC RIGHT LEG,HIP,FOOT  . Chronic kidney disease     HYDRONEPHROSIS  . Endometrial adenocarcinoma ([place]) 01/2012     stage Ib, grade 1, positive peritoneal cytology, no other risk factors or extra-uterine disease  . Cancer of left breast ([place]) 10/20/2014  . Bone cancer ([place])   . Arthritis     Degenerative Disc Disease  . Gastric ulcer     PAST SURGICAL HISTORY: Past Surgical History  Procedure Laterality Date  . Esophageal dilation    . Thyroidectomy, partial    . Cataract extraction w/ intraocular lens implant      right  . Back surgery      fusion of L3&4  . Tonsillectomy    . Total abdominal hysterectomy w/ bilateral salpingoophorectomy Bilateral     staging biopsies  . Breast cyst aspiration N/A   . Breast biopsy Left 10/13/2014    path pending  . Cystoscopy w/ retrogrades Right 10/28/2014    Procedure: CYSTOSCOPY WITH RETROGRADE PYELOGRAM ATTEMPT, UNABLE TO PASS ;  Surgeon: [name], MD;  Location: [facility] ORS;  Service: Urology;  Laterality: Right;  . Cystoscopy with stent placement Right 10/28/2014    Procedure: BLADDER BIOPSY WITH FULGERATION ;  Surgeon: [name], MD;  Location: [facility] ORS;  Service: Urology;  Laterality: Right;  . Thyroid removed    . Eye surgery Bilateral     Cataract Extraction  . Abdominal hysterectomy    . Portacath placement Right 11/26/2014    Procedure: INSERTION PORT-A-CATH;  Surgeon: [name], MD;  Location: [facility] ORS;  Service: General;  Laterality: Right;    FAMILY HISTORY Family History  Problem Relation Age of Onset  . Stroke Mother   . Breast cancer Maternal Aunt     x 2        ADVANCED DIRECTIVES:  Patient does not have any living will or  healthcare power of attorney.  Information was given .  Available resources had been discussed.  We will follow-up on subsequent appointments regarding this issue  HEALTH MAINTENANCE: Social History  Substance Use Topics  . Smoking status: Never Smoker   . Smokeless tobacco: Never Used  . Alcohol Use: No      Allergies  Allergen Reactions  . Penicillins Rash  . Hydrocodone-Acetaminophen Nausea Only  . Novocain [Procaine] Other (See Comments) and Nausea And Vomiting    Chest pain  . Sulfa Antibiotics Other (See Comments)    CHEST PAIN    Current Outpatient Prescriptions  Medication Sig Dispense Refill  . docusate sodium (COLACE) 100 MG capsule Take 100 mg by mouth 2 (two) times daily.    . fentaNYL (DURAGESIC - DOSED MCG/HR) 50 MCG/HR Place 1 patch (50 mcg total) onto the skin every 3 (three) days. 10 patch 0  . gabapentin (NEURONTIN)  300 MG capsule Take 1 capsule (300 mg total) by mouth 3 (three) times daily. 90 capsule 2  . gentamicin ointment (GARAMYCIN) 0.1 %     . megestrol (MEGACE) 40 MG tablet Take 1 tablet (40 mg total) by mouth 4 (four) times daily. 120 tablet 3  . meloxicam (MOBIC) 15 MG tablet Take 1 tablet (15 mg total) by mouth daily. 30 tablet 0  . Multiple Vitamins-Minerals (MULTIVITAMIN WITH MINERALS) tablet Take 1 tablet by mouth daily.    Marland Kitchen oxyCODONE-acetaminophen (PERCOCET/ROXICET) 5-325 MG tablet Take 1-2 tablets by mouth every 4 (four) hours as needed for severe pain. 90 tablet 0  . clopidogrel (PLAVIX) 75 MG tablet Take 75 mg by mouth daily.     No current facility-administered medications for this visit.    OBJECTIVE: PHYSICAL EXAM: GENERAL:  Well developed, well nourished, sitting comfortably in the exam room in no acute distress. MENTAL STATUS:  Alert and oriented to person, place and time.  ENT:  Oropharynx clear without lesion.  Tongue normal. Mucous membranes moist.  RESPIRATORY:  Clear to auscultation without rales, wheezes or  rhonchi. CARDIOVASCULAR:  Regular rate and rhythm without murmur, rub or gallop.  ABDOMEN:  Soft, non-tender, with active bowel sounds, and no hepatosplenomegaly.  No masses. BACK:   CVA tenderness.  No tenderness on percussion of the back or rib cage. SKIN:  No rashes, ulcers or lesions. EXTREMITIES: Left lower extremity edema LYMPH NODES: No palpable cervical, supraclavicular, axillary or inguinal adenopathy  NEUROLOGICAL: Unremarkable. PSYCH:  Appropriate.  Filed Vitals:   02/11/15 1015  BP: 139/81  Pulse: 66  Temp: 97 F (36.1 C)     Body mass index is 20.95 kg/(m^2).    ECOG FS:1 - Symptomatic but completely ambulatory  LAB RESULTS:  CBC Latest Ref Rng 02/10/2015 01/04/2015 12/24/2014  WBC 3.6 - 11.0 K/uL 5.0 4.8 5.6  Hemoglobin 12.0 - 16.0 g/dL 24.0 82.0 64.9  Hematocrit 35.0 - 47.0 % 38.5 43.8 40.9  Platelets 150 - 440 K/uL 260 299 265    STUDIES: Ct Abdomen Pelvis W Contrast  02/04/2015  CLINICAL DATA:  Left breast cancer, recent diagnosis. History of endometrial cancer in 2013, recurrent. EXAM: CT ABDOMEN AND PELVIS WITH CONTRAST TECHNIQUE: Multidetector CT imaging of the abdomen and pelvis was performed using the standard protocol following bolus administration of intravenous contrast. CONTRAST:  32mL OMNIPAQUE IOHEXOL 300 MG/ML  SOLN COMPARISON:  11/09/2014 FINDINGS: Lower chest: Pectus excavatum, Haller index 3.1. Mild diffuse distal esophageal wall thickening. Hepatobiliary: Stably diminutive left hepatic lobe. Appendix unremarkable. Pancreas: Stable slight prominence of dorsal pancreatic duct. Spleen: Unremarkable Adrenals/Urinary Tract: Atrophic right kidney, double-J ureteral stent on the right with loops an appropriate positioning. Mild nodularity of the right adrenal gland. Fullness of the right collecting system without hydronephrosis. Poor excretion of the right kidney compared to the left. Small left peripelvic cysts. Stomach/Bowel: Sigmoid diverticulosis.  Vascular/Lymphatic: 2.8 by 1.1 cm soft tissue density along the left pelvic sidewall, I doubt this is ovary given its morphology, previously 2.6 by 1.1 cm, suspicious for tumor. 1.3 cm soft tissue nodule in the upper perirectal space, image 57 series 2, previously 1.5 cm. Reproductive: Uterus absent. Mild thickening of the left vaginal cuff, 0.9 by 1.4 cm, formerly 1.1 by 1.5 cm, possibly a tumor deposit. Other: 1.1 by 1.0 cm tumor deposit along the lower margin of the left paracolic gutter, image 50 series 2, formerly 2.0 by 1.3 cm. Musculoskeletal: Large destructive right iliac bone mass with extension into the  right iloipsoas muscle and right gluteus minimus and medius muscles, tumor approximately 6.5 by 5.5 cm, formerly 5.8 by 5.3 cm by my measurements of, with tumor tracking adjacent to the sacral plexus and upper sciatic nerve similar to prior. Posterolateral rod and pedicle screw fixation at L3-4 bilaterally. Degenerative loss of intervertebral disc height at L5-S1. Levoconvex thoracolumbar scoliosis. IMPRESSION: 1. Mixed response, with some of the tumor deposits smaller (such as the tumor deposit along the lower margin of the left paracolic gutter), but with enlargement of the destructive right iliac bone metastatic lesion with extensive extraosseous extension. The posterior margin of this latter lesion may be impinging upon the right sacral plexus. 2. Other suspected tumor deposits along the left pelvic sidewall, in the upper perirectal space, and along the left side of the vaginal cuff, generally similar size to prior. 3. Placement of a double-J ureteral stent on the right, with atrophic right kidney. 4. Other imaging findings of potential clinical significance: Pectus excavatum. Distal esophageal wall thickening suggesting esophagitis. Sigmoid diverticulosis. Electronically Signed   By: Van Clines M.D.   On: 02/04/2015 13:06   US Venous Img Lower Unilateral Left  02/09/2015  CLINICAL DATA:  Left  lower extremity swelling. EXAM: LEFT LOWER EXTREMITY VENOUS DOPPLER ULTRASOUND TECHNIQUE: Gray-scale sonography with graded compression, as well as color Doppler and duplex ultrasound were performed to evaluate the lower extremity deep venous systems from the level of the common femoral vein and including the common femoral, femoral, profunda femoral, popliteal and calf veins including the posterior tibial, peroneal and gastrocnemius veins when visible. The superficial great saphenous vein was also interrogated. Spectral Doppler was utilized to evaluate flow at rest and with distal augmentation maneuvers in the common femoral, femoral and popliteal veins. COMPARISON:  None. FINDINGS: Contralateral Common Femoral Vein: Respiratory phasicity is normal and symmetric with the symptomatic side. No evidence of thrombus. Normal compressibility. Common Femoral Vein: No evidence of thrombus. Normal compressibility, respiratory phasicity and response to augmentation. Saphenofemoral Junction: No evidence of thrombus. Normal compressibility and flow on color Doppler imaging. Profunda Femoral Vein: No evidence of thrombus. Normal compressibility and flow on color Doppler imaging. Femoral Vein: Occlusive thrombus in the mid and distal left femoral vein. Popliteal Vein: Nonocclusive thrombus the popliteal vein. Calf Veins: No evidence of thrombus. Normal compressibility and flow on color Doppler imaging. Peroneal veins not visualized. Superficial Great Saphenous Vein: No evidence of thrombus. Normal compressibility and flow on color Doppler imaging. Venous Reflux:  None. Other Findings:  None. IMPRESSION: Positive study for deep venous thrombosis. Electronically Signed   By: Marcello Moores  Register   On: 02/09/2015 15:12    ASSESSMENT:  Carcinoma of left breast at 9:00 position clinically stage is T1b N0 M0 tumor estrogen receptor positive progesterone receptor positive HER-2/neu receptor pending by fish Breast cancer surgery has  been put on hold because of diagnosis of endometrial cancer 2.  Recurrent endometrial cancer 3.  Deep vein thrombosis.  Patient was started on   xeralto  and Plavix was discontinued.  She started multiple episodes of significant hematuria. xeralto was discontinued.  I called Dr. Leotis Pain regarding deep vein thrombosis and possibility of filter placement he agreed. Patient was advised to start Plavix again in next 48 hours after hematuria gradually resolves  Re: Hematuria I contacted urologist for possibility of proceeding with cystoscopy and stent change  His office would be in contact with the patient.  After evaluation, bladder and start patient back on anticoagulation if needed Total duration of  visit was 26mnutes.  50% or more time was spent in counseling patient  Coordinating care with urologist and vascular surgeon he 8

## 2015-02-12 NOTE — Telephone Encounter (Signed)
Notified pt of surgery scheduled 02/24/15, pre-admit testing appt on 1/10 @1 :15 and to call day prior to surgery for arrival time to SDS. Pt voices understanding & states she had surgery on 1/5 and may need to r/s. She will c/b if unable to keep appt.

## 2015-02-12 NOTE — Progress Notes (Signed)
[place] @ [facility] Telephone:(336) [phone]  Fax:(336) [phone]  Progress note  CLELLA MCKEEL OB: December 12, 1929  MR#: [id number]  NLG#:[id number]  Patient Care Team: Idelle Crouch, MD as PCP - General (Unknown Physician Specialty) [name], MD as Referring Physician (Surgery) [name], MD as Consulting Physician (Urology)  CHIEF COMPLAINT:  Chief Complaint  Patient presents with  . Endometrial Cancer  1/  VISIT DIAGNOSIS:     ICD-9-CM ICD-10-CM   1. Left leg swelling 729.81 M79.89 US Venous Img Lower Unilateral Left     CBC with Differential     Comprehensive metabolic panel     CA 081     DISCONTINUED: Rivaroxaban (XARELTO) 15 MG TABS tablet     DISCONTINUED: rivaroxaban (XARELTO) 20 MG TABS tablet  2. Endometrial cancer (HCC) 182.0 C54.1 US Venous Img Lower Unilateral Left     CBC with Differential     Comprehensive metabolic panel     CA 448     DISCONTINUED: Rivaroxaban (XARELTO) 15 MG TABS tablet     DISCONTINUED: rivaroxaban (XARELTO) 20 MG TABS tablet    Oncology History   1.  Carcinoma of left breast  IMPRESSION: Ultrasound-guided biopsy of a suspicious left breast mass at 9 o'clock. No apparent complications.   2.  Estrogen receptor positive.  Progesterone receptor positive.  HER-2 receptor 2+ by  IHC . Fish is pending  5 mm tumor clinically stage IB N0 M0 tumor   3.  Patient has a previous history of endometrium  carcinoma of endometrium stage IB disease status post bilateral salpingo-oophorectomy and radiation therapy 4.  Right hydronephrosis detected on MRI scan off lumbosacral spine.  Cystoscopy with attempted stent placement revealed bladder tumor in September of 2016 biopsies positive for recurrent endometrial cancer  5.  Patient had  stent placement in the right kidney Started radiation therapy 6.  Patient has finished radiation therapy with relief in the pain (November, 2016) 7.  Started on Megace in December of  2016  Oncology Flowsheet 04/08/2011 10/28/2014 02/10/2015  dexamethasone (DECADRON) IJ - - -  enoxaparin (LOVENOX) [place] 40 mg - 1 mg/kg  ondansetron (ZOFRAN) IV - - -    INTERVAL HISTORY:  82 year old lady came today further follow-up patient has 5 mm infiltrating ductal carcinoma of breast during the workup patient was found to have recurrent endometrial cancer with extensive retroperitoneal adenopathy and pelvic wall invasion and bladder invasion started on palliative radiation therapy  Patient has finished radiation therapy.  There is significant relief in the pain. Patient is here for ongoing evaluation and treatment consideration  Patient was taking Megace.  Pain is improved had a repeat CT scan. Patient  and family here to discuss the results of the CT scan and further evaluation and treatment        REVIEW OF SYSTEMS:   GENERAL:  Feels good.  Active.  No fevers, sweats or weight loss. PERFORMANCE STATUS (ECOG):  01 HEENT:  No visual changes, runny nose, sore throat, mouth sores or tenderness. Lungs: No shortness of breath or cough.  No hemoptysis. Cardiac:  No chest pain, palpitations, orthopnea, or PND. Right flank pain.  Right low back pain.  Right pain radiating down her right lower extremity.  Recent cystoscopy is consistent with bladder tumor.  Biopsies positive for endometrial cancer  Musculoskeletal:  No back pain.  No joint pain.  No muscle tenderness. Extremities:  No pain or swelling. Skin:  No rashes or skin changes. Neuro:  No  headache, numbness or weakness, balance or coordination issues. Endocrine:  No diabetes, thyroid issues, hot flashes or night sweats. Psych:  No mood changes, depression or anxiety. Pain: Significant relief in the pain Will try to taper off Patient from pain medication, monitoring pain  Review of systems:  All other systems reviewed and found to be negative.  As per HPI. Otherwise, a complete review of systems is negatve.  PAST MEDICAL  HISTORY: Past Medical History  Diagnosis Date  . History of esophageal stricture   . GERD (gastroesophageal reflux disease)   . Inguinal lymphocyst   . History of brachytherapy   . History of shingles   . History of colon polyps   . Mild aphasia   . Hearing loss   . History of DVT (deep vein thrombosis)   . Stroke ([place]) 04/06/2011    residual:  aphasia  . Hiccups   . Pain     SEVERE CHRONIC RIGHT LEG,HIP,FOOT  . Chronic kidney disease     HYDRONEPHROSIS  . Endometrial adenocarcinoma ([place]) 01/2012     stage Ib, grade 1, positive peritoneal cytology, no other risk factors or extra-uterine disease  . Cancer of left breast ([place]) 10/20/2014  . Bone cancer ([place])   . Arthritis     Degenerative Disc Disease  . Gastric ulcer     PAST SURGICAL HISTORY: Past Surgical History  Procedure Laterality Date  . Esophageal dilation    . Thyroidectomy, partial    . Cataract extraction w/ intraocular lens implant      right  . Back surgery      fusion of L3&4  . Tonsillectomy    . Total abdominal hysterectomy w/ bilateral salpingoophorectomy Bilateral     staging biopsies  . Breast cyst aspiration N/A   . Breast biopsy Left 10/13/2014    path pending  . Cystoscopy w/ retrogrades Right 10/28/2014    Procedure: CYSTOSCOPY WITH RETROGRADE PYELOGRAM ATTEMPT, UNABLE TO PASS ;  Surgeon: [name], MD;  Location: [facility] ORS;  Service: Urology;  Laterality: Right;  . Cystoscopy with stent placement Right 10/28/2014    Procedure: BLADDER BIOPSY WITH FULGERATION ;  Surgeon: [name], MD;  Location: [facility] ORS;  Service: Urology;  Laterality: Right;  . Thyroid removed    . Eye surgery Bilateral     Cataract Extraction  . Abdominal hysterectomy    . Portacath placement Right 11/26/2014    Procedure: INSERTION PORT-A-CATH;  Surgeon: [name], MD;  Location: [facility] ORS;  Service: General;  Laterality: Right;    FAMILY HISTORY Family History  Problem Relation Age of Onset  .  Stroke Mother   . Breast cancer Maternal Aunt     x 2        ADVANCED DIRECTIVES:  Patient does not have any living will or healthcare power of attorney.  Information was given .  Available resources had been discussed.  We will follow-up on subsequent appointments regarding this issue  HEALTH MAINTENANCE: Social History  Substance Use Topics  . Smoking status: Never Smoker   . Smokeless tobacco: Never Used  . Alcohol Use: No      Allergies  Allergen Reactions  . Penicillins Rash  . Hydrocodone-Acetaminophen Nausea Only  . Novocain [Procaine] Other (See Comments) and Nausea And Vomiting    Chest pain  . Sulfa Antibiotics Other (See Comments)    CHEST PAIN    Current Outpatient Prescriptions  Medication Sig Dispense Refill  . docusate sodium (COLACE) 100  MG capsule Take 100 mg by mouth 2 (two) times daily.    . fentaNYL (DURAGESIC - DOSED MCG/HR) 50 MCG/HR Place 1 patch (50 mcg total) onto the skin every 3 (three) days. 10 patch 0  . gabapentin (NEURONTIN) 300 MG capsule Take 1 capsule (300 mg total) by mouth 3 (three) times daily. 90 capsule 2  . gentamicin ointment (GARAMYCIN) 0.1 %     . megestrol (MEGACE) 40 MG tablet Take 1 tablet (40 mg total) by mouth 4 (four) times daily. 120 tablet 3  . meloxicam (MOBIC) 15 MG tablet Take 1 tablet (15 mg total) by mouth daily. 30 tablet 0  . Multiple Vitamins-Minerals (MULTIVITAMIN WITH MINERALS) tablet Take 1 tablet by mouth daily.    Marland Kitchen oxyCODONE-acetaminophen (PERCOCET/ROXICET) 5-325 MG tablet Take 1-2 tablets by mouth every 4 (four) hours as needed for severe pain. 90 tablet 0  . clopidogrel (PLAVIX) 75 MG tablet Take 75 mg by mouth daily.     No current facility-administered medications for this visit.    OBJECTIVE: PHYSICAL EXAM: GENERAL:  Well developed, well nourished, sitting comfortably in the exam room in no acute distress. MENTAL STATUS:  Alert and oriented to person, place and time.  ENT:  Oropharynx clear without  lesion.  Tongue normal. Mucous membranes moist.  RESPIRATORY:  Clear to auscultation without rales, wheezes or rhonchi. CARDIOVASCULAR:  Regular rate and rhythm without murmur, rub or gallop.  ABDOMEN:  Soft, non-tender, with active bowel sounds, and no hepatosplenomegaly.  No masses. BACK:   CVA tenderness.  No tenderness on percussion of the back or rib cage. SKIN:  No rashes, ulcers or lesions. EXTREMITIES: Left lower extremity edema LYMPH NODES: No palpable cervical, supraclavicular, axillary or inguinal adenopathy  NEUROLOGICAL: Unremarkable. PSYCH:  Appropriate.  Filed Vitals:   02/09/15 1208  BP: 132/78  Pulse: 58  Temp: 96.7 F (35.9 C)     Body mass index is 20.84 kg/(m^2).    ECOG FS:1 - Symptomatic but completely ambulatory  LAB RESULTS:  CBC Latest Ref Rng 02/10/2015 01/04/2015 12/24/2014  WBC 3.6 - 11.0 K/uL 5.0 4.8 5.6  Hemoglobin 12.0 - 16.0 g/dL 12.8 14.9 13.9  Hematocrit 35.0 - 47.0 % 38.5 43.8 40.9  Platelets 150 - 440 K/uL 260 299 265    STUDIES: Ct Abdomen Pelvis W Contrast  02/04/2015  CLINICAL DATA:  Left breast cancer, recent diagnosis. History of endometrial cancer in 2013, recurrent. EXAM: CT ABDOMEN AND PELVIS WITH CONTRAST TECHNIQUE: Multidetector CT imaging of the abdomen and pelvis was performed using the standard protocol following bolus administration of intravenous contrast. CONTRAST:  57m OMNIPAQUE IOHEXOL 300 MG/ML  SOLN COMPARISON:  11/09/2014 FINDINGS: Lower chest: Pectus excavatum, Haller index 3.1. Mild diffuse distal esophageal wall thickening. Hepatobiliary: Stably diminutive left hepatic lobe. Appendix unremarkable. Pancreas: Stable slight prominence of dorsal pancreatic duct. Spleen: Unremarkable Adrenals/Urinary Tract: Atrophic right kidney, double-J ureteral stent on the right with loops an appropriate positioning. Mild nodularity of the right adrenal gland. Fullness of the right collecting system without hydronephrosis. Poor excretion of the  right kidney compared to the left. Small left peripelvic cysts. Stomach/Bowel: Sigmoid diverticulosis. Vascular/Lymphatic: 2.8 by 1.1 cm soft tissue density along the left pelvic sidewall, I doubt this is ovary given its morphology, previously 2.6 by 1.1 cm, suspicious for tumor. 1.3 cm soft tissue nodule in the upper perirectal space, image 57 series 2, previously 1.5 cm. Reproductive: Uterus absent. Mild thickening of the left vaginal cuff, 0.9 by 1.4 cm, formerly 1.1 by  1.5 cm, possibly a tumor deposit. Other: 1.1 by 1.0 cm tumor deposit along the lower margin of the left paracolic gutter, image 50 series 2, formerly 2.0 by 1.3 cm. Musculoskeletal: Large destructive right iliac bone mass with extension into the right iloipsoas muscle and right gluteus minimus and medius muscles, tumor approximately 6.5 by 5.5 cm, formerly 5.8 by 5.3 cm by my measurements of, with tumor tracking adjacent to the sacral plexus and upper sciatic nerve similar to prior. Posterolateral rod and pedicle screw fixation at L3-4 bilaterally. Degenerative loss of intervertebral disc height at L5-S1. Levoconvex thoracolumbar scoliosis. IMPRESSION: 1. Mixed response, with some of the tumor deposits smaller (such as the tumor deposit along the lower margin of the left paracolic gutter), but with enlargement of the destructive right iliac bone metastatic lesion with extensive extraosseous extension. The posterior margin of this latter lesion may be impinging upon the right sacral plexus. 2. Other suspected tumor deposits along the left pelvic sidewall, in the upper perirectal space, and along the left side of the vaginal cuff, generally similar size to prior. 3. Placement of a double-J ureteral stent on the right, with atrophic right kidney. 4. Other imaging findings of potential clinical significance: Pectus excavatum. Distal esophageal wall thickening suggesting esophagitis. Sigmoid diverticulosis. Electronically Signed   By: Van Clines M.D.   On: 02/04/2015 13:06   US Venous Img Lower Unilateral Left  02/09/2015  CLINICAL DATA:  Left lower extremity swelling. EXAM: LEFT LOWER EXTREMITY VENOUS DOPPLER ULTRASOUND TECHNIQUE: Gray-scale sonography with graded compression, as well as color Doppler and duplex ultrasound were performed to evaluate the lower extremity deep venous systems from the level of the common femoral vein and including the common femoral, femoral, profunda femoral, popliteal and calf veins including the posterior tibial, peroneal and gastrocnemius veins when visible. The superficial great saphenous vein was also interrogated. Spectral Doppler was utilized to evaluate flow at rest and with distal augmentation maneuvers in the common femoral, femoral and popliteal veins. COMPARISON:  None. FINDINGS: Contralateral Common Femoral Vein: Respiratory phasicity is normal and symmetric with the symptomatic side. No evidence of thrombus. Normal compressibility. Common Femoral Vein: No evidence of thrombus. Normal compressibility, respiratory phasicity and response to augmentation. Saphenofemoral Junction: No evidence of thrombus. Normal compressibility and flow on color Doppler imaging. Profunda Femoral Vein: No evidence of thrombus. Normal compressibility and flow on color Doppler imaging. Femoral Vein: Occlusive thrombus in the mid and distal left femoral vein. Popliteal Vein: Nonocclusive thrombus the popliteal vein. Calf Veins: No evidence of thrombus. Normal compressibility and flow on color Doppler imaging. Peroneal veins not visualized. Superficial Great Saphenous Vein: No evidence of thrombus. Normal compressibility and flow on color Doppler imaging. Venous Reflux:  None. Other Findings:  None. IMPRESSION: Positive study for deep venous thrombosis. Electronically Signed   By: Marcello Moores  Register   On: 02/09/2015 15:12    ASSESSMENT:  Carcinoma of left breast at 9:00 position clinically stage is T1b N0 M0 tumor estrogen  receptor positive progesterone receptor positive HER-2/neu receptor pending by fish Breast cancer surgery has been put on hold because of diagnosis of endometrial cancer 2.  Recurrent endometrial cancer Right hydronephrosis status post stent placement Pain has improved.  We will try to decrease fentanyl patch Hydronephrosis is being followed by urologist.  Change in the stent would be considered.  CT scan has been reviewed independently sows significant response.  I reviewed urologist note and will arrange for changing the stent. Left lower extremity swelling which is  new and being on Megace and raises the possibility of deep vein thrombosis  So ultrasound and Doppler study of the left lower extremity was ordered . I was called by radiology Department and that Doppler study was consistent with deep vein thrombosis. Patient was started on xeralto  Total duration of visit was 45 minutes.  50% or more time was spent in counseling patient and family regarding prognosis and options of treatment and available resources explaining the patient results of the CT scan as well as deep vein thrombosis and advised to stop Megace and Plavix

## 2015-02-16 ENCOUNTER — Encounter
Admission: RE | Admit: 2015-02-16 | Discharge: 2015-02-16 | Disposition: A | Discharge status: Home / Self Care | Payer: Medicare Other | Source: Ambulatory Visit | Attending: Urology | Admitting: Urology

## 2015-02-16 DIAGNOSIS — Z01812 Encounter for preprocedural laboratory examination: Secondary | ICD-10-CM | POA: Diagnosis present

## 2015-02-16 HISTORY — DX: Acute embolism and thrombosis of unspecified deep veins of left lower extremity: I82.402

## 2015-02-16 LAB — CBC
HCT: 40.3 % (ref 35.0–47.0)
Hemoglobin: 13.5 g/dL (ref 12.0–16.0)
MCH: 31.3 pg (ref 26.0–34.0)
MCHC: 33.4 g/dL (ref 32.0–36.0)
MCV: 93.6 fL (ref 80.0–100.0)
PLATELETS: 262 10*3/uL (ref 150–440)
RBC: 4.31 MIL/uL (ref 3.80–5.20)
RDW: 14.2 % (ref 11.5–14.5)
WBC: 6.1 10*3/uL (ref 3.6–11.0)

## 2015-02-16 LAB — DIFFERENTIAL
BASOS ABS: 0.1 10*3/uL (ref 0–0.1)
BASOS PCT: 1 %
EOS ABS: 0.2 10*3/uL (ref 0–0.7)
Eosinophils Relative: 4 %
Lymphocytes Relative: 11 %
Lymphs Abs: 0.7 10*3/uL — ABNORMAL LOW (ref 1.0–3.6)
MONO ABS: 0.5 10*3/uL (ref 0.2–0.9)
MONOS PCT: 9 %
NEUTROS ABS: 4.6 10*3/uL (ref 1.4–6.5)
Neutrophils Relative %: 75 %

## 2015-02-16 LAB — BASIC METABOLIC PANEL
Anion gap: 9 (ref 5–15)
BUN: 30 mg/dL — AB (ref 6–20)
CALCIUM: 9.2 mg/dL (ref 8.9–10.3)
CO2: 22 mmol/L (ref 22–32)
Chloride: 108 mmol/L (ref 101–111)
Creatinine, Ser: 1.11 mg/dL — ABNORMAL HIGH (ref 0.44–1.00)
GFR calc Af Amer: 52 mL/min — ABNORMAL LOW (ref >60)
GFR, EST NON AFRICAN AMERICAN: 45 mL/min — AB (ref >60)
GLUCOSE: 96 mg/dL (ref 65–99)
POTASSIUM: 4.2 mmol/L (ref 3.5–5.1)
SODIUM: 139 mmol/L (ref 135–145)

## 2015-02-16 NOTE — Patient Instructions (Signed)
  Your procedure is scheduled on: February 24, 2015 (Wednesday) Report to Day Surgery.[facility]) Second Floor To find out your arrival time please call [phone] between 1PM - 3PM on February 23, 2015 (Tuesday).  Remember: Instructions that are not followed completely may result in serious medical risk, up to and including death, or upon the discretion of your surgeon and anesthesiologist your surgery may need to be rescheduled.    __x__ 1. Do not eat food or drink liquids after midnight. No gum chewing or hard candies.     ____ 2. No Alcohol for 24 hours before or after surgery.   ____ 3. Bring all medications with you on the day of surgery if instructed.    __x__ 4. Notify your doctor if there is any change in your medical condition     (cold, fever, infections).     Do not wear jewelry, make-up, hairpins, clips or nail polish.  Do not wear lotions, powders, or perfumes. You may wear deodorant.  Do not shave 48 hours prior to surgery. Men may shave face and neck.  Do not bring valuables to the hospital.    [facility] is not responsible for any belongings or valuables.               Contacts, dentures or bridgework may not be worn into surgery.  Leave your suitcase in the car. After surgery it may be brought to your room.  For patients admitted to the hospital, discharge time is determined by your                treatment team.   Patients discharged the day of surgery will not be allowed to drive home.   Please read over the following fact sheets that you were given:   Surgical Site Infection Prevention   ____ Take these medicines the morning of surgery with A SIP OF WATER:    1. Gabapentin  2.   3.   4.  5.  6.  ____ Fleet Enema (as directed)   ____ Use CHG Soap as directed  ____ Use inhalers on the day of surgery  ____ Stop metformin 2 days prior to surgery    ____ Take 1/2 of usual insulin dose the night before surgery and none on the morning of surgery.    __x__ Stop Coumadin/Plavix/aspirin on (Dr. Pilar Jarvis office notified by PAT nurse to inform patient when to stop Plavix)  __x__ Stop Anti-inflammatories on (Stop Meloxicam now)   ____ Stop supplements until after surgery.    ____ Bring C-Pap to the hospital.

## 2015-02-16 NOTE — Pre-Procedure Instructions (Signed)
Medical and Vascular clearance faxed to Dr. Pilar Jarvis office per Dr. Ronelle Nigh request. (spoke to [facility] and Lattie Haw)

## 2015-02-17 ENCOUNTER — Telehealth: Payer: Self-pay | Admitting: *Deleted

## 2015-02-17 NOTE — Telephone Encounter (Signed)
Filter placed Friday through right groin, she awoke Saturday with horrible right hip pain and Called Dr Ozella Almond office who said it was nothing they did and she needs to call Dr Oliva Bustard. She reports that her current pain med is not helping her pain and would like to come in to be seen today if at all possible

## 2015-02-17 NOTE — Telephone Encounter (Signed)
Double up on pain pills a few times today and keep appt tomorrow. Pt informed of Dr Oliva Bustard instructions and repeated back to me

## 2015-02-18 ENCOUNTER — Encounter: Payer: Self-pay | Admitting: Oncology

## 2015-02-18 ENCOUNTER — Inpatient Hospital Stay (HOSPITAL_BASED_OUTPATIENT_CLINIC_OR_DEPARTMENT_OTHER): Payer: Medicare Other | Admitting: Oncology

## 2015-02-18 ENCOUNTER — Telehealth: Payer: Self-pay | Admitting: Radiology

## 2015-02-18 VITALS — BP 138/79 | HR 80 | Temp 98.3°F | Resp 18 | Wt 120.8 lb

## 2015-02-18 DIAGNOSIS — C7951 Secondary malignant neoplasm of bone: Secondary | ICD-10-CM

## 2015-02-18 DIAGNOSIS — C541 Malignant neoplasm of endometrium: Secondary | ICD-10-CM | POA: Diagnosis not present

## 2015-02-18 DIAGNOSIS — I639 Cerebral infarction, unspecified: Secondary | ICD-10-CM | POA: Insufficient documentation

## 2015-02-18 DIAGNOSIS — Z8542 Personal history of malignant neoplasm of other parts of uterus: Secondary | ICD-10-CM

## 2015-02-18 DIAGNOSIS — N1330 Unspecified hydronephrosis: Secondary | ICD-10-CM

## 2015-02-18 DIAGNOSIS — Z7902 Long term (current) use of antithrombotics/antiplatelets: Secondary | ICD-10-CM

## 2015-02-18 DIAGNOSIS — C50812 Malignant neoplasm of overlapping sites of left female breast: Secondary | ICD-10-CM

## 2015-02-18 DIAGNOSIS — C7911 Secondary malignant neoplasm of bladder: Secondary | ICD-10-CM

## 2015-02-18 DIAGNOSIS — R319 Hematuria, unspecified: Secondary | ICD-10-CM

## 2015-02-18 DIAGNOSIS — M25551 Pain in right hip: Secondary | ICD-10-CM

## 2015-02-18 DIAGNOSIS — Z170 Estrogen receptor positive status [ER+]: Secondary | ICD-10-CM

## 2015-02-18 DIAGNOSIS — C50912 Malignant neoplasm of unspecified site of left female breast: Secondary | ICD-10-CM

## 2015-02-18 DIAGNOSIS — Z7901 Long term (current) use of anticoagulants: Secondary | ICD-10-CM

## 2015-02-18 DIAGNOSIS — I82432 Acute embolism and thrombosis of left popliteal vein: Secondary | ICD-10-CM

## 2015-02-18 LAB — URINE CULTURE

## 2015-02-18 NOTE — Telephone Encounter (Signed)
Advised pt to hold Plavix beginning now until after surgery per Dr Doy Hutching instruction. Will plan to proceed with surgery on 02/24/15 as scheduled. Pt voices understanding.

## 2015-02-19 ENCOUNTER — Ambulatory Visit (INDEPENDENT_AMBULATORY_CARE_PROVIDER_SITE_OTHER): Payer: Medicare Other

## 2015-02-19 DIAGNOSIS — N390 Urinary tract infection, site not specified: Secondary | ICD-10-CM

## 2015-02-19 LAB — URINALYSIS, COMPLETE
Bilirubin, UA: NEGATIVE
Glucose, UA: NEGATIVE
Nitrite, UA: NEGATIVE
PH UA: 5.5 (ref 5.0–7.5)
Specific Gravity, UA: 1.025 (ref 1.005–1.030)
Urobilinogen, Ur: 0.2 mg/dL (ref 0.2–1.0)

## 2015-02-19 LAB — MICROSCOPIC EXAMINATION: RBC, UA: >30 /hpf — ABNORMAL HIGH (ref 0–?)

## 2015-02-19 NOTE — Progress Notes (Signed)
In and Out Catheterization  Patient is present today for a I & O catheterization due to ucx prior to surgery. Patient was cleaned and prepped in a sterile fashion with betadine and Lidocaine 2% jelly was instilled into the urethra.  A 14FR cath was inserted no complications were noted , 52ml of urine return was noted, urine was yellow in color. A clean urine sample was collected for ua and ucx. Bladder was drained  And catheter was removed with out difficulty.    Preformed by: Toniann Fail, LPN   Follow up/ Additional notes: Pt was not able to give an appropriate clean catch prior to surgery. Therefore a cath specimen was obtained.

## 2015-02-20 ENCOUNTER — Encounter: Payer: Self-pay | Admitting: Oncology

## 2015-02-20 NOTE — Progress Notes (Signed)
[place] @ [facility] [phone]  [phone]  Progress note  Traci [name] OB: [birth year]  MR#: [id number]  CWC#:[id number]  Patient Care Team: Idelle Crouch, MD as PCP - General (Unknown Physician Specialty) [name], MD as Referring Physician (Surgery) [name], MD as Consulting Physician (Urology)  CHIEF COMPLAINT:  Chief Complaint  Patient presents with  . Endometrial Cancer  1/  VISIT DIAGNOSIS:     ICD-9-CM ICD-10-CM   1. Malignant neoplasm of left female breast, unspecified site of breast ([place]) 174.9 C50.912 CBC with Differential     Comprehensive metabolic panel    Oncology History   1.  Carcinoma of left breast  IMPRESSION: Ultrasound-guided biopsy of a suspicious left breast mass at 9 o'clock. No apparent complications.   2.  Estrogen receptor positive.  Progesterone receptor positive.  HER-2 receptor 2+ by  I[facility] . Fish is pending  5 mm tumor clinically stage IB N0 M0 tumor   3.  Patient has a previous history of endometrium  carcinoma of endometrium stage IB disease status post bilateral salpingo-oophorectomy and radiation therapy 4.  Right hydronephrosis detected on MRI scan off lumbosacral spine.  Cystoscopy with attempted stent placement revealed bladder tumor in September of 2016 biopsies positive for recurrent endometrial cancer  5.  Patient had  stent placement in the right kidney Started radiation therapy 6.  Patient has finished radiation therapy with relief in the pain (November, 2016) 7.  Started on Megace in December of 2016 8 deep vein thrombosis in the left lower extremity (February 08, 2014) Hematuria due to   xeralto IVC filter placement on February 10, 2014 She  is off xeralto  Oncology Flowsheet 04/08/2011 10/28/2014 02/10/2015  dexamethasone (DECADRON) IJ - - -  enoxaparin (LOVENOX) [place] 40 mg - 1 mg/kg  ondansetron (ZOFRAN) IV - - -    INTERVAL HISTORY:  82 year old lady came today further follow-up  patient has 5 mm infiltrating ductal carcinoma of breast during the workup patient was found to have recurrent endometrial cancer with extensive retroperitoneal adenopathy and pelvic wall invasion and bladder invasion started on palliative radiation therapy  During evaluation of third January patient was found to have deep vein thrombosis.  Patient was started on xeralto.  I received a phone call on fourth January after known the patient started having multiple episodes of hematuria.  As hematuria continued patient was referred to emergency room where patient received at shot of Lovenox.  This morning patient is now being evaluated.  Hematuria continues.  No abdominal pain no nausea no vomiting swelling in the left lower extremity is improved. Patient had IVC filter placed.  She called yesterday that she is having increasing and excruciating pain in the right hip.  C: Latex that pain with IVC filter as it was placed in the right groin pain is in the right hip area.  Patient came today further follow-up according to her pain has improved significantly.  He is radiating down to the right lower extremity. We have contacted urologist's office for changing the stent and that is being planned next week.  Patient will be off Plavix for a few days prior to procedure        REVIEW OF SYSTEMS:   GENERAL:  Feels good.  Active.  No fevers, sweats or weight loss. PERFORMANCE STATUS (ECOG):  01 HEENT:  No visual changes, runny nose, sore throat, mouth sores or tenderness. Lungs: No shortness of breath or cough.  No hemoptysis.  Cardiac:  No chest pain, palpitations, orthopnea, or PND. Right flank pain.  Right low back pain.  Right pain radiating down her right lower extremity.  Recent cystoscopy is consistent with bladder tumor.  Biopsies positive for endometrial cancer  Musculoskeletal:  No back pain.  No joint pain.  No muscle tenderness. Extremities:  No pain or swelling. Skin:  No rashes or skin  changes. Neuro:  No headache, numbness or weakness, balance or coordination issues. Endocrine:  No diabetes, thyroid issues, hot flashes or night sweats. Psych:  No mood changes, depression or anxiety. Pain: Significant relief in the pain Will try to taper off Patient from pain medication, monitoring pain  Review of systems:  All other systems reviewed and found to be negative.  As per HPI. Otherwise, a complete review of systems is negatve.  PAST MEDICAL HISTORY: Past Medical History  Diagnosis Date  . History of esophageal stricture   . GERD (gastroesophageal reflux disease)   . Inguinal lymphocyst   . History of brachytherapy   . History of shingles   . History of colon polyps   . Mild aphasia   . Hearing loss   . History of DVT (deep vein thrombosis)   . Stroke ([place]) 04/06/2011    residual:  aphasia  . Hiccups   . Pain     SEVERE CHRONIC RIGHT LEG,HIP,FOOT  . Chronic kidney disease     HYDRONEPHROSIS  . Endometrial adenocarcinoma ([place]) 01/2012     stage Ib, grade 1, positive peritoneal cytology, no other risk factors or extra-uterine disease  . Cancer of left breast ([place]) 10/20/2014  . Bone cancer ([place])   . Arthritis     Degenerative Disc Disease  . Gastric ulcer   . Deep vein blood clot of left lower extremity ([place]) February 11, 2015    Left Leg    PAST SURGICAL HISTORY: Past Surgical History  Procedure Laterality Date  . Esophageal dilation    . Thyroidectomy, partial    . Cataract extraction w/ intraocular lens implant      right  . Back surgery      fusion of L3&4  . Tonsillectomy    . Total abdominal hysterectomy w/ bilateral salpingoophorectomy Bilateral     staging biopsies  . Breast cyst aspiration N/A   . Breast biopsy Left 10/13/2014    path pending  . Cystoscopy w/ retrogrades Right 10/28/2014    Procedure: CYSTOSCOPY WITH RETROGRADE PYELOGRAM ATTEMPT, UNABLE TO PASS ;  Surgeon: [name], MD;  Location: [facility] ORS;  Service: Urology;   Laterality: Right;  . Cystoscopy with stent placement Right 10/28/2014    Procedure: BLADDER BIOPSY WITH FULGERATION ;  Surgeon: [name], MD;  Location: [facility] OR[facility];  Service: Urology;  Laterality: Right;  . Thyroid removed    . Eye surgery Bilateral     Cataract Extraction  . Portacath placement Right 11/26/2014    Procedure: INSERTION PORT-A-CATH;  Surgeon: [name], MD;  Location: [facility] ORS;  Service: General;  Laterality: Right;  . Peripheral vascular catheterization N/A 02/11/2015    Procedure: IVC Filter Insertion;  Surgeon: Algernon Huxley, MD;  Location: [place] [facility];  Service: Cardiovascular;  Laterality: N/A;  . Abdominal hysterectomy  December 2013    history of cervical cancer    FAMILY HISTORY Family History  Problem Relation Age of Onset  . Stroke Mother   . Breast cancer Maternal Aunt     x 2  ADVANCED DIRECTIVES:  Patient does not have any living will or healthcare power of attorney.  Information was given .  Available resources had been discussed.  We will follow-up on subsequent appointments regarding this issue  HEALTH MAINTENANCE: Social History  Substance Use Topics  . Smoking status: Never Smoker   . Smokeless tobacco: Never Used  . Alcohol Use: No      Allergies  Allergen Reactions  . Penicillins Rash  . Hydrocodone-Acetaminophen Nausea Only  . Novocain [Procaine] Other (See Comments) and Nausea And Vomiting    Chest pain  . Sulfa Antibiotics Other (See Comments)    CHEST PAIN    Current Outpatient Prescriptions  Medication Sig Dispense Refill  . clopidogrel (PLAVIX) 75 MG tablet Take 75 mg by mouth daily.    Marland Kitchen docusate sodium (COLACE) 100 MG capsule Take 100 mg by mouth 2 (two) times daily as needed.     . fentaNYL (DURAGESIC - DOSED MCG/HR) 50 MCG/HR Place 1 patch (50 mcg total) onto the skin every 3 (three) days. 10 patch 0  . gabapentin (NEURONTIN) 300 MG capsule Take 1 capsule (300 mg total) by mouth 3 (three)  times daily. 90 capsule 2  . gentamicin ointment (GARAMYCIN) 0.1 %     . megestrol (MEGACE) 40 MG tablet Take 1 tablet (40 mg total) by mouth 4 (four) times daily. 120 tablet 3  . Multiple Vitamins-Minerals (MULTIVITAMIN WITH MINERALS) tablet Take 1 tablet by mouth daily.    Marland Kitchen oxyCODONE-acetaminophen (PERCOCET/ROXICET) 5-325 MG tablet Take 1-2 tablets by mouth every 4 (four) hours as needed for severe pain. 90 tablet 0   No current facility-administered medications for this visit.    OBJECTIVE: PHYSICAL EXAM: GENERAL:  Well developed, well nourished, sitting comfortably in the exam room in no acute distress. MENTAL STATUS:  Alert and oriented to person, place and time.  ENT:  Oropharynx clear without lesion.  Tongue normal. Mucous membranes moist.  RESPIRATORY:  Clear to auscultation without rales, wheezes or rhonchi. CARDIOVASCULAR:  Regular rate and rhythm without murmur, rub or gallop.  ABDOMEN:  Soft, non-tender, with active bowel sounds, and no hepatosplenomegaly.  No masses. BACK:   CVA tenderness.  No tenderness on percussion of the back or rib cage. SKIN:  No rashes, ulcers or lesions. EXTREMITIES: Left lower extremity edema LYMPH NODES: No palpable cervical, supraclavicular, axillary or inguinal adenopathy  NEUROLOGICAL: Unremarkable. PSYCH:  Appropriate.  Filed Vitals:   02/18/15 1148  BP: 138/79  Pulse: 80  Temp: 98.3 F (36.8 C)  Resp: 18     Body mass index is 20.73 kg/(m^2).    ECOG FS:1 - Symptomatic but completely ambulatory  LAB RESULTS:  CBC Latest Ref Rng 02/16/2015 02/10/2015 01/04/2015  WBC 3.6 - 11.0 K/uL 6.1 5.0 4.8  Hemoglobin 12.0 - 16.0 g/dL 62.7 72.8 93.2  Hematocrit 35.0 - 47.0 % 40.3 38.5 43.8  Platelets 150 - 440 K/uL 262 260 299    STUDIES: Ct Abdomen Pelvis W Contrast  02/04/2015  CLINICAL DATA:  Left breast cancer, recent diagnosis. History of endometrial cancer in 2013, recurrent. EXAM: CT ABDOMEN AND PELVIS WITH CONTRAST TECHNIQUE:  Multidetector CT imaging of the abdomen and pelvis was performed using the standard protocol following bolus administration of intravenous contrast. CONTRAST:  40mL OMNIPAQUE IOHEXOL 300 MG/ML  SOLN COMPARISON:  11/09/2014 FINDINGS: Lower chest: Pectus excavatum, Haller index 3.1. Mild diffuse distal esophageal wall thickening. Hepatobiliary: Stably diminutive left hepatic lobe. Appendix unremarkable. Pancreas: Stable slight prominence of dorsal pancreatic duct.  Spleen: Unremarkable Adrenals/Urinary Tract: Atrophic right kidney, double-J ureteral stent on the right with loops an appropriate positioning. Mild nodularity of the right adrenal gland. Fullness of the right collecting system without hydronephrosis. Poor excretion of the right kidney compared to the left. Small left peripelvic cysts. Stomach/Bowel: Sigmoid diverticulosis. Vascular/Lymphatic: 2.8 by 1.1 cm soft tissue density along the left pelvic sidewall, I doubt this is ovary given its morphology, previously 2.6 by 1.1 cm, suspicious for tumor. 1.3 cm soft tissue nodule in the upper perirectal space, image 57 series 2, previously 1.5 cm. Reproductive: Uterus absent. Mild thickening of the left vaginal cuff, 0.9 by 1.4 cm, formerly 1.1 by 1.5 cm, possibly a tumor deposit. Other: 1.1 by 1.0 cm tumor deposit along the lower margin of the left paracolic gutter, image 50 series 2, formerly 2.0 by 1.3 cm. Musculoskeletal: Large destructive right iliac bone mass with extension into the right iloipsoas muscle and right gluteus minimus and medius muscles, tumor approximately 6.5 by 5.5 cm, formerly 5.8 by 5.3 cm by my measurements of, with tumor tracking adjacent to the sacral plexus and upper sciatic nerve similar to prior. Posterolateral rod and pedicle screw fixation at L3-4 bilaterally. Degenerative loss of intervertebral disc height at L5-S1. Levoconvex thoracolumbar scoliosis. IMPRESSION: 1. Mixed response, with some of the tumor deposits smaller (such as  the tumor deposit along the lower margin of the left paracolic gutter), but with enlargement of the destructive right iliac bone metastatic lesion with extensive extraosseous extension. The posterior margin of this latter lesion may be impinging upon the right sacral plexus. 2. Other suspected tumor deposits along the left pelvic sidewall, in the upper perirectal space, and along the left side of the vaginal cuff, generally similar size to prior. 3. Placement of a double-J ureteral stent on the right, with atrophic right kidney. 4. Other imaging findings of potential clinical significance: Pectus excavatum. Distal esophageal wall thickening suggesting esophagitis. Sigmoid diverticulosis. Electronically Signed   By: Gaylyn Rong M.D.   On: 02/04/2015 13:06   US Venous Img Lower Unilateral Left  02/09/2015  CLINICAL DATA:  Left lower extremity swelling. EXAM: LEFT LOWER EXTREMITY VENOUS DOPPLER ULTRASOUND TECHNIQUE: Gray-scale sonography with graded compression, as well as color Doppler and duplex ultrasound were performed to evaluate the lower extremity deep venous systems from the level of the common femoral vein and including the common femoral, femoral, profunda femoral, popliteal and calf veins including the posterior tibial, peroneal and gastrocnemius veins when visible. The superficial great saphenous vein was also interrogated. Spectral Doppler was utilized to evaluate flow at rest and with distal augmentation maneuvers in the common femoral, femoral and popliteal veins. COMPARISON:  None. FINDINGS: Contralateral Common Femoral Vein: Respiratory phasicity is normal and symmetric with the symptomatic side. No evidence of thrombus. Normal compressibility. Common Femoral Vein: No evidence of thrombus. Normal compressibility, respiratory phasicity and response to augmentation. Saphenofemoral Junction: No evidence of thrombus. Normal compressibility and flow on color Doppler imaging. Profunda Femoral Vein:  No evidence of thrombus. Normal compressibility and flow on color Doppler imaging. Femoral Vein: Occlusive thrombus in the mid and distal left femoral vein. Popliteal Vein: Nonocclusive thrombus the popliteal vein. Calf Veins: No evidence of thrombus. Normal compressibility and flow on color Doppler imaging. Peroneal veins not visualized. Superficial Great Saphenous Vein: No evidence of thrombus. Normal compressibility and flow on color Doppler imaging. Venous Reflux:  None. Other Findings:  None. IMPRESSION: Positive study for deep venous thrombosis. Electronically Signed   By: Maisie Fus  Register  On: 02/09/2015 15:12    ASSESSMENT:  Carcinoma of left breast at 9:00 position clinically stage is T1b N0 M0 tumor estrogen receptor positive progesterone receptor positive HER-2/neu receptor pending by fish Breast cancer surgery has been put on hold because of diagnosis of endometrial cancer 2.  Recurrent endometrial cancer 3.  Deep vein thrombosis.  Patient was started on   xeralto  and Plavix was discontinued.  She started multiple episodes of significant hematuria. S/p IVC filter.placement  4.  Acute onset of right hip pain.  Patient Colace that pain with the IVC filter placement Has improved significantly There is no redness tenderness or cellulitis or infection at the site of the filter placement Continue  observation Patient will be reevaluated after stent is exchanged Patient will be on small dose   eliquis after stent is changed

## 2015-02-22 ENCOUNTER — Telehealth: Payer: Self-pay

## 2015-02-22 ENCOUNTER — Telehealth: Payer: Self-pay | Admitting: Radiology

## 2015-02-22 DIAGNOSIS — N390 Urinary tract infection, site not specified: Secondary | ICD-10-CM

## 2015-02-22 LAB — CULTURE, URINE COMPREHENSIVE

## 2015-02-22 MED ORDER — CIPROFLOXACIN HCL 500 MG PO TABS: 500.0000 mg | ORAL_TABLET | Freq: Two times a day (BID) | ORAL | Status: AC

## 2015-02-22 NOTE — Telephone Encounter (Signed)
Per Dr Carlynn Purl instruction, Cipro 500mg  bid x 7 days was sent to [facility] on [address]. Pt notified of positive ucx and prescription. Pt states she will not take medication without Dr Metro Kung approval.  Approval was obtained from Dr Paulino Door. Per Dr Pilar Jarvis surgery scheduled for 1/18 should be rescheduled. Pt voices understanding. States she will pick up prescription morning of 02/23/15.

## 2015-02-22 NOTE — Pre-Procedure Instructions (Signed)
[place] MODERATE BUT ACCEPTABLE RISK 02/18/15

## 2015-02-22 NOTE — Telephone Encounter (Signed)
Per Dr. Pilar Jarvis pt needs cipro before surgery can be done.

## 2015-02-23 ENCOUNTER — Telehealth: Payer: Self-pay | Admitting: *Deleted

## 2015-02-23 ENCOUNTER — Ambulatory Visit: Payer: Medicare Other | Admitting: Oncology

## 2015-02-23 MED ORDER — OXYCODONE-ACETAMINOPHEN 5-325 MG PO TABS: 1.0000 | ORAL_TABLET | ORAL | Status: DC | PRN

## 2015-02-23 NOTE — Telephone Encounter (Signed)
Informed that prescription is ready to pick up  

## 2015-02-23 NOTE — Telephone Encounter (Signed)
Pt notified of surgery r/s to 03/10/15, to stop Plavix prior to surgery with last dose to be taken on 03/02/15, and to call day prior to surgery for arrival time to SDS. Pt voices understanding. Pt notified of repeat ucx to be done after she finishes taking cipro. appt made & pt aware.

## 2015-03-02 ENCOUNTER — Inpatient Hospital Stay: Payer: Medicare Other

## 2015-03-02 ENCOUNTER — Encounter: Payer: Self-pay | Admitting: Oncology

## 2015-03-02 ENCOUNTER — Inpatient Hospital Stay (HOSPITAL_BASED_OUTPATIENT_CLINIC_OR_DEPARTMENT_OTHER): Payer: Medicare Other | Admitting: Oncology

## 2015-03-02 VITALS — BP 126/70 | HR 71 | Temp 97.8°F | Resp 18 | Wt 124.6 lb

## 2015-03-02 DIAGNOSIS — C7911 Secondary malignant neoplasm of bladder: Secondary | ICD-10-CM | POA: Diagnosis not present

## 2015-03-02 DIAGNOSIS — M545 Low back pain: Secondary | ICD-10-CM

## 2015-03-02 DIAGNOSIS — C7951 Secondary malignant neoplasm of bone: Secondary | ICD-10-CM

## 2015-03-02 DIAGNOSIS — N189 Chronic kidney disease, unspecified: Secondary | ICD-10-CM

## 2015-03-02 DIAGNOSIS — Z7901 Long term (current) use of anticoagulants: Secondary | ICD-10-CM

## 2015-03-02 DIAGNOSIS — N1330 Unspecified hydronephrosis: Secondary | ICD-10-CM

## 2015-03-02 DIAGNOSIS — M7989 Other specified soft tissue disorders: Secondary | ICD-10-CM

## 2015-03-02 DIAGNOSIS — Z8542 Personal history of malignant neoplasm of other parts of uterus: Secondary | ICD-10-CM | POA: Diagnosis not present

## 2015-03-02 DIAGNOSIS — C50912 Malignant neoplasm of unspecified site of left female breast: Secondary | ICD-10-CM

## 2015-03-02 DIAGNOSIS — C50812 Malignant neoplasm of overlapping sites of left female breast: Secondary | ICD-10-CM

## 2015-03-02 DIAGNOSIS — Z902 Acquired absence of lung [part of]: Secondary | ICD-10-CM

## 2015-03-02 DIAGNOSIS — C541 Malignant neoplasm of endometrium: Secondary | ICD-10-CM

## 2015-03-02 DIAGNOSIS — Z170 Estrogen receptor positive status [ER+]: Secondary | ICD-10-CM

## 2015-03-02 DIAGNOSIS — I82432 Acute embolism and thrombosis of left popliteal vein: Secondary | ICD-10-CM

## 2015-03-02 LAB — COMPREHENSIVE METABOLIC PANEL
ALBUMIN: 3.8 g/dL (ref 3.5–5.0)
ALT: 12 U/L — ABNORMAL LOW (ref 14–54)
ANION GAP: 6 (ref 5–15)
AST: 16 U/L (ref 15–41)
Alkaline Phosphatase: 46 U/L (ref 38–126)
BUN: 30 mg/dL — ABNORMAL HIGH (ref 6–20)
CO2: 24 mmol/L (ref 22–32)
Calcium: 8.8 mg/dL — ABNORMAL LOW (ref 8.9–10.3)
Chloride: 108 mmol/L (ref 101–111)
Creatinine, Ser: 1.27 mg/dL — ABNORMAL HIGH (ref 0.44–1.00)
GFR calc non Af Amer: 38 mL/min — ABNORMAL LOW (ref >60)
GFR, EST AFRICAN AMERICAN: 44 mL/min — AB (ref >60)
GLUCOSE: 108 mg/dL — AB (ref 65–99)
POTASSIUM: 3.6 mmol/L (ref 3.5–5.1)
SODIUM: 138 mmol/L (ref 135–145)
Total Bilirubin: 0.5 mg/dL (ref 0.3–1.2)
Total Protein: 6.7 g/dL (ref 6.5–8.1)

## 2015-03-02 LAB — CBC WITH DIFFERENTIAL/PLATELET
BASOS PCT: 1 %
Basophils Absolute: 0 10*3/uL (ref 0–0.1)
EOS ABS: 0.2 10*3/uL (ref 0–0.7)
EOS PCT: 4 %
HCT: 39.1 % (ref 35.0–47.0)
Hemoglobin: 13.3 g/dL (ref 12.0–16.0)
LYMPHS ABS: 0.7 10*3/uL — AB (ref 1.0–3.6)
Lymphocytes Relative: 15 %
MCH: 32.2 pg (ref 26.0–34.0)
MCHC: 34 g/dL (ref 32.0–36.0)
MCV: 94.8 fL (ref 80.0–100.0)
MONO ABS: 0.5 10*3/uL (ref 0.2–0.9)
MONOS PCT: 11 %
NEUTROS ABS: 3.3 10*3/uL (ref 1.4–6.5)
NEUTROS PCT: 69 %
PLATELETS: 240 10*3/uL (ref 150–440)
RBC: 4.13 MIL/uL (ref 3.80–5.20)
RDW: 13.6 % (ref 11.5–14.5)
WBC: 4.8 10*3/uL (ref 3.6–11.0)

## 2015-03-02 MED ORDER — FENTANYL 50 MCG/HR TD PT72: 50.0000 ug | MEDICATED_PATCH | TRANSDERMAL | Status: DC

## 2015-03-02 NOTE — Progress Notes (Signed)
[place] @ [facility] [phone]  [phone]  Progress note  Traci [name] OB: 01/05/1930  MR#: [id number]  XBM#:[id number]  Patient Care Team: Idelle Crouch, MD as PCP - General (Unknown Physician Specialty) [name], MD as Referring Physician (Surgery) [name], MD as Consulting Physician (Urology)  CHIEF COMPLAINT:  Chief Complaint  Patient presents with  . Breast Cancer  1/  VISIT DIAGNOSIS:   No diagnosis found.  Oncology History   1.  Carcinoma of left breast  IMPRESSION: Ultrasound-guided biopsy of a suspicious left breast mass at 9 o'clock. No apparent complications.   2.  Estrogen receptor positive.  Progesterone receptor positive.  HER-2 receptor 2+ by  [facility] . Fish is pending  5 mm tumor clinically stage IB N0 M0 tumor   3.  Patient has a previous history of endometrium  carcinoma of endometrium stage IB disease status post bilateral salpingo-oophorectomy and radiation therapy 4.  Right hydronephrosis detected on MRI scan off lumbosacral spine.  Cystoscopy with attempted stent placement revealed bladder tumor in September of 2016 biopsies positive for recurrent endometrial cancer  5.  Patient had  stent placement in the right kidney Started radiation therapy 6.  Patient has finished radiation therapy with relief in the pain (November, 2016) 7.  Started on Megace in December of 2016 8 deep vein thrombosis in the left lower extremity (February 08, 2014) Hematuria due to   xeralto IVC filter placement on February 10, 2014 She  is off xeralto  Oncology Flowsheet 04/08/2011 10/28/2014 02/10/2015  dexamethasone (DECADRON) IJ - - -  enoxaparin (LOVENOX) Wheeler 40 mg - 1 mg/kg  ondansetron (ZOFRAN) IV - - -    INTERVAL HISTORY:  82 year old lady came today further follow-up patient has 5 mm infiltrating ductal carcinoma of breast during the workup patient was found to have recurrent endometrial cancer with extensive retroperitoneal  adenopathy and pelvic wall invasion and bladder invasion started on palliative radiation therapy  Patient has significant improvement in pain.  Patient has been scheduled for cystoscopy and change in a stent.  There was some question about discontinuing Plavix.  Patient has been started on Plavix with a previous history of TIA which happened many years ago. Here for further follow-up and treatment consideration        REVIEW OF SYSTEMS:   GENERAL:  Feels good.  Active.  No fevers, sweats or weight loss. PERFORMANCE STATUS (ECOG):  01 HEENT:  No visual changes, runny nose, sore throat, mouth sores or tenderness. Lungs: No shortness of breath or cough.  No hemoptysis. Cardiac:  No chest pain, palpitations, orthopnea, or PND. Right flank pain.  Right low back pain.  Right pain radiating down her right lower extremity.  Recent cystoscopy is consistent with bladder tumor.  Biopsies positive for endometrial cancer  Musculoskeletal:  No back pain.  No joint pain.  No muscle tenderness. Extremities:  No pain or swelling. Skin:  No rashes or skin changes. Neuro:  No headache, numbness or weakness, balance or coordination issues. Endocrine:  No diabetes, thyroid issues, hot flashes or night sweats. Psych:  No mood changes, depression or anxiety. Pain: Significant relief in the pain Will try to taper off Patient from pain medication, monitoring pain  Review of systems:  All other systems reviewed and found to be negative.  As per HPI. Otherwise, a complete review of systems is negatve.  PAST MEDICAL HISTORY: Past Medical History  Diagnosis Date  . History of esophageal stricture   .  GERD (gastroesophageal reflux disease)   . Inguinal lymphocyst   . History of brachytherapy   . History of shingles   . History of colon polyps   . Mild aphasia   . Hearing loss   . History of DVT (deep vein thrombosis)   . Stroke ([place]) 04/06/2011    residual:  aphasia  . Hiccups   . Pain     SEVERE  CHRONIC RIGHT LEG,HIP,FOOT  . Chronic kidney disease     HYDRONEPHROSIS  . Endometrial adenocarcinoma ([place]) 01/2012     stage Ib, grade 1, positive peritoneal cytology, no other risk factors or extra-uterine disease  . Cancer of left breast ([place]) 10/20/2014  . Bone cancer ([place])   . Arthritis     Degenerative Disc Disease  . Gastric ulcer   . Deep vein blood clot of left lower extremity ([place]) February 11, 2015    Left Leg    PAST SURGICAL HISTORY: Past Surgical History  Procedure Laterality Date  . Esophageal dilation    . Thyroidectomy, partial    . Cataract extraction w/ intraocular lens implant      right  . Back surgery      fusion of L3&4  . Tonsillectomy    . Total abdominal hysterectomy w/ bilateral salpingoophorectomy Bilateral     staging biopsies  . Breast cyst aspiration N/A   . Breast biopsy Left 10/13/2014    path pending  . Cystoscopy w/ retrogrades Right 10/28/2014    Procedure: CYSTOSCOPY WITH RETROGRADE PYELOGRAM ATTEMPT, UNABLE TO PASS ;  Surgeon: [name], MD;  Location: [facility];  Service: Urology;  Laterality: Right;  . Cystoscopy with stent placement Right 10/28/2014    Procedure: BLADDER BIOPSY WITH FULGERATION ;  Surgeon: [name], MD;  Location: [facility];  Service: Urology;  Laterality: Right;  . Thyroid removed    . Eye surgery Bilateral     Cataract Extraction  . Portacath placement Right 11/26/2014    Procedure: INSERTION PORT-A-CATH;  Surgeon: [name], MD;  Location: [facility];  Service: General;  Laterality: Right;  . Peripheral vascular catheterization N/A 02/11/2015    Procedure: IVC Filter Insertion;  Surgeon: Algernon Huxley, MD;  Location: [place] [facility];  Service: Cardiovascular;  Laterality: N/A;  . Abdominal hysterectomy  December 2013    history of cervical cancer    FAMILY HISTORY Family History  Problem Relation Age of Onset  . Stroke Mother   . Breast cancer Maternal Aunt     x 2        ADVANCED  DIRECTIVES:  Patient does not have any living will or healthcare power of attorney.  Information was given .  Available resources had been discussed.  We will follow-up on subsequent appointments regarding this issue  HEALTH MAINTENANCE: Social History  Substance Use Topics  . Smoking status: Never Smoker   . Smokeless tobacco: Never Used  . Alcohol Use: No      Allergies  Allergen Reactions  . Penicillins Rash  . Hydrocodone-Acetaminophen Nausea Only  . Novocain [Procaine] Other (See Comments) and Nausea And Vomiting    Chest pain  . Sulfa Antibiotics Other (See Comments)    CHEST PAIN    Current Outpatient Prescriptions  Medication Sig Dispense Refill  . docusate sodium (COLACE) 100 MG capsule Take 100 mg by mouth 2 (two) times daily as needed.     . fentaNYL (DURAGESIC - DOSED MCG/HR) 50 MCG/HR Place 1 patch (50 mcg  total) onto the skin every 3 (three) days. 10 patch 0  . gabapentin (NEURONTIN) 300 MG capsule Take 1 capsule (300 mg total) by mouth 3 (three) times daily. 90 capsule 2  . gentamicin ointment (GARAMYCIN) 0.1 %     . megestrol (MEGACE) 40 MG tablet Take 1 tablet (40 mg total) by mouth 4 (four) times daily. 120 tablet 3  . oxyCODONE-acetaminophen (PERCOCET/ROXICET) 5-325 MG tablet Take 1-2 tablets by mouth every 4 (four) hours as needed for severe pain. 90 tablet 0  . warfarin (COUMADIN) 7.5 MG tablet Take 7.5 mg by mouth daily.    . clopidogrel (PLAVIX) 75 MG tablet     . Multiple Vitamins-Minerals (MULTIVITAMIN WITH MINERALS) tablet Take 1 tablet by mouth daily.     No current facility-administered medications for this visit.    OBJECTIVE: PHYSICAL EXAM: GENERAL:  Well developed, well nourished, sitting comfortably in the exam room in no acute distress. MENTAL STATUS:  Alert and oriented to person, place and time.  ENT:  Oropharynx clear without lesion.  Tongue normal. Mucous membranes moist.  RESPIRATORY:  Clear to auscultation without rales, wheezes or  rhonchi. CARDIOVASCULAR:  Regular rate and rhythm without murmur, rub or gallop.  ABDOMEN:  Soft, non-tender, with active bowel sounds, and no hepatosplenomegaly.  No masses. BACK:   CVA tenderness.  No tenderness on percussion of the back or rib cage. SKIN:  No rashes, ulcers or lesions. EXTREMITIES: Left lower extremity edema LYMPH NODES: No palpable cervical, supraclavicular, axillary or inguinal adenopathy  NEUROLOGICAL: Unremarkable. PSYCH:  Appropriate.  Filed Vitals:   03/02/15 1041  BP: 126/70  Pulse: 71  Temp: 97.8 F (36.6 C)  Resp: 18     Body mass index is 21.37 kg/(m^2).    ECOG FS:1 - Symptomatic but completely ambulatory  LAB RESULTS:  CBC Latest Ref Rng 02/16/2015 02/10/2015 01/04/2015  WBC 3.6 - 11.0 K/uL 6.1 5.0 4.8  Hemoglobin 12.0 - 16.0 g/dL 13.5 12.8 14.9  Hematocrit 35.0 - 47.0 % 40.3 38.5 43.8  Platelets 150 - 440 K/uL 262 260 299    STUDIES: Ct Abdomen Pelvis W Contrast  02/04/2015  CLINICAL DATA:  Left breast cancer, recent diagnosis. History of endometrial cancer in 2013, recurrent. EXAM: CT ABDOMEN AND PELVIS WITH CONTRAST TECHNIQUE: Multidetector CT imaging of the abdomen and pelvis was performed using the standard protocol following bolus administration of intravenous contrast. CONTRAST:  39m OMNIPAQUE IOHEXOL 300 MG/ML  SOLN COMPARISON:  11/09/2014 FINDINGS: Lower chest: Pectus excavatum, Haller index 3.1. Mild diffuse distal esophageal wall thickening. Hepatobiliary: Stably diminutive left hepatic lobe. Appendix unremarkable. Pancreas: Stable slight prominence of dorsal pancreatic duct. Spleen: Unremarkable Adrenals/Urinary Tract: Atrophic right kidney, double-J ureteral stent on the right with loops an appropriate positioning. Mild nodularity of the right adrenal gland. Fullness of the right collecting system without hydronephrosis. Poor excretion of the right kidney compared to the left. Small left peripelvic cysts. Stomach/Bowel: Sigmoid diverticulosis.  Vascular/Lymphatic: 2.8 by 1.1 cm soft tissue density along the left pelvic sidewall, I doubt this is ovary given its morphology, previously 2.6 by 1.1 cm, suspicious for tumor. 1.3 cm soft tissue nodule in the upper perirectal space, image 57 series 2, previously 1.5 cm. Reproductive: Uterus absent. Mild thickening of the left vaginal cuff, 0.9 by 1.4 cm, formerly 1.1 by 1.5 cm, possibly a tumor deposit. Other: 1.1 by 1.0 cm tumor deposit along the lower margin of the left paracolic gutter, image 50 series 2, formerly 2.0 by 1.3 cm. Musculoskeletal: Large  destructive right iliac bone mass with extension into the right iloipsoas muscle and right gluteus minimus and medius muscles, tumor approximately 6.5 by 5.5 cm, formerly 5.8 by 5.3 cm by my measurements of, with tumor tracking adjacent to the sacral plexus and upper sciatic nerve similar to prior. Posterolateral rod and pedicle screw fixation at L3-4 bilaterally. Degenerative loss of intervertebral disc height at L5-S1. Levoconvex thoracolumbar scoliosis. IMPRESSION: 1. Mixed response, with some of the tumor deposits smaller (such as the tumor deposit along the lower margin of the left paracolic gutter), but with enlargement of the destructive right iliac bone metastatic lesion with extensive extraosseous extension. The posterior margin of this latter lesion may be impinging upon the right sacral plexus. 2. Other suspected tumor deposits along the left pelvic sidewall, in the upper perirectal space, and along the left side of the vaginal cuff, generally similar size to prior. 3. Placement of a double-J ureteral stent on the right, with atrophic right kidney. 4. Other imaging findings of potential clinical significance: Pectus excavatum. Distal esophageal wall thickening suggesting esophagitis. Sigmoid diverticulosis. Electronically Signed   By: Van Clines M.D.   On: 02/04/2015 13:06   US Venous Img Lower Unilateral Left  02/09/2015  CLINICAL DATA:  Left  lower extremity swelling. EXAM: LEFT LOWER EXTREMITY VENOUS DOPPLER ULTRASOUND TECHNIQUE: Gray-scale sonography with graded compression, as well as color Doppler and duplex ultrasound were performed to evaluate the lower extremity deep venous systems from the level of the common femoral vein and including the common femoral, femoral, profunda femoral, popliteal and calf veins including the posterior tibial, peroneal and gastrocnemius veins when visible. The superficial great saphenous vein was also interrogated. Spectral Doppler was utilized to evaluate flow at rest and with distal augmentation maneuvers in the common femoral, femoral and popliteal veins. COMPARISON:  None. FINDINGS: Contralateral Common Femoral Vein: Respiratory phasicity is normal and symmetric with the symptomatic side. No evidence of thrombus. Normal compressibility. Common Femoral Vein: No evidence of thrombus. Normal compressibility, respiratory phasicity and response to augmentation. Saphenofemoral Junction: No evidence of thrombus. Normal compressibility and flow on color Doppler imaging. Profunda Femoral Vein: No evidence of thrombus. Normal compressibility and flow on color Doppler imaging. Femoral Vein: Occlusive thrombus in the mid and distal left femoral vein. Popliteal Vein: Nonocclusive thrombus the popliteal vein. Calf Veins: No evidence of thrombus. Normal compressibility and flow on color Doppler imaging. Peroneal veins not visualized. Superficial Great Saphenous Vein: No evidence of thrombus. Normal compressibility and flow on color Doppler imaging. Venous Reflux:  None. Other Findings:  None. IMPRESSION: Positive study for deep venous thrombosis. Electronically Signed   By: Marcello Moores  Register   On: 02/09/2015 15:12    ASSESSMENT:  Carcinoma of left breast at 9:00 position clinically stage is T1b N0 M0 tumor estrogen receptor positive progesterone receptor positive HER-2/neu receptor pending by fish Breast cancer surgery has  been put on hold because of diagnosis of endometrial cancer Plavix can be discontinued for a short duration of time while procedure with cystoscopy and stent being change.d. I will reevaluate patient after her cystoscopy and stent exchange possibility of starting lower dose of anticoagulation with  ELIQOIS can be considered. We will have to proceed with treatment for breast cancer and that has been discussed with the patient. Reevaluate patient after cystoscopy

## 2015-03-02 NOTE — Progress Notes (Signed)
Patient states she is sore today due to a fall in her garage yesterday.  Patient needs refill for Fentanyl.

## 2015-03-03 LAB — CA 125: CA 125: 29.6 U/mL (ref 0.0–38.1)

## 2015-03-05 ENCOUNTER — Ambulatory Visit: Payer: Medicare Other

## 2015-03-05 ENCOUNTER — Other Ambulatory Visit: Payer: Medicare Other

## 2015-03-08 ENCOUNTER — Telehealth: Payer: Self-pay | Admitting: *Deleted

## 2015-03-08 ENCOUNTER — Other Ambulatory Visit: Payer: Medicare Other

## 2015-03-08 DIAGNOSIS — N390 Urinary tract infection, site not specified: Secondary | ICD-10-CM

## 2015-03-08 LAB — URINALYSIS, COMPLETE
BILIRUBIN UA: NEGATIVE
Glucose, UA: NEGATIVE
Ketones, UA: NEGATIVE
Nitrite, UA: NEGATIVE
PH UA: 7 (ref 5.0–7.5)
Specific Gravity, UA: 1.015 (ref 1.005–1.030)
Urobilinogen, Ur: 0.2 mg/dL (ref 0.2–1.0)

## 2015-03-08 LAB — MICROSCOPIC EXAMINATION
EPITHELIAL CELLS (NON RENAL): NONE SEEN /HPF (ref 0–10)
RBC, UA: >30 /hpf — ABNORMAL HIGH (ref 0–?)

## 2015-03-08 NOTE — Telephone Encounter (Signed)
I called [facility] who confirmed that she has a refill on current prescription. They will get it ready for her. I called patient and notified her of this adn she thanked me for taking care of this for her.

## 2015-03-09 ENCOUNTER — Encounter: Payer: Self-pay | Admitting: Oncology

## 2015-03-10 ENCOUNTER — Ambulatory Visit: Payer: Medicare Other | Admitting: Anesthesiology

## 2015-03-10 ENCOUNTER — Ambulatory Visit
Admission: RE | Admit: 2015-03-10 | Discharge: 2015-03-10 | Disposition: A | Discharge status: Home / Self Care | Payer: Medicare Other | Source: Ambulatory Visit | Attending: Urology | Admitting: Urology

## 2015-03-10 ENCOUNTER — Encounter
Admission: RE | Disposition: A | Discharge status: Home / Self Care | Payer: Self-pay | Source: Ambulatory Visit | Attending: Urology

## 2015-03-10 DIAGNOSIS — C7981 Secondary malignant neoplasm of breast: Secondary | ICD-10-CM | POA: Insufficient documentation

## 2015-03-10 DIAGNOSIS — Z961 Presence of intraocular lens: Secondary | ICD-10-CM | POA: Insufficient documentation

## 2015-03-10 DIAGNOSIS — Z9841 Cataract extraction status, right eye: Secondary | ICD-10-CM | POA: Insufficient documentation

## 2015-03-10 DIAGNOSIS — M25551 Pain in right hip: Secondary | ICD-10-CM | POA: Insufficient documentation

## 2015-03-10 DIAGNOSIS — Z90710 Acquired absence of both cervix and uterus: Secondary | ICD-10-CM | POA: Diagnosis not present

## 2015-03-10 DIAGNOSIS — Z8673 Personal history of transient ischemic attack (TIA), and cerebral infarction without residual deficits: Secondary | ICD-10-CM | POA: Insufficient documentation

## 2015-03-10 DIAGNOSIS — Z86718 Personal history of other venous thrombosis and embolism: Secondary | ICD-10-CM | POA: Insufficient documentation

## 2015-03-10 DIAGNOSIS — R4701 Aphasia: Secondary | ICD-10-CM | POA: Diagnosis not present

## 2015-03-10 DIAGNOSIS — C7951 Secondary malignant neoplasm of bone: Secondary | ICD-10-CM | POA: Insufficient documentation

## 2015-03-10 DIAGNOSIS — Z7902 Long term (current) use of antithrombotics/antiplatelets: Secondary | ICD-10-CM | POA: Insufficient documentation

## 2015-03-10 DIAGNOSIS — K219 Gastro-esophageal reflux disease without esophagitis: Secondary | ICD-10-CM | POA: Insufficient documentation

## 2015-03-10 DIAGNOSIS — Z923 Personal history of irradiation: Secondary | ICD-10-CM | POA: Diagnosis not present

## 2015-03-10 DIAGNOSIS — M79671 Pain in right foot: Secondary | ICD-10-CM | POA: Insufficient documentation

## 2015-03-10 DIAGNOSIS — M79604 Pain in right leg: Secondary | ICD-10-CM | POA: Insufficient documentation

## 2015-03-10 DIAGNOSIS — Z981 Arthrodesis status: Secondary | ICD-10-CM | POA: Diagnosis not present

## 2015-03-10 DIAGNOSIS — E890 Postprocedural hypothyroidism: Secondary | ICD-10-CM | POA: Insufficient documentation

## 2015-03-10 DIAGNOSIS — N189 Chronic kidney disease, unspecified: Secondary | ICD-10-CM | POA: Diagnosis not present

## 2015-03-10 DIAGNOSIS — Z8541 Personal history of malignant neoplasm of cervix uteri: Secondary | ICD-10-CM | POA: Diagnosis not present

## 2015-03-10 DIAGNOSIS — N1330 Unspecified hydronephrosis: Secondary | ICD-10-CM

## 2015-03-10 DIAGNOSIS — Z79899 Other long term (current) drug therapy: Secondary | ICD-10-CM | POA: Insufficient documentation

## 2015-03-10 DIAGNOSIS — Z86010 Personal history of colonic polyps: Secondary | ICD-10-CM | POA: Insufficient documentation

## 2015-03-10 DIAGNOSIS — Z8711 Personal history of peptic ulcer disease: Secondary | ICD-10-CM | POA: Insufficient documentation

## 2015-03-10 DIAGNOSIS — G8929 Other chronic pain: Secondary | ICD-10-CM | POA: Diagnosis not present

## 2015-03-10 DIAGNOSIS — C7911 Secondary malignant neoplasm of bladder: Secondary | ICD-10-CM | POA: Insufficient documentation

## 2015-03-10 DIAGNOSIS — Z79891 Long term (current) use of opiate analgesic: Secondary | ICD-10-CM | POA: Diagnosis not present

## 2015-03-10 HISTORY — PX: CYSTOSCOPY W/ URETERAL STENT PLACEMENT: SHX1429

## 2015-03-10 LAB — CULTURE, URINE COMPREHENSIVE

## 2015-03-10 SURGERY — CYSTOSCOPY, FLEXIBLE, WITH STENT REPLACEMENT
Anesthesia: General | Laterality: Right | Wound class: Clean Contaminated

## 2015-03-10 MED ORDER — PROPOFOL 10 MG/ML IV BOLUS
INTRAVENOUS | Status: DC | PRN
  Administered 2015-03-10: 130 mg via INTRAVENOUS

## 2015-03-10 MED ORDER — ONDANSETRON HCL 4 MG/2ML IJ SOLN
INTRAMUSCULAR | Status: DC | PRN
Start: 2015-03-10 — End: 2015-03-10
  Administered 2015-03-10: 4 mg via INTRAVENOUS

## 2015-03-10 MED ORDER — FAMOTIDINE 20 MG PO TABS
ORAL_TABLET | ORAL | Status: AC
  Administered 2015-03-10: 20 mg via ORAL
  Filled 2015-03-10: qty 1

## 2015-03-10 MED ORDER — CIPROFLOXACIN HCL 500 MG PO TABS: 500.0000 mg | ORAL_TABLET | Freq: Two times a day (BID) | ORAL | Status: DC

## 2015-03-10 MED ORDER — FENTANYL CITRATE (PF) 100 MCG/2ML IJ SOLN: 25.0000 ug | INTRAMUSCULAR | Status: DC | PRN

## 2015-03-10 MED ORDER — CIPROFLOXACIN IN D5W 400 MG/200ML IV SOLN
INTRAVENOUS | Status: AC
  Administered 2015-03-10: 400 mg via INTRAVENOUS
  Filled 2015-03-10: qty 200

## 2015-03-10 MED ORDER — GLYCOPYRROLATE 0.2 MG/ML IJ SOLN
INTRAMUSCULAR | Status: DC | PRN
Start: 2015-03-10 — End: 2015-03-10
  Administered 2015-03-10: 0.2 mg via INTRAVENOUS

## 2015-03-10 MED ORDER — LIDOCAINE HCL (CARDIAC) 20 MG/ML IV SOLN
INTRAVENOUS | Status: DC | PRN
  Administered 2015-03-10: 60 mg via INTRAVENOUS

## 2015-03-10 MED ORDER — CIPROFLOXACIN IN D5W 400 MG/200ML IV SOLN
400.0000 mg | Freq: Once | INTRAVENOUS | Status: AC
  Administered 2015-03-10: 400 mg via INTRAVENOUS

## 2015-03-10 MED ORDER — LACTATED RINGERS IV SOLN
INTRAVENOUS | Status: DC
  Administered 2015-03-10: 14:00:00 via INTRAVENOUS

## 2015-03-10 MED ORDER — FAMOTIDINE 20 MG PO TABS
20.0000 mg | ORAL_TABLET | Freq: Once | ORAL | Status: AC
  Administered 2015-03-10: 20 mg via ORAL

## 2015-03-10 MED ORDER — ONDANSETRON HCL 4 MG/2ML IJ SOLN: 4.0000 mg | Freq: Once | INTRAMUSCULAR | Status: DC | PRN

## 2015-03-10 MED ORDER — FENTANYL CITRATE (PF) 100 MCG/2ML IJ SOLN
INTRAMUSCULAR | Status: DC | PRN
  Administered 2015-03-10: 25 ug via INTRAVENOUS

## 2015-03-10 SURGICAL SUPPLY — 22 items
BACTOSHIELD CHG 4% 4OZ (MISCELLANEOUS) ×2
CATH URETL 5X70 OPEN END (CATHETERS) ×3 IMPLANT
CONRAY 43 FOR UROLOGY 50M (MISCELLANEOUS) ×3 IMPLANT
GLOVE BIO SURGEON STRL SZ7 (GLOVE) ×6 IMPLANT
GLOVE BIO SURGEON STRL SZ7.5 (GLOVE) ×3 IMPLANT
GOWN STRL REUS W/ TWL LRG LVL3 (GOWN DISPOSABLE) ×1 IMPLANT
GOWN STRL REUS W/ TWL LRG LVL4 (GOWN DISPOSABLE) ×1 IMPLANT
GOWN STRL REUS W/TWL LRG LVL3 (GOWN DISPOSABLE) ×2
GOWN STRL REUS W/TWL LRG LVL4 (GOWN DISPOSABLE) ×2
GOWN STRL REUS W/TWL XL LVL3 (GOWN DISPOSABLE) ×3 IMPLANT
KIT RM TURNOVER CYSTO AR (KITS) ×3 IMPLANT
PACK CYSTO AR (MISCELLANEOUS) ×3 IMPLANT
SCRUB CHG 4% DYNA-HEX 4OZ (MISCELLANEOUS) ×1 IMPLANT
SENSORWIRE 0.038 NOT ANGLED (WIRE) ×3
SET CYSTO W/LG BORE CLAMP LF (SET/KITS/TRAYS/PACK) ×3 IMPLANT
SOL .9 NS 3000ML IRR  AL (IV SOLUTION) ×2
SOL .9 NS 3000ML IRR UROMATIC (IV SOLUTION) ×1 IMPLANT
STENT URET 6FRX24 CONTOUR (STENTS) ×3 IMPLANT
STENT URET 6FRX26 CONTOUR (STENTS) IMPLANT
SURGILUBE 2OZ TUBE FLIPTOP (MISCELLANEOUS) ×3 IMPLANT
WATER STERILE IRR 1000ML POUR (IV SOLUTION) ×3 IMPLANT
WIRE SENSOR 0.038 NOT ANGLED (WIRE) ×1 IMPLANT

## 2015-03-10 NOTE — Transfer of Care (Signed)
Immediate Anesthesia Transfer of Care Note  Patient: Traci [name]  Procedure(s) Performed: Procedure(s): CYSTOSCOPY WITH STENT REPLACEMENT (Right)  Patient Location: PACU  Anesthesia Type:General  Level of Consciousness: sedated  Airway & Oxygen Therapy: Patient Spontanous Breathing and Patient connected to face mask oxygen  Post-op Assessment: Report given to RN and Post -op Vital signs reviewed and stable  Post vital signs: Reviewed and stable  Last Vitals:  Filed Vitals:   03/10/15 1355 03/10/15 1621  BP: 131/62 103/52  Pulse: 93 76  Temp: 37.2 C 36.3 C  Resp: 16 10    Complications: No apparent anesthesia complications

## 2015-03-10 NOTE — Op Note (Signed)
Date of procedure: 03/10/2015  Preoperative diagnosis:  1. Malignant right hydroureteronephrosis   Postoperative diagnosis:  1. Malignant right hydroureteronephrosis   Procedure: 1. Cystoscopy 2. Right ureteral stent exchange 6 [place] by 24 cm  Surgeon: Baruch Gouty, MD  Anesthesia: General  Complications: None  Intraoperative findings: Stent was in the correct position at the end of the procedure  EBL: None  Specimens: None  Drains: 6 French by 24 cm right double-J ureteral stent  Disposition: Stable to the postanesthesia care unit  Indication for procedure: The patient is a 82 y.o. female with malignant right hydroureteronephrosis who presents for stent change. Right kidney is minimally functional however she was having right flank pain from his obstruction.  After reviewing the management options for treatment, the patient elected to proceed with the above surgical procedure(s). We have discussed the potential benefits and risks of the procedure, side effects of the proposed treatment, the likelihood of the patient achieving the goals of the procedure, and any potential problems that might occur during the procedure or recuperation. Informed consent has been obtained.  Description of procedure: The patient was met in the preoperative area. All risks, benefits, and indications of the procedure were described in great detail. The patient consented to the procedure. Preoperative antibiotics were given. The patient was taken to the operative theater. General anesthesia was induced per the anesthesia service. The patient was then placed in the dorsal lithotomy position and prepped and draped in the usual sterile fashion. A preoperative timeout was called. A 21 French 30 cystoscope was inserted into the patient's bladder per urethra atraumatic. Fluoroscopy showed the stent to be in the correct position. The stent was grasped with flexible graspers and brought to level of the patient's  urethral meatus. A sensor wire was placed through the stent up to the patient's right renal pelvis and the fluoroscopy. The stent was removed. The cystoscope was assembled over the sensor wire. A 6 French by 24 cm double-J ureteral stent was then placed over the sensor wire. The sensor wire was removed. The stent was confirmed to be in the correct position with a curl seen in the patient's right renal pelvis as it was on the preoperative film approximate. A curl was seen in the patient's urinary bladder distally. The patient's bladder was drained. She awoke from anesthesia and transferred stable condition to the postanesthesia care unit.  Plan: She'll follow-up in 3 months for repeat stent exchange. If she continues to have reduction in the size of her abdominal malignancy, we can consider retrograde pyelogram at that time and possible stent removal.  Baruch Gouty, M.D.

## 2015-03-10 NOTE — H&P (Addendum)
Traci [name] 09-16-1929 MD:[id number]  Referring provider: Idelle Crouch, MD [facility] [place], [place] [address]  Chief Complaint  Patient presents with  . Follow-up    Endometrial carcinoma invasion of the bladder    HPI: The patient is a 82 year old female with past history significant for cervical cancer found to have severe right hydroureteronephrosis found incidentally on a spinal MRI. The patient also reports pain that radiates from her back down the backside of her leg. Genitourinary symptoms that she is being treated for which he leads a UTI with Cipro at this time. She also has breast cancer, and her surgeon would like for her hydronephrosis corrected prior to this. Of note she is on aspirin and Plavix. She she underwent a total abdominal hysterectomy for cervical cancer 3 years ago. Of note she did receive radiation that time.  Interval History: The patient underwent an attempted right ureteral stent placement that was unsuccessful. At that time a small mass was seen in the bladder which was biopsied. This came back as endometrial carcinoma.  December 2016 Interval History: She was found to have a large mass invading her right iliac bone on CT scan. Intervention radiology was able to place an internalized antegrade ureteral stent. It is due to be changed in late January 2017.  In terms of her cancer therapy, she is undergone radiation. She faces 4 weeks ago. She has an appointment with Dr. Oliva Bustard next week to discuss the next step.   PMH: Past Medical History  Diagnosis Date  . History of esophageal stricture   . GERD (gastroesophageal reflux disease)   . Inguinal lymphocyst   . History of brachytherapy   . History of shingles   . History of colon polyps   . Mild aphasia   . Hearing loss   . History of DVT (deep vein thrombosis)   . Stroke ([place]) 04/06/2011    residual: aphasia  . Hiccups    . Pain     SEVERE CHRONIC RIGHT LEG,HIP,FOOT  . Chronic kidney disease     HYDRONEPHROSIS  . Endometrial adenocarcinoma ([place]) 01/2012    stage Ib, grade 1, positive peritoneal cytology, no other risk factors or extra-uterine disease  . Cancer of left breast ([place]) 10/20/2014  . Bone cancer ([place])   . Arthritis     Degenerative Disc Disease  . Gastric ulcer     Surgical History: Past Surgical History  Procedure Laterality Date  . Esophageal dilation    . Thyroidectomy, partial    . Cataract extraction w/ intraocular lens implant      right  . Back surgery      fusion of L3&4  . Tonsillectomy    . Total abdominal hysterectomy w/ bilateral salpingoophorectomy Bilateral     staging biopsies  . Breast cyst aspiration N/A   . Breast biopsy Left 10/13/2014    path pending  . Cystoscopy w/ retrogrades Right 10/28/2014    Procedure: CYSTOSCOPY WITH RETROGRADE PYELOGRAM ATTEMPT, UNABLE TO PASS ; Surgeon: [name], MD; Location: [facility] ORS; Service: Urology; Laterality: Right;  . Cystoscopy with stent placement Right 10/28/2014    Procedure: BLADDER BIOPSY WITH FULGERATION ; Surgeon: [name], MD; Location: [facility] ORS; Service: Urology; Laterality: Right;  . Thyroid removed    . Eye surgery Bilateral     Cataract Extraction  . Abdominal hysterectomy    . Portacath placement Right 11/26/2014    Procedure: INSERTION PORT-A-CATH; Surgeon: [name], MD;  Location: [facility] ORS; Service: General; Laterality: Right;    Home Medications:    Medication List       This list is accurate as of: 02/05/15 10:17 AM. Always use your most recent med list.              clopidogrel 75 MG tablet  Commonly known as: PLAVIX  Take 1 tablet by mouth daily.     docusate sodium 100 MG capsule  Commonly known as: COLACE  Take  100 mg by mouth 2 (two) times daily.     fentaNYL 50 MCG/HR  Commonly known as: DURAGESIC - dosed mcg/hr  Place 1 patch (50 mcg total) onto the skin every 3 (three) days.     gabapentin 300 MG capsule  Commonly known as: NEURONTIN  Take 1 capsule (300 mg total) by mouth 3 (three) times daily.     gentamicin ointment 0.1 %  Commonly known as: GARAMYCIN     megestrol 40 MG tablet  Commonly known as: MEGACE  Take 1 tablet (40 mg total) by mouth 4 (four) times daily.     meloxicam 15 MG tablet  Commonly known as: MOBIC  Take 1 tablet (15 mg total) by mouth daily.     multivitamin with minerals tablet  Take 1 tablet by mouth daily.     oxyCODONE-acetaminophen 5-325 MG tablet  Commonly known as: PERCOCET/ROXICET  Take 1-2 tablets by mouth every 4 (four) hours as needed for severe pain.        Allergies:  Allergies  Allergen Reactions  . Penicillins Rash  . Hydrocodone-Acetaminophen Nausea Only  . Novocain [Procaine] Other (See Comments) and Nausea And Vomiting    Chest pain  . Sulfa Antibiotics Other (See Comments)    CHEST PAIN    Family History: Family History  Problem Relation Age of Onset  . Stroke Mother   . Breast cancer Maternal Aunt     x 2    Social History:  reports that she has never smoked. She has never used smokeless tobacco. She reports that she does not drink alcohol or use illicit drugs.  ROS: UROLOGY Frequent Urination?: No Hard to postpone urination?: No Burning/pain with urination?: No Get up at night to urinate?: Yes Leakage of urine?: Yes Urine stream starts and stops?: No Trouble starting stream?: No Do you have to strain to urinate?: No Blood in urine?: No Urinary tract infection?: No Sexually transmitted disease?: No Injury to kidneys or bladder?: No Painful intercourse?: No Weak stream?: No Currently pregnant?: No Vaginal bleeding?: No Last menstrual  period?: No  Gastrointestinal Nausea?: No Vomiting?: No Indigestion/heartburn?: No Diarrhea?: No Constipation?: No  Constitutional Fever: No Night sweats?: No Weight loss?: No Fatigue?: No  Skin Skin rash/lesions?: No Itching?: No  Eyes Blurred vision?: No Double vision?: No  Ears/Nose/Throat Sore throat?: No Sinus problems?: No  Hematologic/Lymphatic Swollen glands?: No Easy bruising?: No  Cardiovascular Leg swelling?: No Chest pain?: No  Respiratory Cough?: No Shortness of breath?: No  Endocrine Excessive thirst?: No  Musculoskeletal Back pain?: No Joint pain?: No  Neurological Dizziness?: No  Psychologic Depression?: No Anxiety?: No  Physical Exam: BP 105/70 mmHg  Pulse 67  Ht 5\' 4"  (1.626 m)  Wt 122 lb 8 oz (55.566 kg)  BMI 21.02 kg/m2  Constitutional: Alert and oriented, No acute distress. HEENT: [place] AT, moist mucus membranes. Trachea midline, no masses. Cardiovascular: No clubbing, cyanosis, or edema. Respiratory: Normal respiratory effort, no increased work of breathing. GI: Abdomen is soft,  nontender, nondistended, no abdominal masses GU: No CVA tenderness.  Skin: No rashes, bruises or suspicious lesions. Lymph: No cervical or inguinal adenopathy. Neurologic: Grossly intact, no focal deficits, moving all 4 extremities. Psychiatric: Normal mood and affect.  Laboratory Data:  Recent Labs    Lab Results  Component Value Date   WBC 4.8 01/04/2015   HGB 14.9 01/04/2015   HCT 43.8 01/04/2015   MCV 92.7 01/04/2015   PLT 299 01/04/2015       Recent Labs    Lab Results  Component Value Date   CREATININE 1.11* 01/04/2015       Recent Labs    No results found for: PSA     Recent Labs    No results found for: TESTOSTERONE     Recent Labs    Lab Results  Component Value Date   HGBA1C 5.5 04/08/2011      Urinalysis  Labs (Brief)       Component Value Date/Time    COLORURINE YELLOW* 10/20/2014 0936   COLORURINE Yellow 03/29/2012 1930   APPEARANCEUR CLEAR* 10/20/2014 0936   APPEARANCEUR Hazy 03/29/2012 1930   LABSPEC 1.013 10/20/2014 0936   LABSPEC 1.023 03/29/2012 1930   PHURINE 6.0 10/20/2014 0936   PHURINE 7.0 03/29/2012 1930   GLUCOSEU NEGATIVE 10/20/2014 0936   GLUCOSEU 50 mg/dL 03/29/2012 1930   HGBUR NEGATIVE 10/20/2014 0936   HGBUR Negative 03/29/2012 1930   BILIRUBINUR NEGATIVE 10/20/2014 0936   BILIRUBINUR Negative 03/29/2012 1930   KETONESUR NEGATIVE 10/20/2014 0936   KETONESUR 1+ 03/29/2012 1930   PROTEINUR NEGATIVE 10/20/2014 0936   PROTEINUR Negative 03/29/2012 1930   NITRITE NEGATIVE 10/20/2014 0936   NITRITE Negative 03/29/2012 1930   LEUKOCYTESUR NEGATIVE 10/20/2014 0936   LEUKOCYTESUR Negative 03/29/2012 1930      Pertinent Imaging: CLINICAL DATA: Left breast cancer, recent diagnosis. History of endometrial cancer in 2013, recurrent.  EXAM: CT ABDOMEN AND PELVIS WITH CONTRAST  TECHNIQUE: Multidetector CT imaging of the abdomen and pelvis was performed using the standard protocol following bolus administration of intravenous contrast.  CONTRAST: 93mL OMNIPAQUE IOHEXOL 300 MG/ML SOLN  COMPARISON: 11/09/2014  FINDINGS: Lower chest: Pectus excavatum, Haller index 3.1. Mild diffuse distal esophageal wall thickening.  Hepatobiliary: Stably diminutive left hepatic lobe. Appendix unremarkable.  Pancreas: Stable slight prominence of dorsal pancreatic duct.  Spleen: Unremarkable  Adrenals/Urinary Tract: Atrophic right kidney, double-J ureteral stent on the right with loops an appropriate positioning. Mild nodularity of the right adrenal gland. Fullness of the right collecting system without hydronephrosis. Poor excretion of the right kidney compared to the left. Small left peripelvic cysts.  Stomach/Bowel:  Sigmoid diverticulosis.  Vascular/Lymphatic: 2.8 by 1.1 cm soft tissue density along the left pelvic sidewall, I doubt this is ovary given its morphology, previously 2.6 by 1.1 cm, suspicious for tumor. 1.3 cm soft tissue nodule in the upper perirectal space, image 57 series 2, previously 1.5 cm.  Reproductive: Uterus absent. Mild thickening of the left vaginal cuff, 0.9 by 1.4 cm, formerly 1.1 by 1.5 cm, possibly a tumor deposit.  Other: 1.1 by 1.0 cm tumor deposit along the lower margin of the left paracolic gutter, image 50 series 2, formerly 2.0 by 1.3 cm.  Musculoskeletal: Large destructive right iliac bone mass with extension into the right iloipsoas muscle and right gluteus minimus and medius muscles, tumor approximately 6.5 by 5.5 cm, formerly 5.8 by 5.3 cm by my measurements of, with tumor tracking adjacent to the sacral plexus and upper sciatic nerve similar to prior.  Posterolateral rod and pedicle screw fixation at L3-4 bilaterally. Degenerative loss of intervertebral disc height at L5-S1. Levoconvex thoracolumbar scoliosis.  IMPRESSION: 1. Mixed response, with some of the tumor deposits smaller (such as the tumor deposit along the lower margin of the left paracolic gutter) The posterior margin of this latter lesion may be impinging up, but with enlargement of the destructive right iliac bone metastatic lesion with extensive extraosseous extension.on the right sacral plexus. 2. Other suspected tumor deposits along the left pelvic sidewall, in the upper perirectal space, and along the left side of the vaginal cuff, generally similar size to prior. 3. Placement of a double-J ureteral stent on the right, with atrophic right kidney. 4. Other imaging findings of potential clinical significance: Pectus excavatum. Distal esophageal wall thickening suggesting esophagitis. Sigmoid diverticulosis.   Assessment & Plan:   1. Malignant right  hydroureteronephrosis Change right ureteral stent today  2. Metastatic endometrial cancer Management per Dr. Oliva Bustard   The patient will call us next week when she knows what her next step is in regards to her therapy.  [name], MD  [facility] [place] [place], [place], [place] [address] [phone]      Addendum: RRR  Unlabored respirations

## 2015-03-10 NOTE — Discharge Instructions (Signed)

## 2015-03-10 NOTE — Anesthesia Preprocedure Evaluation (Signed)
Anesthesia Evaluation  Patient identified by MRN, date of birth, ID band Patient awake    Reviewed: Allergy & Precautions, H&P , NPO status , Patient's Chart, lab work & pertinent test results, reviewed documented beta blocker date and time   History of Anesthesia Complications Negative for: history of anesthetic complications  Airway Mallampati: III  TM Distance: >3 FB Neck ROM: full    Dental no notable dental hx. (+) Partial Lower, Partial Upper, Poor Dentition   Pulmonary neg pulmonary ROS,    Pulmonary exam normal breath sounds clear to auscultation       Cardiovascular Exercise Tolerance: Good negative cardio ROS Normal cardiovascular exam Rhythm:regular Rate:Normal     Neuro/Psych neg Seizures  Neuromuscular disease CVA negative psych ROS   GI/Hepatic Neg liver ROS, PUD, GERD  ,  Endo/Other  negative endocrine ROS  Renal/GU Renal disease  negative genitourinary   Musculoskeletal   Abdominal   Peds  Hematology negative hematology ROS (+)   Anesthesia Other Findings Past Medical History:   History of esophageal stricture                              GERD (gastroesophageal reflux disease)                       Inguinal lymphocyst                                          History of brachytherapy                                     History of shingles                                          History of colon polyps                                      Mild aphasia                                                 Hearing loss                                                 History of DVT (deep vein thrombosis)                        Stroke ([place])                                    04/06/2011      Comment:residual:  aphasia   Hiccups  Pain                                                           Comment:SEVERE CHRONIC RIGHT LEG,HIP,FOOT   Chronic kidney  disease                                         Comment:HYDRONEPHROSIS   Endometrial adenocarcinoma ([place])                01/2012        Comment: stage Ib, grade 1, positive peritoneal               cytology, no other risk factors or               extra-uterine disease   Cancer of left breast ([place])                     10/20/2014    Bone cancer ([place])                                            Arthritis                                                      Comment:Degenerative Disc Disease   Gastric ulcer                                                Deep vein blood clot of left lower extremity (* January 5*     Comment:Left Leg   Reproductive/Obstetrics negative OB ROS                             Anesthesia Physical Anesthesia Plan  ASA: III  Anesthesia Plan: General   Post-op Pain Management:    Induction:   Airway Management Planned:   Additional Equipment:   Intra-op Plan:   Post-operative Plan:   Informed Consent: I have reviewed the patients History and Physical, chart, labs and discussed the procedure including the risks, benefits and alternatives for the proposed anesthesia with the patient or authorized representative who has indicated his/her understanding and acceptance.   Dental Advisory Given  Plan Discussed with: Anesthesiologist, CRNA and Surgeon  Anesthesia Plan Comments:         Anesthesia Quick Evaluation

## 2015-03-10 NOTE — OR Nursing (Signed)
Port-a-cath right side

## 2015-03-10 NOTE — Anesthesia Procedure Notes (Signed)
Procedure Name: LMA Insertion Date/Time: 03/10/2015 4:04 PM Performed by: Doreen Salvage Pre-anesthesia Checklist: Patient identified, Patient being monitored, Timeout performed, Emergency Drugs available and Suction available Patient Re-evaluated:Patient Re-evaluated prior to inductionOxygen Delivery Method: Circle system utilized Preoxygenation: Pre-oxygenation with 100% oxygen Intubation Type: IV induction Ventilation: Mask ventilation without difficulty LMA: LMA inserted LMA Size: 3.5 Tube type: Oral Number of attempts: 1 Placement Confirmation: positive ETCO2 and breath sounds checked- equal and bilateral Tube secured with: Tape Dental Injury: Teeth and Oropharynx as per pre-operative assessment

## 2015-03-11 ENCOUNTER — Encounter: Payer: Self-pay | Admitting: Urology

## 2015-03-11 NOTE — Anesthesia Postprocedure Evaluation (Signed)
Anesthesia Post Note  Patient: ADRIENNA SOELBERG  Procedure(s) Performed: Procedure(s) (LRB): CYSTOSCOPY WITH STENT REPLACEMENT (Right)  Patient location during evaluation: PACU Anesthesia Type: General Level of consciousness: awake and alert Pain management: pain level controlled Vital Signs Assessment: post-procedure vital signs reviewed and stable Respiratory status: spontaneous breathing, nonlabored ventilation, respiratory function stable and patient connected to nasal cannula oxygen Cardiovascular status: blood pressure returned to baseline and stable Postop Assessment: no signs of nausea or vomiting Anesthetic complications: no    Last Vitals:  Filed Vitals:   03/10/15 1717 03/10/15 1754  BP: 151/65 148/70  Pulse: 64 68  Temp:    Resp: 16 16    Last Pain:  Filed Vitals:   03/11/15 1051  PainSc: 0-No pain                 Martha Clan

## 2015-03-15 ENCOUNTER — Inpatient Hospital Stay: Payer: Medicare Other | Attending: Oncology | Admitting: Oncology

## 2015-03-15 ENCOUNTER — Inpatient Hospital Stay: Payer: Medicare Other

## 2015-03-15 ENCOUNTER — Other Ambulatory Visit: Payer: Self-pay | Admitting: *Deleted

## 2015-03-15 ENCOUNTER — Inpatient Hospital Stay: Payer: Medicare Other | Admitting: Oncology

## 2015-03-15 ENCOUNTER — Encounter: Payer: Self-pay | Admitting: Oncology

## 2015-03-15 ENCOUNTER — Telehealth: Payer: Self-pay | Admitting: Radiology

## 2015-03-15 VITALS — HR 69 | Temp 97.4°F | Resp 18 | Wt 124.1 lb

## 2015-03-15 DIAGNOSIS — Z803 Family history of malignant neoplasm of breast: Secondary | ICD-10-CM | POA: Insufficient documentation

## 2015-03-15 DIAGNOSIS — M545 Low back pain: Secondary | ICD-10-CM | POA: Diagnosis not present

## 2015-03-15 DIAGNOSIS — Z923 Personal history of irradiation: Secondary | ICD-10-CM | POA: Diagnosis not present

## 2015-03-15 DIAGNOSIS — C50912 Malignant neoplasm of unspecified site of left female breast: Secondary | ICD-10-CM

## 2015-03-15 DIAGNOSIS — C7911 Secondary malignant neoplasm of bladder: Secondary | ICD-10-CM | POA: Diagnosis not present

## 2015-03-15 DIAGNOSIS — K219 Gastro-esophageal reflux disease without esophagitis: Secondary | ICD-10-CM | POA: Diagnosis not present

## 2015-03-15 DIAGNOSIS — Z86718 Personal history of other venous thrombosis and embolism: Secondary | ICD-10-CM | POA: Diagnosis not present

## 2015-03-15 DIAGNOSIS — C541 Malignant neoplasm of endometrium: Secondary | ICD-10-CM

## 2015-03-15 DIAGNOSIS — Z170 Estrogen receptor positive status [ER+]: Secondary | ICD-10-CM | POA: Diagnosis not present

## 2015-03-15 DIAGNOSIS — Z87440 Personal history of urinary (tract) infections: Secondary | ICD-10-CM | POA: Insufficient documentation

## 2015-03-15 DIAGNOSIS — Z8673 Personal history of transient ischemic attack (TIA), and cerebral infarction without residual deficits: Secondary | ICD-10-CM | POA: Diagnosis not present

## 2015-03-15 DIAGNOSIS — Z79899 Other long term (current) drug therapy: Secondary | ICD-10-CM | POA: Insufficient documentation

## 2015-03-15 DIAGNOSIS — Z7901 Long term (current) use of anticoagulants: Secondary | ICD-10-CM | POA: Insufficient documentation

## 2015-03-15 DIAGNOSIS — C50812 Malignant neoplasm of overlapping sites of left female breast: Secondary | ICD-10-CM

## 2015-03-15 DIAGNOSIS — F419 Anxiety disorder, unspecified: Secondary | ICD-10-CM

## 2015-03-15 DIAGNOSIS — Z8542 Personal history of malignant neoplasm of other parts of uterus: Secondary | ICD-10-CM | POA: Diagnosis not present

## 2015-03-15 DIAGNOSIS — Z90722 Acquired absence of ovaries, bilateral: Secondary | ICD-10-CM | POA: Diagnosis not present

## 2015-03-15 DIAGNOSIS — Z86010 Personal history of colonic polyps: Secondary | ICD-10-CM | POA: Insufficient documentation

## 2015-03-15 DIAGNOSIS — N1330 Unspecified hydronephrosis: Secondary | ICD-10-CM | POA: Diagnosis not present

## 2015-03-15 DIAGNOSIS — C7951 Secondary malignant neoplasm of bone: Secondary | ICD-10-CM | POA: Diagnosis not present

## 2015-03-15 DIAGNOSIS — M1990 Unspecified osteoarthritis, unspecified site: Secondary | ICD-10-CM | POA: Diagnosis not present

## 2015-03-15 DIAGNOSIS — Z7902 Long term (current) use of antithrombotics/antiplatelets: Secondary | ICD-10-CM | POA: Diagnosis not present

## 2015-03-15 DIAGNOSIS — R109 Unspecified abdominal pain: Secondary | ICD-10-CM

## 2015-03-15 DIAGNOSIS — C55 Malignant neoplasm of uterus, part unspecified: Secondary | ICD-10-CM

## 2015-03-15 LAB — MAGNESIUM: MAGNESIUM: 2.1 mg/dL (ref 1.7–2.4)

## 2015-03-15 LAB — CBC WITH DIFFERENTIAL/PLATELET
BASOS ABS: 0 10*3/uL (ref 0–0.1)
BASOS PCT: 1 %
EOS ABS: 0.2 10*3/uL (ref 0–0.7)
EOS PCT: 6 %
HCT: 40.1 % (ref 35.0–47.0)
Hemoglobin: 13.6 g/dL (ref 12.0–16.0)
Lymphocytes Relative: 21 %
Lymphs Abs: 0.7 10*3/uL — ABNORMAL LOW (ref 1.0–3.6)
MCH: 32.3 pg (ref 26.0–34.0)
MCHC: 33.8 g/dL (ref 32.0–36.0)
MCV: 95.5 fL (ref 80.0–100.0)
MONO ABS: 0.3 10*3/uL (ref 0.2–0.9)
Monocytes Relative: 10 %
Neutro Abs: 2.1 10*3/uL (ref 1.4–6.5)
Neutrophils Relative %: 62 %
PLATELETS: 211 10*3/uL (ref 150–440)
RBC: 4.2 MIL/uL (ref 3.80–5.20)
RDW: 13.9 % (ref 11.5–14.5)
WBC: 3.5 10*3/uL — ABNORMAL LOW (ref 3.6–11.0)

## 2015-03-15 LAB — COMPREHENSIVE METABOLIC PANEL
ALBUMIN: 3.8 g/dL (ref 3.5–5.0)
ALT: 11 U/L — ABNORMAL LOW (ref 14–54)
AST: 15 U/L (ref 15–41)
Alkaline Phosphatase: 45 U/L (ref 38–126)
Anion gap: 5 (ref 5–15)
BUN: 24 mg/dL — AB (ref 6–20)
CHLORIDE: 107 mmol/L (ref 101–111)
CO2: 22 mmol/L (ref 22–32)
Calcium: 8.7 mg/dL — ABNORMAL LOW (ref 8.9–10.3)
Creatinine, Ser: 1.1 mg/dL — ABNORMAL HIGH (ref 0.44–1.00)
GFR calc Af Amer: 52 mL/min — ABNORMAL LOW (ref >60)
GFR, EST NON AFRICAN AMERICAN: 45 mL/min — AB (ref >60)
Glucose, Bld: 109 mg/dL — ABNORMAL HIGH (ref 65–99)
POTASSIUM: 3.7 mmol/L (ref 3.5–5.1)
SODIUM: 134 mmol/L — AB (ref 135–145)
Total Bilirubin: 0.7 mg/dL (ref 0.3–1.2)
Total Protein: 6.5 g/dL (ref 6.5–8.1)

## 2015-03-15 MED ORDER — DENOSUMAB 120 MG/1.7ML ~~LOC~~ SOLN
120.0000 mg | Freq: Once | SUBCUTANEOUS | Status: AC
  Administered 2015-03-15: 120 mg via SUBCUTANEOUS

## 2015-03-15 MED ORDER — SODIUM CHLORIDE 0.9 % IV SOLN: Freq: Once | INTRAVENOUS | Status: DC | PRN

## 2015-03-15 MED ORDER — DIPHENHYDRAMINE HCL 50 MG/ML IJ SOLN: 25.0000 mg | Freq: Once | INTRAMUSCULAR | Status: DC | PRN

## 2015-03-15 MED ORDER — GABAPENTIN 300 MG PO CAPS: 300.0000 mg | ORAL_CAPSULE | Freq: Three times a day (TID) | ORAL | Status: DC

## 2015-03-15 MED ORDER — APIXABAN 5 MG PO TABS: 5.0000 mg | ORAL_TABLET | Freq: Two times a day (BID) | ORAL | Status: DC

## 2015-03-15 MED ORDER — MELOXICAM 15 MG PO TABS: 15.0000 mg | ORAL_TABLET | Freq: Every day | ORAL | Status: DC

## 2015-03-15 MED ORDER — ALBUTEROL SULFATE (2.5 MG/3ML) 0.083% IN NEBU: 2.5000 mg | INHALATION_SOLUTION | Freq: Once | RESPIRATORY_TRACT | Status: DC | PRN

## 2015-03-15 MED ORDER — DIPHENHYDRAMINE HCL 50 MG/ML IJ SOLN: 50.0000 mg | Freq: Once | INTRAMUSCULAR | Status: DC | PRN

## 2015-03-15 NOTE — Progress Notes (Signed)
Patient had stint replaced last week in right kidney.  States she is still having pain.  Also states her left hip is painful - mostly at night when she lies down.

## 2015-03-15 NOTE — Telephone Encounter (Signed)
Notified pt of surgery scheduled 06/09/15, pre-admit testing phone interview on 4/26 between 9am-1pm and RTC for ua & ucx on 4/24. Pt voices understanding.

## 2015-03-15 NOTE — Progress Notes (Signed)
[place] @ [facility] Telephone:(336) [phone]  Fax:(336) [phone]  Progress note  Traci [name] OB: Jul 27, 1929  MR#: [id number]  VHQ#:[id number]  Patient Care Team: Idelle Crouch, MD as PCP - General (Unknown Physician Specialty) [name], MD as Referring Physician (Surgery) [name], MD as Consulting Physician (Urology)  CHIEF COMPLAINT:  Chief Complaint  Patient presents with  . Endometrial Cancer  1/  VISIT DIAGNOSIS:     ICD-9-CM ICD-10-CM   1. Endometrial cancer (HCC) 182.0 C54.1 meloxicam (MOBIC) 15 MG tablet     apixaban (ELIQUIS) 5 MG TABS tablet     CBC with Differential     Comprehensive metabolic panel  2. Malignant neoplasm of left female breast, unspecified site of breast (HCC) 174.9 C50.912 meloxicam (MOBIC) 15 MG tablet     apixaban (ELIQUIS) 5 MG TABS tablet     CBC with Differential     Comprehensive metabolic panel  3. Deep vein thrombosis (DVT) of lower extremity, unspecified chronicity, unspecified laterality, unspecified vein (HCC) 453.40 I82.409 meloxicam (MOBIC) 15 MG tablet     apixaban (ELIQUIS) 5 MG TABS tablet     CBC with Differential     Comprehensive metabolic panel    Oncology History   1.  Carcinoma of left breast  IMPRESSION: Ultrasound-guided biopsy of a suspicious left breast mass at 9 o'clock. No apparent complications.   2.  Estrogen receptor positive.  Progesterone receptor positive.  HER-2 receptor 2+ by  IHC . Fish is pending  5 mm tumor clinically stage IB N0 M0 tumor   3.  Patient has a previous history of endometrium  carcinoma of endometrium stage IB disease status post bilateral salpingo-oophorectomy and radiation therapy 4.  Right hydronephrosis detected on MRI scan off lumbosacral spine.  Cystoscopy with attempted stent placement revealed bladder tumor in September of 2016 biopsies positive for recurrent endometrial cancer  5.  Patient had  stent placement in the right kidney Started radiation  therapy 6.  Patient has finished radiation therapy with relief in the pain (November, 2016) 7.  Started on Megace in December of 2016 8 deep vein thrombosis in the left lower extremity (February 08, 2014) Hematuria due to   xeralto IVC filter placement on February 10, 2014 She  is off xeralto  9.condition recently had a cystoscopy no evidence of bleeding was found so patient has been started on  ELOQUIS (March 15, 2015)  Oncology Flowsheet 04/08/2011 10/28/2014 02/10/2015 03/10/2015 03/15/2015  denosumab (XGEVA) [name] - - - - 120 mg  dexamethasone (DECADRON) IJ - - - - -  enoxaparin (LOVENOX) [place] 40 mg - 1 mg/kg - -  ondansetron (ZOFRAN) IV - - - - -    INTERVAL HISTORY:  82 year old lady came today further follow-up patient has 5 mm infiltrating ductal carcinoma of breast during the workup patient was found to have recurrent endometrial cancer with extensive retroperitoneal adenopathy and pelvic wall invasion and bladder invasion started on palliative radiation therapy  Patient has significant improvement in pain.  Patient has been scheduled for cystoscopy and change in a stent.  There was some question about discontinuing Plavix.  Patient has been started on Plavix with a previous history of TIA which happened many years ago. Here for further follow-up and treatment consideration  THIS AND HAS INTERMITTENT PAIN ON THE RIGHT HIP AS WELL AS LEFT HIP.  pATIENT HAD A CYSTOSCOPY AND STENT CHANGE.  sHE WAS ASKED TO STOP pLAVIX AND STARTED ON ANTICOAGULATION.  pATIENT IS  ALSO CONCERNED ABOUT HER BREAST CANCER      REVIEW OF SYSTEMS:   GENERAL:  Feels good.  Active.  No fevers, sweats or weight loss. PERFORMANCE STATUS (ECOG):  01 HEENT:  No visual changes, runny nose, sore throat, mouth sores or tenderness. Lungs: No shortness of breath or cough.  No hemoptysis. Cardiac:  No chest pain, palpitations, orthopnea, or PND. Right flank pain.  Right low back pain.  Right pain radiating down her right lower  extremity.  Recent cystoscopy is consistent with bladder tumor.  Biopsies positive for endometrial cancer  Musculoskeletal:  No back pain.  No joint pain.  No muscle tenderness. Extremities:  No pain or swelling. Skin:  No rashes or skin changes. Neuro:  No headache, numbness or weakness, balance or coordination issues. Endocrine:  No diabetes, thyroid issues, hot flashes or night sweats. Psych:  No mood changes, depression or anxiety. Pain: Significant relief in the pain Will try to taper off Patient from pain medication, monitoring pain  Review of systems:  All other systems reviewed and found to be negative.  As per HPI. Otherwise, a complete review of systems is negatve.  PAST MEDICAL HISTORY: Past Medical History  Diagnosis Date  . History of esophageal stricture   . GERD (gastroesophageal reflux disease)   . Inguinal lymphocyst   . History of brachytherapy   . History of shingles   . History of colon polyps   . Mild aphasia   . Hearing loss   . History of DVT (deep vein thrombosis)   . Stroke ([place]) 04/06/2011    residual:  aphasia  . Hiccups   . Pain     SEVERE CHRONIC RIGHT LEG,HIP,FOOT  . Chronic kidney disease     HYDRONEPHROSIS  . Endometrial adenocarcinoma ([place]) 01/2012     stage Ib, grade 1, positive peritoneal cytology, no other risk factors or extra-uterine disease  . Cancer of left breast ([place]) 10/20/2014  . Bone cancer ([place])   . Arthritis     Degenerative Disc Disease  . Gastric ulcer   . Deep vein blood clot of left lower extremity ([place]) February 11, 2015    Left Leg    PAST SURGICAL HISTORY: Past Surgical History  Procedure Laterality Date  . Esophageal dilation    . Thyroidectomy, partial    . Cataract extraction w/ intraocular lens implant      right  . Back surgery      fusion of L3&4  . Tonsillectomy    . Total abdominal hysterectomy w/ bilateral salpingoophorectomy Bilateral     staging biopsies  . Breast cyst aspiration N/A   . Breast  biopsy Left 10/13/2014    path pending  . Cystoscopy w/ retrogrades Right 10/28/2014    Procedure: CYSTOSCOPY WITH RETROGRADE PYELOGRAM ATTEMPT, UNABLE TO PASS ;  Surgeon: [name], MD;  Location: [facility] ORS;  Service: Urology;  Laterality: Right;  . Cystoscopy with stent placement Right 10/28/2014    Procedure: BLADDER BIOPSY WITH FULGERATION ;  Surgeon: [name], MD;  Location: [facility] ORS;  Service: Urology;  Laterality: Right;  . Thyroid removed    . Eye surgery Bilateral     Cataract Extraction  . Portacath placement Right 11/26/2014    Procedure: INSERTION PORT-A-CATH;  Surgeon: [name], MD;  Location: [facility] ORS;  Service: General;  Laterality: Right;  . Peripheral vascular catheterization N/A 02/11/2015    Procedure: IVC Filter Insertion;  Surgeon: Algernon Huxley, MD;  Location: [facility]  INVASIVE CV LAB;  Service: Cardiovascular;  Laterality: N/A;  . Abdominal hysterectomy  December 2013    history of cervical cancer  . Cystoscopy w/ ureteral stent placement Right 03/10/2015    Procedure: CYSTOSCOPY WITH STENT REPLACEMENT;  Surgeon: [name], MD;  Location: [facility] ORS;  Service: Urology;  Laterality: Right;    FAMILY HISTORY Family History  Problem Relation Age of Onset  . Stroke Mother   . Breast cancer Maternal Aunt     x 2        ADVANCED DIRECTIVES:  Patient does not have any living will or healthcare power of attorney.  Information was given .  Available resources had been discussed.  We will follow-up on subsequent appointments regarding this issue  HEALTH MAINTENANCE: Social History  Substance Use Topics  . Smoking status: Never Smoker   . Smokeless tobacco: Never Used  . Alcohol Use: No      Allergies  Allergen Reactions  . Penicillins Rash  . Hydrocodone-Acetaminophen Nausea Only  . Novocain [Procaine] Other (See Comments) and Nausea And Vomiting    Chest pain  . Sulfa Antibiotics Other (See Comments)    CHEST PAIN    Current  Outpatient Prescriptions  Medication Sig Dispense Refill  . fentaNYL (DURAGESIC - DOSED MCG/HR) 50 MCG/HR Place 1 patch (50 mcg total) onto the skin every 3 (three) days. 10 patch 0  . megestrol (MEGACE) 40 MG tablet Take 1 tablet (40 mg total) by mouth 4 (four) times daily. 120 tablet 3  . meloxicam (MOBIC) 15 MG tablet Take 15 mg by mouth daily.    . Multiple Vitamins-Minerals (MULTIVITAMIN WITH MINERALS) tablet Take 1 tablet by mouth daily.    Marland Kitchen oxyCODONE-acetaminophen (PERCOCET/ROXICET) 5-325 MG tablet Take 1-2 tablets by mouth every 4 (four) hours as needed for severe pain. 90 tablet 0  . apixaban (ELIQUIS) 5 MG TABS tablet Take 1 tablet (5 mg total) by mouth 2 (two) times daily. 60 tablet 3  . gabapentin (NEURONTIN) 300 MG capsule Take 1 capsule (300 mg total) by mouth 3 (three) times daily. 90 capsule 2  . meloxicam (MOBIC) 15 MG tablet Take 1 tablet (15 mg total) by mouth daily. 30 tablet 2   No current facility-administered medications for this visit.   Facility-Administered Medications Ordered in Other Visits  Medication Dose Route Frequency Provider Last Rate Last Dose  . albuterol (PROVENTIL) (2.5 MG/3ML) 0.083% nebulizer solution 2.5 mg  2.5 mg Nebulization Once PRN Forest Gleason, MD        OBJECTIVE: PHYSICAL EXAM: GENERAL:  Well developed, well nourished, sitting comfortably in the exam room in no acute distress. MENTAL STATUS:  Alert and oriented to person, place and time.  ENT:  Oropharynx clear without lesion.  Tongue normal. Mucous membranes moist.  RESPIRATORY:  Clear to auscultation without rales, wheezes or rhonchi. CARDIOVASCULAR:  Regular rate and rhythm without murmur, rub or gallop.  ABDOMEN:  Soft, non-tender, with active bowel sounds, and no hepatosplenomegaly.  No masses. BACK:   CVA tenderness.  No tenderness on percussion of the back or rib cage. SKIN:  No rashes, ulcers or lesions. EXTREMITIES: Left lower extremity edema LYMPH NODES: No palpable cervical,  supraclavicular, axillary or inguinal adenopathy  NEUROLOGICAL: Unremarkable. PSYCH:  Appropriate.  Filed Vitals:   03/15/15 1152  Pulse: 69  Temp: 97.4 F (36.3 C)  Resp: 18     Body mass index is 21.29 kg/(m^2).    ECOG FS:1 - Symptomatic but completely ambulatory  LAB  RESULTS:  CBC Latest Ref Rng 03/15/2015 03/02/2015 02/16/2015  WBC 3.6 - 11.0 K/uL 3.5(L) 4.8 6.1  Hemoglobin 12.0 - 16.0 g/dL 13.6 13.3 13.5  Hematocrit 35.0 - 47.0 % 40.1 39.1 40.3  Platelets 150 - 440 K/uL 211 240 262    STUDIES: No results found.  ASSESSMENT:  Carcinoma of left breast at 9:00 position clinically stage is T1b N0 M0 tumor estrogen receptor positive progesterone receptor positive HER-2/neu receptor pending by fish Breast cancer surgery has been put on hold because of diagnosis of endometrial cancer 2.pLAVIX WILL BE DISCONTINUED. Patient will be started on Eloquis .  5 mg twice a day. Patient is extremely apprehensive regarding breast cancer even though is a small tumor and also patient has underlying endometrial cancer I have referred her back to Dr. Rochel Brome for possibility of lumpectomy and sentinel lymph node biopsy. Then possibility of considering patient's for anti-hormonal therapy Pros and cons of local radiation therapy would be discussed.  I would lean towards not doing radiation therapy because of patient's underlying  Metastatic endometrial cancer  2.  But case will be discussed in tumor conference  3. She   will be started on XGEVA because of bone metastases If pain continues then a bone scan can be done. 's and has been referred to Dr. Rochel Brome

## 2015-03-16 ENCOUNTER — Encounter: Payer: Self-pay | Admitting: *Deleted

## 2015-03-16 NOTE — Progress Notes (Signed)
  Oncology Nurse Navigator Documentation  Navigator Location: [facility] (03/16/15 1500) Navigator Encounter Type: Letter/Fax/Email (card) (03/16/15 1500)                                          Time Spent with Patient: 15 (03/16/15 1500)   Thinking of you card mailed to patient. 

## 2015-03-17 ENCOUNTER — Other Ambulatory Visit: Payer: Self-pay | Admitting: Surgery

## 2015-03-17 DIAGNOSIS — C50912 Malignant neoplasm of unspecified site of left female breast: Secondary | ICD-10-CM

## 2015-03-18 ENCOUNTER — Telehealth: Payer: Self-pay | Admitting: *Deleted

## 2015-03-18 ENCOUNTER — Other Ambulatory Visit: Payer: Self-pay | Admitting: Surgery

## 2015-03-18 DIAGNOSIS — C50912 Malignant neoplasm of unspecified site of left female breast: Secondary | ICD-10-CM

## 2015-03-18 NOTE — Telephone Encounter (Signed)
Called patient to let her know her appointment can be changed to 04-06-15.  One of our schedulers will call her with that appointment.  Patient verbalized understanding.

## 2015-03-18 NOTE — Telephone Encounter (Signed)
Patient saw Dr. Rochel Brome yesterday and he can do her surgery on 2-21st.  Patient has an appointment with Dr. Oliva Bustard on same day and wants to know if that appointment can be changed to the 28th.

## 2015-03-18 NOTE — Telephone Encounter (Signed)
Ok to move appt.

## 2015-03-19 ENCOUNTER — Inpatient Hospital Stay: Payer: Medicare Other | Admitting: Internal Medicine

## 2015-03-19 ENCOUNTER — Inpatient Hospital Stay: Payer: Medicare Other

## 2015-03-19 ENCOUNTER — Other Ambulatory Visit: Payer: Self-pay | Admitting: *Deleted

## 2015-03-19 ENCOUNTER — Emergency Department
Admission: EM | Admit: 2015-03-19 | Discharge: 2015-03-19 | Disposition: A | Discharge status: Home / Self Care | Payer: Medicare Other | Attending: Emergency Medicine | Admitting: Emergency Medicine

## 2015-03-19 ENCOUNTER — Telehealth: Payer: Self-pay | Admitting: *Deleted

## 2015-03-19 ENCOUNTER — Encounter: Payer: Self-pay | Admitting: Emergency Medicine

## 2015-03-19 VITALS — BP 129/73 | HR 64 | Temp 96.7°F | Resp 18 | Ht 64.0 in | Wt 124.9 lb

## 2015-03-19 DIAGNOSIS — C541 Malignant neoplasm of endometrium: Secondary | ICD-10-CM | POA: Diagnosis not present

## 2015-03-19 DIAGNOSIS — R319 Hematuria, unspecified: Secondary | ICD-10-CM

## 2015-03-19 DIAGNOSIS — Z79899 Other long term (current) drug therapy: Secondary | ICD-10-CM | POA: Insufficient documentation

## 2015-03-19 DIAGNOSIS — Z880 Allergy status to penicillin: Secondary | ICD-10-CM | POA: Insufficient documentation

## 2015-03-19 DIAGNOSIS — Z791 Long term (current) use of non-steroidal anti-inflammatories (NSAID): Secondary | ICD-10-CM | POA: Diagnosis not present

## 2015-03-19 LAB — COMPREHENSIVE METABOLIC PANEL
ALBUMIN: 3.8 g/dL (ref 3.5–5.0)
ALK PHOS: 40 U/L (ref 38–126)
ALT: 12 U/L — AB (ref 14–54)
AST: 16 U/L (ref 15–41)
Anion gap: 6 (ref 5–15)
BUN: 28 mg/dL — ABNORMAL HIGH (ref 6–20)
CALCIUM: 8 mg/dL — AB (ref 8.9–10.3)
CO2: 23 mmol/L (ref 22–32)
CREATININE: 1.22 mg/dL — AB (ref 0.44–1.00)
Chloride: 109 mmol/L (ref 101–111)
GFR, EST AFRICAN AMERICAN: 46 mL/min — AB (ref >60)
GFR, EST NON AFRICAN AMERICAN: 40 mL/min — AB (ref >60)
Glucose, Bld: 113 mg/dL — ABNORMAL HIGH (ref 65–99)
Potassium: 4.1 mmol/L (ref 3.5–5.1)
SODIUM: 138 mmol/L (ref 135–145)
Total Bilirubin: 0.3 mg/dL (ref 0.3–1.2)
Total Protein: 6.5 g/dL (ref 6.5–8.1)

## 2015-03-19 LAB — URINALYSIS COMPLETE WITH MICROSCOPIC (ARMC ONLY)
BACTERIA UA: NONE SEEN
BACTERIA UA: NONE SEEN
Bilirubin Urine: NEGATIVE
Bilirubin Urine: NEGATIVE
GLUCOSE, UA: NEGATIVE mg/dL
GLUCOSE, UA: NEGATIVE mg/dL
Ketones, ur: NEGATIVE mg/dL
NITRITE: NEGATIVE
Nitrite: NEGATIVE
PROTEIN: 100 mg/dL — AB
SPECIFIC GRAVITY, URINE: 1.025 (ref 1.005–1.030)
SPECIFIC GRAVITY, URINE: 1.025 (ref 1.005–1.030)
SQUAMOUS EPITHELIAL / LPF: NONE SEEN
pH: 6 (ref 5.0–8.0)
pH: 5 (ref 5.0–8.0)

## 2015-03-19 LAB — CBC WITH DIFFERENTIAL/PLATELET
BASOS PCT: 1 %
Basophils Absolute: 0 10*3/uL (ref 0–0.1)
EOS ABS: 0.2 10*3/uL (ref 0–0.7)
Eosinophils Relative: 4 %
HCT: 37.8 % (ref 35.0–47.0)
HEMOGLOBIN: 13 g/dL (ref 12.0–16.0)
Lymphocytes Relative: 16 %
Lymphs Abs: 0.7 10*3/uL — ABNORMAL LOW (ref 1.0–3.6)
MCH: 32.6 pg (ref 26.0–34.0)
MCHC: 34.3 g/dL (ref 32.0–36.0)
MCV: 95.1 fL (ref 80.0–100.0)
Monocytes Absolute: 0.5 10*3/uL (ref 0.2–0.9)
Monocytes Relative: 10 %
NEUTROS PCT: 69 %
Neutro Abs: 3.2 10*3/uL (ref 1.4–6.5)
Platelets: 209 10*3/uL (ref 150–440)
RBC: 3.98 MIL/uL (ref 3.80–5.20)
RDW: 14 % (ref 11.5–14.5)
WBC: 4.6 10*3/uL (ref 3.6–11.0)

## 2015-03-19 NOTE — Telephone Encounter (Signed)
Per Dr Rudean Hitt, stop the Eliquis and come in to be seen today. Pt informed and states she has already stopped the eliquis and agrees to come in at 145 for lab, 200 for MD

## 2015-03-19 NOTE — ED Notes (Signed)
Husband states pt started Eliquis yesterday. Pt states she took medication twice yesterday and then noticed the blood in her urine. Pt has been on blood thinners for history of blood clots and was switched to the Eliquis yesterday. Pt was seen at cancer center today and sent over here for further evaluation.

## 2015-03-19 NOTE — Progress Notes (Signed)
Patient was seen emergently today in the clinic for hematuria with blood clots starting yesterday afternoon. Patient was started on eliquis the day before the symptoms occurred. Patient denies fevers, back pain, abdominal pain. Out of concern for potential urinary obstruction with blood clots, especially since patient recently had a urinary stent placed, patient was directed to the emergency room. Eliquis was stopped today. Plavix to be stopped today. Patient will need ultrasound of the bladder and kidneys to rule out urinary obstruction. If there is no obstruction, and no urine blood clots are appreciated in the emergency room, patient can be discharged home. Hemoglobin appears to be reasonably stable, as well as renal function. Patient will be seen in our clinic next week. Traci [name] 03/19/2015  5:35 PM

## 2015-03-19 NOTE — Telephone Encounter (Signed)
New medicine is causing her to to urinate heavy blood. She was started on Eliquis on 2/6 for DVT

## 2015-03-19 NOTE — ED Notes (Signed)
Pt is a/o with NAD at this time.

## 2015-03-19 NOTE — ED Provider Notes (Signed)
[facility] Emergency Department Provider Note   ____________________________________________  Time seen: Approximately 7pm I have reviewed the triage vital signs and the triage nursing note.  HISTORY  Chief Complaint Hematuria   Historian Patient  HPI Traci [name] is a 82 y.o. female who is here for gross bloody urine. No dysuria. No fever. No abdominal pain. No flank pain. No history of kidney stones. No frequency of urination. She did take the blood thinner Eloquis yesterday. She had had an episode of hematuria previously when she was on Xarelto and for that she was changed to Eloquis. Now that she has had hematuria on Eloquis, she stopped it today.  She has history of a stroke and was previously on clopidogrel. Since she was diagnosed with metastatic cancer, she was changed to Xarelto and then Eloquis to help for coverage of prevention of blood clots.  No chest pain or shortness of breath.    Past Medical History  Diagnosis Date  . History of esophageal stricture   . GERD (gastroesophageal reflux disease)   . Inguinal lymphocyst   . History of brachytherapy   . History of shingles   . History of colon polyps   . Mild aphasia   . Hearing loss   . History of DVT (deep vein thrombosis)   . Stroke ([place]) 04/06/2011    residual:  aphasia  . Hiccups   . Pain     SEVERE CHRONIC RIGHT LEG,HIP,FOOT  . Chronic kidney disease     HYDRONEPHROSIS  . Arthritis     Degenerative Disc Disease  . Gastric ulcer   . Deep vein blood clot of left lower extremity ([place]) February 11, 2015    Left Leg  . Endometrial adenocarcinoma ([place]) 01/2012     stage Ib, grade 1, positive peritoneal cytology, no other risk factors or extra-uterine disease  . Cancer of left breast ([place]) 10/20/2014  . Bone cancer [facility])     Patient Active Problem List   Diagnosis Date Noted  . Personal history of urinary infection 03/15/2015  . Cerebrovascular accident (CVA) ([place]) 02/18/2015   . Endometrial cancer ([place]) 11/18/2014  . Degeneration of intervertebral disc of lumbosacral region 11/14/2014  . Overflow incontinence 11/14/2014  . Pure hypercholesterolemia 11/14/2014  . Acquired hypothyroidism 10/20/2014  . DDD (degenerative disc disease), lumbosacral 10/20/2014  . Benign neoplasm of colon 10/20/2014  . Acid reflux 10/20/2014  . Esophageal stenosis 10/20/2014  . Bloodgood disease 10/20/2014  . History of colon polyps 10/20/2014  . H/O gastric ulcer 10/20/2014  . Healed or old pulmonary embolism 10/20/2014  . History of urinary anomaly 10/20/2014  . BP (high blood pressure) 10/20/2014  . Hypersomnia with sleep apnea 10/20/2014  . Multinodular goiter 10/20/2014  . Angina pectoris ([place]) 10/20/2014  . Seizure ([place]) 10/20/2014  . Digestive symptom 10/20/2014  . Incontinence overflow, urine 10/20/2014  . Dupuytren's contracture of foot 10/20/2014  . Hypercholesterolemia without hypertriglyceridemia 10/20/2014  . Cancer of left breast ([place]) 10/20/2014  . History of endometrial cancer 09/30/2014  . Lumbar radiculopathy 09/04/2014  . Deep vein thrombosis ([place]) 07/15/2013  . Deep vein thrombosis (DVT) ([place]) 07/15/2013  . Stroke [facility]) 04/07/2011    Past Surgical History  Procedure Laterality Date  . Esophageal dilation    . Thyroidectomy, partial    . Cataract extraction w/ intraocular lens implant      right  . Back surgery      fusion of L3&4  . Tonsillectomy    . Total  abdominal hysterectomy w/ bilateral salpingoophorectomy Bilateral     staging biopsies  . Breast cyst aspiration N/A   . Breast biopsy Left 10/13/2014    path pending  . Cystoscopy w/ retrogrades Right 10/28/2014    Procedure: CYSTOSCOPY WITH RETROGRADE PYELOGRAM ATTEMPT, UNABLE TO PASS ;  Surgeon: [name], MD;  Location: [facility] ORS;  Service: Urology;  Laterality: Right;  . Cystoscopy with stent placement Right 10/28/2014    Procedure: BLADDER BIOPSY WITH FULGERATION ;  Surgeon: [name], MD;  Location: [facility] ORS;  Service: Urology;  Laterality: Right;  . Thyroid removed    . Eye surgery Bilateral     Cataract Extraction  . Portacath placement Right 11/26/2014    Procedure: INSERTION PORT-A-CATH;  Surgeon: [name], MD;  Location: [facility] ORS;  Service: General;  Laterality: Right;  . Peripheral vascular catheterization N/A 02/11/2015    Procedure: IVC Filter Insertion;  Surgeon: Algernon Huxley, MD;  Location: [place] CV LAB;  Service: Cardiovascular;  Laterality: N/A;  . Abdominal hysterectomy  December 2013    history of cervical cancer  . Cystoscopy w/ ureteral stent placement Right 03/10/2015    Procedure: CYSTOSCOPY WITH STENT REPLACEMENT;  Surgeon: [name], MD;  Location: [facility] ORS;  Service: Urology;  Laterality: Right;    Current Outpatient Rx  Name  Route  Sig  Dispense  Refill  . fentaNYL (DURAGESIC - DOSED MCG/HR) 50 MCG/HR   Transdermal   Place 1 patch (50 mcg total) onto the skin every 3 (three) days.   10 patch   0   . gabapentin (NEURONTIN) 300 MG capsule   Oral   Take 1 capsule (300 mg total) by mouth 3 (three) times daily.   90 capsule   2   . megestrol (MEGACE) 40 MG tablet   Oral   Take 1 tablet (40 mg total) by mouth 4 (four) times daily.   120 tablet   3   . meloxicam (MOBIC) 15 MG tablet   Oral   Take 1 tablet (15 mg total) by mouth daily.   30 tablet   2   . Multiple Vitamins-Minerals (MULTIVITAMIN WITH MINERALS) tablet   Oral   Take 1 tablet by mouth daily.         Marland Kitchen oxyCODONE-acetaminophen (PERCOCET/ROXICET) 5-325 MG tablet   Oral   Take 1-2 tablets by mouth every 4 (four) hours as needed for severe pain.   90 tablet   0     Allergies Penicillins; Hydrocodone-acetaminophen; Novocain; and Sulfa antibiotics  Family History  Problem Relation Age of Onset  . Stroke Mother   . Breast cancer Maternal Aunt     x 2    Social History Social History  Substance Use Topics  . Smoking status:  Never Smoker   . Smokeless tobacco: Never Used  . Alcohol Use: No    Review of Systems  Constitutional: Negative for fever. Eyes: Negative for visual changes. ENT: Negative for sore throat. Cardiovascular: Negative for chest pain. Respiratory: Negative for shortness of breath. Gastrointestinal: Negative for abdominal pain, vomiting and diarrhea. Genitourinary: Negative for dysuria. Musculoskeletal: Negative for back pain. Skin: Negative for rash. Neurological: Negative for headache. 10 point Review of Systems otherwise negative ____________________________________________   PHYSICAL EXAM:  VITAL SIGNS: ED Triage Vitals  Enc Vitals Group     BP 03/19/15 1544 113/52 mmHg     Pulse Rate 03/19/15 1544 67     Resp 03/19/15 1544 18  Temp 03/19/15 1544 98.1 F (36.7 C)     Temp Source 03/19/15 1544 Oral     SpO2 03/19/15 1544 98 %     Weight 03/19/15 1544 124 lb (56.246 kg)     Height 03/19/15 1544 5\' 4"  (1.626 m)     Head Cir --      Peak Flow --      Pain Score 03/19/15 1545 0     Pain Loc --      Pain Edu? --      Excl. in [place]? --      Constitutional: Alert and oriented. Well appearing and in no distress. HEENT   Head: Normocephalic and atraumatic.      Eyes: Conjunctivae are normal. PERRL. Normal extraocular movements.      Ears:         Nose: No congestion/rhinnorhea.   Mouth/Throat: Mucous membranes are moist.   Neck: No stridor. Cardiovascular/Chest: Normal rate, regular rhythm.  No murmurs, rubs, or gallops. Respiratory: Normal respiratory effort without tachypnea nor retractions. Breath sounds are clear and equal bilaterally. No wheezes/rales/rhonchi. Gastrointestinal: Soft. No distention, no guarding, no rebound. Nontender.    Genitourinary/rectal:Deferred Musculoskeletal: Nontender with normal range of motion in all extremities. No joint effusions.  No lower extremity tenderness.  No edema. Neurologic:  Normal speech and language. No gross or  focal neurologic deficits are appreciated. Skin:  Skin is warm, dry and intact. No rash noted. Psychiatric: Mood and affect are normal. Speech and behavior are normal. Patient exhibits appropriate insight and judgment.  ____________________________________________   EKG I, Lisa Roca, MD, the attending physician have personally viewed and interpreted all ECGs.  None ____________________________________________  LABS (pertinent positives/negatives)  I reviewed labs from earlier today at the cancer center, stable hemoglobin and no acute renal failure. Urinalysis: 1+ leukocytes, too numerous to count red blood cells, too numerous to count white blood cells, no bacteria ____________________________________________  RADIOLOGY All Xrays were viewed by me. Imaging interpreted by Radiologist.  None __________________________________________  PROCEDURES  Procedure(s) performed: None  Critical Care performed: None  ____________________________________________   ED COURSE / ASSESSMENT AND PLAN  Pertinent labs & imaging results that were available during my care of the patient were reviewed by me and considered in my medical decision making (see chart for details).    Patient is here with a symptomatic hematuria, I suspect the source is blood thinner Eloquis. This is happened the patient before when she was on Xarelto. She will stop the Eloquis.  The patient states she never had a symptomatic hematuria on clopidogrel, and she wants to go back on this.    CONSULTATIONS:   None   Patient / Family / Caregiver informed of clinical course, medical decision-making process, and agree with plan.   I discussed return precautions, follow-up instructions, and discharged instructions with patient and/or family.   ___________________________________________   FINAL CLINICAL IMPRESSION(S) / ED DIAGNOSES   Final diagnoses:  Hematuria              Note: This  dictation was prepared with Dragon dictation. Any transcriptional errors that result from this process are unintentional   Lisa Roca, MD 03/19/15 [phone]

## 2015-03-19 NOTE — Progress Notes (Signed)
Pt reports noting blood in urine yesterday at 1430 blood was rusty and red in color.  Pt reports not seeing in her underwear only in urine. Blood has remained in urine with clots less than the size of end of pen cap.  Color bright red per pt.

## 2015-03-19 NOTE — Discharge Instructions (Signed)
Your asymptomatic bloody urine I suspect is due to being on the blood thinner Eloquis. Stop his blood thinner. As we discussed, after you were no longer having bloody urine, you could go back to the clopidigrel.  Return to the emergency department for any symptoms including back pain or abdominal pain, dizziness or passing out, or any other symptoms concerning to you.   Hematuria, Adult Hematuria is blood in your urine. It can be caused by a bladder infection, kidney infection, prostate infection, kidney stone, or cancer of your urinary tract. Infections can usually be treated with medicine, and a kidney stone usually will pass through your urine. If neither of these is the cause of your hematuria, further workup to find out the reason may be needed. It is very important that you tell your health care provider about any blood you see in your urine, even if the blood stops without treatment or happens without causing pain. Blood in your urine that happens and then stops and then happens again can be a symptom of a very serious condition. Also, pain is not a symptom in the initial stages of many urinary cancers. HOME CARE INSTRUCTIONS   Drink lots of fluid, 3-4 quarts a day. If you have been diagnosed with an infection, cranberry juice is especially recommended, in addition to large amounts of water.  Avoid caffeine, tea, and carbonated beverages because they tend to irritate the bladder.  Avoid alcohol because it may irritate the prostate.  Take all medicines as directed by your health care provider.  If you were prescribed an antibiotic medicine, finish it all even if you start to feel better.  If you have been diagnosed with a kidney stone, follow your health care provider's instructions regarding straining your urine to catch the stone.  Empty your bladder often. Avoid holding urine for long periods of time.  After a bowel movement, women should cleanse front to back. Use each tissue only  once.  Empty your bladder before and after sexual intercourse if you are a female. SEEK MEDICAL CARE IF:  You develop back pain.  You have a fever.  You have a feeling of sickness in your stomach (nausea) or vomiting.  Your symptoms are not better in 3 days. Return sooner if you are getting worse. SEEK IMMEDIATE MEDICAL CARE IF:   You develop severe vomiting and are unable to keep the medicine down.  You develop severe back or abdominal pain despite taking your medicines.  You begin passing a large amount of blood or clots in your urine.  You feel extremely weak or faint, or you pass out. MAKE SURE YOU:   Understand these instructions.  Will watch your condition.  Will get help right away if you are not doing well or get worse.   This information is not intended to replace advice given to you by your health care provider. Make sure you discuss any questions you have with your health care provider.   Document Released: 01/23/2005 Document Revised: 02/13/2014 Document Reviewed: 09/23/2012 Elsevier Interactive Patient Education [employer].

## 2015-03-22 ENCOUNTER — Telehealth: Payer: Self-pay | Admitting: *Deleted

## 2015-03-22 ENCOUNTER — Encounter
Admission: RE | Admit: 2015-03-22 | Discharge: 2015-03-22 | Disposition: A | Discharge status: Home / Self Care | Payer: Medicare Other | Source: Ambulatory Visit | Attending: Surgery | Admitting: Surgery

## 2015-03-22 DIAGNOSIS — Z01812 Encounter for preprocedural laboratory examination: Secondary | ICD-10-CM | POA: Insufficient documentation

## 2015-03-22 MED ORDER — OXYCODONE-ACETAMINOPHEN 5-325 MG PO TABS
1.0000 | ORAL_TABLET | ORAL | Status: DC | PRN
Start: 2015-03-22 — End: 2015-04-15

## 2015-03-22 NOTE — Patient Instructions (Signed)
  Your procedure is scheduled on: Tuesday Feb. 21, 2017. Report to [facility] at 10:00am. Remember: Instructions that are not followed completely may result in serious medical risk, up to and including death, or upon the discretion of your surgeon and anesthesiologist your surgery may need to be rescheduled.    _x___ 1. Do not eat food or drink liquids after midnight. No gum chewing or hard candies.     ____ 2. No Alcohol for 24 hours before or after surgery.   ____ 3. Bring all medications with you on the day of surgery if instructed.    __x__ 4. Notify your doctor if there is any change in your medical condition     (cold, fever, infections).     Do not wear jewelry, make-up, hairpins, clips or nail polish.  Do not wear lotions, powders, or perfumes. You may wear deodorant.  Do not shave 48 hours prior to surgery. Men may shave face and neck.  Do not bring valuables to the hospital.    [facility] is not responsible for any belongings or valuables.               Contacts, dentures or bridgework may not be worn into surgery.  Leave your suitcase in the car. After surgery it may be brought to your room.  For patients admitted to the hospital, discharge time is determined by your treatment team.   Patients discharged the day of surgery will not be allowed to drive home.    Please read over the following fact sheets that you were given:   [facility] Preparing for Surgery  __x__ Take these medicines the morning of surgery with A SIP OF WATER:    1. gabapentin (NEURONTIN)  2. megestrol (MEGACE)   ____ Fleet Enema (as directed)   _x___ Use CHG Soap as directed  ____ Use inhalers on the day of surgery  ____ Stop metformin 2 days prior to surgery    ____ Take 1/2 of usual insulin dose the night before surgery and none on the morning of surgery.   ____ Stop Coumadin/Plavix/aspirin on does not apply.  _x___ Stop meloxicam [facility]) as directed by Dr. Tamala Julian.   ____  Stop supplements until after surgery.    ____ Bring C-Pap to the hospital.

## 2015-03-22 NOTE — Telephone Encounter (Signed)
Informed that prescription is ready to pick up They will check with Dr Tamala Julian regarding when she is to stop blood thinners

## 2015-03-25 ENCOUNTER — Other Ambulatory Visit: Payer: Self-pay | Admitting: *Deleted

## 2015-03-25 DIAGNOSIS — C541 Malignant neoplasm of endometrium: Secondary | ICD-10-CM

## 2015-03-25 MED ORDER — FENTANYL 50 MCG/HR TD PT72: 50.0000 ug | MEDICATED_PATCH | TRANSDERMAL | Status: DC

## 2015-03-25 NOTE — Telephone Encounter (Signed)
Rx shredded, too early by 1 week and pt informed that her insurance will not cover it until it is due. She said she is having surgery Tuesday and was trying to get things in order, but said she understands

## 2015-03-30 ENCOUNTER — Ambulatory Visit: Payer: Medicare Other | Admitting: Oncology

## 2015-03-30 ENCOUNTER — Ambulatory Visit
Admission: RE | Admit: 2015-03-30 | Discharge: 2015-03-30 | Disposition: A | Discharge status: Home / Self Care | Payer: Medicare Other | Source: Ambulatory Visit | Attending: Surgery | Admitting: Surgery

## 2015-03-30 ENCOUNTER — Ambulatory Visit: Payer: Medicare Other | Admitting: Certified Registered Nurse Anesthetist

## 2015-03-30 ENCOUNTER — Other Ambulatory Visit: Payer: Medicare Other

## 2015-03-30 ENCOUNTER — Encounter
Admission: RE | Admit: 2015-03-30 | Discharge: 2015-03-30 | Disposition: A | Discharge status: Home / Self Care | Payer: Medicare Other | Source: Ambulatory Visit | Attending: Surgery | Admitting: Surgery

## 2015-03-30 ENCOUNTER — Encounter
Admission: RE | Disposition: A | Discharge status: Home / Self Care | Payer: Self-pay | Source: Ambulatory Visit | Attending: Surgery

## 2015-03-30 ENCOUNTER — Encounter: Payer: Self-pay | Admitting: *Deleted

## 2015-03-30 DIAGNOSIS — Z86711 Personal history of pulmonary embolism: Secondary | ICD-10-CM | POA: Diagnosis not present

## 2015-03-30 DIAGNOSIS — Z888 Allergy status to other drugs, medicaments and biological substances status: Secondary | ICD-10-CM | POA: Diagnosis not present

## 2015-03-30 DIAGNOSIS — Z885 Allergy status to narcotic agent status: Secondary | ICD-10-CM | POA: Diagnosis not present

## 2015-03-30 DIAGNOSIS — Z880 Allergy status to penicillin: Secondary | ICD-10-CM | POA: Insufficient documentation

## 2015-03-30 DIAGNOSIS — Z170 Estrogen receptor positive status [ER+]: Secondary | ICD-10-CM | POA: Diagnosis not present

## 2015-03-30 DIAGNOSIS — C50912 Malignant neoplasm of unspecified site of left female breast: Secondary | ICD-10-CM | POA: Diagnosis present

## 2015-03-30 DIAGNOSIS — Z79899 Other long term (current) drug therapy: Secondary | ICD-10-CM | POA: Diagnosis not present

## 2015-03-30 DIAGNOSIS — F329 Major depressive disorder, single episode, unspecified: Secondary | ICD-10-CM | POA: Diagnosis not present

## 2015-03-30 DIAGNOSIS — E890 Postprocedural hypothyroidism: Secondary | ICD-10-CM | POA: Diagnosis not present

## 2015-03-30 DIAGNOSIS — Z8541 Personal history of malignant neoplasm of cervix uteri: Secondary | ICD-10-CM | POA: Insufficient documentation

## 2015-03-30 DIAGNOSIS — Z86010 Personal history of colonic polyps: Secondary | ICD-10-CM | POA: Diagnosis not present

## 2015-03-30 DIAGNOSIS — M722 Plantar fascial fibromatosis: Secondary | ICD-10-CM | POA: Insufficient documentation

## 2015-03-30 DIAGNOSIS — K219 Gastro-esophageal reflux disease without esophagitis: Secondary | ICD-10-CM | POA: Insufficient documentation

## 2015-03-30 DIAGNOSIS — Z8711 Personal history of peptic ulcer disease: Secondary | ICD-10-CM | POA: Diagnosis not present

## 2015-03-30 DIAGNOSIS — G4730 Sleep apnea, unspecified: Secondary | ICD-10-CM | POA: Insufficient documentation

## 2015-03-30 DIAGNOSIS — M5116 Intervertebral disc disorders with radiculopathy, lumbar region: Secondary | ICD-10-CM | POA: Diagnosis not present

## 2015-03-30 DIAGNOSIS — K222 Esophageal obstruction: Secondary | ICD-10-CM | POA: Insufficient documentation

## 2015-03-30 DIAGNOSIS — Z882 Allergy status to sulfonamides status: Secondary | ICD-10-CM | POA: Insufficient documentation

## 2015-03-30 DIAGNOSIS — Z884 Allergy status to anesthetic agent status: Secondary | ICD-10-CM | POA: Diagnosis not present

## 2015-03-30 DIAGNOSIS — Z8673 Personal history of transient ischemic attack (TIA), and cerebral infarction without residual deficits: Secondary | ICD-10-CM | POA: Diagnosis not present

## 2015-03-30 DIAGNOSIS — G4710 Hypersomnia, unspecified: Secondary | ICD-10-CM | POA: Diagnosis not present

## 2015-03-30 DIAGNOSIS — N63 Unspecified lump in breast: Secondary | ICD-10-CM | POA: Diagnosis present

## 2015-03-30 HISTORY — PX: PARTIAL MASTECTOMY WITH NEEDLE LOCALIZATION: SHX6008

## 2015-03-30 HISTORY — PX: SENTINEL NODE BIOPSY: SHX6608

## 2015-03-30 HISTORY — PX: BREAST LUMPECTOMY: SHX2

## 2015-03-30 SURGERY — PARTIAL MASTECTOMY WITH NEEDLE LOCALIZATION
Anesthesia: General | Laterality: Left | Wound class: Clean

## 2015-03-30 MED ORDER — ACETAMINOPHEN 10 MG/ML IV SOLN
INTRAVENOUS | Status: AC
  Filled 2015-03-30: qty 100

## 2015-03-30 MED ORDER — FENTANYL CITRATE (PF) 100 MCG/2ML IJ SOLN
25.0000 ug | INTRAMUSCULAR | Status: DC | PRN
  Administered 2015-03-30 (×4): 25 ug via INTRAVENOUS

## 2015-03-30 MED ORDER — ONDANSETRON HCL 4 MG/2ML IJ SOLN
INTRAMUSCULAR | Status: DC | PRN
  Administered 2015-03-30: 4 mg via INTRAVENOUS

## 2015-03-30 MED ORDER — ONDANSETRON HCL 4 MG/2ML IJ SOLN: 4.0000 mg | Freq: Once | INTRAMUSCULAR | Status: DC | PRN

## 2015-03-30 MED ORDER — FAMOTIDINE 20 MG PO TABS
20.0000 mg | ORAL_TABLET | Freq: Once | ORAL | Status: AC
  Administered 2015-03-30: 20 mg via ORAL

## 2015-03-30 MED ORDER — FENTANYL CITRATE (PF) 100 MCG/2ML IJ SOLN
INTRAMUSCULAR | Status: DC | PRN
  Administered 2015-03-30 (×4): 25 ug via INTRAVENOUS

## 2015-03-30 MED ORDER — LIDOCAINE HCL (CARDIAC) 20 MG/ML IV SOLN
INTRAVENOUS | Status: DC | PRN
  Administered 2015-03-30: 60 mg via INTRAVENOUS

## 2015-03-30 MED ORDER — TECHNETIUM TC 99M SULFUR COLLOID
1.0700 | Freq: Once | INTRAVENOUS | Status: AC | PRN
  Administered 2015-03-30: 1.07 via INTRAVENOUS

## 2015-03-30 MED ORDER — LACTATED RINGERS IV SOLN
INTRAVENOUS | Status: DC
  Administered 2015-03-30: 12:00:00 via INTRAVENOUS

## 2015-03-30 MED ORDER — FAMOTIDINE 20 MG PO TABS
ORAL_TABLET | ORAL | Status: AC
  Filled 2015-03-30: qty 1

## 2015-03-30 MED ORDER — OXYCODONE-ACETAMINOPHEN 5-325 MG PO TABS: 1.0000 | ORAL_TABLET | ORAL | Status: DC | PRN

## 2015-03-30 MED ORDER — BUPIVACAINE-EPINEPHRINE 0.5% -1:200000 IJ SOLN
INTRAMUSCULAR | Status: DC | PRN
  Administered 2015-03-30: 14 mL

## 2015-03-30 MED ORDER — BUPIVACAINE-EPINEPHRINE (PF) 0.5% -1:200000 IJ SOLN
INTRAMUSCULAR | Status: AC
  Filled 2015-03-30: qty 30

## 2015-03-30 MED ORDER — PROPOFOL 10 MG/ML IV BOLUS
INTRAVENOUS | Status: DC | PRN
  Administered 2015-03-30: 130 mg via INTRAVENOUS

## 2015-03-30 MED ORDER — ACETAMINOPHEN 10 MG/ML IV SOLN
INTRAVENOUS | Status: DC | PRN
  Administered 2015-03-30: 1000 mg via INTRAVENOUS

## 2015-03-30 MED ORDER — FENTANYL CITRATE (PF) 100 MCG/2ML IJ SOLN
INTRAMUSCULAR | Status: AC
  Filled 2015-03-30: qty 2

## 2015-03-30 MED ORDER — DEXAMETHASONE SODIUM PHOSPHATE 10 MG/ML IJ SOLN
INTRAMUSCULAR | Status: DC | PRN
  Administered 2015-03-30: 5 mg via INTRAVENOUS

## 2015-03-30 SURGICAL SUPPLY — 33 items
BLADE SURG 15 STRL LF DISP TIS (BLADE) ×1 IMPLANT
BLADE SURG 15 STRL SS (BLADE) ×2
CANISTER SUCT 1200ML W/VALVE (MISCELLANEOUS) ×3 IMPLANT
CHLORAPREP W/TINT 26ML (MISCELLANEOUS) ×3 IMPLANT
DEVICE DUBIN SPECIMEN MAMMOGRA (MISCELLANEOUS) ×3 IMPLANT
DRAPE LAPAROTOMY 77X122 PED (DRAPES) ×3 IMPLANT
ELECT REM PT RETURN 9FT ADLT (ELECTROSURGICAL) ×3
ELECTRODE REM PT RTRN 9FT ADLT (ELECTROSURGICAL) ×1 IMPLANT
GLOVE BIO SURGEON STRL SZ7.5 (GLOVE) ×12 IMPLANT
GLOVE BIOGEL PI IND STRL 7.5 (GLOVE) ×1 IMPLANT
GLOVE BIOGEL PI INDICATOR 7.5 (GLOVE) ×2
GLOVE EXAM NITRILE PF MED BLUE (GLOVE) ×6 IMPLANT
GOWN STRL REUS W/ TWL LRG LVL3 (GOWN DISPOSABLE) ×3 IMPLANT
GOWN STRL REUS W/ TWL XL LVL3 (GOWN DISPOSABLE) ×1 IMPLANT
GOWN STRL REUS W/TWL LRG LVL3 (GOWN DISPOSABLE) ×6
GOWN STRL REUS W/TWL XL LVL3 (GOWN DISPOSABLE) ×2
KIT RM TURNOVER STRD PROC AR (KITS) ×3 IMPLANT
LABEL OR SOLS (LABEL) ×3 IMPLANT
LIQUID BAND (GAUZE/BANDAGES/DRESSINGS) ×3 IMPLANT
MARGIN MAP 10MM (MISCELLANEOUS) ×3 IMPLANT
NDL SAFETY 18GX1.5 (NEEDLE) ×3 IMPLANT
NDL SAFETY 22GX1.5 (NEEDLE) ×3 IMPLANT
NEEDLE HYPO 25X1 1.5 SAFETY (NEEDLE) ×3 IMPLANT
PACK BASIN MINOR ARMC (MISCELLANEOUS) ×3 IMPLANT
SLEVE PROBE SENORX GAMMA FIND (MISCELLANEOUS) ×3 IMPLANT
SUT CHROMIC 4 0 RB 1X27 (SUTURE) ×6 IMPLANT
SUT ETHILON 3-0 FS-10 30 BLK (SUTURE) ×3
SUT MNCRL 4-0 (SUTURE) ×2
SUT MNCRL 4-0 27XMFL (SUTURE) ×1
SUTURE EHLN 3-0 FS-10 30 BLK (SUTURE) ×1 IMPLANT
SUTURE MNCRL 4-0 27XMF (SUTURE) ×1 IMPLANT
SYRINGE 10CC LL (SYRINGE) ×3 IMPLANT
WATER STERILE IRR 1000ML POUR (IV SOLUTION) ×3 IMPLANT

## 2015-03-30 NOTE — Anesthesia Postprocedure Evaluation (Signed)
Anesthesia Post Note  Patient: Traci [name]  Procedure(s) Performed: Procedure(s) (LRB): PARTIAL MASTECTOMY WITH NEEDLE LOCALIZATION (Left) SENTINEL NODE BIOPSY (Left)  Patient location during evaluation: PACU Anesthesia Type: General Level of consciousness: awake and alert Pain management: pain level controlled Vital Signs Assessment: post-procedure vital signs reviewed and stable Respiratory status: spontaneous breathing, nonlabored ventilation, respiratory function stable and patient connected to nasal cannula oxygen Cardiovascular status: blood pressure returned to baseline and stable Postop Assessment: no signs of nausea or vomiting Anesthetic complications: no    Last Vitals:  Filed Vitals:   03/30/15 1500 03/30/15 1520  BP:  145/82  Pulse:  63  Temp: 36.6 C 36.8 C  Resp:  16    Last Pain:  Filed Vitals:   03/30/15 1522  PainSc: 2                  Precious Haws Odean Fester

## 2015-03-30 NOTE — Anesthesia Procedure Notes (Signed)
Procedure Name: LMA Insertion Date/Time: 03/30/2015 12:07 PM Performed by: Johnna Acosta Pre-anesthesia Checklist: Patient identified, Emergency Drugs available, Suction available, Patient being monitored and Timeout performed Patient Re-evaluated:Patient Re-evaluated prior to inductionOxygen Delivery Method: Circle system utilized Preoxygenation: Pre-oxygenation with 100% oxygen Intubation Type: IV induction LMA: LMA inserted LMA Size: 3.5 Tube type: Oral Number of attempts: 1 Placement Confirmation: positive ETCO2 and breath sounds checked- equal and bilateral Tube secured with: Tape Dental Injury: Teeth and Oropharynx as per pre-operative assessment

## 2015-03-30 NOTE — Transfer of Care (Signed)
Immediate Anesthesia Transfer of Care Note  Patient: Traci [name]  Procedure(s) Performed: Procedure(s): PARTIAL MASTECTOMY WITH NEEDLE LOCALIZATION (Left) SENTINEL NODE BIOPSY (Left)  Patient Location: PACU  Anesthesia Type:General  Level of Consciousness: sedated  Airway & Oxygen Therapy: Patient Spontanous Breathing and Patient connected to nasal cannula oxygen  Post-op Assessment: Report given to RN and Post -op Vital signs reviewed and stable  Post vital signs: Reviewed and stable  Last Vitals:  Filed Vitals:   03/30/15 1130 03/30/15 1413  BP: 106/72 153/89  Pulse: 59 77  Temp: 36.5 C 36 C  Resp: 16 15    Complications: No apparent anesthesia complications

## 2015-03-30 NOTE — Op Note (Signed)
OPERATIVE REPORT  PREOPERATIVE  DIAGNOSIS: . Left breast cancer  POSTOPERATIVE DIAGNOSIS: . Left breast cancer  PROCEDURE: . Left partial mastectomy, axillary sentinel lymph node biopsy  ANESTHESIA:  General  SURGEON: Rochel Brome  MD   INDICATIONS: . She had mammogram findings of a stellate visit the in the medial aspect of the left breast. She had ultrasound-guided core needle biopsy demonstrated an infiltrated mammary carcinoma. She had preoperative insertion of Kopan's wire. She also had injection of radioactive technetium sulfur colloid.  She was placed on the operating table in the supine position under general anesthesia. The left arm was placed on a lateral arm support. The dressing was removed from the medial aspect of the left breast exposing the Kopan's wire which entered the breast at the 9:00 position. Mammogram images were reviewed prior to surgery. The breasts and axillae chest wall and upper arm were prepared with ChloraPrep and draped in a sterile manner  A curvilinear incision was made in the medial aspect of the left breast from the 8:00 to 10:00 position approximately 4 cm from the nipple and carried down through subcutaneous tissues using electrocautery for hemostasis.. A narrow ellipse of skin was removed with the underlying breast mass. The dissection was carried down to encounter the wire. A mass of breast tissue surrounding the wire was resected using electrocautery for hemostasis. The margin map markers were sutured to the specimen to mark the cranial caudal  medial lateral and deep margins  The specimen was submitted for specimen mammogram and pathology. Hemostasis was intact  The gamma counter was used to study the axilla. There was a focal area of radioactivity in the inferior aspect of the left axilla. An oblique incision was made in the inferior aspect of the axilla and carried down through subcutaneous tissues and through superficial fascia. 2 bleeding points were  suture ligated with 4-0 chromic. There was a lymph node identified which was removed with some surrounding fatty tissue. This lymph node was small in size. The ex vivo count was in the range of 80-110 counts per second. The background count was in the range of 3-12. There was no remaining palpable mass within the axilla. Several small bleeding points were cauterized. Hemostasis was subsequently intact.  The radiologist called to report that the biopsy marker was seen within the specimen. The pathologist called to report that the margins appeared to be satisfactory.  The breast wound was further inspected and several tiny bleeding points were cauterized. Tissues surrounding cautery artifact were infiltrated with half percent Sensorcaine with epinephrine subcutaneous tissues were also infiltrated. The subcutaneous tissues were approximated with interrupted 4-0 chromic. The skin was closed with running 4-0 Monocryl subcuticular suture.  The axillary wound was inspected and found hemostasis was intact. The subcutaneous tissues were infiltrated with half percent Sensorcaine with epinephrine. The subcutaneous tissues were approximated with 4-0 chromic. The skin was closed with running 4-0 Monocryl subcutaneous suture.  Both wounds were treated with LiquiBand and allowed to dry  The patient tolerated surgery satisfactorily and was prepared for transfer to the recovery room  [facility].D.

## 2015-03-30 NOTE — Anesthesia Preprocedure Evaluation (Signed)
Anesthesia Evaluation  Patient identified by MRN, date of birth, ID band Patient awake    Reviewed: Allergy & Precautions, H&P , NPO status , Patient's Chart, lab work & pertinent test results, reviewed documented beta blocker date and time   History of Anesthesia Complications Negative for: history of anesthetic complications  Airway Mallampati: III  TM Distance: >3 FB Neck ROM: full    Dental no notable dental hx. (+) Partial Lower, Partial Upper, Poor Dentition   Pulmonary neg pulmonary ROS,    Pulmonary exam normal breath sounds clear to auscultation       Cardiovascular Exercise Tolerance: Good negative cardio ROS Normal cardiovascular exam Rhythm:regular Rate:Normal     Neuro/Psych neg Seizures  Neuromuscular disease CVA negative psych ROS   GI/Hepatic Neg liver ROS, PUD, GERD  ,  Endo/Other  negative endocrine ROS  Renal/GU Renal disease  negative genitourinary   Musculoskeletal   Abdominal   Peds  Hematology negative hematology ROS (+)   Anesthesia Other Findings Past Medical History:   History of esophageal stricture                              GERD (gastroesophageal reflux disease)                       Inguinal lymphocyst                                          History of brachytherapy                                     History of shingles                                          History of colon polyps                                      Mild aphasia                                                 Hearing loss                                                 History of DVT (deep vein thrombosis)                        Stroke ([place])                                    04/06/2011      Comment:residual:  aphasia   Hiccups  Pain                                                           Comment:SEVERE CHRONIC RIGHT LEG,HIP,FOOT   Chronic kidney  disease                                         Comment:HYDRONEPHROSIS   Endometrial adenocarcinoma ([place])                01/2012        Comment: stage Ib, grade 1, positive peritoneal               cytology, no other risk factors or               extra-uterine disease   Cancer of left breast ([place])                     10/20/2014    Bone cancer ([place])                                            Arthritis                                                      Comment:Degenerative Disc Disease   Gastric ulcer                                                Deep vein blood clot of left lower extremity (* January 5*     Comment:Left Leg   Reproductive/Obstetrics negative OB ROS                             Anesthesia Physical  Anesthesia Plan  ASA: III  Anesthesia Plan: General   Post-op Pain Management:    Induction:   Airway Management Planned:   Additional Equipment:   Intra-op Plan:   Post-operative Plan:   Informed Consent: I have reviewed the patients History and Physical, chart, labs and discussed the procedure including the risks, benefits and alternatives for the proposed anesthesia with the patient or authorized representative who has indicated his/her understanding and acceptance.   Dental Advisory Given  Plan Discussed with: Anesthesiologist, CRNA and Surgeon  Anesthesia Plan Comments:         Anesthesia Quick Evaluation

## 2015-03-30 NOTE — Discharge Instructions (Addendum)
Take acetaminophen or oxycodone with acetaminophen if needed for pain.  May resume Plavix on Thursday.  May shower.  General Anesthesia, Adult General anesthesia is a sleep-like state of non-feeling produced by medicines (anesthetics). General anesthesia prevents you from being alert and feeling pain during a medical procedure. Your caregiver may recommend general anesthesia if your procedure:  Is long.  Is painful or uncomfortable.  Would be frightening to see or hear.  Requires you to be still.  Affects your breathing.  Causes significant blood loss. LET YOUR CAREGIVER KNOW ABOUT:  Allergies to food or medicine.  Medicines taken, including vitamins, herbs, eyedrops, over-the-counter medicines, and creams.  Use of steroids (by mouth or creams).  Previous problems with anesthetics or numbing medicines, including problems experienced by relatives.  History of bleeding problems or blood clots.  Previous surgeries and types of anesthetics received.  Possibility of pregnancy, if this applies.  Use of cigarettes, alcohol, or illegal drugs.  Any health condition(s), especially diabetes, sleep apnea, and high blood pressure. RISKS AND COMPLICATIONS General anesthesia rarely causes complications. However, if complications do occur, they can be life threatening. Complications include:  A lung infection.  A stroke.  A heart attack.  Waking up during the procedure. When this occurs, the patient may be unable to move and communicate that he or she is awake. The patient may feel severe pain. Older adults and adults with serious medical problems are more likely to have complications than adults who are young and healthy. Some complications can be prevented by answering all of your caregiver's questions thoroughly and by following all pre-procedure instructions. It is important to tell your caregiver if any of the pre-procedure instructions, especially those related to diet, were  not followed. Any food or liquid in the stomach can cause problems when you are under general anesthesia. BEFORE THE PROCEDURE  Ask your caregiver if you will have to spend the night at the hospital. If you will not have to spend the night, arrange to have an adult drive you and stay with you for 24 hours.  Follow your caregiver's instructions if you are taking dietary supplements or medicines. Your caregiver may tell you to stop taking them or to reduce your dosage.  Do not smoke for as long as possible before your procedure. If possible, stop smoking 3-6 weeks before the procedure.  Do not take new dietary supplements or medicines within 1 week of your procedure unless your caregiver approves them.  Do not eat within 8 hours of your procedure or as directed by your caregiver. Drink only clear liquids, such as water, black coffee (without milk or cream), and fruit juices (without pulp).  Do not drink within 3 hours of your procedure or as directed by your caregiver.  You may brush your teeth on the morning of the procedure, but make sure to spit out the toothpaste and water when finished. PROCEDURE  You will receive anesthetics through a mask, through an intravenous (IV) access tube, or through both. A doctor who specializes in anesthesia (anesthesiologist) or a nurse who specializes in anesthesia (nurse anesthetist) or both will stay with you throughout the procedure to make sure you remain unconscious. He or she will also watch your blood pressure, pulse, and oxygen levels to make sure that the anesthetics do not cause any problems. Once you are asleep, a breathing tube or mask may be used to help you breathe. AFTER THE PROCEDURE You will wake up after the procedure is complete. You may  be in the room where the procedure was performed or in a recovery area. You may have a sore throat if a breathing tube was used. You may also  feel:  Dizzy.  Weak.  Drowsy.  Confused.  Nauseous.  Cold. These are all normal responses and can be expected to last for up to 24 hours after the procedure is complete. A caregiver will tell you when you are ready to go home. This will usually be when you are fully awake and in stable condition.   This information is not intended to replace advice given to you by your health care provider. Make sure you discuss any questions you have with your health care provider.   Document Released: 05/02/2007 Document Revised: 02/13/2014 Document Reviewed: 05/24/2011 Elsevier Interactive Patient Education [employer].

## 2015-03-30 NOTE — H&P (Signed)
  She has had insertion of Kopan's wire in the medial aspect of the left breast and injection of radioactive technetium sulfur colloid.  She reports no change in condition since the day of the office visit.  I discussed the plan for left partial mastectomy and sentinel lymph node biopsy. The left side was marked YES

## 2015-03-31 ENCOUNTER — Inpatient Hospital Stay: Payer: Medicare Other

## 2015-03-31 ENCOUNTER — Encounter: Payer: Self-pay | Admitting: Surgery

## 2015-03-31 LAB — SURGICAL PATHOLOGY

## 2015-04-06 ENCOUNTER — Encounter: Payer: Self-pay | Admitting: Oncology

## 2015-04-06 ENCOUNTER — Inpatient Hospital Stay: Payer: Medicare Other

## 2015-04-06 ENCOUNTER — Inpatient Hospital Stay (HOSPITAL_BASED_OUTPATIENT_CLINIC_OR_DEPARTMENT_OTHER): Payer: Medicare Other | Admitting: Oncology

## 2015-04-06 VITALS — BP 132/71 | HR 71 | Temp 97.5°F | Resp 18 | Wt 122.6 lb

## 2015-04-06 DIAGNOSIS — Z8542 Personal history of malignant neoplasm of other parts of uterus: Secondary | ICD-10-CM

## 2015-04-06 DIAGNOSIS — C55 Malignant neoplasm of uterus, part unspecified: Secondary | ICD-10-CM

## 2015-04-06 DIAGNOSIS — Z86718 Personal history of other venous thrombosis and embolism: Secondary | ICD-10-CM

## 2015-04-06 DIAGNOSIS — C7951 Secondary malignant neoplasm of bone: Secondary | ICD-10-CM

## 2015-04-06 DIAGNOSIS — Z923 Personal history of irradiation: Secondary | ICD-10-CM

## 2015-04-06 DIAGNOSIS — C541 Malignant neoplasm of endometrium: Secondary | ICD-10-CM | POA: Diagnosis not present

## 2015-04-06 DIAGNOSIS — Z79899 Other long term (current) drug therapy: Secondary | ICD-10-CM

## 2015-04-06 DIAGNOSIS — Z7901 Long term (current) use of anticoagulants: Secondary | ICD-10-CM

## 2015-04-06 DIAGNOSIS — G40909 Epilepsy, unspecified, not intractable, without status epilepticus: Secondary | ICD-10-CM | POA: Insufficient documentation

## 2015-04-06 DIAGNOSIS — C7911 Secondary malignant neoplasm of bladder: Secondary | ICD-10-CM | POA: Diagnosis not present

## 2015-04-06 DIAGNOSIS — N1330 Unspecified hydronephrosis: Secondary | ICD-10-CM

## 2015-04-06 DIAGNOSIS — C50812 Malignant neoplasm of overlapping sites of left female breast: Secondary | ICD-10-CM

## 2015-04-06 DIAGNOSIS — M545 Low back pain: Secondary | ICD-10-CM

## 2015-04-06 DIAGNOSIS — Z170 Estrogen receptor positive status [ER+]: Secondary | ICD-10-CM

## 2015-04-06 LAB — CBC WITH DIFFERENTIAL/PLATELET
BASOS PCT: 1 %
Basophils Absolute: 0.1 10*3/uL (ref 0–0.1)
EOS ABS: 0.5 10*3/uL (ref 0–0.7)
EOS PCT: 9 %
HCT: 40 % (ref 35.0–47.0)
HEMOGLOBIN: 13.7 g/dL (ref 12.0–16.0)
LYMPHS ABS: 0.9 10*3/uL — AB (ref 1.0–3.6)
Lymphocytes Relative: 17 %
MCH: 32.4 pg (ref 26.0–34.0)
MCHC: 34.3 g/dL (ref 32.0–36.0)
MCV: 94.6 fL (ref 80.0–100.0)
MONO ABS: 0.6 10*3/uL (ref 0.2–0.9)
MONOS PCT: 11 %
NEUTROS PCT: 62 %
Neutro Abs: 3.4 10*3/uL (ref 1.4–6.5)
PLATELETS: 226 10*3/uL (ref 150–440)
RBC: 4.23 MIL/uL (ref 3.80–5.20)
RDW: 14.3 % (ref 11.5–14.5)
WBC: 5.5 10*3/uL (ref 3.6–11.0)

## 2015-04-06 LAB — COMPREHENSIVE METABOLIC PANEL
ALBUMIN: 3.9 g/dL (ref 3.5–5.0)
ALK PHOS: 44 U/L (ref 38–126)
ALT: 11 U/L — ABNORMAL LOW (ref 14–54)
ANION GAP: 5 (ref 5–15)
AST: 15 U/L (ref 15–41)
BUN: 28 mg/dL — ABNORMAL HIGH (ref 6–20)
CALCIUM: 8.2 mg/dL — AB (ref 8.9–10.3)
CO2: 22 mmol/L (ref 22–32)
Chloride: 107 mmol/L (ref 101–111)
Creatinine, Ser: 1.21 mg/dL — ABNORMAL HIGH (ref 0.44–1.00)
GFR calc non Af Amer: 40 mL/min — ABNORMAL LOW (ref >60)
GFR, EST AFRICAN AMERICAN: 47 mL/min — AB (ref >60)
GLUCOSE: 107 mg/dL — AB (ref 65–99)
POTASSIUM: 4.1 mmol/L (ref 3.5–5.1)
SODIUM: 134 mmol/L — AB (ref 135–145)
Total Bilirubin: 0.4 mg/dL (ref 0.3–1.2)
Total Protein: 6.8 g/dL (ref 6.5–8.1)

## 2015-04-06 MED ORDER — FENTANYL 50 MCG/HR TD PT72: 50.0000 ug | MEDICATED_PATCH | TRANSDERMAL | Status: DC

## 2015-04-06 MED ORDER — MEGESTROL ACETATE 40 MG PO TABS: 40.0000 mg | ORAL_TABLET | Freq: Four times a day (QID) | ORAL | Status: DC

## 2015-04-06 NOTE — Progress Notes (Signed)
[facility] @ [facility] [phone]  [phone]  Progress note  Traci [name] OB: 05-14-1929  MR#: [id number]  KGY#:[id number]  Patient Care Team: Idelle Crouch, MD as PCP - General (Unknown Physician Specialty) [name], MD as Referring Physician (Surgery) [name], MD as Consulting Physician (Urology)  CHIEF COMPLAINT:  Chief Complaint  Patient presents with  . Uterine Cancer  1/  VISIT DIAGNOSIS:     ICD-9-CM ICD-10-CM   1. Endometrial cancer (HCC) 182.0 C54.1 megestrol (MEGACE) 40 MG tablet     CT Abdomen Pelvis W Contrast     fentaNYL (DURAGESIC - DOSED MCG/HR) 50 MCG/HR    Oncology History   1.  Carcinoma of left breast  IMPRESSION: Ultrasound-guided biopsy of a suspicious left breast mass at 9 o'clock. No apparent complications.   2.  Estrogen receptor positive.  Progesterone receptor positive.  HER-2 receptor 2+ by  IHC . Fish is pending  5 mm tumor clinically stage IB N0 M0 tumor   3.  Patient has a previous history of endometrium  carcinoma of endometrium stage IB disease status post bilateral salpingo-oophorectomy and radiation therapy 4.  Right hydronephrosis detected on MRI scan off lumbosacral spine.  Cystoscopy with attempted stent placement revealed bladder tumor in September of 2016 biopsies positive for recurrent endometrial cancer  5.  Patient had  stent placement in the right kidney Started radiation therapy 6.  Patient has finished radiation therapy with relief in the pain (November, 2016) 7.  Started on Megace in December of 2016 8 deep vein thrombosis in the left lower extremity (February 08, 2014) Hematuria due to   xeralto IVC filter placement on February 10, 2014 She  is off xeralto  9.condition recently had a cystoscopy no evidence of bleeding was found so patient has been started on  ELOQUIS (March 15, 2015) 10.  Patient again had a significant bleeding with hematuria with eloquis and has been  discontinued. Patient is on Plavix.  And Megace. 11.  Patient had lumpectomy of the left breast.  0.2 cm tumor and negative lymph node ER/PR positive margins were clear  Oncology Flowsheet 04/08/2011 10/28/2014 02/10/2015 03/10/2015 03/15/2015 03/30/2015  denosumab (XGEVA) [place] - - - - 120 mg -  dexamethasone (DECADRON) IJ - - - - - -  dexamethasone (DECADRON) IV - - - - - -  enoxaparin (LOVENOX) [place] 40 mg - 1 mg/kg - - -  ondansetron (ZOFRAN) IV - - - - - -    INTERVAL HISTORY:  82 year old lady came today further follow-up patient has 5 mm infiltrating ductal carcinoma of breast during the workup patient was found to have recurrent endometrial cancer with extensive retroperitoneal adenopathy and pelvic wall invasion and bladder invasion started on palliative radiation therapy  Patient has significant improvement in pain.  Patient has been scheduled for cystoscopy and change in a stent.  There was some question about discontinuing Plavix.  Patient has been started on Plavix with a previous history of TIA which happened many years ago. Here for further follow-up and treatment consideration  PAIN IS UNDER  better control.       REVIEW OF SYSTEMS:   GENERAL:  Feels good.  Active.  No fevers, sweats or weight loss. PERFORMANCE STATUS (ECOG):  01 HEENT:  No visual changes, runny nose, sore throat, mouth sores or tenderness. Lungs: No shortness of breath or cough.  No hemoptysis. Cardiac:  No chest pain, palpitations, orthopnea, or PND. Right flank pain.  Right low back pain.  Right pain radiating down her right lower extremity.  Recent cystoscopy is consistent with bladder tumor.  Biopsies positive for endometrial cancer  Musculoskeletal:  No back pain.  No joint pain.  No muscle tenderness. Extremities:  No pain or swelling. Skin:  No rashes or skin changes. Neuro:  No headache, numbness or weakness, balance or coordination issues. Endocrine:  No diabetes, thyroid issues, hot flashes or night  sweats. Psych:  No mood changes, depression or anxiety. Left breast: Status post lumpectomy wound is healing well.   Review of systems:  All other systems reviewed and found to be negative.  As per HPI. Otherwise, a complete review of systems is negatve.  PAST MEDICAL HISTORY: Past Medical History  Diagnosis Date  . History of esophageal stricture   . GERD (gastroesophageal reflux disease)   . Inguinal lymphocyst   . History of brachytherapy   . History of shingles   . History of colon polyps   . Mild aphasia   . Hearing loss   . History of DVT (deep vein thrombosis)   . Stroke ([place]) 04/06/2011    residual:  aphasia  . Hiccups   . Pain     SEVERE CHRONIC RIGHT LEG,HIP,FOOT  . Chronic kidney disease     HYDRONEPHROSIS  . Arthritis     Degenerative Disc Disease  . Gastric ulcer   . Deep vein blood clot of left lower extremity ([place]) February 11, 2015    Left Leg  . Endometrial adenocarcinoma ([place]) 01/2012     stage Ib, grade 1, positive peritoneal cytology, no other risk factors or extra-uterine disease  . Cancer of left breast ([place]) 10/20/2014  . Bone cancer ([place])     PAST SURGICAL HISTORY: Past Surgical History  Procedure Laterality Date  . Esophageal dilation    . Thyroidectomy, partial    . Cataract extraction w/ intraocular lens implant      right  . Back surgery      fusion of L3&4  . Tonsillectomy    . Total abdominal hysterectomy w/ bilateral salpingoophorectomy Bilateral     staging biopsies  . Breast cyst aspiration N/A   . Breast biopsy Left 10/13/2014    path pending  . Cystoscopy w/ retrogrades Right 10/28/2014    Procedure: CYSTOSCOPY WITH RETROGRADE PYELOGRAM ATTEMPT, UNABLE TO PASS ;  Surgeon: [name], MD;  Location: [facility];  Service: Urology;  Laterality: Right;  . Cystoscopy with stent placement Right 10/28/2014    Procedure: BLADDER BIOPSY WITH FULGERATION ;  Surgeon: [name], MD;  Location: [facility];  Service: Urology;   Laterality: Right;  . Thyroid removed    . Eye surgery Bilateral     Cataract Extraction  . Portacath placement Right 11/26/2014    Procedure: INSERTION PORT-A-CATH;  Surgeon: [name], MD;  Location: [facility];  Service: General;  Laterality: Right;  . Peripheral vascular catheterization N/A 02/11/2015    Procedure: IVC Filter Insertion;  Surgeon: Algernon Huxley, MD;  Location: [facility];  Service: Cardiovascular;  Laterality: N/A;  . Abdominal hysterectomy  December 2013    history of cervical cancer  . Cystoscopy w/ ureteral stent placement Right 03/10/2015    Procedure: CYSTOSCOPY WITH STENT REPLACEMENT;  Surgeon: [name], MD;  Location: [facility];  Service: Urology;  Laterality: Right;  . Partial mastectomy with needle localization Left 03/30/2015    Procedure: PARTIAL MASTECTOMY WITH NEEDLE LOCALIZATION;  Surgeon: Delfina Redwood  Renda Rolls, MD;  Location: [facility];  Service: General;  Laterality: Left;  . Sentinel node biopsy Left 03/30/2015    Procedure: SENTINEL NODE BIOPSY;  Surgeon: Nadeen Landau, MD;  Location: [facility];  Service: General;  Laterality: Left;    FAMILY HISTORY Family History  Problem Relation Age of Onset  . Stroke Mother   . Breast cancer Maternal Aunt     x 2        ADVANCED DIRECTIVES:  Patient does not have any living will or healthcare power of attorney.  Information was given .  Available resources had been discussed.  We will follow-up on subsequent appointments regarding this issue  HEALTH MAINTENANCE: Social History  Substance Use Topics  . Smoking status: Never Smoker   . Smokeless tobacco: Never Used  . Alcohol Use: No      Allergies  Allergen Reactions  . Penicillins Rash  . Hydrocodone-Acetaminophen Nausea Only  . Novocain [Procaine] Other (See Comments) and Nausea And Vomiting    Chest pain  . Sulfa Antibiotics Other (See Comments)    CHEST PAIN    Current Outpatient Prescriptions  Medication Sig  Dispense Refill  . clopidogrel (PLAVIX) 75 MG tablet Take 75 mg by mouth daily.    . fentaNYL (DURAGESIC - DOSED MCG/HR) 50 MCG/HR Place 1 patch (50 mcg total) onto the skin every 3 (three) days. 10 patch 0  . gabapentin (NEURONTIN) 300 MG capsule Take 1 capsule (300 mg total) by mouth 3 (three) times daily. 90 capsule 2  . megestrol (MEGACE) 40 MG tablet Take 1 tablet (40 mg total) by mouth 4 (four) times daily. 120 tablet 3  . meloxicam (MOBIC) 15 MG tablet Take 1 tablet (15 mg total) by mouth daily. (Patient taking differently: Take 15 mg by mouth every morning. ) 30 tablet 2  . Multiple Vitamins-Minerals (MULTIVITAMIN WITH MINERALS) tablet Take 1 tablet by mouth daily.    Marland Kitchen oxyCODONE-acetaminophen (PERCOCET/ROXICET) 5-325 MG tablet Take 1-2 tablets by mouth every 4 (four) hours as needed for severe pain. 90 tablet 0   No current facility-administered medications for this visit.    OBJECTIVE: PHYSICAL EXAM: GENERAL:  Well developed, well nourished, sitting comfortably in the exam room in no acute distress. MENTAL STATUS:  Alert and oriented to person, place and time.  ENT:  Oropharynx clear without lesion.  Tongue normal. Mucous membranes moist.  RESPIRATORY:  Clear to auscultation without rales, wheezes or rhonchi. CARDIOVASCULAR:  Regular rate and rhythm without murmur, rub or gallop.  ABDOMEN:  Soft, non-tender, with active bowel sounds, and no hepatosplenomegaly.  No masses. BACK:   CVA tenderness.  No tenderness on percussion of the back or rib cage. SKIN:  No rashes, ulcers or lesions. EXTREMITIES: Left lower extremity edema LYMPH NODES: No palpable cervical, supraclavicular, axillary or inguinal adenopathy  NEUROLOGICAL: Unremarkable. PSYCH:  Appropriate.  Filed Vitals:   04/06/15 1530  BP: 132/71  Pulse: 71  Temp: 97.5 F (36.4 C)  Resp: 18     Body mass index is 21.03 kg/(m^2).    ECOG FS:1 - Symptomatic but completely ambulatory  LAB RESULTS:  CBC Latest Ref Rng  04/06/2015 03/19/2015 03/15/2015  WBC 3.6 - 11.0 K/uL 5.5 4.6 3.5(L)  Hemoglobin 12.0 - 16.0 g/dL 32.0 03.7 94.4  Hematocrit 35.0 - 47.0 % 40.0 37.8 40.1  Platelets 150 - 440 K/uL 226 209 211    STUDIES: Nm Sentinel Node Inj-no Rpt (breast)  03/30/2015  CLINICAL DATA: Breast Cancer (Left) Sulfur  colloid was injected intradermally by the nuclear medicine technologist for breast cancer sentinel node localization.   Mm Breast Surgical Specimen  03/30/2015  CLINICAL DATA:  Patient is post needle localization and surgical excision of biopsy prove malignancy over the inner left breast. EXAM: SPECIMEN RADIOGRAPH OF THE LEFT BREAST COMPARISON:  Previous exam(s). FINDINGS: Status post excision of the left breast. The wire tip and biopsy marker clip are present and are marked for pathology. Findings were called to the Dr. Tamala Julian in the OR at the time of dictation. IMPRESSION: Specimen radiograph of the left breast. Electronically Signed   By: Marin Olp M.D.   On: 03/30/2015 13:06   Mm Lt Plc Breast Loc Dev   1st Lesion  Inc Mammo Guide  03/30/2015  CLINICAL DATA:  Patient presents for needle localization prior to surgical excision of a biopsy-proven malignancy over the 9 o'clock position of the left breast. EXAM: NEEDLE LOCALIZATION OF THE left BREAST WITH MAMMO GUIDANCE COMPARISON:  Previous exams. FINDINGS: Patient presents for needle localization prior to surgical excision. I met with the patient and we discussed the procedure of needle localization including benefits and alternatives. We discussed the high likelihood of a successful procedure. We discussed the risks of the procedure, including infection, bleeding, tissue injury, and further surgery. Informed, written consent was given. The usual time-out protocol was performed immediately prior to the procedure. Using mammographic guidance, sterile technique, 1% lidocaine and a 7 cm modified Kopans needle, the targeted mass/ clip were localized using medial  to lateral approach. The wire passes immediately anterior to the clip as the clip/mass lies adjacent to the distal reinforced segment of the wire. The images were marked for Dr. Tamala Julian. IMPRESSION: Needle localization left breast. No apparent complications. Electronically Signed   By: Marin Olp M.D.   On: 03/30/2015 10:55    ASSESSMENT:  Carcinoma of left breast at 9:00 position clinically stage is T1b N0 M0 tumor estrogen receptor positive progesterone receptor positive HER-2/neu receptor pending by fish 2  PATIENT  had lumpectomy with margins clear. 3.  I will discuss with the radiation oncologists I doubt there would be any major benefit of radiation therapy to the breast in view of patient's stage IV and recurrent endometrial cancer.  But that will be discussed in tumor conference as well as with radiation Possibility of anti-hormone therapy would be considered patient presently on Megace oncologist.  Appointment has been made for evaluation by radiation oncologist. 4.  Continue Megace and repeat CT scan of abdomen and pelvis I discussed possibility if there is a progressive disease patient would need to start chemotherapy with carboplatinum and Taxol. 5.  She was encouraged to make frequent TRAVEL  as desired by her husband. 6.  Pain is under better control Total duration of visit was 35mnutes.  50% or more time was spent in counseling patient and family regarding prognosis and options of treatment and available resources

## 2015-04-06 NOTE — Progress Notes (Signed)
Patient requesting refills for Fentanyl 50 mcg and Megace.

## 2015-04-12 ENCOUNTER — Inpatient Hospital Stay: Payer: Medicare Other | Attending: Oncology

## 2015-04-12 ENCOUNTER — Ambulatory Visit: Payer: Medicare Other | Admitting: Oncology

## 2015-04-12 ENCOUNTER — Other Ambulatory Visit: Payer: Medicare Other

## 2015-04-12 DIAGNOSIS — C7951 Secondary malignant neoplasm of bone: Secondary | ICD-10-CM | POA: Insufficient documentation

## 2015-04-12 DIAGNOSIS — Z7902 Long term (current) use of antithrombotics/antiplatelets: Secondary | ICD-10-CM | POA: Insufficient documentation

## 2015-04-12 DIAGNOSIS — Z170 Estrogen receptor positive status [ER+]: Secondary | ICD-10-CM | POA: Insufficient documentation

## 2015-04-12 DIAGNOSIS — C541 Malignant neoplasm of endometrium: Secondary | ICD-10-CM | POA: Insufficient documentation

## 2015-04-12 DIAGNOSIS — Z86718 Personal history of other venous thrombosis and embolism: Secondary | ICD-10-CM | POA: Insufficient documentation

## 2015-04-12 DIAGNOSIS — Z90722 Acquired absence of ovaries, bilateral: Secondary | ICD-10-CM | POA: Insufficient documentation

## 2015-04-12 DIAGNOSIS — K219 Gastro-esophageal reflux disease without esophagitis: Secondary | ICD-10-CM | POA: Insufficient documentation

## 2015-04-12 DIAGNOSIS — Z86010 Personal history of colonic polyps: Secondary | ICD-10-CM | POA: Insufficient documentation

## 2015-04-12 DIAGNOSIS — Z923 Personal history of irradiation: Secondary | ICD-10-CM | POA: Insufficient documentation

## 2015-04-12 DIAGNOSIS — Z8542 Personal history of malignant neoplasm of other parts of uterus: Secondary | ICD-10-CM | POA: Insufficient documentation

## 2015-04-12 DIAGNOSIS — C50812 Malignant neoplasm of overlapping sites of left female breast: Secondary | ICD-10-CM | POA: Insufficient documentation

## 2015-04-12 DIAGNOSIS — Z79899 Other long term (current) drug therapy: Secondary | ICD-10-CM | POA: Insufficient documentation

## 2015-04-12 DIAGNOSIS — C7911 Secondary malignant neoplasm of bladder: Secondary | ICD-10-CM | POA: Insufficient documentation

## 2015-04-12 DIAGNOSIS — Z8673 Personal history of transient ischemic attack (TIA), and cerebral infarction without residual deficits: Secondary | ICD-10-CM | POA: Insufficient documentation

## 2015-04-15 ENCOUNTER — Other Ambulatory Visit: Payer: Self-pay | Admitting: *Deleted

## 2015-04-15 MED ORDER — OXYCODONE-ACETAMINOPHEN 5-325 MG PO TABS: 1.0000 | ORAL_TABLET | ORAL | Status: DC | PRN

## 2015-04-16 ENCOUNTER — Ambulatory Visit: Payer: Medicare Other | Admitting: Radiation Oncology

## 2015-04-16 ENCOUNTER — Ambulatory Visit: Admission: RE | Admit: 2015-04-16 | Payer: Medicare Other | Source: Ambulatory Visit | Admitting: Radiation Oncology

## 2015-04-19 ENCOUNTER — Other Ambulatory Visit: Payer: Self-pay | Admitting: *Deleted

## 2015-04-19 NOTE — Telephone Encounter (Signed)
Needs meds with refills onthem, she will call pharmacy

## 2015-04-22 ENCOUNTER — Ambulatory Visit: Payer: Medicare Other

## 2015-04-26 ENCOUNTER — Ambulatory Visit
Admission: RE | Admit: 2015-04-26 | Discharge: 2015-04-26 | Disposition: A | Discharge status: Home / Self Care | Payer: Medicare Other | Source: Ambulatory Visit | Attending: Oncology | Admitting: Oncology

## 2015-04-26 DIAGNOSIS — M899 Disorder of bone, unspecified: Secondary | ICD-10-CM | POA: Insufficient documentation

## 2015-04-26 DIAGNOSIS — C541 Malignant neoplasm of endometrium: Secondary | ICD-10-CM | POA: Insufficient documentation

## 2015-04-26 DIAGNOSIS — C786 Secondary malignant neoplasm of retroperitoneum and peritoneum: Secondary | ICD-10-CM | POA: Diagnosis not present

## 2015-04-26 MED ORDER — IOPAMIDOL (ISOVUE-300) INJECTION 61%
75.0000 mL | Freq: Once | INTRAVENOUS | Status: AC | PRN
  Administered 2015-04-26: 75 mL via INTRAVENOUS

## 2015-04-29 ENCOUNTER — Telehealth: Payer: Self-pay | Admitting: *Deleted

## 2015-04-29 DIAGNOSIS — C541 Malignant neoplasm of endometrium: Secondary | ICD-10-CM

## 2015-04-29 DIAGNOSIS — C50912 Malignant neoplasm of unspecified site of left female breast: Secondary | ICD-10-CM

## 2015-04-29 MED ORDER — GABAPENTIN 300 MG PO CAPS: 300.0000 mg | ORAL_CAPSULE | Freq: Three times a day (TID) | ORAL | Status: DC

## 2015-04-29 NOTE — Telephone Encounter (Signed)
Refill for gabapentin sent to [facility] [address].

## 2015-04-30 ENCOUNTER — Encounter: Payer: Self-pay | Admitting: Oncology

## 2015-04-30 ENCOUNTER — Inpatient Hospital Stay (HOSPITAL_BASED_OUTPATIENT_CLINIC_OR_DEPARTMENT_OTHER): Payer: Medicare Other | Admitting: Oncology

## 2015-04-30 ENCOUNTER — Inpatient Hospital Stay: Payer: Medicare Other

## 2015-04-30 VITALS — BP 168/71 | HR 61 | Temp 97.2°F | Resp 18 | Wt 122.5 lb

## 2015-04-30 DIAGNOSIS — K219 Gastro-esophageal reflux disease without esophagitis: Secondary | ICD-10-CM | POA: Diagnosis not present

## 2015-04-30 DIAGNOSIS — Z8673 Personal history of transient ischemic attack (TIA), and cerebral infarction without residual deficits: Secondary | ICD-10-CM

## 2015-04-30 DIAGNOSIS — Z79899 Other long term (current) drug therapy: Secondary | ICD-10-CM | POA: Diagnosis not present

## 2015-04-30 DIAGNOSIS — Z170 Estrogen receptor positive status [ER+]: Secondary | ICD-10-CM

## 2015-04-30 DIAGNOSIS — Z86718 Personal history of other venous thrombosis and embolism: Secondary | ICD-10-CM

## 2015-04-30 DIAGNOSIS — C541 Malignant neoplasm of endometrium: Secondary | ICD-10-CM

## 2015-04-30 DIAGNOSIS — Z7902 Long term (current) use of antithrombotics/antiplatelets: Secondary | ICD-10-CM

## 2015-04-30 DIAGNOSIS — Z8542 Personal history of malignant neoplasm of other parts of uterus: Secondary | ICD-10-CM | POA: Diagnosis not present

## 2015-04-30 DIAGNOSIS — Z90722 Acquired absence of ovaries, bilateral: Secondary | ICD-10-CM

## 2015-04-30 DIAGNOSIS — C50912 Malignant neoplasm of unspecified site of left female breast: Secondary | ICD-10-CM

## 2015-04-30 DIAGNOSIS — C50812 Malignant neoplasm of overlapping sites of left female breast: Secondary | ICD-10-CM | POA: Diagnosis not present

## 2015-04-30 DIAGNOSIS — C7951 Secondary malignant neoplasm of bone: Secondary | ICD-10-CM | POA: Diagnosis not present

## 2015-04-30 DIAGNOSIS — Z923 Personal history of irradiation: Secondary | ICD-10-CM

## 2015-04-30 DIAGNOSIS — C7911 Secondary malignant neoplasm of bladder: Secondary | ICD-10-CM | POA: Diagnosis not present

## 2015-04-30 DIAGNOSIS — Z86010 Personal history of colonic polyps: Secondary | ICD-10-CM | POA: Diagnosis not present

## 2015-04-30 LAB — CBC WITH DIFFERENTIAL/PLATELET
BASOS ABS: 0 10*3/uL (ref 0–0.1)
BASOS PCT: 1 %
EOS ABS: 0.2 10*3/uL (ref 0–0.7)
EOS PCT: 6 %
HCT: 41.6 % (ref 35.0–47.0)
Hemoglobin: 14.4 g/dL (ref 12.0–16.0)
Lymphocytes Relative: 22 %
Lymphs Abs: 0.8 10*3/uL — ABNORMAL LOW (ref 1.0–3.6)
MCH: 33 pg (ref 26.0–34.0)
MCHC: 34.6 g/dL (ref 32.0–36.0)
MCV: 95.5 fL (ref 80.0–100.0)
Monocytes Absolute: 0.4 10*3/uL (ref 0.2–0.9)
Monocytes Relative: 13 %
NEUTROS PCT: 58 %
Neutro Abs: 2.1 10*3/uL (ref 1.4–6.5)
PLATELETS: 234 10*3/uL (ref 150–440)
RBC: 4.36 MIL/uL (ref 3.80–5.20)
RDW: 13.5 % (ref 11.5–14.5)
WBC: 3.5 10*3/uL — AB (ref 3.6–11.0)

## 2015-04-30 LAB — COMPREHENSIVE METABOLIC PANEL
ALT: 15 U/L (ref 14–54)
AST: 19 U/L (ref 15–41)
Albumin: 4.1 g/dL (ref 3.5–5.0)
Alkaline Phosphatase: 40 U/L (ref 38–126)
Anion gap: 4 — ABNORMAL LOW (ref 5–15)
BUN: 23 mg/dL — ABNORMAL HIGH (ref 6–20)
CHLORIDE: 108 mmol/L (ref 101–111)
CO2: 24 mmol/L (ref 22–32)
CREATININE: 1.01 mg/dL — AB (ref 0.44–1.00)
Calcium: 8.8 mg/dL — ABNORMAL LOW (ref 8.9–10.3)
GFR, EST AFRICAN AMERICAN: 58 mL/min — AB (ref >60)
GFR, EST NON AFRICAN AMERICAN: 50 mL/min — AB (ref >60)
Glucose, Bld: 103 mg/dL — ABNORMAL HIGH (ref 65–99)
POTASSIUM: 4.6 mmol/L (ref 3.5–5.1)
Sodium: 136 mmol/L (ref 135–145)
Total Bilirubin: 0.7 mg/dL (ref 0.3–1.2)
Total Protein: 7.3 g/dL (ref 6.5–8.1)

## 2015-04-30 NOTE — Progress Notes (Signed)
Patient here today for CT results. 

## 2015-04-30 NOTE — Progress Notes (Signed)
[place] @ [facility] [phone]  Fax:([phone]) [phone]  Progress note  Traci [name] OB: 09-13-1929  MR#: [id number]  QZE#:[id number]  Patient Care Team: Idelle Crouch, MD as PCP - General (Unknown Physician Specialty) [name], MD as Referring Physician (Surgery) [name], MD as Consulting Physician (Urology)  CHIEF COMPLAINT:  Chief Complaint  Patient presents with  . Endometrial Cancer  1/  VISIT DIAGNOSIS:     ICD-9-CM ICD-10-CM   1. Malignant neoplasm of left female breast, unspecified site of breast (HCC) 174.9 C50.912 CBC with Differential     Comprehensive metabolic panel  2. Endometrial cancer ([place]) 182.0 C54.1 CBC with Differential     Comprehensive metabolic panel    Oncology History   1.  Carcinoma of left breast  IMPRESSION: Ultrasound-guided biopsy of a suspicious left breast mass at 9 o'clock. No apparent complications.   2.  Estrogen receptor positive.  Progesterone receptor positive.  HER-2 receptor 2+ by  IHC . Fish is pending  5 mm tumor clinically stage IB N0 M0 tumor   3.  Patient has a previous history of endometrium  carcinoma of endometrium stage IB disease status post bilateral salpingo-oophorectomy and radiation therapy 4.  Right hydronephrosis detected on MRI scan off lumbosacral spine.  Cystoscopy with attempted stent placement revealed bladder tumor in September of 2016 biopsies positive for recurrent endometrial cancer  5.  Patient had  stent placement in the right kidney Started radiation therapy 6.  Patient has finished radiation therapy with relief in the pain (November, 2016) 7.  Started on Megace in December of 2016 8 deep vein thrombosis in the left lower extremity (February 08, 2014) Hematuria due to   xeralto IVC filter placement on February 10, 2014 She  is off xeralto  9.condition recently had a cystoscopy no evidence of bleeding was found so patient has been started on  ELOQUIS (March 15, 2015) 10.  Patient again had a significant bleeding with hematuria with eloquis and has been discontinued. Patient is on Plavix.  And Megace. 11.  Patient had lumpectomy of the left breast.  0.2 cm tumor and negative lymph node ER/PR positive margins were clear  Oncology Flowsheet 04/08/2011 10/28/2014 02/10/2015 03/10/2015 03/15/2015 03/30/2015  denosumab (XGEVA) [place] - - - - 120 mg -  dexamethasone (DECADRON) IJ - - - - - -  dexamethasone (DECADRON) IV - - - - - -  enoxaparin (LOVENOX) [place] 40 mg - 1 mg/kg - - -  ondansetron (ZOFRAN) IV - - - - - -    INTERVAL HISTORY:  82 year old lady came today further follow-up patient has 5 mm infiltrating ductal carcinoma of breast during the workup patient was found to have recurrent endometrial cancer with extensive retroperitoneal adenopathy and pelvic wall invasion and bladder invasion started on palliative radiation therapy  Patient has significant improvement in pain.  Patient has been scheduled for cystoscopy and change in a stent.  There was some question about discontinuing Plavix.  Patient has been started on Plavix with a previous history of TIA which happened many years ago. Here for further follow-up and treatment consideration  PAIN IS UNDER  better control. Patient had a stent change.  This and is due for XGEVA.  Patient had a repeat CT scan which has been reviewed independently. Patient is here for ongoing evaluation and treatment consideration.        REVIEW OF SYSTEMS:   GENERAL:  Feels good.  Active.  No fevers, sweats  or weight loss. PERFORMANCE STATUS (ECOG):  01 HEENT:  No visual changes, runny nose, sore throat, mouth sores or tenderness. Lungs: No shortness of breath or cough.  No hemoptysis. Cardiac:  No chest pain, palpitations, orthopnea, or PND. Right flank pain.  Right low back pain.  Right pain radiating down her right lower extremity.  Recent cystoscopy is consistent with bladder tumor.  Biopsies positive for endometrial  cancer  Musculoskeletal:  No back pain.  No joint pain.  No muscle tenderness. Extremities:  No pain or swelling. Skin:  No rashes or skin changes. Neuro:  No headache, numbness or weakness, balance or coordination issues. Endocrine:  No diabetes, thyroid issues, hot flashes or night sweats. Psych:  No mood changes, depression or anxiety. Left breast: Status post lumpectomy wound is healing well.   Review of systems:  All other systems reviewed and found to be negative.  As per HPI. Otherwise, a complete review of systems is negatve.  PAST MEDICAL HISTORY: Past Medical History  Diagnosis Date  . History of esophageal stricture   . GERD (gastroesophageal reflux disease)   . Inguinal lymphocyst   . History of brachytherapy   . History of shingles   . History of colon polyps   . Mild aphasia   . Hearing loss   . History of DVT (deep vein thrombosis)   . Stroke ([place]) 04/06/2011    residual:  aphasia  . Hiccups   . Pain     SEVERE CHRONIC RIGHT LEG,HIP,FOOT  . Chronic kidney disease     HYDRONEPHROSIS  . Arthritis     Degenerative Disc Disease  . Gastric ulcer   . Deep vein blood clot of left lower extremity ([place]) February 11, 2015    Left Leg  . Endometrial adenocarcinoma ([place]) 01/2012     stage Ib, grade 1, positive peritoneal cytology, no other risk factors or extra-uterine disease  . Cancer of left breast ([place]) 10/20/2014  . Bone cancer ([place])     PAST SURGICAL HISTORY: Past Surgical History  Procedure Laterality Date  . Esophageal dilation    . Thyroidectomy, partial    . Cataract extraction w/ intraocular lens implant      right  . Back surgery      fusion of L3&4  . Tonsillectomy    . Total abdominal hysterectomy w/ bilateral salpingoophorectomy Bilateral     staging biopsies  . Breast cyst aspiration N/A   . Breast biopsy Left 10/13/2014    path pending  . Cystoscopy w/ retrogrades Right 10/28/2014    Procedure: CYSTOSCOPY WITH RETROGRADE PYELOGRAM ATTEMPT,  UNABLE TO PASS ;  Surgeon: [name], MD;  Location: [facility];  Service: Urology;  Laterality: Right;  . Cystoscopy with stent placement Right 10/28/2014    Procedure: BLADDER BIOPSY WITH FULGERATION ;  Surgeon: [name], MD;  Location: [facility] OR[facility];  Service: Urology;  Laterality: Right;  . Thyroid removed    . Eye surgery Bilateral     Cataract Extraction  . Portacath placement Right 11/26/2014    Procedure: INSERTION PORT-A-CATH;  Surgeon: [name], MD;  Location: [facility] ORS;  Service: General;  Laterality: Right;  . Peripheral vascular catheterization N/A 02/11/2015    Procedure: IVC Filter Insertion;  Surgeon: Algernon Huxley, MD;  Location: [facility];  Service: Cardiovascular;  Laterality: N/A;  . Abdominal hysterectomy  December 2013    history of cervical cancer  . Cystoscopy w/ ureteral stent placement Right 03/10/2015  Procedure: CYSTOSCOPY WITH STENT REPLACEMENT;  Surgeon: [name], MD;  Location: [facility];  Service: Urology;  Laterality: Right;  . Partial mastectomy with needle localization Left 03/30/2015    Procedure: PARTIAL MASTECTOMY WITH NEEDLE LOCALIZATION;  Surgeon: [name], MD;  Location: [facility];  Service: General;  Laterality: Left;  . Sentinel node biopsy Left 03/30/2015    Procedure: SENTINEL NODE BIOPSY;  Surgeon: [name], MD;  Location: [facility];  Service: General;  Laterality: Left;    FAMILY HISTORY Family History  Problem Relation Age of Onset  . Stroke Mother   . Breast cancer Maternal Aunt     x 2        ADVANCED DIRECTIVES:  Patient does not have any living will or healthcare power of attorney.  Information was given .  Available resources had been discussed.  We will follow-up on subsequent appointments regarding this issue  HEALTH MAINTENANCE: Social History  Substance Use Topics  . Smoking status: Never Smoker   . Smokeless tobacco: Never Used  . Alcohol Use: No      Allergies   Allergen Reactions  . Penicillins Rash  . Hydrocodone-Acetaminophen Nausea Only  . Novocain [Procaine] Other (See Comments) and Nausea And Vomiting    Chest pain  . Sulfa Antibiotics Other (See Comments)    CHEST PAIN    Current Outpatient Prescriptions  Medication Sig Dispense Refill  . clopidogrel (PLAVIX) 75 MG tablet Take 75 mg by mouth daily.    . fentaNYL (DURAGESIC - DOSED MCG/HR) 50 MCG/HR Place 1 patch (50 mcg total) onto the skin every 3 (three) days. 10 patch 0  . gabapentin (NEURONTIN) 300 MG capsule Take 1 capsule (300 mg total) by mouth 3 (three) times daily. 90 capsule 2  . megestrol (MEGACE) 40 MG tablet Take 1 tablet (40 mg total) by mouth 4 (four) times daily. 120 tablet 3  . meloxicam (MOBIC) 15 MG tablet Take 1 tablet (15 mg total) by mouth daily. (Patient taking differently: Take 15 mg by mouth every morning. ) 30 tablet 2  . Multiple Vitamins-Minerals (MULTIVITAMIN WITH MINERALS) tablet Take 1 tablet by mouth daily.    Marland Kitchen oxyCODONE-acetaminophen (PERCOCET/ROXICET) 5-325 MG tablet Take 1-2 tablets by mouth every 4 (four) hours as needed for severe pain. 90 tablet 0   No current facility-administered medications for this visit.    OBJECTIVE: PHYSICAL EXAM: GENERAL:  Well developed, well nourished, sitting comfortably in the exam room in no acute distress. MENTAL STATUS:  Alert and oriented to person, place and time.  ENT:  Oropharynx clear without lesion.  Tongue normal. Mucous membranes moist.  RESPIRATORY:  Clear to auscultation without rales, wheezes or rhonchi. CARDIOVASCULAR:  Regular rate and rhythm without murmur, rub or gallop.  ABDOMEN:  Soft, non-tender, with active bowel sounds, and no hepatosplenomegaly.  No masses. BACK:   CVA tenderness.  No tenderness on percussion of the back or rib cage. SKIN:  No rashes, ulcers or lesions. EXTREMITIES: Left lower extremity edema LYMPH NODES: No palpable cervical, supraclavicular, axillary or inguinal adenopathy   NEUROLOGICAL: Unremarkable. PSYCH:  Appropriate.  Filed Vitals:   04/30/15 0958  BP: 168/71  Pulse: 61  Temp: 97.2 F (36.2 C)  Resp: 18     Body mass index is 21.02 kg/(m^2).    ECOG FS:1 - Symptomatic but completely ambulatory  LAB RESULTS:  CBC Latest Ref Rng 04/06/2015 03/19/2015 03/15/2015  WBC 3.6 - 11.0 K/uL 5.5 4.6 3.5(L)  Hemoglobin 12.0 -  16.0 g/dL 13.7 13.0 13.6  Hematocrit 35.0 - 47.0 % 40.0 37.8 40.1  Platelets 150 - 440 K/uL 226 209 211    STUDIES: Ct Abdomen Pelvis W Contrast  04/26/2015  CLINICAL DATA:  Patient has a previous history of endometrium carcinoma of endometrium stage IB disease status post bilateral salpingo-oophorectomy,hysterectomy and radiation therapy. Subsequent treatment evaluation EXAM: CT ABDOMEN AND PELVIS WITH CONTRAST TECHNIQUE: Multidetector CT imaging of the abdomen and pelvis was performed using the standard protocol following bolus administration of intravenous contrast. CONTRAST:  29m ISOVUE-300 IOPAMIDOL (ISOVUE-300) INJECTION 61% COMPARISON:  CT 02/04/2015 FINDINGS: Lower chest: Lung bases are clear. Hepatobiliary: No focal hepatic lesion. No biliary duct dilatation. Gallbladder is normal. Common bile duct is normal. Pancreas: Pancreas is normal. No ductal dilatation. No pancreatic inflammation. Spleen: Normal spleen Adrenals/urinary tract: Adrenal glands normal. Similar dilatation of the RIGHT renal pelvis. Double-J ureteral stent extends from the RIGHT renal pelvis to the bladder. There is atrophy of the RIGHT kidney compared to the LEFT. LEFT kidney appears normal. No abnormality of the bladder identified. Stomach/Bowel: Stomach, small bowel cecum normal. The colon demonstrates several diverticula of the sigmoid region. Rectum normal. Vascular/Lymphatic: Abdominal is normal caliber. No retroperitoneal lymphadenopathy. Infrarenal IVC filter noted. Mesenteric adenopathy. No pelvic adenopathy. Reproductive: Post hysterectomy and oophorectomy. Side  wall thickening along the LEFT obturator space measures 22x 8 mm compared to 28 x 11 mm and is less conspicuous. Nodular thickening along the LEFT aspect of the vaginal cuff is also less conspicuous measure 11 mm compared to 14 mm (image 67, series 2). Low pelvic peritoneal nodule measuring 5 mm image 58 compares to 13 mm on prior. Other: No evidence of new peritoneal disease. Lesion along the descending colon is less conspicuous measuring 7 mm image 54, series 2 compared to 11 mm. Musculoskeletal: Expansile lytic lesion in the RIGHT iliac bone is decreased in size measuring 4.4 x 3.4 cm decreased from 6.4 x 4.2 cm remeasured. No new skeletal lesions. IMPRESSION: 1. Decrease in size of expansile lesion the RIGHT iliac bone. 2. Decreased in prominence of LEFT pelvic sidewall lesion and peritoneal metastasis. 3. No evidence of disease progression. 4. No significant change in  RIGHT ureteral stent. Electronically Signed   By: SSuzy BouchardM.D.   On: 04/26/2015 10:50    ASSESSMENT:  Carcinoma of left breast at 9:00 position clinically stage is T1b N0 M0 tumor estrogen receptor positive progesterone receptor positive HER-2/neu receptor pending by fish 2  PATIENT  had lumpectomy with margins clear. Patient is here for ongoing evaluation and treatment consideration regarding endometrial cancer and metastases to the bone ct scan has been reviewed independently.. There is significant decrease in the size of the metastases. Will continue Megace if needed can be changed over to letrozole.  Combination of letrozole and Afinitor has been proven useful in the past to control endometrial cancer  Continue XGEVA   Reviewed  CT scan with the family ..Marland KitchenReevaluate patient in 4-6 weeks.   Patient and family at number of questions which were answered

## 2015-05-03 ENCOUNTER — Ambulatory Visit
Admission: RE | Admit: 2015-05-03 | Discharge: 2015-05-03 | Disposition: A | Discharge status: Home / Self Care | Payer: Medicare Other | Source: Ambulatory Visit | Attending: Radiation Oncology | Admitting: Radiation Oncology

## 2015-05-03 ENCOUNTER — Encounter: Payer: Self-pay | Admitting: Radiation Oncology

## 2015-05-03 VITALS — BP 133/60 | HR 60 | Temp 98.6°F | Resp 20 | Wt 124.3 lb

## 2015-05-03 DIAGNOSIS — C50912 Malignant neoplasm of unspecified site of left female breast: Secondary | ICD-10-CM

## 2015-05-03 NOTE — Progress Notes (Signed)
Radiation Oncology Follow up Note  Name: Traci [name]   Date:   05/03/2015 MRN:  [id number] DOB: 17-Jun-1929    Old patient new area ([facility]) for evaluation of left breast invasive mammary carcinoma This 82 y.o. female presents to the clinic today for evaluation of stage I invasive mammary carcinoma of the left breast in patient with known stage IV endometrial cancer.  REFERRING PROVIDER: Forest Gleason, MD  HPI: Patient is a 82 year old female well-known to department having recently completed palliative radiation therapy to her pelvis for stage IV endometrial cancer. She has done well on recent palliative treatment for large mass invading the right iliac wing. Her pain has subsided she had a greater than 50% response by CT criteria. Recently was presented with in suspicious mass at the 9:00 position 4 cm from the nipple back in September 2016. She recently underwent a wide local excision of the left breast for strongly ER/PR positive HER-2/neu negative invasive mammary carcinoma lesion was a T1 be N0 lesion no lymph nodes were involved margins were clear at 0.5 cm. She seen today for recommendation of adjuvant radiation therapy. Again her pain in her pelvis is markedly improved she's ambulating well. She specifically denies breast tenderness cough or bone pain.  COMPLICATIONS OF TREATMENT: none  FOLLOW UP COMPLIANCE: keeps appointments   PHYSICAL EXAM:  BP 133/60 mmHg  Pulse 60  Temp(Src) 98.6 F (37 C)  Resp 20  Wt 124 lb 5.4 oz (56.4 kg) She has a Port-A-Cath present in the right anterior chest wall. She status post recent left breast surgery with Enseal incision is healing well. No dominant mass or nodularity is noted in either breast in 2 positions examined. No axillary or supraclavicular adenopathy is identified. Well-developed well-nourished patient in NAD. HEENT reveals PERLA, EOMI, discs not visualized.  Oral cavity is clear. No oral mucosal lesions are identified. Neck is clear  without evidence of cervical or supraclavicular adenopathy. Lungs are clear to A&P. Cardiac examination is essentially unremarkable with regular rate and rhythm without murmur rub or thrill. Abdomen is benign with no organomegaly or masses noted. Motor sensory and DTR levels are equal and symmetric in the upper and lower extremities. Cranial nerves II through XII are grossly intact. Proprioception is intact. No peripheral adenopathy or edema is identified. No motor or sensory levels are noted. Crude visual fields are within normal range.  RADIOLOGY RESULTS: Recent CT scans her abdomen pelvis as well as her mammograms and ultrasounds are reviewed  PLAN: At this time based on her stage IV initial cancer diagnosis do not feel adjuvant radiation therapy and a small ER/PR positive tumor status post wide local excision is necessary. I would continue her on Megace as she is currently been prescribed with may be a shift letrozole according to medical oncology. At this time I'm turning follow-up care over to medical oncology. Would be happy to reevaluate the patient any time should further palliative treatment be indicated. Patient and her husband are perfectly comfortable with my treatment recommendations.  I would like to take this opportunity for allowing me to participate in the care of your patient.Armstead Peaks., MD

## 2015-05-04 ENCOUNTER — Inpatient Hospital Stay: Payer: Medicare Other

## 2015-05-06 ENCOUNTER — Other Ambulatory Visit: Payer: Self-pay | Admitting: *Deleted

## 2015-05-06 DIAGNOSIS — C541 Malignant neoplasm of endometrium: Secondary | ICD-10-CM

## 2015-05-06 MED ORDER — MEGESTROL ACETATE 40 MG PO TABS: 40.0000 mg | ORAL_TABLET | Freq: Four times a day (QID) | ORAL | Status: DC

## 2015-05-06 MED ORDER — OXYCODONE-ACETAMINOPHEN 5-325 MG PO TABS: 1.0000 | ORAL_TABLET | ORAL | Status: DC | PRN

## 2015-05-13 ENCOUNTER — Other Ambulatory Visit: Payer: Self-pay | Admitting: *Deleted

## 2015-05-13 DIAGNOSIS — C541 Malignant neoplasm of endometrium: Secondary | ICD-10-CM

## 2015-05-13 MED ORDER — FENTANYL 50 MCG/HR TD PT72: 50.0000 ug | MEDICATED_PATCH | TRANSDERMAL | Status: DC

## 2015-05-17 ENCOUNTER — Telehealth: Payer: Self-pay | Admitting: *Deleted

## 2015-05-17 MED ORDER — POLYETHYLENE GLYCOL 3350 17 GM/SCOOP PO POWD: 17.0000 g | Freq: Every day | ORAL | Status: DC

## 2015-05-17 MED ORDER — DOCUSATE SODIUM 100 MG PO CAPS: 100.0000 mg | ORAL_CAPSULE | Freq: Two times a day (BID) | ORAL | Status: DC

## 2015-05-17 NOTE — Telephone Encounter (Signed)
Keep using stool softeners and add Miralax daily to have BM every 1 -2 days. E scribed rx, attempted to call pt, had to leave VM

## 2015-05-17 NOTE — Telephone Encounter (Signed)
Called to report that she only has BM every 2 - 3 days and she is passing blood with her BM today again.

## 2015-05-24 NOTE — Telephone Encounter (Signed)
LMOM to confirm below

## 2015-05-31 ENCOUNTER — Telehealth: Payer: Self-pay | Admitting: Radiology

## 2015-05-31 ENCOUNTER — Other Ambulatory Visit: Payer: Self-pay | Admitting: *Deleted

## 2015-05-31 ENCOUNTER — Other Ambulatory Visit: Payer: Medicare Other

## 2015-05-31 DIAGNOSIS — Z87718 Personal history of other specified (corrected) congenital malformations of genitourinary system: Secondary | ICD-10-CM

## 2015-05-31 DIAGNOSIS — N39490 Overflow incontinence: Secondary | ICD-10-CM

## 2015-05-31 DIAGNOSIS — Z87448 Personal history of other diseases of urinary system: Secondary | ICD-10-CM

## 2015-05-31 MED ORDER — OXYCODONE-ACETAMINOPHEN 5-325 MG PO TABS: 1.0000 | ORAL_TABLET | ORAL | Status: DC | PRN

## 2015-05-31 NOTE — Telephone Encounter (Signed)
-----   Message from [name], MD sent at 03/10/2015  4:21 PM EST ----- Traci [name] will need to be scheduled for repeat right ureteral stent exchange in 3 months. She can be scheduled. She does not need to see me in interim. Thanks.

## 2015-05-31 NOTE — Telephone Encounter (Signed)
Pt came to clinic for urine culture. Reminded pt of surgery scheduled 06/09/15, pre-admit testing phone interview on 4/26 between 9am-1pm and to call day prior to surgery for arrival time to SDS. Pt voices understanding.

## 2015-06-01 ENCOUNTER — Inpatient Hospital Stay: Payer: Medicare Other | Attending: Oncology

## 2015-06-01 ENCOUNTER — Inpatient Hospital Stay (HOSPITAL_BASED_OUTPATIENT_CLINIC_OR_DEPARTMENT_OTHER): Payer: Medicare Other | Admitting: Oncology

## 2015-06-01 ENCOUNTER — Inpatient Hospital Stay: Payer: Medicare Other

## 2015-06-01 VITALS — BP 146/75 | HR 66 | Temp 97.3°F | Resp 18 | Wt 123.7 lb

## 2015-06-01 DIAGNOSIS — N189 Chronic kidney disease, unspecified: Secondary | ICD-10-CM

## 2015-06-01 DIAGNOSIS — Z8673 Personal history of transient ischemic attack (TIA), and cerebral infarction without residual deficits: Secondary | ICD-10-CM | POA: Diagnosis not present

## 2015-06-01 DIAGNOSIS — Z79899 Other long term (current) drug therapy: Secondary | ICD-10-CM | POA: Insufficient documentation

## 2015-06-01 DIAGNOSIS — C7951 Secondary malignant neoplasm of bone: Secondary | ICD-10-CM | POA: Insufficient documentation

## 2015-06-01 DIAGNOSIS — C50812 Malignant neoplasm of overlapping sites of left female breast: Secondary | ICD-10-CM

## 2015-06-01 DIAGNOSIS — Z90722 Acquired absence of ovaries, bilateral: Secondary | ICD-10-CM | POA: Diagnosis not present

## 2015-06-01 DIAGNOSIS — Z86711 Personal history of pulmonary embolism: Secondary | ICD-10-CM | POA: Diagnosis not present

## 2015-06-01 DIAGNOSIS — C7911 Secondary malignant neoplasm of bladder: Secondary | ICD-10-CM | POA: Diagnosis not present

## 2015-06-01 DIAGNOSIS — M545 Low back pain: Secondary | ICD-10-CM

## 2015-06-01 DIAGNOSIS — Z86718 Personal history of other venous thrombosis and embolism: Secondary | ICD-10-CM | POA: Insufficient documentation

## 2015-06-01 DIAGNOSIS — R109 Unspecified abdominal pain: Secondary | ICD-10-CM | POA: Insufficient documentation

## 2015-06-01 DIAGNOSIS — Z170 Estrogen receptor positive status [ER+]: Secondary | ICD-10-CM

## 2015-06-01 DIAGNOSIS — K219 Gastro-esophageal reflux disease without esophagitis: Secondary | ICD-10-CM | POA: Diagnosis not present

## 2015-06-01 DIAGNOSIS — Z7902 Long term (current) use of antithrombotics/antiplatelets: Secondary | ICD-10-CM | POA: Diagnosis not present

## 2015-06-01 DIAGNOSIS — Z923 Personal history of irradiation: Secondary | ICD-10-CM

## 2015-06-01 DIAGNOSIS — C541 Malignant neoplasm of endometrium: Secondary | ICD-10-CM

## 2015-06-01 DIAGNOSIS — Z86010 Personal history of colonic polyps: Secondary | ICD-10-CM | POA: Insufficient documentation

## 2015-06-01 DIAGNOSIS — M1990 Unspecified osteoarthritis, unspecified site: Secondary | ICD-10-CM | POA: Diagnosis not present

## 2015-06-01 DIAGNOSIS — C50912 Malignant neoplasm of unspecified site of left female breast: Secondary | ICD-10-CM

## 2015-06-01 LAB — CBC WITH DIFFERENTIAL/PLATELET
BASOS ABS: 0 10*3/uL (ref 0–0.1)
BASOS PCT: 1 %
EOS ABS: 0.3 10*3/uL (ref 0–0.7)
EOS PCT: 7 %
HCT: 40.8 % (ref 35.0–47.0)
HEMOGLOBIN: 14.1 g/dL (ref 12.0–16.0)
LYMPHS ABS: 0.9 10*3/uL — AB (ref 1.0–3.6)
Lymphocytes Relative: 26 %
MCH: 32.6 pg (ref 26.0–34.0)
MCHC: 34.5 g/dL (ref 32.0–36.0)
MCV: 94.6 fL (ref 80.0–100.0)
Monocytes Absolute: 0.4 10*3/uL (ref 0.2–0.9)
Monocytes Relative: 12 %
NEUTROS PCT: 54 %
Neutro Abs: 1.9 10*3/uL (ref 1.4–6.5)
PLATELETS: 246 10*3/uL (ref 150–440)
RBC: 4.31 MIL/uL (ref 3.80–5.20)
RDW: 13.5 % (ref 11.5–14.5)
WBC: 3.6 10*3/uL (ref 3.6–11.0)

## 2015-06-01 LAB — COMPREHENSIVE METABOLIC PANEL
ALBUMIN: 4 g/dL (ref 3.5–5.0)
ALK PHOS: 36 U/L — AB (ref 38–126)
ALT: 15 U/L (ref 14–54)
AST: 20 U/L (ref 15–41)
Anion gap: 6 (ref 5–15)
BUN: 26 mg/dL — AB (ref 6–20)
CALCIUM: 8.6 mg/dL — AB (ref 8.9–10.3)
CHLORIDE: 112 mmol/L — AB (ref 101–111)
CO2: 25 mmol/L (ref 22–32)
Creatinine, Ser: 1.2 mg/dL — ABNORMAL HIGH (ref 0.44–1.00)
GFR calc Af Amer: 47 mL/min — ABNORMAL LOW (ref >60)
GFR calc non Af Amer: 41 mL/min — ABNORMAL LOW (ref >60)
GLUCOSE: 109 mg/dL — AB (ref 65–99)
Potassium: 4.1 mmol/L (ref 3.5–5.1)
SODIUM: 143 mmol/L (ref 135–145)
Total Bilirubin: 0.8 mg/dL (ref 0.3–1.2)
Total Protein: 6.9 g/dL (ref 6.5–8.1)

## 2015-06-01 LAB — URINALYSIS, COMPLETE
BILIRUBIN UA: NEGATIVE
GLUCOSE, UA: NEGATIVE
NITRITE UA: NEGATIVE
Specific Gravity, UA: 1.025 (ref 1.005–1.030)
UUROB: 0.2 mg/dL (ref 0.2–1.0)
pH, UA: 5.5 (ref 5.0–7.5)

## 2015-06-01 LAB — MICROSCOPIC EXAMINATION: Bacteria, UA: NONE SEEN

## 2015-06-01 MED ORDER — DENOSUMAB 120 MG/1.7ML ~~LOC~~ SOLN
120.0000 mg | Freq: Once | SUBCUTANEOUS | Status: AC
  Administered 2015-06-01: 120 mg via SUBCUTANEOUS
  Filled 2015-06-01: qty 1.7

## 2015-06-01 NOTE — Progress Notes (Signed)
Patient states she is coughing a lot.  Also gets strangled easily.

## 2015-06-02 ENCOUNTER — Encounter: Payer: Self-pay | Admitting: *Deleted

## 2015-06-02 ENCOUNTER — Telehealth: Payer: Self-pay | Admitting: Radiology

## 2015-06-02 ENCOUNTER — Inpatient Hospital Stay: Admission: RE | Admit: 2015-06-02 | Payer: Medicare Other | Source: Ambulatory Visit

## 2015-06-02 LAB — CULTURE, URINE COMPREHENSIVE

## 2015-06-02 NOTE — Pre-Procedure Instructions (Signed)
[facility] IN OR NOTIFIED PORT A CATH ON RIGHT

## 2015-06-02 NOTE — Patient Instructions (Signed)
  Your procedure is scheduled on: 06/09/15 Report to Day Surgery. [facility] SECOND FLOOR To find out your arrival time please call [phone] between 1PM - 3PM on 06/08/15.  Remember: Instructions that are not followed completely may result in serious medical risk, up to and including death, or upon the discretion of your surgeon and anesthesiologist your surgery may need to be rescheduled.    _X___ 1. Do not eat food or drink liquids after midnight. No gum chewing or hard candies.     _X___ 2. No Alcohol for 24 hours before or after surgery.   ____ 3. Bring all medications with you on the day of surgery if instructed.    __X__ 4. Notify your doctor if there is any change in your medical condition     (cold, fever, infections).     Do not wear jewelry, make-up, hairpins, clips or nail polish.  Do not wear lotions, powders, or perfumes. You may wear deodorant.  Do not shave 48 hours prior to surgery. Men may shave face and neck.  Do not bring valuables to the hospital.    [facility] is not responsible for any belongings or valuables.               Contacts, dentures or bridgework may not be worn into surgery.  Leave your suitcase in the car. After surgery it may be brought to your room.  For patients admitted to the hospital, discharge time is determined by your                treatment team.   Patients discharged the day of surgery will not be allowed to drive home.   Please read over the following fact sheets that you were given:   Surgical Site Infection Prevention   ____ Take these medicines the morning of surgery with A SIP OF WATER:    1. GABAPENTIN  2. DURAGESIC AS USUAL  3.   4.  5.  6.  ____ Fleet Enema (as directed)   ____ Use CHG Soap as directed  ____ Use inhalers on the day of surgery  ____ Stop metformin 2 days prior to surgery    ____ Take 1/2 of usual insulin dose the night before surgery and none on the morning of surgery.   _X___ Stop  Coumadin/Plavix/aspirin on   STOP ASPIRIN 06/02/15 _X___ Stop Anti-inflammatories on    STOP MELOXICAM 06/02/15   ____ Stop supplements until after surgery.    ____ Bring C-Pap to the hospital.

## 2015-06-02 NOTE — Telephone Encounter (Signed)
LMOM. Pt needs to RTC for repeat urine culture from cathed specimen.

## 2015-06-03 ENCOUNTER — Ambulatory Visit (INDEPENDENT_AMBULATORY_CARE_PROVIDER_SITE_OTHER): Payer: Medicare Other

## 2015-06-03 DIAGNOSIS — Z419 Encounter for procedure for purposes other than remedying health state, unspecified: Secondary | ICD-10-CM

## 2015-06-03 NOTE — Progress Notes (Signed)
In and Out Catheterization  Patient is present today for a I & O catheterization due to surgery. Patient was cleaned and prepped in a sterile fashion with betadine and Lidocaine 2% jelly was instilled into the urethra.  A 14FR cath was inserted no complications were noted , 34ml of urine return was noted, urine was amber in color. A clean urine sample was collected for cx. Bladder was drained  And catheter was removed with out difficulty.    Preformed by: Toniann Fail, LPN

## 2015-06-05 LAB — CULTURE, URINE COMPREHENSIVE

## 2015-06-07 ENCOUNTER — Encounter: Payer: Self-pay | Admitting: Oncology

## 2015-06-07 NOTE — Progress Notes (Signed)
[place] @ [facility] [phone]  Fax:([phone]  Progress note  Traci [name] OB: 1930-02-05  MR#: [id number]  FXT#:[id number]  Patient Care Team: Idelle Crouch, MD as PCP - General (Unknown Physician Specialty) [name], MD as Referring Physician (Surgery) [name], MD as Consulting Physician (Urology)  CHIEF COMPLAINT:  Chief Complaint  Patient presents with  . Breast Cancer  1/  VISIT DIAGNOSIS:     ICD-9-CM ICD-10-CM   1. Malignant neoplasm of left female breast, unspecified site of breast ([place]) 174.9 C50.912 MM Digital Diagnostic Bilat     US BREAST LTD UNI RIGHT INC AXILLA     US BREAST LTD UNI LEFT INC AXILLA     CBC with Differential     Comprehensive metabolic panel  2. Endometrial cancer ([place]) 182.0 C54.1 MM Digital Diagnostic Bilat     US BREAST LTD UNI RIGHT INC AXILLA     US BREAST LTD UNI LEFT INC AXILLA     CBC with Differential     Comprehensive metabolic panel    Oncology History   1.  Carcinoma of left breast  IMPRESSION: Ultrasound-guided biopsy of a suspicious left breast mass at 9 o'clock. No apparent complications.   2.  Estrogen receptor positive.  Progesterone receptor positive.  HER-2 receptor 2+ by  IHC . Fish is pending  5 mm tumor clinically stage IB N0 M0 tumor   3.  Patient has a previous history of endometrium  carcinoma of endometrium stage IB disease status post bilateral salpingo-oophorectomy and radiation therapy 4.  Right hydronephrosis detected on MRI scan off lumbosacral spine.  Cystoscopy with attempted stent placement revealed bladder tumor in September of 2016 biopsies positive for recurrent endometrial cancer  5.  Patient had  stent placement in the right kidney Started radiation therapy 6.  Patient has finished radiation therapy with relief in the pain (November, 2016) 7.  Started on Megace in December of 2016 8 deep vein thrombosis in the left lower extremity (February 08, 2014) Hematuria due to   xeralto IVC filter placement on February 10, 2014 She  is off xeralto  9.condition recently had a cystoscopy no evidence of bleeding was found so patient has been started on  ELOQUIS (March 15, 2015) 10.  Patient again had a significant bleeding with hematuria with eloquis and has been discontinued. Patient is on Plavix.  And Megace. 11.  Patient had lumpectomy of the left breast.  0.2 cm tumor and negative lymph node ER/PR positive margins were clear RADIATION ONCOLOGIST REPORT HAS BEEN REVIEWED AND NO FURTHER RADIATION HAS BEEN PLANNED IN VIEW OF PATIENT'S STAGE iv ENDOMETRIAL CANCER    INTERVAL HISTORY:  82 year old lady came today further follow-up patient has 5 mm infiltrating ductal carcinoma of breast during the workup patient was found to have recurrent endometrial cancer with extensive retroperitoneal adenopathy and pelvic wall invasion and bladder invasion started on palliative radiation therapy  Patient has significant improvement in pain.  Patient has been scheduled for cystoscopy and change in a stent.  There was some question about discontinuing Plavix.  Patient has been started on Plavix with a previous history of TIA which happened many years ago. Here for further follow-up and treatment consideration Pain is under better control.  Patient is tolerating Megace. Radiation oncologist opinion has been reviewed.  Regarding radiation therapy to the breast        REVIEW OF SYSTEMS:   GENERAL:  Feels good.  Active.  No  fevers, sweats or weight loss. PERFORMANCE STATUS (ECOG):  01 HEENT:  No visual changes, runny nose, sore throat, mouth sores or tenderness. Lungs: No shortness of breath or cough.  No hemoptysis. Cardiac:  No chest pain, palpitations, orthopnea, or PND. Right flank pain.  Right low back pain.  Right pain radiating down her right lower extremity.  Recent cystoscopy is consistent with bladder tumor.  Biopsies positive for endometrial  cancer  Musculoskeletal:  No back pain.  No joint pain.  No muscle tenderness. Extremities:  No pain or swelling. Skin:  No rashes or skin changes. Neuro:  No headache, numbness or weakness, balance or coordination issues. Endocrine:  No diabetes, thyroid issues, hot flashes or night sweats. Psych:  No mood changes, depression or anxiety. Left breast: Status post lumpectomy wound is healing well.   Review of systems:  All other systems reviewed and found to be negative.  As per HPI. Otherwise, a complete review of systems is negatve.  PAST MEDICAL HISTORY: Past Medical History  Diagnosis Date  . History of esophageal stricture   . GERD (gastroesophageal reflux disease)   . Inguinal lymphocyst   . History of brachytherapy   . History of shingles   . History of colon polyps   . Mild aphasia   . Hearing loss   . History of DVT (deep vein thrombosis)   . Hiccups   . Pain     SEVERE CHRONIC RIGHT LEG,HIP,FOOT  . Arthritis     Degenerative Disc Disease  . Gastric ulcer   . Deep vein blood clot of left lower extremity ([place]) February 11, 2015    Left Leg  . Stroke ([place]) 04/06/2011    residual:  aphasia  . Chronic kidney disease     HYDRONEPHROSIS  . Endometrial adenocarcinoma ([place]) 01/2012     stage Ib, grade 1, positive peritoneal cytology, no other risk factors or extra-uterine disease  . Cancer of left breast ([place]) 10/20/2014  . Bone cancer ([place])   . PE (pulmonary embolism)     H/O    PAST SURGICAL HISTORY: Past Surgical History  Procedure Laterality Date  . Esophageal dilation    . Thyroidectomy, partial    . Cataract extraction w/ intraocular lens implant      right  . Back surgery      fusion of L3&4  . Tonsillectomy    . Total abdominal hysterectomy w/ bilateral salpingoophorectomy Bilateral     staging biopsies  . Cystoscopy w/ retrogrades Right 10/28/2014    Procedure: CYSTOSCOPY WITH RETROGRADE PYELOGRAM ATTEMPT, UNABLE TO PASS ;  Surgeon: [name],  MD;  Location: [facility];  Service: Urology;  Laterality: Right;  . Cystoscopy with stent placement Right 10/28/2014    Procedure: BLADDER BIOPSY WITH FULGERATION ;  Surgeon: [name], MD;  Location: [facility];  Service: Urology;  Laterality: Right;  . Thyroid removed    . Eye surgery Bilateral     Cataract Extraction  . Portacath placement Right 11/26/2014    Procedure: INSERTION PORT-A-CATH;  Surgeon: [name], MD;  Location: [facility];  Service: General;  Laterality: Right;  . Peripheral vascular catheterization N/A 02/11/2015    Procedure: IVC Filter Insertion;  Surgeon: Algernon Huxley, MD;  Location: [facility];  Service: Cardiovascular;  Laterality: N/A;  . Abdominal hysterectomy  December 2013    history of cervical cancer  . Cystoscopy w/ ureteral stent placement Right 03/10/2015    Procedure: CYSTOSCOPY WITH STENT  REPLACEMENT;  Surgeon: Hildred Laser, MD;  Location: [facility];  Service: Urology;  Laterality: Right;  . Partial mastectomy with needle localization Left 03/30/2015    Procedure: PARTIAL MASTECTOMY WITH NEEDLE LOCALIZATION;  Surgeon: Nadeen Landau, MD;  Location: [facility];  Service: General;  Laterality: Left;  . Sentinel node biopsy Left 03/30/2015    Procedure: SENTINEL NODE BIOPSY;  Surgeon: Nadeen Landau, MD;  Location: [facility];  Service: General;  Laterality: Left;  . Breast cyst aspiration N/A   . Breast biopsy Left 10/13/2014    path pending    FAMILY HISTORY Family History  Problem Relation Age of Onset  . Stroke Mother   . Breast cancer Maternal Aunt     x 2        ADVANCED DIRECTIVES:  Patient does not have any living will or healthcare power of attorney.  Information was given .  Available resources had been discussed.  We will follow-up on subsequent appointments regarding this issue  HEALTH MAINTENANCE: Social History  Substance Use Topics  . Smoking status: Never Smoker   . Smokeless tobacco: Never Used  .  Alcohol Use: No      Allergies  Allergen Reactions  . Penicillins Rash  . Hydrocodone-Acetaminophen Nausea Only  . Novocain [Procaine] Other (See Comments) and Nausea And Vomiting    Chest pain  . Sulfa Antibiotics Other (See Comments)    CHEST PAIN    Current Outpatient Prescriptions  Medication Sig Dispense Refill  . clopidogrel (PLAVIX) 75 MG tablet Take 75 mg by mouth daily.    . fentaNYL (DURAGESIC - DOSED MCG/HR) 50 MCG/HR Place 1 patch (50 mcg total) onto the skin every 3 (three) days. 10 patch 0  . gabapentin (NEURONTIN) 300 MG capsule Take 1 capsule (300 mg total) by mouth 3 (three) times daily. (Patient taking differently: Take 300 mg by mouth 2 (two) times daily. ) 90 capsule 2  . megestrol (MEGACE) 40 MG tablet Take 1 tablet (40 mg total) by mouth 4 (four) times daily. 120 tablet 3  . meloxicam (MOBIC) 15 MG tablet Take 1 tablet (15 mg total) by mouth daily. (Patient taking differently: Take 15 mg by mouth every morning. ) 30 tablet 2  . Multiple Vitamins-Minerals (MULTIVITAMIN WITH MINERALS) tablet Take 1 tablet by mouth daily.    Marland Kitchen oxyCODONE-acetaminophen (PERCOCET/ROXICET) 5-325 MG tablet Take 1-2 tablets by mouth every 4 (four) hours as needed for severe pain. 90 tablet 0   No current facility-administered medications for this visit.    OBJECTIVE: PHYSICAL EXAM: GENERAL:  Well developed, well nourished, sitting comfortably in the exam room in no acute distress. MENTAL STATUS:  Alert and oriented to person, place and time.  ENT:  Oropharynx clear without lesion.  Tongue normal. Mucous membranes moist.  RESPIRATORY:  Clear to auscultation without rales, wheezes or rhonchi. CARDIOVASCULAR:  Regular rate and rhythm without murmur, rub or gallop.  ABDOMEN:  Soft, non-tender, with active bowel sounds, and no hepatosplenomegaly.  No masses. BACK:   CVA tenderness.  No tenderness on percussion of the back or rib cage. SKIN:  No rashes, ulcers or lesions. EXTREMITIES:  Left lower extremity edema LYMPH NODES: No palpable cervical, supraclavicular, axillary or inguinal adenopathy  NEUROLOGICAL: Unremarkable. PSYCH:  Appropriate.  Filed Vitals:   06/01/15 1151  BP: 146/75  Pulse: 66  Temp: 97.3 F (36.3 C)  Resp: 18     Body mass index is 21.22 kg/(m^2).    ECOG FS:1 - Symptomatic  but completely ambulatory  LAB RESULTS:  CBC Latest Ref Rng 06/01/2015 04/30/2015 04/06/2015  WBC 3.6 - 11.0 K/uL 3.6 3.5(L) 5.5  Hemoglobin 12.0 - 16.0 g/dL 14.1 14.4 13.7  Hematocrit 35.0 - 47.0 % 40.8 41.6 40.0  Platelets 150 - 440 K/uL 246 234 226    STUDIES: No results found.  ASSESSMENT:  Carcinoma of left breast at 9:00 position clinically stage is T1b N0 M0 tumor estrogen receptor positive progesterone receptor positive HER-2/neu receptor pending by fish 2  PATIENT  had lumpectomy with margins clear. Patient is here for ongoing evaluation and treatment consideration regarding endometrial cancer and metastases to the bone ct scan has been reviewed independently.. There is significant decrease in the size of the metastases. Will continue Megace if needed can be changed over to letrozole.  Combination of letrozole and Afinitor has been proven useful in the past to control endometrial cancer  Continue XGEVA Calcium level is 8.6  2.  Cancer  of breast.  No further therapy recommended. 3.chronic  Renal insufficiency. Will be followed Reevaluate patient in 4-6 weeks.   Patient and family at number of questions which were answered

## 2015-06-09 ENCOUNTER — Encounter
Admission: RE | Disposition: A | Discharge status: Home / Self Care | Payer: Self-pay | Source: Ambulatory Visit | Attending: Urology

## 2015-06-09 ENCOUNTER — Ambulatory Visit
Admission: RE | Admit: 2015-06-09 | Discharge: 2015-06-09 | Disposition: A | Discharge status: Home / Self Care | Payer: Medicare Other | Source: Ambulatory Visit | Attending: Urology | Admitting: Urology

## 2015-06-09 ENCOUNTER — Ambulatory Visit: Payer: Medicare Other | Admitting: Anesthesiology

## 2015-06-09 ENCOUNTER — Encounter: Payer: Self-pay | Admitting: *Deleted

## 2015-06-09 DIAGNOSIS — Z853 Personal history of malignant neoplasm of breast: Secondary | ICD-10-CM | POA: Insufficient documentation

## 2015-06-09 DIAGNOSIS — Z961 Presence of intraocular lens: Secondary | ICD-10-CM | POA: Insufficient documentation

## 2015-06-09 DIAGNOSIS — Z90710 Acquired absence of both cervix and uterus: Secondary | ICD-10-CM | POA: Diagnosis not present

## 2015-06-09 DIAGNOSIS — C541 Malignant neoplasm of endometrium: Secondary | ICD-10-CM | POA: Diagnosis present

## 2015-06-09 DIAGNOSIS — Z803 Family history of malignant neoplasm of breast: Secondary | ICD-10-CM | POA: Insufficient documentation

## 2015-06-09 DIAGNOSIS — Z882 Allergy status to sulfonamides status: Secondary | ICD-10-CM | POA: Insufficient documentation

## 2015-06-09 DIAGNOSIS — Z86711 Personal history of pulmonary embolism: Secondary | ICD-10-CM | POA: Diagnosis not present

## 2015-06-09 DIAGNOSIS — Z885 Allergy status to narcotic agent status: Secondary | ICD-10-CM | POA: Insufficient documentation

## 2015-06-09 DIAGNOSIS — N1330 Unspecified hydronephrosis: Secondary | ICD-10-CM | POA: Insufficient documentation

## 2015-06-09 DIAGNOSIS — Z7902 Long term (current) use of antithrombotics/antiplatelets: Secondary | ICD-10-CM | POA: Insufficient documentation

## 2015-06-09 DIAGNOSIS — Z8619 Personal history of other infectious and parasitic diseases: Secondary | ICD-10-CM | POA: Insufficient documentation

## 2015-06-09 DIAGNOSIS — Z86010 Personal history of colonic polyps: Secondary | ICD-10-CM | POA: Diagnosis not present

## 2015-06-09 DIAGNOSIS — R109 Unspecified abdominal pain: Secondary | ICD-10-CM | POA: Diagnosis not present

## 2015-06-09 DIAGNOSIS — Z981 Arthrodesis status: Secondary | ICD-10-CM | POA: Insufficient documentation

## 2015-06-09 DIAGNOSIS — K219 Gastro-esophageal reflux disease without esophagitis: Secondary | ICD-10-CM | POA: Insufficient documentation

## 2015-06-09 DIAGNOSIS — Z9012 Acquired absence of left breast and nipple: Secondary | ICD-10-CM | POA: Diagnosis not present

## 2015-06-09 DIAGNOSIS — Z880 Allergy status to penicillin: Secondary | ICD-10-CM | POA: Diagnosis not present

## 2015-06-09 DIAGNOSIS — Z8541 Personal history of malignant neoplasm of cervix uteri: Secondary | ICD-10-CM | POA: Diagnosis not present

## 2015-06-09 DIAGNOSIS — Z86718 Personal history of other venous thrombosis and embolism: Secondary | ICD-10-CM | POA: Insufficient documentation

## 2015-06-09 DIAGNOSIS — Z8542 Personal history of malignant neoplasm of other parts of uterus: Secondary | ICD-10-CM | POA: Diagnosis not present

## 2015-06-09 DIAGNOSIS — N189 Chronic kidney disease, unspecified: Secondary | ICD-10-CM | POA: Insufficient documentation

## 2015-06-09 DIAGNOSIS — Z9889 Other specified postprocedural states: Secondary | ICD-10-CM | POA: Diagnosis not present

## 2015-06-09 DIAGNOSIS — M1990 Unspecified osteoarthritis, unspecified site: Secondary | ICD-10-CM | POA: Insufficient documentation

## 2015-06-09 DIAGNOSIS — Z791 Long term (current) use of non-steroidal anti-inflammatories (NSAID): Secondary | ICD-10-CM | POA: Insufficient documentation

## 2015-06-09 DIAGNOSIS — Z79899 Other long term (current) drug therapy: Secondary | ICD-10-CM | POA: Diagnosis not present

## 2015-06-09 DIAGNOSIS — Z79891 Long term (current) use of opiate analgesic: Secondary | ICD-10-CM | POA: Insufficient documentation

## 2015-06-09 DIAGNOSIS — Z9841 Cataract extraction status, right eye: Secondary | ICD-10-CM | POA: Insufficient documentation

## 2015-06-09 DIAGNOSIS — Z8719 Personal history of other diseases of the digestive system: Secondary | ICD-10-CM | POA: Diagnosis not present

## 2015-06-09 DIAGNOSIS — E890 Postprocedural hypothyroidism: Secondary | ICD-10-CM | POA: Insufficient documentation

## 2015-06-09 DIAGNOSIS — Z8673 Personal history of transient ischemic attack (TIA), and cerebral infarction without residual deficits: Secondary | ICD-10-CM | POA: Diagnosis not present

## 2015-06-09 DIAGNOSIS — Z823 Family history of stroke: Secondary | ICD-10-CM | POA: Insufficient documentation

## 2015-06-09 DIAGNOSIS — C7951 Secondary malignant neoplasm of bone: Secondary | ICD-10-CM | POA: Diagnosis not present

## 2015-06-09 HISTORY — PX: CYSTOSCOPY W/ URETERAL STENT PLACEMENT: SHX1429

## 2015-06-09 HISTORY — DX: Other pulmonary embolism without acute cor pulmonale: I26.99

## 2015-06-09 SURGERY — CYSTOSCOPY, FLEXIBLE, WITH STENT REPLACEMENT
Anesthesia: General | Site: Ureter | Laterality: Right | Wound class: Clean Contaminated

## 2015-06-09 MED ORDER — CIPROFLOXACIN IN D5W 400 MG/200ML IV SOLN: 400.0000 mg | Freq: Once | INTRAVENOUS | Status: DC

## 2015-06-09 MED ORDER — PROPOFOL 10 MG/ML IV BOLUS
INTRAVENOUS | Status: DC | PRN
  Administered 2015-06-09: 100 mg via INTRAVENOUS

## 2015-06-09 MED ORDER — ONDANSETRON HCL 4 MG/2ML IJ SOLN
INTRAMUSCULAR | Status: DC | PRN
  Administered 2015-06-09: 4 mg via INTRAVENOUS

## 2015-06-09 MED ORDER — MIDAZOLAM HCL 2 MG/2ML IJ SOLN
INTRAMUSCULAR | Status: DC | PRN
  Administered 2015-06-09 (×2): 1 mg via INTRAVENOUS

## 2015-06-09 MED ORDER — CIPROFLOXACIN HCL 500 MG PO TABS: 500.0000 mg | ORAL_TABLET | Freq: Two times a day (BID) | ORAL | Status: DC

## 2015-06-09 MED ORDER — ONDANSETRON HCL 4 MG/2ML IJ SOLN
4.0000 mg | Freq: Once | INTRAMUSCULAR | Status: DC | PRN
Start: 2015-06-09 — End: 2015-06-09

## 2015-06-09 MED ORDER — IOTHALAMATE MEGLUMINE 17.2 % UR SOLN
URETHRAL | Status: DC | PRN
  Administered 2015-06-09: 10 mL via URETHRAL

## 2015-06-09 MED ORDER — FAMOTIDINE 20 MG PO TABS
ORAL_TABLET | ORAL | Status: AC
  Administered 2015-06-09: 20 mg via ORAL
  Filled 2015-06-09: qty 1

## 2015-06-09 MED ORDER — LIDOCAINE HCL (CARDIAC) 20 MG/ML IV SOLN
INTRAVENOUS | Status: DC | PRN
  Administered 2015-06-09: 20 mg via INTRAVENOUS

## 2015-06-09 MED ORDER — FENTANYL CITRATE (PF) 100 MCG/2ML IJ SOLN
INTRAMUSCULAR | Status: DC | PRN
  Administered 2015-06-09: 1 ug via INTRAVENOUS
  Administered 2015-06-09: 50 ug via INTRAVENOUS

## 2015-06-09 MED ORDER — FAMOTIDINE 20 MG PO TABS
20.0000 mg | ORAL_TABLET | Freq: Once | ORAL | Status: AC
  Administered 2015-06-09: 20 mg via ORAL

## 2015-06-09 MED ORDER — FENTANYL CITRATE (PF) 100 MCG/2ML IJ SOLN: 25.0000 ug | INTRAMUSCULAR | Status: DC | PRN

## 2015-06-09 MED ORDER — CIPROFLOXACIN IN D5W 400 MG/200ML IV SOLN
INTRAVENOUS | Status: AC
  Administered 2015-06-09: 400 mg via INTRAVENOUS
  Filled 2015-06-09: qty 200

## 2015-06-09 MED ORDER — LACTATED RINGERS IV SOLN
INTRAVENOUS | Status: DC
  Administered 2015-06-09: 08:00:00 via INTRAVENOUS

## 2015-06-09 SURGICAL SUPPLY — 20 items
BACTOSHIELD CHG 4% 4OZ (MISCELLANEOUS) ×2
CATH URETL 5X70 OPEN END (CATHETERS) ×3 IMPLANT
GLOVE BIO SURGEON STRL SZ7 (GLOVE) ×6 IMPLANT
GLOVE BIO SURGEON STRL SZ7.5 (GLOVE) ×3 IMPLANT
GOWN STRL REUS W/ TWL LRG LVL4 (GOWN DISPOSABLE) ×1 IMPLANT
GOWN STRL REUS W/TWL LRG LVL4 (GOWN DISPOSABLE) ×2
GOWN STRL REUS W/TWL XL LVL3 (GOWN DISPOSABLE) ×3 IMPLANT
KIT RM TURNOVER CYSTO AR (KITS) ×3 IMPLANT
PACK CYSTO AR (MISCELLANEOUS) ×3 IMPLANT
SCRUB CHG 4% DYNA-HEX 4OZ (MISCELLANEOUS) ×1 IMPLANT
SENSORWIRE 0.038 NOT ANGLED (WIRE) ×3
SET CYSTO W/LG BORE CLAMP LF (SET/KITS/TRAYS/PACK) ×3 IMPLANT
SOL .9 NS 3000ML IRR  AL (IV SOLUTION) ×2
SOL .9 NS 3000ML IRR UROMATIC (IV SOLUTION) ×1 IMPLANT
STENT URET 6FRX24 CONTOUR (STENTS) ×3 IMPLANT
STENT URET 6FRX26 CONTOUR (STENTS) ×3 IMPLANT
STENT URETL SOFT 6X24 (Stent) ×3 IMPLANT
SURGILUBE 2OZ TUBE FLIPTOP (MISCELLANEOUS) ×3 IMPLANT
WATER STERILE IRR 1000ML POUR (IV SOLUTION) ×3 IMPLANT
WIRE SENSOR 0.038 NOT ANGLED (WIRE) ×1 IMPLANT

## 2015-06-09 NOTE — Anesthesia Postprocedure Evaluation (Signed)
Anesthesia Post Note  Patient: Traci [name]  Procedure(s) Performed: Procedure(s) (LRB): CYSTOSCOPY WITH STENT REPLACEMENT (Right)  Patient location during evaluation: PACU Anesthesia Type: General Level of consciousness: awake and alert Pain management: pain level controlled Vital Signs Assessment: post-procedure vital signs reviewed and stable Respiratory status: spontaneous breathing, nonlabored ventilation, respiratory function stable and patient connected to nasal cannula oxygen Cardiovascular status: blood pressure returned to baseline and stable Postop Assessment: no signs of nausea or vomiting Anesthetic complications: no    Last Vitals:  Filed Vitals:   06/09/15 0906 06/09/15 0936  BP: 160/69   Pulse: 73 76  Temp: 36.9 C 36.9 C  Resp: 16 16    Last Pain:  Filed Vitals:   06/09/15 0937  PainSc: Asleep                 Martha Clan

## 2015-06-09 NOTE — Op Note (Signed)
Preoperative diagnosis:  1. Malignant right hydroureteronephrosis 2.  Postoperative diagnosis:  1. Malignant right hydroureteronephrosis 2.  Procedure:  1. Cystoscopy 2. Right ureteral stent exchange 6 French by 24 cm 3. Right retrograde pyelogram with interpretation                           Surgeon: Baruch Gouty, MD   Anesthesia: General   Complications: None   Intraoperative findings: Stent was in the correct position at the end of the procedure. Right retrograde pyelogram showed retention of contrast in the right renal pelvis from obstruction.   EBL: None   Specimens: None   Drains: 6 French by 24 cm right double-J ureteral stent   Disposition: Stable to the postanesthesia care unit   Indication for procedure:  The patient is a 82 y.o. female with malignant right hydroureteronephrosis secondary to endometrial carcinoma who presents for stent change. Right kidney is minimally functional however she was having right flank pain from his obstruction. After reviewing the management options for treatment, the patient elected to proceed with the above surgical procedure(s). We have discussed the potential benefits and risks of the procedure, side effects of the proposed treatment, the likelihood of the patient achieving the goals of the procedure, and any potential problems that might occur during the procedure or recuperation. Informed consent has been obtained.   Description of procedure:  The patient was met in the preoperative area. All risks, benefits, and indications of the procedure were described in great detail. The patient consented to the procedure. Preoperative antibiotics were given. The patient was taken to the operative theater. General anesthesia was induced per the anesthesia service. The patient was then placed in the dorsal lithotomy position and prepped and draped in the usual sterile fashion. A preoperative timeout was called. A 21 French 30 cystoscope was inserted  into the patient's bladder per urethra atraumatic. Fluoroscopy showed the stent to be in the correct position. The stent was grasped with flexible graspers and brought to level of the patient's urethral meatus. A sensor wire was placed through the stent up to the patient's right renal pelvis and the fluoroscopy. The stent was removed. Over the sensor wire, an open a catheter was placed to level the renal pelvis. A right retrograde polygrams obtained. This showed retention of contrast with no drainage. The sensor wire was then exchanged again for the open ended catheter. The cystoscope was assembled over the sensor wire. A 6 French by 24 cm double-J ureteral stent was then placed over the sensor wire. The sensor wire was removed. The stent was confirmed to be in the correct position with a curl seen in the patient's right renal pelvis as it was on the preoperative film approximate. A curl was seen in the patient's urinary bladder distally. The patient's bladder was drained. She awoke from anesthesia and transferred stable condition to the postanesthesia care unit.   Plan:  She'll follow-up in 3 months for repeat stent exchange.

## 2015-06-09 NOTE — Transfer of Care (Signed)
Immediate Anesthesia Transfer of Care Note  Patient: Traci [name]  Procedure(s) Performed: Procedure(s): CYSTOSCOPY WITH STENT REPLACEMENT (Right)  Patient Location: PACU  Anesthesia Type:General  Level of Consciousness: awake, alert  and oriented  Airway & Oxygen Therapy: Patient Spontanous Breathing and Patient connected to face mask oxygen  Post-op Assessment: Report given to RN and Post -op Vital signs reviewed and stable  Post vital signs: Reviewed and stable  Last Vitals:  Filed Vitals:   06/09/15 0617  BP: 133/67  Pulse: 66  Temp: 36.8 C  Resp: 18    Last Pain: There were no vitals filed for this visit.       Complications: No apparent anesthesia complications

## 2015-06-09 NOTE — Anesthesia Preprocedure Evaluation (Signed)
Anesthesia Evaluation  Patient identified by MRN, date of birth, ID band Patient awake    Reviewed: Allergy & Precautions, H&P , NPO status , Patient's Chart, lab work & pertinent test results, reviewed documented beta blocker date and time   History of Anesthesia Complications Negative for: history of anesthetic complications  Airway Mallampati: III  TM Distance: >3 FB Neck ROM: full    Dental no notable dental hx. (+) Partial Lower, Partial Upper, Poor Dentition   Pulmonary neg pulmonary ROS,    Pulmonary exam normal breath sounds clear to auscultation       Cardiovascular Exercise Tolerance: Good hypertension, + Peripheral Vascular Disease  Normal cardiovascular exam Rhythm:regular Rate:Normal     Neuro/Psych neg Seizures  Neuromuscular disease CVA negative psych ROS   GI/Hepatic Neg liver ROS, PUD, GERD  ,  Endo/Other  Hypothyroidism   Renal/GU Renal disease  negative genitourinary   Musculoskeletal   Abdominal   Peds  Hematology negative hematology ROS (+)   Anesthesia Other Findings Past Medical History:   History of esophageal stricture                              GERD (gastroesophageal reflux disease)                       Inguinal lymphocyst                                          History of brachytherapy                                     History of shingles                                          History of colon polyps                                      Mild aphasia                                                 Hearing loss                                                 History of DVT (deep vein thrombosis)                        Stroke ([place])                                    04/06/2011      Comment:residual:  aphasia   Hiccups  Pain                                                           Comment:SEVERE CHRONIC RIGHT LEG,HIP,FOOT  Chronic kidney disease                                         Comment:HYDRONEPHROSIS   Endometrial adenocarcinoma ([place])                01/2012        Comment: stage Ib, grade 1, positive peritoneal               cytology, no other risk factors or               extra-uterine disease   Cancer of left breast ([place])                     10/20/2014    Bone cancer ([place])                                            Arthritis                                                      Comment:Degenerative Disc Disease   Gastric ulcer                                                Deep vein blood clot of left lower extremity (* January 5*     Comment:Left Leg   Reproductive/Obstetrics negative OB ROS                             Anesthesia Physical  Anesthesia Plan  ASA: III  Anesthesia Plan: General   Post-op Pain Management:    Induction:   Airway Management Planned:   Additional Equipment:   Intra-op Plan:   Post-operative Plan:   Informed Consent: I have reviewed the patients History and Physical, chart, labs and discussed the procedure including the risks, benefits and alternatives for the proposed anesthesia with the patient or authorized representative who has indicated his/her understanding and acceptance.   Dental Advisory Given  Plan Discussed with: Anesthesiologist, CRNA and Surgeon  Anesthesia Plan Comments:         Anesthesia Quick Evaluation

## 2015-06-09 NOTE — H&P (Deleted)
Preoperative diagnosis:  1. Malignant right hydroureteronephrosis  Postoperative diagnosis:  1. Malignant right hydroureteronephrosis  Procedure: 1. Cystoscopy 2. Right ureteral stent exchange 6 French by 24 cm 3. Right retrograde pyelogram with interpretation  Surgeon: Baruch Gouty, MD  Anesthesia: General  Complications: None  Intraoperative findings: Stent was in the correct position at the end of the procedure. Right retrograde pyelogram showed retention of contrast in the right renal pelvis from obstruction.  EBL: None  Specimens: None  Drains: 6 French by 24 cm right double-J ureteral stent  Disposition: Stable to the postanesthesia care unit  Indication for procedure: The patient is a 82 y.o. female with malignant right hydroureteronephrosis secondary to endometrial carcinoma who presents for stent change. Right kidney is minimally functional however she was having right flank pain from his obstruction. After reviewing the management options for treatment, the patient elected to proceed with the above surgical procedure(s). We have discussed the potential benefits and risks of the procedure, side effects of the proposed treatment, the likelihood of the patient achieving the goals of the procedure, and any potential problems that might occur during the procedure or recuperation. Informed consent has been obtained.  Description of procedure: The patient was met in the preoperative area. All risks, benefits, and indications of the procedure were described in great detail. The patient consented to the procedure. Preoperative antibiotics were given. The patient was taken to the operative theater. General anesthesia was induced per the anesthesia service. The patient was then placed in the dorsal lithotomy position and prepped and draped in the usual sterile fashion. A preoperative timeout was called. A 21 French 30 cystoscope was inserted into the patient's bladder per urethra  atraumatic. Fluoroscopy showed the stent to be in the correct position. The stent was grasped with flexible graspers and brought to level of the patient's urethral meatus. A sensor wire was placed through the stent up to the patient's right renal pelvis and the fluoroscopy. The stent was removed. Over the sensor wire, an open a catheter was placed to level the renal pelvis. A right retrograde polygrams obtained. This showed retention of contrast with no drainage. The sensor wire was then exchanged again for the open ended catheter. The cystoscope was assembled over the sensor wire. A 6 French by 24 cm double-J ureteral stent was then placed over the sensor wire. The sensor wire was removed. The stent was confirmed to be in the correct position with a curl seen in the patient's right renal pelvis as it was on the preoperative film approximate. A curl was seen in the patient's urinary bladder distally. The patient's bladder was drained. She awoke from anesthesia and transferred stable condition to the postanesthesia care unit.  Plan: She'll follow-up in 3 months for repeat stent exchange.

## 2015-06-09 NOTE — H&P (Addendum)
@ENCDATE @ 7:23 AM   Traci [name] 1929-11-17 MD:[id number]  Referring provider: No referring provider defined for this encounter.  No chief complaint on file.   HPI: 82 y.o. Female with right hydronephrosis 2/2 endometrial ca presents for right ureteral stent exchange.   PMH: Past Medical History  Diagnosis Date  . History of esophageal stricture   . GERD (gastroesophageal reflux disease)   . Inguinal lymphocyst   . History of brachytherapy   . History of shingles   . History of colon polyps   . Mild aphasia   . Hearing loss   . History of DVT (deep vein thrombosis)   . Hiccups   . Pain     SEVERE CHRONIC RIGHT LEG,HIP,FOOT  . Arthritis     Degenerative Disc Disease  . Gastric ulcer   . Deep vein blood clot of left lower extremity ([place]) February 11, 2015    Left Leg  . Stroke ([place]) 04/06/2011    residual:  aphasia  . Chronic kidney disease     HYDRONEPHROSIS  . Endometrial adenocarcinoma ([place]) 01/2012     stage Ib, grade 1, positive peritoneal cytology, no other risk factors or extra-uterine disease  . Cancer of left breast ([place]) 10/20/2014  . Bone cancer ([place])   . PE (pulmonary embolism)     H/O    Surgical History: Past Surgical History  Procedure Laterality Date  . Esophageal dilation    . Thyroidectomy, partial    . Cataract extraction w/ intraocular lens implant      right  . Back surgery      fusion of L3&4  . Tonsillectomy    . Total abdominal hysterectomy w/ bilateral salpingoophorectomy Bilateral     staging biopsies  . Cystoscopy w/ retrogrades Right 10/28/2014    Procedure: CYSTOSCOPY WITH RETROGRADE PYELOGRAM ATTEMPT, UNABLE TO PASS ;  Surgeon: [name], MD;  Location: [facility];  Service: Urology;  Laterality: Right;  . Cystoscopy with stent placement Right 10/28/2014    Procedure: BLADDER BIOPSY WITH FULGERATION ;  Surgeon: [name], MD;  Location: [facility];  Service: Urology;  Laterality: Right;  . Thyroid removed    . Eye  surgery Bilateral     Cataract Extraction  . Portacath placement Right 11/26/2014    Procedure: INSERTION PORT-A-CATH;  Surgeon: [name], MD;  Location: [facility];  Service: General;  Laterality: Right;  . Peripheral vascular catheterization N/A 02/11/2015    Procedure: IVC Filter Insertion;  Surgeon: Algernon Huxley, MD;  Location: [facility];  Service: Cardiovascular;  Laterality: N/A;  . Abdominal hysterectomy  December 2013    history of cervical cancer  . Cystoscopy w/ ureteral stent placement Right 03/10/2015    Procedure: CYSTOSCOPY WITH STENT REPLACEMENT;  Surgeon: [name], MD;  Location: [facility];  Service: Urology;  Laterality: Right;  . Partial mastectomy with needle localization Left 03/30/2015    Procedure: PARTIAL MASTECTOMY WITH NEEDLE LOCALIZATION;  Surgeon: [name], MD;  Location: [facility];  Service: General;  Laterality: Left;  . Sentinel node biopsy Left 03/30/2015    Procedure: SENTINEL NODE BIOPSY;  Surgeon: [name], MD;  Location: [facility];  Service: General;  Laterality: Left;  . Breast cyst aspiration N/A   . Breast biopsy Left 10/13/2014    path pending    Home Medications:    Medication List    ASK your doctor about these medications  clopidogrel 75 MG tablet  Commonly known as:  PLAVIX  Take 75 mg by mouth daily.     fentaNYL 50 MCG/HR  Commonly known as:  DURAGESIC - dosed mcg/hr  Place 1 patch (50 mcg total) onto the skin every 3 (three) days.     gabapentin 300 MG capsule  Commonly known as:  NEURONTIN  Take 1 capsule (300 mg total) by mouth 3 (three) times daily.     megestrol 40 MG tablet  Commonly known as:  MEGACE  Take 1 tablet (40 mg total) by mouth 4 (four) times daily.     meloxicam 15 MG tablet  Commonly known as:  MOBIC  Take 1 tablet (15 mg total) by mouth daily.     multivitamin with minerals tablet  Take 1 tablet by mouth daily.     oxyCODONE-acetaminophen 5-325 MG tablet    Commonly known as:  PERCOCET/ROXICET  Take 1-2 tablets by mouth every 4 (four) hours as needed for severe pain.        Allergies:  Allergies  Allergen Reactions  . Penicillins Rash  . Hydrocodone-Acetaminophen Nausea Only  . Novocain [Procaine] Other (See Comments) and Nausea And Vomiting    Chest pain  . Sulfa Antibiotics Other (See Comments)    CHEST PAIN    Family History: Family History  Problem Relation Age of Onset  . Stroke Mother   . Breast cancer Maternal Aunt     x 2    Social History:  reports that she has never smoked. She has never used smokeless tobacco. She reports that she does not drink alcohol or use illicit drugs.  ROS:                                        Physical Exam: BP 133/67 mmHg  Pulse 66  Temp(Src) 98.3 F (36.8 C) (Tympanic)  Resp 18  Ht 5\' 4"  (1.626 m)  Wt 123 lb (55.792 kg)  BMI 21.10 kg/m2  SpO2 99%  Constitutional:  Alert and oriented, No acute distress. HEENT: [place] AT, moist mucus membranes.  Trachea midline, no masses. Cardiovascular: No clubbing, cyanosis, or edema. RRR Respiratory: Normal respiratory effort, no increased work of breathing. GI: Abdomen is soft, nontender, nondistended, no abdominal masses GU: No CVA tenderness.  Skin: No rashes, bruises or suspicious lesions. Lymph: No cervical or inguinal adenopathy. Neurologic: Grossly intact, no focal deficits, moving all 4 extremities. Psychiatric: Normal mood and affect.  Laboratory Data: Lab Results  Component Value Date   WBC 3.6 06/01/2015   HGB 14.1 06/01/2015   HCT 40.8 06/01/2015   MCV 94.6 06/01/2015   PLT 246 06/01/2015    Lab Results  Component Value Date   CREATININE 1.20* 06/01/2015    No results found for: PSA  No results found for: TESTOSTERONE  Lab Results  Component Value Date   HGBA1C 5.5 04/08/2011    Urinalysis    Component Value Date/Time   COLORURINE RED* 03/19/2015 1549   COLORURINE Yellow 03/29/2012  1930   APPEARANCEUR Cloudy* 05/31/2015 1143   APPEARANCEUR CLOUDY* 03/19/2015 1549   APPEARANCEUR Hazy 03/29/2012 1930   LABSPEC 1.025 03/19/2015 1549   LABSPEC 1.023 03/29/2012 1930   PHURINE 5.0 03/19/2015 1549   PHURINE 7.0 03/29/2012 1930   GLUCOSEU Negative 05/31/2015 1143   GLUCOSEU 50 mg/dL 03/29/2012 1930   HGBUR 3+* 03/19/2015 1549   HGBUR Negative 03/29/2012  [place] Negative 05/31/2015 [place] 03/19/2015 1549   BILIRUBINUR Negative 03/29/2012 1930   KETONESUR TRACE* 03/19/2015 1549   KETONESUR 1+ 03/29/2012 1930   PROTEINUR 3+* 05/31/2015 1143   PROTEINUR 100* 03/19/2015 1549   PROTEINUR Negative 03/29/2012 1930   NITRITE Negative 05/31/2015 1143   NITRITE NEGATIVE 03/19/2015 1549   NITRITE Negative 03/29/2012 1930   LEUKOCYTESUR 1+* 05/31/2015 1143   LEUKOCYTESUR 1+* 03/19/2015 1549   LEUKOCYTESUR Negative 03/29/2012 1930     Assessment & Plan:   1. Malignant right hydronephrosis 2. Endometrial carcinoma -cystoscopy, right ureteral stent exchange  [name], [place] [address] [place], [place] [address] [phone]

## 2015-06-09 NOTE — Discharge Instructions (Signed)

## 2015-06-18 ENCOUNTER — Other Ambulatory Visit: Payer: Self-pay | Admitting: *Deleted

## 2015-06-18 DIAGNOSIS — C541 Malignant neoplasm of endometrium: Secondary | ICD-10-CM

## 2015-06-18 MED ORDER — FENTANYL 50 MCG/HR TD PT72: 50.0000 ug | MEDICATED_PATCH | TRANSDERMAL | Status: DC

## 2015-06-18 MED ORDER — OXYCODONE-ACETAMINOPHEN 5-325 MG PO TABS: 1.0000 | ORAL_TABLET | ORAL | Status: DC | PRN

## 2015-06-30 ENCOUNTER — Inpatient Hospital Stay: Payer: Medicare Other | Attending: Oncology

## 2015-06-30 ENCOUNTER — Encounter: Payer: Self-pay | Admitting: Oncology

## 2015-06-30 ENCOUNTER — Inpatient Hospital Stay (HOSPITAL_BASED_OUTPATIENT_CLINIC_OR_DEPARTMENT_OTHER): Payer: Medicare Other | Admitting: Oncology

## 2015-06-30 ENCOUNTER — Inpatient Hospital Stay: Payer: Medicare Other

## 2015-06-30 VITALS — BP 147/74 | HR 60 | Temp 96.7°F | Resp 18 | Wt 124.1 lb

## 2015-06-30 DIAGNOSIS — M1990 Unspecified osteoarthritis, unspecified site: Secondary | ICD-10-CM | POA: Diagnosis not present

## 2015-06-30 DIAGNOSIS — C7951 Secondary malignant neoplasm of bone: Secondary | ICD-10-CM | POA: Insufficient documentation

## 2015-06-30 DIAGNOSIS — Z86711 Personal history of pulmonary embolism: Secondary | ICD-10-CM | POA: Insufficient documentation

## 2015-06-30 DIAGNOSIS — Z7902 Long term (current) use of antithrombotics/antiplatelets: Secondary | ICD-10-CM | POA: Insufficient documentation

## 2015-06-30 DIAGNOSIS — Z79899 Other long term (current) drug therapy: Secondary | ICD-10-CM | POA: Insufficient documentation

## 2015-06-30 DIAGNOSIS — Z923 Personal history of irradiation: Secondary | ICD-10-CM | POA: Insufficient documentation

## 2015-06-30 DIAGNOSIS — C50812 Malignant neoplasm of overlapping sites of left female breast: Secondary | ICD-10-CM

## 2015-06-30 DIAGNOSIS — C541 Malignant neoplasm of endometrium: Secondary | ICD-10-CM

## 2015-06-30 DIAGNOSIS — Z90722 Acquired absence of ovaries, bilateral: Secondary | ICD-10-CM

## 2015-06-30 DIAGNOSIS — Z79811 Long term (current) use of aromatase inhibitors: Secondary | ICD-10-CM | POA: Diagnosis not present

## 2015-06-30 DIAGNOSIS — C7911 Secondary malignant neoplasm of bladder: Secondary | ICD-10-CM | POA: Diagnosis not present

## 2015-06-30 DIAGNOSIS — Z803 Family history of malignant neoplasm of breast: Secondary | ICD-10-CM | POA: Insufficient documentation

## 2015-06-30 DIAGNOSIS — N189 Chronic kidney disease, unspecified: Secondary | ICD-10-CM | POA: Diagnosis not present

## 2015-06-30 DIAGNOSIS — Z86718 Personal history of other venous thrombosis and embolism: Secondary | ICD-10-CM | POA: Diagnosis not present

## 2015-06-30 DIAGNOSIS — Z8673 Personal history of transient ischemic attack (TIA), and cerebral infarction without residual deficits: Secondary | ICD-10-CM | POA: Diagnosis not present

## 2015-06-30 DIAGNOSIS — Z170 Estrogen receptor positive status [ER+]: Secondary | ICD-10-CM

## 2015-06-30 DIAGNOSIS — K219 Gastro-esophageal reflux disease without esophagitis: Secondary | ICD-10-CM | POA: Diagnosis not present

## 2015-06-30 DIAGNOSIS — C50912 Malignant neoplasm of unspecified site of left female breast: Secondary | ICD-10-CM

## 2015-06-30 LAB — COMPREHENSIVE METABOLIC PANEL
ALBUMIN: 3.8 g/dL (ref 3.5–5.0)
ALK PHOS: 35 U/L — AB (ref 38–126)
ALT: 15 U/L (ref 14–54)
AST: 21 U/L (ref 15–41)
Anion gap: 5 (ref 5–15)
BILIRUBIN TOTAL: 0.6 mg/dL (ref 0.3–1.2)
BUN: 28 mg/dL — AB (ref 6–20)
CO2: 24 mmol/L (ref 22–32)
Calcium: 8.4 mg/dL — ABNORMAL LOW (ref 8.9–10.3)
Chloride: 110 mmol/L (ref 101–111)
Creatinine, Ser: 1.29 mg/dL — ABNORMAL HIGH (ref 0.44–1.00)
GFR calc Af Amer: 43 mL/min — ABNORMAL LOW (ref >60)
GFR, EST NON AFRICAN AMERICAN: 37 mL/min — AB (ref >60)
GLUCOSE: 96 mg/dL (ref 65–99)
Potassium: 4.5 mmol/L (ref 3.5–5.1)
Sodium: 139 mmol/L (ref 135–145)
TOTAL PROTEIN: 6.6 g/dL (ref 6.5–8.1)

## 2015-06-30 LAB — CBC WITH DIFFERENTIAL/PLATELET
Basophils Absolute: 0 10*3/uL (ref 0–0.1)
Basophils Relative: 1 %
EOS ABS: 0.3 10*3/uL (ref 0–0.7)
Eosinophils Relative: 6 %
HEMATOCRIT: 38.4 % (ref 35.0–47.0)
HEMOGLOBIN: 13.1 g/dL (ref 12.0–16.0)
LYMPHS ABS: 1 10*3/uL (ref 1.0–3.6)
LYMPHS PCT: 24 %
MCH: 32.3 pg (ref 26.0–34.0)
MCHC: 34 g/dL (ref 32.0–36.0)
MCV: 94.9 fL (ref 80.0–100.0)
MONOS PCT: 12 %
Monocytes Absolute: 0.5 10*3/uL (ref 0.2–0.9)
NEUTROS ABS: 2.4 10*3/uL (ref 1.4–6.5)
NEUTROS PCT: 57 %
Platelets: 236 10*3/uL (ref 150–440)
RBC: 4.04 MIL/uL (ref 3.80–5.20)
RDW: 13.1 % (ref 11.5–14.5)
WBC: 4.3 10*3/uL (ref 3.6–11.0)

## 2015-06-30 MED ORDER — DENOSUMAB 120 MG/1.7ML ~~LOC~~ SOLN
120.0000 mg | Freq: Once | SUBCUTANEOUS | Status: AC
  Administered 2015-06-30: 120 mg via SUBCUTANEOUS
  Filled 2015-06-30: qty 1.7

## 2015-06-30 NOTE — Progress Notes (Signed)
[place] @ [place] [phone]  [phone]  Progress note  Traci [name] OB: 1929/04/12  MR#: [id number]  QMG#:[id number]  Patient Care Team: Idelle Crouch, MD as PCP - General (Unknown Physician Specialty) [name], MD as Referring Physician (Surgery) [name], MD as Consulting Physician (Urology)  CHIEF COMPLAINT:  Chief Complaint  Patient presents with  . Breast Cancer  1/  VISIT DIAGNOSIS:   No diagnosis found.  Oncology History   1.  Carcinoma of left breast  IMPRESSION: Ultrasound-guided biopsy of a suspicious left breast mass at 9 o'clock. No apparent complications.   2.  Estrogen receptor positive.  Progesterone receptor positive.  HER-2 receptor 2+ by  IHC . Fish is pending  5 mm tumor clinically stage IB N0 M0 tumor   3.  Patient has a previous history of endometrium  carcinoma of endometrium stage IB disease status post bilateral salpingo-oophorectomy and radiation therapy 4.  Right hydronephrosis detected on MRI scan off lumbosacral spine.  Cystoscopy with attempted stent placement revealed bladder tumor in September of 2016 biopsies positive for recurrent endometrial cancer  5.  Patient had  stent placement in the right kidney Started radiation therapy 6.  Patient has finished radiation therapy with relief in the pain (November, 2016) 7.  Started on Megace in December of 2016 8 deep vein thrombosis in the left lower extremity (February 08, 2014) Hematuria due to   xeralto IVC filter placement on February 10, 2014 She  is off xeralto  9.condition recently had a cystoscopy no evidence of bleeding was found so patient has been started on  ELOQUIS (March 15, 2015) 10.  Patient again had a significant bleeding with hematuria with eloquis and has been discontinued. Patient is on Plavix.  And Megace. 11.  Patient had lumpectomy of the left breast.  0.2 cm tumor and negative lymph node ER/PR positive margins were  clear RADIATION ONCOLOGIST REPORT HAS BEEN REVIEWED AND NO FURTHER RADIATION HAS BEEN PLANNED IN VIEW OF PATIENT'S STAGE iv ENDOMETRIAL CANCER    INTERVAL HISTORY:  82 year old lady came today further follow-up patient has 5 mm infiltrating ductal carcinoma of breast during the workup patient was found to have recurrent endometrial cancer with extensive retroperitoneal adenopathy and pelvic wall invasion and bladder invasion started on palliative radiation therapy  Patient has significant improvement in pain.  Patient has been scheduled for cystoscopy and change in a stent.  There was some question about discontinuing Plavix.  Patient has been started on Plavix with a previous history of TIA which happened many years ago. Here for further follow-up and treatment consideration Pain is under better control.  Patient is tolerating Megace. Radiation oncologist opinion has been reviewed.  Regarding radiation therapy to the breast   She is continuing Megace.  Pelvic pain is improved.  No nausea no vomiting.      REVIEW OF SYSTEMS:   GENERAL:  Feels good.  Active.  No fevers, sweats or weight loss. PERFORMANCE STATUS (ECOG):  01 HEENT:  No visual changes, runny nose, sore throat, mouth sores or tenderness. Lungs: No shortness of breath or cough.  No hemoptysis. Cardiac:  No chest pain, palpitations, orthopnea, or PND. Right flank pain.  Right low back pain.  Right pain radiating down her right lower extremity.  Recent cystoscopy is consistent with bladder tumor.  Biopsies positive for endometrial cancer  Musculoskeletal:  No back pain.  No joint pain.  No muscle tenderness. Extremities:  No pain or  swelling. Skin:  No rashes or skin changes. Neuro:  No headache, numbness or weakness, balance or coordination issues. Endocrine:  No diabetes, thyroid issues, hot flashes or night sweats. Psych:  No mood changes, depression or anxiety. Left breast: Status post lumpectomy wound is healing  well.   Review of systems:  All other systems reviewed and found to be negative.  As per HPI. Otherwise, a complete review of systems is negatve.  PAST MEDICAL HISTORY: Past Medical History  Diagnosis Date  . History of esophageal stricture   . GERD (gastroesophageal reflux disease)   . Inguinal lymphocyst   . History of brachytherapy   . History of shingles   . History of colon polyps   . Mild aphasia   . Hearing loss   . History of DVT (deep vein thrombosis)   . Hiccups   . Pain     SEVERE CHRONIC RIGHT LEG,HIP,FOOT  . Arthritis     Degenerative Disc Disease  . Gastric ulcer   . Deep vein blood clot of left lower extremity ([place]) February 11, 2015    Left Leg  . Stroke ([place]) 04/06/2011    residual:  aphasia  . Chronic kidney disease     HYDRONEPHROSIS  . Endometrial adenocarcinoma ([place]) 01/2012     stage Ib, grade 1, positive peritoneal cytology, no other risk factors or extra-uterine disease  . Cancer of left breast ([place]) 10/20/2014  . Bone cancer ([place])   . PE (pulmonary embolism)     H/O    PAST SURGICAL HISTORY: Past Surgical History  Procedure Laterality Date  . Esophageal dilation    . Thyroidectomy, partial    . Cataract extraction w/ intraocular lens implant      right  . Back surgery      fusion of L3&4  . Tonsillectomy    . Total abdominal hysterectomy w/ bilateral salpingoophorectomy Bilateral     staging biopsies  . Cystoscopy w/ retrogrades Right 10/28/2014    Procedure: CYSTOSCOPY WITH RETROGRADE PYELOGRAM ATTEMPT, UNABLE TO PASS ;  Surgeon: [name], MD;  Location: [facility];  Service: Urology;  Laterality: Right;  . Cystoscopy with stent placement Right 10/28/2014    Procedure: BLADDER BIOPSY WITH FULGERATION ;  Surgeon: [name], MD;  Location: [facility];  Service: Urology;  Laterality: Right;  . Thyroid removed    . Eye surgery Bilateral     Cataract Extraction  . Portacath placement Right 11/26/2014    Procedure: INSERTION  PORT-A-CATH;  Surgeon: [name], MD;  Location: [facility];  Service: General;  Laterality: Right;  . Peripheral vascular catheterization N/A 02/11/2015    Procedure: IVC Filter Insertion;  Surgeon: Algernon Huxley, MD;  Location: [facility];  Service: Cardiovascular;  Laterality: N/A;  . Abdominal hysterectomy  December 2013    history of cervical cancer  . Cystoscopy w/ ureteral stent placement Right 03/10/2015    Procedure: CYSTOSCOPY WITH STENT REPLACEMENT;  Surgeon: [name], MD;  Location: [facility];  Service: Urology;  Laterality: Right;  . Partial mastectomy with needle localization Left 03/30/2015    Procedure: PARTIAL MASTECTOMY WITH NEEDLE LOCALIZATION;  Surgeon: [name], MD;  Location: [facility];  Service: General;  Laterality: Left;  . Sentinel node biopsy Left 03/30/2015    Procedure: SENTINEL NODE BIOPSY;  Surgeon: [name], MD;  Location: [facility];  Service: General;  Laterality: Left;  . Breast cyst aspiration N/A   . Breast biopsy  Left 10/13/2014    path pending  . Cystoscopy w/ ureteral stent placement Right 06/09/2015    Procedure: CYSTOSCOPY WITH STENT REPLACEMENT;  Surgeon: [name], MD;  Location: [facility];  Service: Urology;  Laterality: Right;    FAMILY HISTORY Family History  Problem Relation Age of Onset  . Stroke Mother   . Breast cancer Maternal Aunt     x 2        ADVANCED DIRECTIVES:  Patient does not have any living will or healthcare power of attorney.  Information was given .  Available resources had been discussed.  We will follow-up on subsequent appointments regarding this issue  HEALTH MAINTENANCE: Social History  Substance Use Topics  . Smoking status: Never Smoker   . Smokeless tobacco: Never Used  . Alcohol Use: No      Allergies  Allergen Reactions  . Penicillins Rash  . Hydrocodone-Acetaminophen Nausea Only  . Novocain [Procaine] Other (See Comments) and Nausea And Vomiting    Chest  pain  . Sulfa Antibiotics Other (See Comments)    CHEST PAIN    Current Outpatient Prescriptions  Medication Sig Dispense Refill  . ciprofloxacin (CIPRO) 500 MG tablet     . clopidogrel (PLAVIX) 75 MG tablet Take 75 mg by mouth daily. Reported on 06/30/2015    . fentaNYL (DURAGESIC - DOSED MCG/HR) 50 MCG/HR Place 1 patch (50 mcg total) onto the skin every 3 (three) days. (Patient not taking: Reported on 06/30/2015) 10 patch 0  . gabapentin (NEURONTIN) 300 MG capsule Take 1 capsule (300 mg total) by mouth 3 (three) times daily. (Patient not taking: Reported on 06/30/2015) 90 capsule 2  . megestrol (MEGACE) 40 MG tablet Take 1 tablet (40 mg total) by mouth 4 (four) times daily. (Patient not taking: Reported on 06/30/2015) 120 tablet 3  . meloxicam (MOBIC) 15 MG tablet Take 1 tablet (15 mg total) by mouth daily. (Patient not taking: Reported on 06/30/2015) 30 tablet 2  . Multiple Vitamins-Minerals (MULTIVITAMIN WITH MINERALS) tablet Take 1 tablet by mouth daily. Reported on 06/30/2015    . oxyCODONE-acetaminophen (PERCOCET/ROXICET) 5-325 MG tablet Take 1-2 tablets by mouth every 4 (four) hours as needed for severe pain. (Patient not taking: Reported on 06/30/2015) 90 tablet 0   No current facility-administered medications for this visit.    OBJECTIVE: PHYSICAL EXAM: GENERAL:  Well developed, well nourished, sitting comfortably in the exam room in no acute distress. MENTAL STATUS:  Alert and oriented to person, place and time.  ENT:  Oropharynx clear without lesion.  Tongue normal. Mucous membranes moist.  RESPIRATORY:  Clear to auscultation without rales, wheezes or rhonchi. CARDIOVASCULAR:  Regular rate and rhythm without murmur, rub or gallop.  ABDOMEN:  Soft, non-tender, with active bowel sounds, and no hepatosplenomegaly.  No masses. BACK:   CVA tenderness.  No tenderness on percussion of the back or rib cage. SKIN:  No rashes, ulcers or lesions. EXTREMITIES: Left lower extremity edema LYMPH  NODES: No palpable cervical, supraclavicular, axillary or inguinal adenopathy  NEUROLOGICAL: Unremarkable. PSYCH:  Appropriate.  Filed Vitals:   06/30/15 1147  BP: 147/74  Pulse: 60  Temp: 96.7 F (35.9 C)  Resp: 18     Body mass index is 21.29 kg/(m^2).    ECOG FS:1 - Symptomatic but completely ambulatory  LAB RESULTS:  CBC Latest Ref Rng 06/30/2015 06/01/2015 04/30/2015  WBC 3.6 - 11.0 K/uL 4.3 3.6 3.5(L)  Hemoglobin 12.0 - 16.0 g/dL 13.1 14.1 14.4  Hematocrit 35.0 - 47.0 %  38.4 40.8 41.6  Platelets 150 - 440 K/uL 236 246 234    STUDIES: No results found.  ASSESSMENT:  Carcinoma of left breast at 9:00 position clinically stage is T1b N0 M0 tumor estrogen receptor positive progesterone receptor positive HER-2/neu receptor pending by fish 2  PATIENT  had lumpectomy with margins clear. Patient is here for ongoing evaluation and treatment consideration regarding endometrial cancer and metastases to the bone ct scan has been reviewed independently.. There is significant decrease in the size of the metastases. Will continue Megace if needed can be changed over to letrozole.  Combination of letrozole and Afinitor has been proven useful in the past to control endometrial cancer  Continue XGEVA Calcium level is 8.6  2.  Cancer  of breast.  No further therapy recommended. 3.chronic  Renal insufficiency. Will be followed Repeat PET scan in August of 2017 In my absence patient be evaluated by my associate   Patient and family at number of questions which were answered

## 2015-07-01 ENCOUNTER — Other Ambulatory Visit: Payer: Medicare Other

## 2015-07-01 ENCOUNTER — Ambulatory Visit: Payer: Medicare Other

## 2015-07-01 ENCOUNTER — Ambulatory Visit: Payer: Medicare Other | Admitting: Oncology

## 2015-07-05 ENCOUNTER — Encounter: Payer: Self-pay | Admitting: Oncology

## 2015-07-13 ENCOUNTER — Other Ambulatory Visit: Payer: Self-pay | Admitting: *Deleted

## 2015-07-13 MED ORDER — OXYCODONE-ACETAMINOPHEN 5-325 MG PO TABS: 1.0000 | ORAL_TABLET | ORAL | Status: DC | PRN

## 2015-07-23 ENCOUNTER — Other Ambulatory Visit: Payer: Self-pay | Admitting: *Deleted

## 2015-07-23 DIAGNOSIS — C541 Malignant neoplasm of endometrium: Secondary | ICD-10-CM

## 2015-07-23 DIAGNOSIS — C50912 Malignant neoplasm of unspecified site of left female breast: Secondary | ICD-10-CM

## 2015-07-23 MED ORDER — GABAPENTIN 300 MG PO CAPS: 300.0000 mg | ORAL_CAPSULE | Freq: Three times a day (TID) | ORAL | Status: DC

## 2015-07-23 MED ORDER — MELOXICAM 15 MG PO TABS: 15.0000 mg | ORAL_TABLET | Freq: Every day | ORAL | Status: DC

## 2015-07-23 MED ORDER — FENTANYL 50 MCG/HR TD PT72: 50.0000 ug | MEDICATED_PATCH | TRANSDERMAL | Status: DC

## 2015-07-27 ENCOUNTER — Other Ambulatory Visit: Payer: Self-pay | Admitting: *Deleted

## 2015-07-27 DIAGNOSIS — C541 Malignant neoplasm of endometrium: Secondary | ICD-10-CM

## 2015-07-28 ENCOUNTER — Inpatient Hospital Stay: Payer: Medicare Other

## 2015-07-28 ENCOUNTER — Inpatient Hospital Stay: Payer: Medicare Other | Attending: Internal Medicine | Admitting: Internal Medicine

## 2015-07-28 VITALS — BP 125/68 | HR 63 | Temp 98.2°F | Resp 18 | Wt 126.2 lb

## 2015-07-28 DIAGNOSIS — C541 Malignant neoplasm of endometrium: Secondary | ICD-10-CM | POA: Insufficient documentation

## 2015-07-28 DIAGNOSIS — Z7901 Long term (current) use of anticoagulants: Secondary | ICD-10-CM | POA: Insufficient documentation

## 2015-07-28 DIAGNOSIS — Z86718 Personal history of other venous thrombosis and embolism: Secondary | ICD-10-CM | POA: Diagnosis not present

## 2015-07-28 DIAGNOSIS — Z923 Personal history of irradiation: Secondary | ICD-10-CM | POA: Diagnosis not present

## 2015-07-28 DIAGNOSIS — M1990 Unspecified osteoarthritis, unspecified site: Secondary | ICD-10-CM | POA: Insufficient documentation

## 2015-07-28 DIAGNOSIS — Z170 Estrogen receptor positive status [ER+]: Secondary | ICD-10-CM | POA: Insufficient documentation

## 2015-07-28 DIAGNOSIS — Z8673 Personal history of transient ischemic attack (TIA), and cerebral infarction without residual deficits: Secondary | ICD-10-CM

## 2015-07-28 DIAGNOSIS — C50812 Malignant neoplasm of overlapping sites of left female breast: Secondary | ICD-10-CM | POA: Diagnosis not present

## 2015-07-28 DIAGNOSIS — K219 Gastro-esophageal reflux disease without esophagitis: Secondary | ICD-10-CM | POA: Insufficient documentation

## 2015-07-28 DIAGNOSIS — Z90722 Acquired absence of ovaries, bilateral: Secondary | ICD-10-CM | POA: Diagnosis not present

## 2015-07-28 DIAGNOSIS — Z79811 Long term (current) use of aromatase inhibitors: Secondary | ICD-10-CM | POA: Diagnosis not present

## 2015-07-28 DIAGNOSIS — C7911 Secondary malignant neoplasm of bladder: Secondary | ICD-10-CM | POA: Insufficient documentation

## 2015-07-28 DIAGNOSIS — N289 Disorder of kidney and ureter, unspecified: Secondary | ICD-10-CM | POA: Diagnosis not present

## 2015-07-28 DIAGNOSIS — Z79899 Other long term (current) drug therapy: Secondary | ICD-10-CM | POA: Diagnosis not present

## 2015-07-28 DIAGNOSIS — C7951 Secondary malignant neoplasm of bone: Secondary | ICD-10-CM | POA: Insufficient documentation

## 2015-07-28 DIAGNOSIS — C50912 Malignant neoplasm of unspecified site of left female breast: Secondary | ICD-10-CM

## 2015-07-28 LAB — COMPREHENSIVE METABOLIC PANEL
ALT: 14 U/L (ref 14–54)
AST: 20 U/L (ref 15–41)
Albumin: 3.9 g/dL (ref 3.5–5.0)
Alkaline Phosphatase: 33 U/L — ABNORMAL LOW (ref 38–126)
Anion gap: 5 (ref 5–15)
BUN: 28 mg/dL — AB (ref 6–20)
CHLORIDE: 109 mmol/L (ref 101–111)
CO2: 25 mmol/L (ref 22–32)
CREATININE: 1.27 mg/dL — AB (ref 0.44–1.00)
Calcium: 9 mg/dL (ref 8.9–10.3)
GFR calc Af Amer: 44 mL/min — ABNORMAL LOW (ref >60)
GFR calc non Af Amer: 38 mL/min — ABNORMAL LOW (ref >60)
Glucose, Bld: 84 mg/dL (ref 65–99)
POTASSIUM: 4.3 mmol/L (ref 3.5–5.1)
SODIUM: 139 mmol/L (ref 135–145)
Total Bilirubin: 0.5 mg/dL (ref 0.3–1.2)
Total Protein: 6.5 g/dL (ref 6.5–8.1)

## 2015-07-28 LAB — CBC WITH DIFFERENTIAL/PLATELET
BASOS ABS: 0 10*3/uL (ref 0–0.1)
BASOS PCT: 1 %
EOS ABS: 0.3 10*3/uL (ref 0–0.7)
EOS PCT: 6 %
HCT: 39.5 % (ref 35.0–47.0)
Hemoglobin: 13.5 g/dL (ref 12.0–16.0)
Lymphocytes Relative: 22 %
Lymphs Abs: 1 10*3/uL (ref 1.0–3.6)
MCH: 32.4 pg (ref 26.0–34.0)
MCHC: 34.2 g/dL (ref 32.0–36.0)
MCV: 94.7 fL (ref 80.0–100.0)
Monocytes Absolute: 0.5 10*3/uL (ref 0.2–0.9)
Monocytes Relative: 12 %
Neutro Abs: 2.7 10*3/uL (ref 1.4–6.5)
Neutrophils Relative %: 59 %
PLATELETS: 225 10*3/uL (ref 150–440)
RBC: 4.17 MIL/uL (ref 3.80–5.20)
RDW: 13 % (ref 11.5–14.5)
WBC: 4.6 10*3/uL (ref 3.6–11.0)

## 2015-07-28 MED ORDER — DENOSUMAB 120 MG/1.7ML ~~LOC~~ SOLN
120.0000 mg | Freq: Once | SUBCUTANEOUS | Status: AC
  Administered 2015-07-28: 120 mg via SUBCUTANEOUS
  Filled 2015-07-28: qty 1.7

## 2015-07-28 NOTE — Assessment & Plan Note (Signed)
Metastatic endometrial cancer to the bone- currently on Megace. Tolerating it well. Recent CT of the abdomen and pelvis- improved response. Continue Megace.  # Metastatic lesions to the bone on X-geva- tolerating it well. Crit 127. Recommend hydration. Calcium within normal limits.  # Follow-up in 4 weeks; CBC CMP. Patient has a PET scan ordered in August and 2017.

## 2015-07-28 NOTE — Progress Notes (Signed)
[place] OFFICE PROGRESS NOTE  Patient Care Team: Idelle Crouch, MD as PCP - General (Unknown Physician Specialty) [name], MD as Referring Physician (Surgery) [name], MD as Consulting Physician (Urology)  Cancer of left breast [facility])   Staging form: Breast, AJCC 7th Edition     Clinical: Stage IA (T1b, N0, M0) - Signed by Forest Gleason, MD on 10/20/2014    Oncology History   1.  Carcinoma of left breast  IMPRESSION: Ultrasound-guided biopsy of a suspicious left breast mass at 9 o'clock. No apparent complications.  2. Estrogen receptor positive. Progesterone receptor positive. HER-2 receptor 2+ by IHC . Fish is pending  5 mm tumor clinically stage IB N0 M0 tumor  3. Patient has a previous history of endometrium carcinoma of endometrium stage IB disease status post bilateral salpingo-oophorectomy and radiation therapy 4. Right hydronephrosis detected on MRI scan off lumbosacral spine. Cystoscopy with attempted stent placement revealed bladder tumor in September of 2016 biopsies positive for recurrent endometrial cancer  5. Patient had stent placement in the right kidney Started radiation therapy 6. Patient has finished radiation therapy with relief in the pain (November, 2016) 7. Started on Megace in December of 2016 8 deep vein thrombosis in the left lower extremity (February 08, 2014) Hematuria due to xeralto IVC filter placement on February 10, 2014 She is off xeralto  9.condition recently had a cystoscopy no evidence of bleeding was found so patient has been started on ELOQUIS (March 15, 2015) 10. Patient again had a significant bleeding with hematuria with eloquis and has been discontinued. Patient is on Plavix. And Megace. 11. Patient had lumpectomy of the left breast. 0.2 cm tumor and negative lymph node ER/PR positive margins were clear RADIATION ONCOLOGIST REPORT HAS BEEN REVIEWED AND NO FURTHER RADIATION  HAS BEEN PLANNED IN VIEW OF PATIENT'S STAGE iv ENDOMETRIAL CANCER- on Megace  IMPRESSION: 1. Decrease in size of expansile lesion the RIGHT iliac bone. 2. Decreased in prominence of LEFT pelvic sidewall lesion and peritoneal metastasis. 3. No evidence of disease progression. 4. No significant change in RIGHT ureteral stent.     Cancer of left breast ([place])   10/20/2014 Initial Diagnosis Cancer of left breast    Endometrial cancer ([place])   11/18/2014 Initial Diagnosis Endometrial cancer ([place])     INTERVAL HISTORY:  Traci [name] 82 y.o.  female pleasant patient above history of Metastatic endometrial cancer currently on Megace; and history of breast cancer is here for follow-up.  Patient denies any abdominal pain. Denies any new onset of bone pain. Appetite is good. Denies any jaw pain. No chest pain shortness of the cough.   REVIEW OF SYSTEMS:  A complete 10 point review of system is done which is negative except mentioned above/history of present illness.   PAST MEDICAL HISTORY :  Past Medical History  Diagnosis Date  . History of esophageal stricture   . GERD (gastroesophageal reflux disease)   . Inguinal lymphocyst   . History of brachytherapy   . History of shingles   . History of colon polyps   . Mild aphasia   . Hearing loss   . History of DVT (deep vein thrombosis)   . Hiccups   . Pain     SEVERE CHRONIC RIGHT LEG,HIP,FOOT  . Arthritis     Degenerative Disc Disease  . Gastric ulcer   . Deep vein blood clot of left lower extremity ([place]) February 11, 2015    Left Leg  .  Stroke ([place]) 04/06/2011    residual:  aphasia  . Chronic kidney disease     HYDRONEPHROSIS  . Endometrial adenocarcinoma ([place]) 01/2012     stage Ib, grade 1, positive peritoneal cytology, no other risk factors or extra-uterine disease  . Cancer of left breast ([place]) 10/20/2014  . Bone cancer ([place])   . PE (pulmonary embolism)     H/O    PAST SURGICAL HISTORY :   Past Surgical History   Procedure Laterality Date  . Esophageal dilation    . Thyroidectomy, partial    . Cataract extraction w/ intraocular lens implant      right  . Back surgery      fusion of L3&4  . Tonsillectomy    . Total abdominal hysterectomy w/ bilateral salpingoophorectomy Bilateral     staging biopsies  . Cystoscopy w/ retrogrades Right 10/28/2014    Procedure: CYSTOSCOPY WITH RETROGRADE PYELOGRAM ATTEMPT, UNABLE TO PASS ;  Surgeon: [name], MD;  Location: [facility];  Service: Urology;  Laterality: Right;  . Cystoscopy with stent placement Right 10/28/2014    Procedure: BLADDER BIOPSY WITH FULGERATION ;  Surgeon: [name], MD;  Location: [facility] OR[facility];  Service: Urology;  Laterality: Right;  . Thyroid removed    . Eye surgery Bilateral     Cataract Extraction  . Portacath placement Right 11/26/2014    Procedure: INSERTION PORT-A-CATH;  Surgeon: [name], MD;  Location: [facility] OR[facility];  Service: General;  Laterality: Right;  . Peripheral vascular catheterization N/A 02/11/2015    Procedure: IVC Filter Insertion;  Surgeon: Algernon Huxley, MD;  Location: [facility];  Service: Cardiovascular;  Laterality: N/A;  . Abdominal hysterectomy  December 2013    history of cervical cancer  . Cystoscopy w/ ureteral stent placement Right 03/10/2015    Procedure: CYSTOSCOPY WITH STENT REPLACEMENT;  Surgeon: [name], MD;  Location: [facility];  Service: Urology;  Laterality: Right;  . Partial mastectomy with needle localization Left 03/30/2015    Procedure: PARTIAL MASTECTOMY WITH NEEDLE LOCALIZATION;  Surgeon: [name], MD;  Location: [facility];  Service: General;  Laterality: Left;  . Sentinel node biopsy Left 03/30/2015    Procedure: SENTINEL NODE BIOPSY;  Surgeon: [name], MD;  Location: [facility];  Service: General;  Laterality: Left;  . Breast cyst aspiration N/A   . Breast biopsy Left 10/13/2014    path pending  . Cystoscopy w/ ureteral stent placement Right  06/09/2015    Procedure: CYSTOSCOPY WITH STENT REPLACEMENT;  Surgeon: [name], MD;  Location: [facility];  Service: Urology;  Laterality: Right;    FAMILY HISTORY :   Family History  Problem Relation Age of Onset  . Stroke Mother   . Breast cancer Maternal Aunt     x 2    SOCIAL HISTORY:   Social History  Substance Use Topics  . Smoking status: Never Smoker   . Smokeless tobacco: Never Used  . Alcohol Use: No    ALLERGIES:  is allergic to penicillins; hydrocodone-acetaminophen; novocain; and sulfa antibiotics.  MEDICATIONS:  Current Outpatient Prescriptions  Medication Sig Dispense Refill  . ciprofloxacin (CIPRO) 500 MG tablet     . clopidogrel (PLAVIX) 75 MG tablet Take 75 mg by mouth daily. Reported on 06/30/2015    . fentaNYL (DURAGESIC - DOSED MCG/HR) 50 MCG/HR Place 1 patch (50 mcg total) onto the skin every 3 (three) days. 10 patch 0  . gabapentin (NEURONTIN) 300 MG capsule  Take 1 capsule (300 mg total) by mouth 3 (three) times daily. 90 capsule 2  . megestrol (MEGACE) 40 MG tablet Take 1 tablet (40 mg total) by mouth 4 (four) times daily. 120 tablet 3  . meloxicam (MOBIC) 15 MG tablet Take 1 tablet (15 mg total) by mouth daily. 30 tablet 2  . Multiple Vitamins-Minerals (MULTIVITAMIN WITH MINERALS) tablet Take 1 tablet by mouth daily. Reported on 06/30/2015    . oxyCODONE-acetaminophen (PERCOCET/ROXICET) 5-325 MG tablet Take 1-2 tablets by mouth every 4 (four) hours as needed for severe pain. 90 tablet 0   No current facility-administered medications for this visit.    PHYSICAL EXAMINATION: ECOG PERFORMANCE STATUS: 1 - Symptomatic but completely ambulatory  BP 125/68 mmHg  Pulse 63  Temp(Src) 98.2 F (36.8 C) (Tympanic)  Resp 18  Wt 126 lb 3.4 oz (57.25 kg)  Filed Weights   07/28/15 1351  Weight: 126 lb 3.4 oz (57.25 kg)    GENERAL: Well-nourished well-developed; Alert, no distress and comfortable. With her husband.  EYES: no pallor or  icterus OROPHARYNX: no thrush or ulceration; good dentition  NECK: supple, no masses felt LYMPH:  no palpable lymphadenopathy in the cervical, axillary or inguinal regions LUNGS: clear to auscultation and  No wheeze or crackles HEART/CVS: regular rate & rhythm and no murmurs; No lower extremity edema ABDOMEN:abdomen soft, non-tender and normal bowel sounds Musculoskeletal:no cyanosis of digits and no clubbing  PSYCH: alert & oriented x 3 with fluent speech NEURO: no focal motor/sensory deficits SKIN:  no rashes or significant lesions  LABORATORY DATA:  I have reviewed the data as listed    Component Value Date/Time   NA 139 07/28/2015 1315   NA 139 11/04/2013 1257   K 4.3 07/28/2015 1315   K 4.3 03/05/2014 1156   CL 109 07/28/2015 1315   CL 106 11/04/2013 1257   CO2 25 07/28/2015 1315   CO2 26 11/04/2013 1257   GLUCOSE 84 07/28/2015 1315   GLUCOSE 96 11/04/2013 1257   BUN 28* 07/28/2015 1315   BUN 13 11/04/2013 1257   CREATININE 1.27* 07/28/2015 1315   CREATININE 0.90 11/04/2013 1257   CALCIUM 9.0 07/28/2015 1315   CALCIUM 8.5 11/04/2013 1257   PROT 6.5 07/28/2015 1315   PROT 6.8 11/04/2013 1257   ALBUMIN 3.9 07/28/2015 1315   ALBUMIN 3.5 11/04/2013 1257   AST 20 07/28/2015 1315   AST 25 11/04/2013 1257   ALT 14 07/28/2015 1315   ALT 20 11/04/2013 1257   ALKPHOS 33* 07/28/2015 1315   ALKPHOS 64 11/04/2013 1257   BILITOT 0.5 07/28/2015 1315   BILITOT 0.5 11/04/2013 1257   GFRNONAA 38* 07/28/2015 1315   GFRNONAA >60 11/04/2013 1257   GFRNONAA >60 03/29/2012 1650   GFRAA 44* 07/28/2015 1315   GFRAA >60 11/04/2013 1257   GFRAA >60 03/29/2012 1650    No results found for: SPEP, UPEP  Lab Results  Component Value Date   WBC 4.6 07/28/2015   NEUTROABS 2.7 07/28/2015   HGB 13.5 07/28/2015   HCT 39.5 07/28/2015   MCV 94.7 07/28/2015   PLT 225 07/28/2015      Chemistry      Component Value Date/Time   NA 139 07/28/2015 1315   NA 139 11/04/2013 1257   K 4.3  07/28/2015 1315   K 4.3 03/05/2014 1156   CL 109 07/28/2015 1315   CL 106 11/04/2013 1257   CO2 25 07/28/2015 1315   CO2 26 11/04/2013 1257   BUN 28*  07/28/2015 1315   BUN 13 11/04/2013 1257   CREATININE 1.27* 07/28/2015 1315   CREATININE 0.90 11/04/2013 1257      Component Value Date/Time   CALCIUM 9.0 07/28/2015 1315   CALCIUM 8.5 11/04/2013 1257   ALKPHOS 33* 07/28/2015 1315   ALKPHOS 64 11/04/2013 1257   AST 20 07/28/2015 1315   AST 25 11/04/2013 1257   ALT 14 07/28/2015 1315   ALT 20 11/04/2013 1257   BILITOT 0.5 07/28/2015 1315   BILITOT 0.5 11/04/2013 1257       RADIOGRAPHIC STUDIES: I have personally reviewed the radiological images as listed and agreed with the findings in the report. No results found.   ASSESSMENT & PLAN:  Endometrial cancer ([place]) Metastatic endometrial cancer to the bone- currently on Megace. Tolerating it well. Recent CT of the abdomen and pelvis- improved response. Continue Megace.  # Metastatic lesions to the bone on X-geva- tolerating it well. Crit 127. Recommend hydration. Calcium within normal limits.  # Follow-up in 4 weeks; CBC CMP. Patient has a PET scan ordered in August and 2017.    Orders Placed This Encounter  Procedures  . CBC with Differential    Standing Status: Future     Number of Occurrences:      Standing Expiration Date: 07/27/2016  . Comprehensive metabolic panel    Standing Status: Future     Number of Occurrences:      Standing Expiration Date: 07/27/2016    Order Specific Question:  Has the patient fasted?    Answer:  No   All questions were answered. The patient knows to call the clinic with any problems, questions or concerns.      Cammie Sickle, MD 07/28/2015 4:19 PM

## 2015-08-05 ENCOUNTER — Telehealth: Payer: Self-pay | Admitting: *Deleted

## 2015-08-05 ENCOUNTER — Other Ambulatory Visit: Payer: Self-pay | Admitting: *Deleted

## 2015-08-05 DIAGNOSIS — C541 Malignant neoplasm of endometrium: Secondary | ICD-10-CM

## 2015-08-05 DIAGNOSIS — M9080 Osteopathy in diseases classified elsewhere, unspecified site: Secondary | ICD-10-CM

## 2015-08-05 DIAGNOSIS — C50912 Malignant neoplasm of unspecified site of left female breast: Secondary | ICD-10-CM

## 2015-08-05 DIAGNOSIS — E889 Metabolic disorder, unspecified: Secondary | ICD-10-CM

## 2015-08-05 DIAGNOSIS — R630 Anorexia: Secondary | ICD-10-CM

## 2015-08-05 DIAGNOSIS — M898X9 Other specified disorders of bone, unspecified site: Secondary | ICD-10-CM

## 2015-08-05 MED ORDER — MEGESTROL ACETATE 40 MG PO TABS: 40.0000 mg | ORAL_TABLET | Freq: Four times a day (QID) | ORAL | Status: DC

## 2015-08-05 MED ORDER — OXYCODONE-ACETAMINOPHEN 5-325 MG PO TABS: 1.0000 | ORAL_TABLET | ORAL | Status: DC | PRN

## 2015-08-05 NOTE — Telephone Encounter (Signed)
Patient requesting refill for Percocet.

## 2015-08-05 NOTE — Telephone Encounter (Signed)
Patient requesting refill for

## 2015-08-05 NOTE — Telephone Encounter (Addendum)
Percocet RX renewed per md order. Pt made aware that percocet rx is ready to be picked up. She also request rf on megace, which was approved by Dr. Rogue Bussing. Megace rx was sent to pt's pharmacy electronically.

## 2015-08-25 ENCOUNTER — Inpatient Hospital Stay: Payer: Medicare Other

## 2015-08-25 ENCOUNTER — Inpatient Hospital Stay: Payer: Medicare Other | Attending: Internal Medicine | Admitting: Internal Medicine

## 2015-08-25 ENCOUNTER — Ambulatory Visit
Admission: RE | Admit: 2015-08-25 | Discharge: 2015-08-25 | Disposition: A | Discharge status: Home / Self Care | Payer: Medicare Other | Source: Ambulatory Visit | Attending: Internal Medicine | Admitting: Internal Medicine

## 2015-08-25 VITALS — BP 116/68 | HR 64 | Temp 98.4°F | Resp 18 | Wt 123.1 lb

## 2015-08-25 DIAGNOSIS — Z79811 Long term (current) use of aromatase inhibitors: Secondary | ICD-10-CM | POA: Diagnosis not present

## 2015-08-25 DIAGNOSIS — M79662 Pain in left lower leg: Secondary | ICD-10-CM

## 2015-08-25 DIAGNOSIS — M1990 Unspecified osteoarthritis, unspecified site: Secondary | ICD-10-CM | POA: Insufficient documentation

## 2015-08-25 DIAGNOSIS — Z8673 Personal history of transient ischemic attack (TIA), and cerebral infarction without residual deficits: Secondary | ICD-10-CM | POA: Insufficient documentation

## 2015-08-25 DIAGNOSIS — Z170 Estrogen receptor positive status [ER+]: Secondary | ICD-10-CM | POA: Insufficient documentation

## 2015-08-25 DIAGNOSIS — I82432 Acute embolism and thrombosis of left popliteal vein: Secondary | ICD-10-CM | POA: Diagnosis not present

## 2015-08-25 DIAGNOSIS — C7951 Secondary malignant neoplasm of bone: Secondary | ICD-10-CM | POA: Insufficient documentation

## 2015-08-25 DIAGNOSIS — I8002 Phlebitis and thrombophlebitis of superficial vessels of left lower extremity: Secondary | ICD-10-CM | POA: Diagnosis not present

## 2015-08-25 DIAGNOSIS — Z7902 Long term (current) use of antithrombotics/antiplatelets: Secondary | ICD-10-CM | POA: Insufficient documentation

## 2015-08-25 DIAGNOSIS — Z86711 Personal history of pulmonary embolism: Secondary | ICD-10-CM | POA: Insufficient documentation

## 2015-08-25 DIAGNOSIS — K219 Gastro-esophageal reflux disease without esophagitis: Secondary | ICD-10-CM | POA: Diagnosis not present

## 2015-08-25 DIAGNOSIS — C541 Malignant neoplasm of endometrium: Secondary | ICD-10-CM | POA: Insufficient documentation

## 2015-08-25 DIAGNOSIS — N189 Chronic kidney disease, unspecified: Secondary | ICD-10-CM | POA: Diagnosis not present

## 2015-08-25 DIAGNOSIS — Z803 Family history of malignant neoplasm of breast: Secondary | ICD-10-CM | POA: Diagnosis not present

## 2015-08-25 DIAGNOSIS — Z923 Personal history of irradiation: Secondary | ICD-10-CM | POA: Diagnosis not present

## 2015-08-25 DIAGNOSIS — C50812 Malignant neoplasm of overlapping sites of left female breast: Secondary | ICD-10-CM | POA: Insufficient documentation

## 2015-08-25 DIAGNOSIS — M79605 Pain in left leg: Secondary | ICD-10-CM | POA: Insufficient documentation

## 2015-08-25 DIAGNOSIS — Z90722 Acquired absence of ovaries, bilateral: Secondary | ICD-10-CM | POA: Insufficient documentation

## 2015-08-25 DIAGNOSIS — I82412 Acute embolism and thrombosis of left femoral vein: Secondary | ICD-10-CM | POA: Diagnosis not present

## 2015-08-25 DIAGNOSIS — Z79899 Other long term (current) drug therapy: Secondary | ICD-10-CM

## 2015-08-25 DIAGNOSIS — Z7901 Long term (current) use of anticoagulants: Secondary | ICD-10-CM | POA: Diagnosis not present

## 2015-08-25 DIAGNOSIS — C7911 Secondary malignant neoplasm of bladder: Secondary | ICD-10-CM | POA: Insufficient documentation

## 2015-08-25 DIAGNOSIS — Z86718 Personal history of other venous thrombosis and embolism: Secondary | ICD-10-CM | POA: Diagnosis not present

## 2015-08-25 DIAGNOSIS — M7989 Other specified soft tissue disorders: Secondary | ICD-10-CM | POA: Insufficient documentation

## 2015-08-25 LAB — COMPREHENSIVE METABOLIC PANEL
ALBUMIN: 3.8 g/dL (ref 3.5–5.0)
ALK PHOS: 35 U/L — AB (ref 38–126)
ALT: 14 U/L (ref 14–54)
AST: 19 U/L (ref 15–41)
Anion gap: 6 (ref 5–15)
BILIRUBIN TOTAL: 0.7 mg/dL (ref 0.3–1.2)
BUN: 26 mg/dL — AB (ref 6–20)
CALCIUM: 8.4 mg/dL — AB (ref 8.9–10.3)
CO2: 24 mmol/L (ref 22–32)
CREATININE: 1.24 mg/dL — AB (ref 0.44–1.00)
Chloride: 108 mmol/L (ref 101–111)
GFR calc Af Amer: 45 mL/min — ABNORMAL LOW (ref >60)
GFR, EST NON AFRICAN AMERICAN: 39 mL/min — AB (ref >60)
GLUCOSE: 104 mg/dL — AB (ref 65–99)
POTASSIUM: 5.1 mmol/L (ref 3.5–5.1)
Sodium: 138 mmol/L (ref 135–145)
TOTAL PROTEIN: 6.7 g/dL (ref 6.5–8.1)

## 2015-08-25 LAB — CBC WITH DIFFERENTIAL/PLATELET
BASOS ABS: 0.1 10*3/uL (ref 0–0.1)
BASOS PCT: 1 %
Eosinophils Absolute: 0.4 10*3/uL (ref 0–0.7)
Eosinophils Relative: 7 %
HEMATOCRIT: 38.1 % (ref 35.0–47.0)
HEMOGLOBIN: 13.1 g/dL (ref 12.0–16.0)
LYMPHS PCT: 18 %
Lymphs Abs: 1.1 10*3/uL (ref 1.0–3.6)
MCH: 32.8 pg (ref 26.0–34.0)
MCHC: 34.3 g/dL (ref 32.0–36.0)
MCV: 95.5 fL (ref 80.0–100.0)
Monocytes Absolute: 0.6 10*3/uL (ref 0.2–0.9)
Monocytes Relative: 10 %
NEUTROS ABS: 3.9 10*3/uL (ref 1.4–6.5)
NEUTROS PCT: 64 %
Platelets: 199 10*3/uL (ref 150–440)
RBC: 3.99 MIL/uL (ref 3.80–5.20)
RDW: 13.1 % (ref 11.5–14.5)
WBC: 6 10*3/uL (ref 3.6–11.0)

## 2015-08-25 MED ORDER — APIXABAN 5 MG PO TABS: 5.0000 mg | ORAL_TABLET | Freq: Two times a day (BID) | ORAL | Status: DC

## 2015-08-25 NOTE — Progress Notes (Signed)
Patient states she had a fall a few days ago.  She has noticed left leg swelling from her knee down since the fall.

## 2015-08-25 NOTE — Assessment & Plan Note (Addendum)
Metastatic endometrial cancer to the bone- currently on Megace. Tolerating it well. Recent CT of the abdomen and pelvis- improved response. Continue Megace.  # Swelling of the left lower extremity worsening stat Dopplers of the lower extremity DVT extensive. Recommend starting Eliquis  5 mg twice a day. Spoke to Dr.Budzyn-no contraindications. We'll also check with Dr.[name].  Hold plavix/meloxicam  # Metastatic lesions to the bone on X-geva- HOLD X-geva today.   # pain- on Fentanyl/ percocet  # Follow-up in 4 weeks; CBC CMP. Patient has a PET scan ordered in aug  2017.

## 2015-08-25 NOTE — Progress Notes (Signed)
Pt will start on eliquis 5 mg twice daily this evening as directed by Dr. Tish Men. First dose tonight as per md instructions. She understands that if she can not afford this drug she is to contact our cancer center asap.  She was instructed to discontinue her plavix and meloxicam.  Pt education materials provided to patient about DVTs and elquis. I cautioned pt on bleeding precautions and fall precautions. Explained that there is no antidote to eloquis to reverse the bleeding if pt should fall.   Teach back process performed with patient and her husband.

## 2015-08-25 NOTE — Progress Notes (Signed)
[facility] OFFICE PROGRESS NOTE  Patient Care Team: Marguarite Arbour, MD as PCP - General (Unknown Physician Specialty) Nadeen Landau, MD as Referring Physician (Surgery) Hildred Laser, MD as Consulting Physician (Urology)  Cancer of left breast [facility])   Staging form: Breast, AJCC 7th Edition     Clinical: Stage IA (T1b, N0, M0) - Signed by Johney Maine, MD on 10/20/2014    Oncology History   . Patient has a previous history of endometrium carcinoma of endometrium stage IB disease status post bilateral salpingo-oophorectomy and radiation therapy 4. Right hydronephrosis detected on MRI scan off lumbosacral spine. Cystoscopy with attempted stent placement revealed bladder tumor in September of 2016 biopsies positive for recurrent endometrial cancer  5. Patient had stent placement in the right kidney Started radiation therapy 6. Patient has finished radiation therapy with relief in the pain (November, 2016) 7. Started on Megace in December of 2016 8 deep vein thrombosis in the left lower extremity (February 08, 2014) Hematuria due to xeralto IVC filter placement on February 10, 2014 She is off xeralto 9.condition recently had a cystoscopy no evidence of bleeding was found so patient has been started on ELOQUIS (March 15, 2015)  10. Patient again had a significant bleeding with hematuria with eloquis and has been discontinued; Patient is on Plavix. ON  Megace. June 2017- PET IMPROVED   # August 25 2015- LLE DVT- start Eliquis [stop plavix];   FEB 2017-LEFT BREAST CA [9'0 clock- overlapping site] Estrogen receptor positive. Progesterone receptor positive. HER-2 receptor; Her 2neg; 5 mm tumor clinically stage IB N0 M0 tumor;s/p Lumpectomy; NO RT   # MOLECULAR STUDIES: 08/23/15- MMR-ordered.      Cancer of left breast (HCC)   10/20/2014 Initial Diagnosis Cancer of left breast    Endometrial cancer (HCC)   11/18/2014 Initial Diagnosis Endometrial  cancer (HCC)     INTERVAL HISTORY:  Traci [name] 82 y.o.  female pleasant patient above history of Metastatic endometrial cancer currently on Megace- Is here for follow-up.  As per the husband patient noted to have swelling of the left lower extremity over the last 2-3 days. Patient has prior history of left lower extremity DVT on anticoagulation; when anticoagulation was discontinued because of hematuria.   Patient denies any abdominal pain. Denies any new onset of bone pain. Appetite is good. Denies any jaw pain. No chest pain shortness of the cough.   REVIEW OF SYSTEMS:  A complete 10 point review of system is done which is negative except mentioned above/history of present illness.   PAST MEDICAL HISTORY :  Past Medical History  Diagnosis Date  . History of esophageal stricture   . GERD (gastroesophageal reflux disease)   . Inguinal lymphocyst   . History of brachytherapy   . History of shingles   . History of colon polyps   . Mild aphasia   . Hearing loss   . History of DVT (deep vein thrombosis)   . Hiccups   . Pain     SEVERE CHRONIC RIGHT LEG,HIP,FOOT  . Arthritis     Degenerative Disc Disease  . Gastric ulcer   . Deep vein blood clot of left lower extremity (HCC) February 11, 2015    Left Leg  . Stroke (HCC) 04/06/2011    residual:  aphasia  . Chronic kidney disease     HYDRONEPHROSIS  . Endometrial adenocarcinoma (HCC) 01/2012     stage Ib, grade 1, positive peritoneal cytology, no other risk factors  or extra-uterine disease  . Cancer of left breast (HCC) 10/20/2014  . Bone cancer (HCC)   . PE (pulmonary embolism)     H/O    PAST SURGICAL HISTORY :   Past Surgical History  Procedure Laterality Date  . Esophageal dilation    . Thyroidectomy, partial    . Cataract extraction w/ intraocular lens implant      right  . Back surgery      fusion of L3&4  . Tonsillectomy    . Total abdominal hysterectomy w/ bilateral salpingoophorectomy Bilateral     staging  biopsies  . Cystoscopy w/ retrogrades Right 10/28/2014    Procedure: CYSTOSCOPY WITH RETROGRADE PYELOGRAM ATTEMPT, UNABLE TO PASS ;  Surgeon: Hildred Laser, MD;  Location: [facility] ORS;  Service: Urology;  Laterality: Right;  . Cystoscopy with stent placement Right 10/28/2014    Procedure: BLADDER BIOPSY WITH FULGERATION ;  Surgeon: Hildred Laser, MD;  Location: [facility] ORS;  Service: Urology;  Laterality: Right;  . Thyroid removed    . Eye surgery Bilateral     Cataract Extraction  . Portacath placement Right 11/26/2014    Procedure: INSERTION PORT-A-CATH;  Surgeon: Nadeen Landau, MD;  Location: [facility] ORS;  Service: General;  Laterality: Right;  . Peripheral vascular catheterization N/A 02/11/2015    Procedure: IVC Filter Insertion;  Surgeon: Annice Needy, MD;  Location: [facility]IVE [facility];  Service: Cardiovascular;  Laterality: N/A;  . Abdominal hysterectomy  December 2013    history of cervical cancer  . Cystoscopy w/ ureteral stent placement Right 03/10/2015    Procedure: CYSTOSCOPY WITH STENT REPLACEMENT;  Surgeon: Hildred Laser, MD;  Location: [facility] ORS;  Service: Urology;  Laterality: Right;  . Partial mastectomy with needle localization Left 03/30/2015    Procedure: PARTIAL MASTECTOMY WITH NEEDLE LOCALIZATION;  Surgeon: Nadeen Landau, MD;  Location: [facility] OR[facility];  Service: General;  Laterality: Left;  . Sentinel node biopsy Left 03/30/2015    Procedure: SENTINEL NODE BIOPSY;  Surgeon: Nadeen Landau, MD;  Location: [facility] OR[facility];  Service: General;  Laterality: Left;  . Breast cyst aspiration N/A   . Breast biopsy Left 10/13/2014    path pending  . Cystoscopy w/ ureteral stent placement Right 06/09/2015    Procedure: CYSTOSCOPY WITH STENT REPLACEMENT;  Surgeon: Hildred Laser, MD;  Location: [facility] OR[facility];  Service: Urology;  Laterality: Right;    FAMILY HISTORY :   Family History  Problem Relation Age of Onset  . Stroke Mother   . Breast cancer Maternal Aunt     x 2     SOCIAL HISTORY:   Social History  Substance Use Topics  . Smoking status: Never Smoker   . Smokeless tobacco: Never Used  . Alcohol Use: No    ALLERGIES:  is allergic to penicillins; hydrocodone-acetaminophen; novocain; and sulfa antibiotics.  MEDICATIONS:  Current Outpatient Prescriptions  Medication Sig Dispense Refill  . clopidogrel (PLAVIX) 75 MG tablet Take 75 mg by mouth daily. Reported on 06/30/2015    . fentaNYL (DURAGESIC - DOSED MCG/HR) 50 MCG/HR Place 1 patch (50 mcg total) onto the skin every 3 (three) days. 10 patch 0  . gabapentin (NEURONTIN) 300 MG capsule Take 1 capsule (300 mg total) by mouth 3 (three) times daily. 90 capsule 2  . megestrol (MEGACE) 40 MG tablet Take 1 tablet (40 mg total) by mouth 4 (four) times daily. 120 tablet 3  . meloxicam (MOBIC) 15 MG tablet Take 1 tablet (15 mg total)  by mouth daily. 30 tablet 2  . Multiple Vitamins-Minerals (MULTIVITAMIN WITH MINERALS) tablet Take 1 tablet by mouth daily. Reported on 06/30/2015    . oxyCODONE-acetaminophen (PERCOCET/ROXICET) 5-325 MG tablet Take 1-2 tablets by mouth every 4 (four) hours as needed for severe pain. 90 tablet 0  . apixaban (ELIQUIS) 5 MG TABS tablet Take 1 tablet (5 mg total) by mouth 2 (two) times daily. 60 tablet 5   No current facility-administered medications for this visit.    PHYSICAL EXAMINATION: ECOG PERFORMANCE STATUS: 1 - Symptomatic but completely ambulatory  BP 116/68 mmHg  Pulse 64  Temp(Src) 98.4 F (36.9 C) (Tympanic)  Resp 18  Wt 123 lb 2 oz (55.849 kg)  Filed Weights   08/25/15 1448  Weight: 123 lb 2 oz (55.849 kg)    GENERAL: [place] Caucasian female patient walking herself Alert, no distress and comfortable. With her husband.  EYES: no pallor or icterus OROPHARYNX: no thrush or ulceration; good dentition  NECK: supple, no masses felt LYMPH:  no palpable lymphadenopathy in the cervical, axillary or inguinal regions LUNGS: clear to auscultation and  No  wheeze or crackles HEART/CVS: regular rate & rhythm and no murmurs; Left lower extremity swollen compared to the right.  ABDOMEN:abdomen soft, non-tender and normal bowel sounds Musculoskeletal:no cyanosis of digits and no clubbing  PSYCH: alert & oriented x 3 with fluent speech NEURO: no focal motor/sensory deficits SKIN:  no rashes or significant lesions  LABORATORY DATA:  I have reviewed the data as listed    Component Value Date/Time   NA 138 08/25/2015 1415   NA 139 11/04/2013 1257   K 5.1 08/25/2015 1415   K 4.3 03/05/2014 1156   CL 108 08/25/2015 1415   CL 106 11/04/2013 1257   CO2 24 08/25/2015 1415   CO2 26 11/04/2013 1257   GLUCOSE 104* 08/25/2015 1415   GLUCOSE 96 11/04/2013 1257   BUN 26* 08/25/2015 1415   BUN 13 11/04/2013 1257   CREATININE 1.24* 08/25/2015 1415   CREATININE 0.90 11/04/2013 1257   CALCIUM 8.4* 08/25/2015 1415   CALCIUM 8.5 11/04/2013 1257   PROT 6.7 08/25/2015 1415   PROT 6.8 11/04/2013 1257   ALBUMIN 3.8 08/25/2015 1415   ALBUMIN 3.5 11/04/2013 1257   AST 19 08/25/2015 1415   AST 25 11/04/2013 1257   ALT 14 08/25/2015 1415   ALT 20 11/04/2013 1257   ALKPHOS 35* 08/25/2015 1415   ALKPHOS 64 11/04/2013 1257   BILITOT 0.7 08/25/2015 1415   BILITOT 0.5 11/04/2013 1257   GFRNONAA 39* 08/25/2015 1415   GFRNONAA >60 11/04/2013 1257   GFRNONAA >60 03/29/2012 1650   GFRAA 45* 08/25/2015 1415   GFRAA >60 11/04/2013 1257   GFRAA >60 03/29/2012 1650    No results found for: SPEP, UPEP  Lab Results  Component Value Date   WBC 6.0 08/25/2015   NEUTROABS 3.9 08/25/2015   HGB 13.1 08/25/2015   HCT 38.1 08/25/2015   MCV 95.5 08/25/2015   PLT 199 08/25/2015      Chemistry      Component Value Date/Time   NA 138 08/25/2015 1415   NA 139 11/04/2013 1257   K 5.1 08/25/2015 1415   K 4.3 03/05/2014 1156   CL 108 08/25/2015 1415   CL 106 11/04/2013 1257   CO2 24 08/25/2015 1415   CO2 26 11/04/2013 1257   BUN 26* 08/25/2015 1415   BUN 13  11/04/2013 1257   CREATININE 1.24* 08/25/2015 1415   CREATININE 0.90  11/04/2013 1257      Component Value Date/Time   CALCIUM 8.4* 08/25/2015 1415   CALCIUM 8.5 11/04/2013 1257   ALKPHOS 35* 08/25/2015 1415   ALKPHOS 64 11/04/2013 1257   AST 19 08/25/2015 1415   AST 25 11/04/2013 1257   ALT 14 08/25/2015 1415   ALT 20 11/04/2013 1257   BILITOT 0.7 08/25/2015 1415   BILITOT 0.5 11/04/2013 1257       RADIOGRAPHIC STUDIES: I have personally reviewed the radiological images as listed and agreed with the findings in the report. US Venous Img Lower Unilateral Left  08/25/2015  CLINICAL DATA:  Left lower extremity pain and swelling for the past 5 days. History of prior DVT. History of endometrial cancer. Evaluate for acute or chronic DVT. EXAM: LEFT LOWER EXTREMITY VENOUS DOPPLER ULTRASOUND TECHNIQUE: Gray-scale sonography with graded compression, as well as color Doppler and duplex ultrasound were performed to evaluate the lower extremity deep venous systems from the level of the common femoral vein and including the common femoral, femoral, profunda femoral, popliteal and calf veins including the posterior tibial, peroneal and gastrocnemius veins when visible. The superficial great saphenous vein was also interrogated. Spectral Doppler was utilized to evaluate flow at rest and with distal augmentation maneuvers in the common femoral, femoral and popliteal veins. COMPARISON:  Left lower extremity venous Doppler ultrasound- 02/09/2015 ; bilateral lower extremity venous Doppler ultrasound - 06/16/2013 FINDINGS: Contralateral Common Femoral Vein: Respiratory phasicity is normal and symmetric with the symptomatic side. No evidence of thrombus. Normal compressibility. Common Femoral Vein: No evidence of thrombus. Normal compressibility, respiratory phasicity and response to augmentation. Saphenofemoral Junction: No evidence of thrombus. Normal compressibility and flow on color Doppler imaging. Profunda  Femoral Vein: No evidence of thrombus. Normal compressibility and flow on color Doppler imaging. Femoral Vein: The femoral vein appears duplicated at its proximal and mid aspects. There is mixed echogenic occlusive thrombus within one of the paired left femoral veins at its proximal (image 20) and mid (image 24) aspects. There is echogenic occlusive thrombus within the distal aspect of the left femoral vein (image 30). Popliteal Vein: There is echogenic occlusive expansile thrombus within the left popliteal vein (image 31), progressed compared to the 02/2015 examination. Calf Veins: Suboptimally evaluated. Superficial Great Saphenous Vein: Appears patent proximally. Other Findings: The lesser saphenous vein appears patent proximally (representative image 32), though there is mixed echogenic occlusive superficial thrombophlebitis within its mid and distal aspects (image 46). IMPRESSION: 1. Age-indeterminate mixed echogenic occlusive expansile thrombus within the left popliteal vein, progressed compared to the 02/2015 examination. In the absence of interval prior examinations, an acute on chronic process is not excluded on the basis of this examination and clinical correlation is advised. No definite evidence of central propagation of suspected acute on chronic DVT. 2. Grossly unchanged mixed occlusive and nonocclusive DVT within partially duplicated left femoral vein, similar to the 02/2015 examination. 3. Suspected chronic occlusive superficial thrombophlebitis within the mid and distal aspects of the lesser saphenous vein. These results will be called to the ordering clinician or representative by the Radiologist Assistant, and communication documented in the PACS or zVision Dashboard. Electronically Signed   By: Simonne Come M.D.   On: 08/25/2015 16:50     ASSESSMENT & PLAN:  Endometrial cancer (HCC) Metastatic endometrial cancer to the bone- currently on Megace. Tolerating it well. Recent CT of the abdomen  and pelvis- improved response. Continue Megace.  # Swelling of the left lower extremity worsening stat Dopplers of the lower  extremity DVT extensive. Recommend starting Eliquis  5 mg twice a day. Spoke to Dr.Budzyn-no contraindications. We'll also check with Dr.[name].  Hold plavix/meloxicam  # Metastatic lesions to the bone on X-geva- HOLD X-geva today.   # pain- on Fentanyl/ percocet  # Follow-up in 4 weeks; CBC CMP. Patient has a PET scan ordered in aug  2017.    Orders Placed This Encounter  Procedures  . US Venous Img Lower Unilateral Left    Standing Status: Future     Number of Occurrences: 1     Standing Expiration Date: 10/24/2016    Order Specific Question:  Reason for Exam (SYMPTOM  OR DIAGNOSIS REQUIRED)    Answer:  leg swelling    Order Specific Question:  Preferred imaging location?    Answer:  [facility]  . CBC with Differential    Standing Status: Future     Number of Occurrences:      Standing Expiration Date: 08/24/2016  . Comprehensive metabolic panel    Standing Status: Future     Number of Occurrences:      Standing Expiration Date: 08/24/2016    Order Specific Question:  Has the patient fasted?    Answer:  No  . CA 125    Standing Status: Future     Number of Occurrences:      Standing Expiration Date: 08/24/2016   All questions were answered. The patient knows to call the clinic with any problems, questions or concerns.      Cammie Sickle, MD 08/25/2015 9:55 PM

## 2015-08-25 NOTE — Patient Instructions (Addendum)
Patient instructed to hold plavix and meloxicam until further notice.     Apixaban oral tablets What is this medicine? APIXABAN (a PIX a ban) is an anticoagulant (blood thinner). It is used to lower the chance of stroke in people with a medical condition called atrial fibrillation. It is also used to treat or prevent blood clots in the lungs or in the veins. This medicine may be used for other purposes; ask your health care provider or pharmacist if you have questions. What should I tell my health care provider before I take this medicine? They need to know if you have any of these conditions: -bleeding disorders -bleeding in the brain -blood in your stools (black or tarry stools) or if you have blood in your vomit -history of stomach bleeding -kidney disease -liver disease -mechanical heart valve -an unusual or allergic reaction to apixaban, other medicines, foods, dyes, or preservatives -pregnant or trying to get pregnant -breast-feeding How should I use this medicine? Take this medicine by mouth with a glass of water. Follow the directions on the prescription label. You can take it with or without food. If it upsets your stomach, take it with food. Take your medicine at regular intervals. Do not take it more often than directed. Do not stop taking except on your doctor's advice. Stopping this medicine may increase your risk of a blot clot. Be sure to refill your prescription before you run out of medicine. Talk to your pediatrician regarding the use of this medicine in children. Special care may be needed. Overdosage: If you think you have taken too much of this medicine contact a poison control center or emergency room at once. NOTE: This medicine is only for you. Do not share this medicine with others. What if I miss a dose? If you miss a dose, take it as soon as you can. If it is almost time for your next dose, take only that dose. Do not take double or extra doses. What may  interact with this medicine? This medicine may interact with the following: -aspirin and aspirin-like medicines -certain medicines for fungal infections like ketoconazole and itraconazole -certain medicines for seizures like carbamazepine and phenytoin -certain medicines that treat or prevent blood clots like warfarin, enoxaparin, and dalteparin -clarithromycin -NSAIDs, medicines for pain and inflammation, like ibuprofen or naproxen -rifampin -ritonavir -St. John's wort This list may not describe all possible interactions. Give your health care provider a list of all the medicines, herbs, non-prescription drugs, or dietary supplements you use. Also tell them if you smoke, drink alcohol, or use illegal drugs. Some items may interact with your medicine. What should I watch for while using this medicine? Notify your doctor or health care professional and seek emergency treatment if you develop breathing problems; changes in vision; chest pain; severe, sudden headache; pain, swelling, warmth in the leg; trouble speaking; sudden numbness or weakness of the face, arm, or leg. These can be signs that your condition has gotten worse. If you are going to have surgery, tell your doctor or health care professional that you are taking this medicine. Tell your health care professional that you use this medicine before you have a spinal or epidural procedure. Sometimes people who take this medicine have bleeding problems around the spine when they have a spinal or epidural procedure. This bleeding is very rare. If you have a spinal or epidural procedure while on this medicine, call your health care professional immediately if you have back pain, numbness or tingling (especially  in your legs and feet), muscle weakness, paralysis, or loss of bladder or bowel control. Avoid sports and activities that might cause injury while you are using this medicine. Severe falls or injuries can cause unseen bleeding. Be careful  when using sharp tools or knives. Consider using an Copy. Take special care brushing or flossing your teeth. Report any injuries, bruising, or red spots on the skin to your doctor or health care professional. What side effects may I notice from receiving this medicine? Side effects that you should report to your doctor or health care professional as soon as possible: -allergic reactions like skin rash, itching or hives, swelling of the face, lips, or tongue -signs and symptoms of bleeding such as bloody or black, tarry stools; red or dark-brown urine; spitting up blood or brown material that looks like coffee grounds; red spots on the skin; unusual bruising or bleeding from the eye, gums, or nose This list may not describe all possible side effects. Call your doctor for medical advice about side effects. You may report side effects to FDA at 1-800-FDA-1088. Where should I keep my medicine? Keep out of the reach of children. Store at room temperature between 20 and 25 degrees C (68 and 77 degrees F). Throw away any unused medicine after the expiration date. NOTE: This sheet is a summary. It may not cover all possible information. If you have questions about this medicine, talk to your doctor, pharmacist, or health care provider.    2016, Elsevier/Gold Standard. (2012-09-27 11:59:24)     Deep Vein Thrombosis A deep vein thrombosis (DVT) is a blood clot (thrombus) that usually occurs in a deep, larger vein of the lower leg or the pelvis, or in an upper extremity such as the arm. These are dangerous and can lead to serious and even life-threatening complications if the clot travels to the lungs. A DVT can damage the valves in your leg veins so that instead of flowing upward, the blood pools in the lower leg. This is called post-thrombotic syndrome, and it can result in pain, swelling, discoloration, and sores on the leg. CAUSES A DVT is caused by the formation of a blood clot in your leg,  pelvis, or arm. Usually, several things contribute to the formation of blood clots. A clot may develop when:  Your blood flow slows down.  Your vein becomes damaged in some way.  You have a condition that makes your blood clot more easily. RISK FACTORS A DVT is more likely to develop in:  People who are older, especially over 44 years of age.  People who are overweight (obese).  People who sit or lie still for a long time, such as during long-distance travel (over 4 hours), bed rest, hospitalization, or during recovery from certain medical conditions like a stroke.  People who do not engage in much physical activity (sedentary lifestyle).  People who have chronic breathing disorders.  People who have a personal or family history of blood clots or blood clotting disease.  People who have peripheral vascular disease (PVD), diabetes, or some types of cancer.  People who have heart disease, especially if the person had a recent heart attack or has congestive heart failure.  People who have neurological diseases that affect the legs (leg paresis).  People who have had a traumatic injury, such as breaking a hip or leg.  People who have recently had major or lengthy surgery, especially on the hip, knee, or abdomen.  People who have had a  central line placed inside a large vein.  People who take medicines that contain the hormone estrogen. These include birth control pills and hormone replacement therapy.  Pregnancy or during childbirth or the postpartum period.  Long plane flights (over 8 hours). SIGNS AND SYMPTOMS Symptoms of a DVT can include:   Swelling of your leg or arm, especially if one side is much worse.  Warmth and redness of your leg or arm, especially if one side is much worse.  Pain in your arm or leg. If the clot is in your leg, symptoms may be more noticeable or worse when you stand or walk.  A feeling of pins and needles, if the clot is in the arm. The  symptoms of a DVT that has traveled to the lungs (pulmonary embolism, PE) usually start suddenly and include:  Shortness of breath while active or at rest.  Coughing or coughing up blood or blood-tinged mucus.  Chest pain that is often worse with deep breaths.  Rapid or irregular heartbeat.  Feeling light-headed or dizzy.  Fainting.  Feeling anxious.  Sweating. There may also be pain and swelling in a leg if that is where the blood clot started. These symptoms may represent a serious problem that is an emergency. Do not wait to see if the symptoms will go away. Get medical help right away. Call your local emergency services (911 in the [place] Do not drive yourself to the hospital. DIAGNOSIS Your health care provider will take a medical history and perform a physical exam. You may also have other tests, including:  Blood tests to assess the clotting properties of your blood.  Imaging tests, such as CT, ultrasound, MRI, X-ray, and other tests to see if you have clots anywhere in your body. TREATMENT After a DVT is identified, it can be treated. The type of treatment that you receive depends on many factors, such as the cause of your DVT, your risk for bleeding or developing more clots, and other medical conditions that you have. Sometimes, a combination of treatments is necessary. Treatment options may be combined and include:  Monitoring the blood clot with ultrasound.  Taking medicines by mouth, such as newer blood thinners (anticoagulants), thrombolytics, or warfarin.  Taking anticoagulant medicine by injection or through an IV tube.  Wearing compression stockings or using different types ofdevices.  Surgery (rare) to remove the blood clot or to place a filter in your abdomen to stop the blood clot from traveling to your lungs. Treatments for a DVT are often divided into immediate treatment and long-term treatment (up to 3 months after DVT). You can work with your health care  provider to choose the treatment program that is best for you. HOME CARE INSTRUCTIONS If you are taking a newer oral anticoagulant:  Take the medicine every single day at the same time each day.  Understand what foods and drugs interact with this medicine.  Understand that there are no regular blood tests required when using this medicine.  Understand the side effects of this medicine, including excessive bruising or bleeding. Ask your health care provider or pharmacist about other possible side effects. If you are taking warfarin:  Understand how to take warfarin and know which foods can affect how warfarin works in Veterinary surgeon.  Understand that it is dangerous to take too much or too little warfarin. Too much warfarin increases the risk of bleeding. Too little warfarin continues to allow the risk for blood clots.  Follow your PT and INR  blood testing schedule. The PT and INR results allow your health care provider to adjust your dose of warfarin. It is very important that you have your PT and INR tested as often as told by your health care provider.  Avoid major changes in your diet, or tell your health care provider before you change your diet. Arrange a visit with a registered dietitian to answer your questions. Many foods, especially foods that are high in vitamin K, can interfere with warfarin and affect the PT and INR results. Eat a consistent amount of foods that are high in vitamin K, such as:  Spinach, kale, broccoli, cabbage, collard greens, turnip greens, Brussels sprouts, peas, cauliflower, seaweed, and parsley.  Beef liver and pork liver.  Green tea.  Soybean oil.  Tell your health care provider about any and all medicines, vitamins, and supplements that you take, including aspirin and other over-the-counter anti-inflammatory medicines. Be especially cautious with aspirin and anti-inflammatory medicines. Do not take those before you ask your health care provider if it is safe  to do so. This is important because many medicines can interfere with warfarin and affect the PT and INR results.  Do not start or stop taking any over-the-counter or prescription medicine unless your health care provider or pharmacist tells you to do so. If you take warfarin, you will also need to do these things:  Hold pressure over cuts for longer than usual.  Tell your dentist and other health care providers that you are taking warfarin before you have any procedures in which bleeding may occur.  Avoid alcohol or drink very small amounts. Tell your health care provider if you change your alcohol intake.  Do not use tobacco products, including cigarettes, chewing tobacco, and e-cigarettes. If you need help quitting, ask your health care provider.  Avoid contact sports. General Instructions  Take over-the-counter and prescription medicines only as told by your health care provider. Anticoagulant medicines can have side effects, including easy bruising and difficulty stopping bleeding. If you are prescribed an anticoagulant, you will also need to do these things:  Hold pressure over cuts for longer than usual.  Tell your dentist and other health care providers that you are taking anticoagulants before you have any procedures in which bleeding may occur.  Avoid contact sports.  Wear a medical alert bracelet or carry a medical alert card that says you have had a PE.  Ask your health care provider how soon you can go back to your normal activities. Stay active to prevent new blood clots from forming.  Make sure to exercise while traveling or when you have been sitting or standing for a long period of time. It is very important to exercise. Exercise your legs by walking or by tightening and relaxing your leg muscles often. Take frequent walks.  Wear compression stockings as told by your health care provider to help prevent more blood clots from forming.  Do not use tobacco products,  including cigarettes, chewing tobacco, and e-cigarettes. If you need help quitting, ask your health care provider.  Keep all follow-up appointments with your health care provider. This is important. PREVENTION Take these actions to decrease your risk of developing another DVT:  Exercise regularly. For at least 30 minutes every day, engage in:  Activity that involves moving your arms and legs.  Activity that encourages good blood flow through your body by increasing your heart rate.  Exercise your arms and legs every hour during long-distance travel (over 4 hours). Drink  plenty of water and avoid drinking alcohol while traveling.  Avoid sitting or lying in bed for long periods of time without moving your legs.  Maintain a weight that is appropriate for your height. Ask your health care provider what weight is healthy for you.  If you are a woman who is over 53 years of age, avoid unnecessary use of medicines that contain estrogen. These include birth control pills.  Do not smoke, especially if you take estrogen medicines. If you need help quitting, ask your health care provider. If you are hospitalized, prevention measures may include:  Early walking after surgery, as soon as your health care provider says that it is safe.  Receiving anticoagulants to prevent blood clots.If you cannot take anticoagulants, other options may be available, such as wearing compression stockings or using different types of devices. SEEK IMMEDIATE MEDICAL CARE IF:  You have new or increased pain, swelling, or redness in an arm or leg.  You have numbness or tingling in an arm or leg.  You have shortness of breath while active or at rest.  You have chest pain.  You have a rapid or irregular heartbeat.  You feel light-headed or dizzy.  You cough up blood.  You notice blood in your vomit, bowel movement, or urine. These symptoms may represent a serious problem that is an emergency. Do not wait to see  if the symptoms will go away. Get medical help right away. Call your local emergency services (911 in the [place] Do not drive yourself to the hospital.   This information is not intended to replace advice given to you by your health care provider. Make sure you discuss any questions you have with your health care provider.   Document Released: 01/23/2005 Document Revised: 10/14/2014 Document Reviewed: 05/20/2014 Elsevier Interactive Patient Education [employer].

## 2015-08-26 ENCOUNTER — Telehealth: Payer: Self-pay | Admitting: *Deleted

## 2015-08-26 ENCOUNTER — Telehealth: Payer: Self-pay | Admitting: Radiology

## 2015-08-26 NOTE — Telephone Encounter (Signed)
Notified pt of surgery scheduled 09/22/15, pre-admit phone interview on 09/09/15 between 9am-1pm & to call day prior to surgery for arrival time to [facility]. Pt states she recently had a DVT & that Plavix & mobic were d/c'd and replaced by eliquis by Dr Rogue Bussing. Will discuss holding Eliquis with Dr Rogue Bussing & call pt back with further instructions. Pt voices understanding.

## 2015-08-26 NOTE — Telephone Encounter (Signed)
Notified pt that per Dr Pilar Jarvis, he discussed DVT and changes in medications with Dr Rogue Bussing. Per Dr Pilar Jarvis advised pt to continue Eliquis as prescribed including day of surgery. Pt voices understanding.

## 2015-08-26 NOTE — Telephone Encounter (Signed)
Called to report that her urine is brownish red this morning, I spoke with Dr Rogue Bussing who said that she needs to increase her oral liquid intake. Patient advised of this and to call back if it becomes bright red, she repeated this back to me

## 2015-08-30 ENCOUNTER — Other Ambulatory Visit: Payer: Self-pay | Admitting: *Deleted

## 2015-08-30 DIAGNOSIS — C541 Malignant neoplasm of endometrium: Secondary | ICD-10-CM

## 2015-08-30 DIAGNOSIS — E889 Metabolic disorder, unspecified: Secondary | ICD-10-CM

## 2015-08-30 DIAGNOSIS — M9080 Osteopathy in diseases classified elsewhere, unspecified site: Secondary | ICD-10-CM

## 2015-08-30 DIAGNOSIS — M898X9 Other specified disorders of bone, unspecified site: Secondary | ICD-10-CM

## 2015-08-30 DIAGNOSIS — C50912 Malignant neoplasm of unspecified site of left female breast: Secondary | ICD-10-CM

## 2015-08-30 MED ORDER — OXYCODONE-ACETAMINOPHEN 5-325 MG PO TABS: 1.0000 | ORAL_TABLET | ORAL | 0 refills | Status: DC | PRN

## 2015-08-31 ENCOUNTER — Telehealth: Payer: Self-pay | Admitting: Internal Medicine

## 2015-08-31 NOTE — Telephone Encounter (Signed)
Spoke to Dr.[name] re: DVT agrees with the plan to anti-coagulate. No recs for thrombectomy at this time. GB

## 2015-09-01 LAB — SURGICAL PATHOLOGY

## 2015-09-08 ENCOUNTER — Ambulatory Visit (INDEPENDENT_AMBULATORY_CARE_PROVIDER_SITE_OTHER): Payer: Medicare Other | Admitting: Family Medicine

## 2015-09-08 ENCOUNTER — Encounter
Admission: RE | Admit: 2015-09-08 | Discharge: 2015-09-08 | Disposition: A | Discharge status: Home / Self Care | Payer: Medicare Other | Source: Ambulatory Visit | Attending: Urology | Admitting: Urology

## 2015-09-08 VITALS — BP 130/77 | HR 69

## 2015-09-08 DIAGNOSIS — Z01818 Encounter for other preprocedural examination: Secondary | ICD-10-CM | POA: Insufficient documentation

## 2015-09-08 DIAGNOSIS — N131 Hydronephrosis with ureteral stricture, not elsewhere classified: Secondary | ICD-10-CM | POA: Diagnosis not present

## 2015-09-08 LAB — URINALYSIS, COMPLETE
Bilirubin, UA: NEGATIVE
Glucose, UA: NEGATIVE
Ketones, UA: NEGATIVE
Nitrite, UA: NEGATIVE
PH UA: 6 (ref 5.0–7.5)
Specific Gravity, UA: 1.02 (ref 1.005–1.030)
UUROB: 0.2 mg/dL (ref 0.2–1.0)

## 2015-09-08 LAB — MICROSCOPIC EXAMINATION: RBC, UA: >30 /hpf — AB (ref 0–?)

## 2015-09-08 NOTE — Progress Notes (Signed)
In and Out Catheterization  Patient is present today for a I & O catheterization due to culture for up coming surgery. Patient was cleaned and prepped in a sterile fashion with betadine and Lidocaine 2% jelly was instilled into the urethra.  A 14FR cath was inserted no complications were noted , 62ml of urine return was noted, urine was yellow in color. A clean urine sample was collected for culture. Bladder was drained  And catheter was removed with out difficulty.    Preformed by: Elberta Leatherwood, CMA

## 2015-09-08 NOTE — Patient Instructions (Signed)
Your procedure is scheduled on: Wednesday 09/15/15 Report to Day Surgery. [facility] FLOOR MEDICAL MALL ENTRANCE To find out your arrival time please call [phone] between 1PM - 3PM on Tuesday 09/14/15.  Remember: Instructions that are not followed completely may result in serious medical risk, up to and including death, or upon the discretion of your surgeon and anesthesiologist your surgery may need to be rescheduled.    __X__ 1. Do not eat food or drink liquids after midnight. No gum chewing or hard candies.     __X__ 2. No Alcohol for 24 hours before or after surgery.   ____ 3. Bring all medications with you on the day of surgery if instructed.    __X__ 4. Notify your doctor if there is any change in your medical condition     (cold, fever, infections).     Do not wear jewelry, make-up, hairpins, clips or nail polish.  Do not wear lotions, powders, or perfumes.   Do not shave 48 hours prior to surgery. Men may shave face and neck.  Do not bring valuables to the hospital.    [facility] is not responsible for any belongings or valuables.               Contacts, dentures or bridgework may not be worn into surgery.  Leave your suitcase in the car. After surgery it may be brought to your room.  For patients admitted to the hospital, discharge time is determined by your                treatment team.   Patients discharged the day of surgery will not be allowed to drive home.   Please read over the following fact sheets that you were given:   Surgical Site Infection Prevention   __X__ Take these medicines the morning of surgery with A SIP OF WATER:    1. GABAPENTIN  2.   3.   4.  5.  6.  ____ Fleet Enema (as directed)   ____ Use CHG Soap as directed  ____ Use inhalers on the day of surgery  ____ Stop metformin 2 days prior to surgery    ____ Take 1/2 of usual insulin dose the night before surgery and none on the morning of surgery.   ____ Stop Coumadin/Plavix/aspirin on    __X__ Stop Anti-inflammatories on HOLD MELOXICAM UNTIL AFTER SURGERY   ____ Stop supplements until after surgery.    ____ Bring C-Pap to the hospital.

## 2015-09-08 NOTE — Addendum Note (Signed)
Addended by: Kyra Manges on: 09/08/2015 11:19 AM   Modules accepted: Orders

## 2015-09-09 ENCOUNTER — Other Ambulatory Visit: Payer: Medicare Other

## 2015-09-09 LAB — URINE CULTURE

## 2015-09-10 ENCOUNTER — Ambulatory Visit: Payer: Medicare Other

## 2015-09-13 ENCOUNTER — Other Ambulatory Visit: Payer: Self-pay | Admitting: *Deleted

## 2015-09-13 DIAGNOSIS — C541 Malignant neoplasm of endometrium: Secondary | ICD-10-CM

## 2015-09-13 MED ORDER — FENTANYL 50 MCG/HR TD PT72: 50.0000 ug | MEDICATED_PATCH | TRANSDERMAL | 0 refills | Status: DC

## 2015-09-15 ENCOUNTER — Ambulatory Visit: Payer: Medicare Other | Admitting: Anesthesiology

## 2015-09-15 ENCOUNTER — Telehealth: Payer: Self-pay

## 2015-09-15 ENCOUNTER — Ambulatory Visit
Admission: RE | Admit: 2015-09-15 | Discharge: 2015-09-15 | Disposition: A | Discharge status: Home / Self Care | Payer: Medicare Other | Source: Ambulatory Visit | Attending: Urology | Admitting: Urology

## 2015-09-15 ENCOUNTER — Encounter
Admission: RE | Disposition: A | Discharge status: Home / Self Care | Payer: Self-pay | Source: Ambulatory Visit | Attending: Urology

## 2015-09-15 DIAGNOSIS — M1990 Unspecified osteoarthritis, unspecified site: Secondary | ICD-10-CM | POA: Insufficient documentation

## 2015-09-15 DIAGNOSIS — Z853 Personal history of malignant neoplasm of breast: Secondary | ICD-10-CM | POA: Insufficient documentation

## 2015-09-15 DIAGNOSIS — G709 Myoneural disorder, unspecified: Secondary | ICD-10-CM | POA: Diagnosis not present

## 2015-09-15 DIAGNOSIS — Z880 Allergy status to penicillin: Secondary | ICD-10-CM | POA: Diagnosis not present

## 2015-09-15 DIAGNOSIS — Z86711 Personal history of pulmonary embolism: Secondary | ICD-10-CM | POA: Diagnosis not present

## 2015-09-15 DIAGNOSIS — K219 Gastro-esophageal reflux disease without esophagitis: Secondary | ICD-10-CM | POA: Insufficient documentation

## 2015-09-15 DIAGNOSIS — N1339 Other hydronephrosis: Secondary | ICD-10-CM | POA: Diagnosis not present

## 2015-09-15 DIAGNOSIS — Z86010 Personal history of colonic polyps: Secondary | ICD-10-CM | POA: Insufficient documentation

## 2015-09-15 DIAGNOSIS — I739 Peripheral vascular disease, unspecified: Secondary | ICD-10-CM | POA: Diagnosis not present

## 2015-09-15 DIAGNOSIS — N1330 Unspecified hydronephrosis: Secondary | ICD-10-CM | POA: Diagnosis not present

## 2015-09-15 DIAGNOSIS — E039 Hypothyroidism, unspecified: Secondary | ICD-10-CM | POA: Diagnosis not present

## 2015-09-15 DIAGNOSIS — Z79899 Other long term (current) drug therapy: Secondary | ICD-10-CM | POA: Diagnosis not present

## 2015-09-15 DIAGNOSIS — I69320 Aphasia following cerebral infarction: Secondary | ICD-10-CM | POA: Diagnosis not present

## 2015-09-15 DIAGNOSIS — Z86718 Personal history of other venous thrombosis and embolism: Secondary | ICD-10-CM | POA: Insufficient documentation

## 2015-09-15 DIAGNOSIS — Z8719 Personal history of other diseases of the digestive system: Secondary | ICD-10-CM | POA: Insufficient documentation

## 2015-09-15 DIAGNOSIS — Z8542 Personal history of malignant neoplasm of other parts of uterus: Secondary | ICD-10-CM | POA: Diagnosis not present

## 2015-09-15 HISTORY — PX: CYSTOSCOPY W/ RETROGRADES: SHX1426

## 2015-09-15 HISTORY — PX: CYSTOSCOPY W/ URETERAL STENT PLACEMENT: SHX1429

## 2015-09-15 SURGERY — CYSTOSCOPY, WITH RETROGRADE PYELOGRAM
Anesthesia: General | Laterality: Right | Wound class: Clean Contaminated

## 2015-09-15 MED ORDER — CIPROFLOXACIN HCL 500 MG PO TABS: 500.0000 mg | ORAL_TABLET | Freq: Two times a day (BID) | ORAL | 0 refills | Status: DC

## 2015-09-15 MED ORDER — FENTANYL CITRATE (PF) 100 MCG/2ML IJ SOLN: 25.0000 ug | INTRAMUSCULAR | Status: DC | PRN

## 2015-09-15 MED ORDER — FAMOTIDINE 20 MG PO TABS
20.0000 mg | ORAL_TABLET | Freq: Once | ORAL | Status: AC
  Administered 2015-09-15: 20 mg via ORAL

## 2015-09-15 MED ORDER — ONDANSETRON HCL 4 MG/2ML IJ SOLN
INTRAMUSCULAR | Status: DC | PRN
  Administered 2015-09-15: 4 mg via INTRAVENOUS

## 2015-09-15 MED ORDER — FAMOTIDINE 20 MG PO TABS
ORAL_TABLET | ORAL | Status: AC
  Administered 2015-09-15: 20 mg via ORAL
  Filled 2015-09-15: qty 1

## 2015-09-15 MED ORDER — PROPOFOL 10 MG/ML IV BOLUS
INTRAVENOUS | Status: DC | PRN
Start: 2015-09-15 — End: 2015-09-15
  Administered 2015-09-15: 100 mg via INTRAVENOUS

## 2015-09-15 MED ORDER — FENTANYL CITRATE (PF) 100 MCG/2ML IJ SOLN
INTRAMUSCULAR | Status: DC | PRN
  Administered 2015-09-15: 25 ug via INTRAVENOUS

## 2015-09-15 MED ORDER — ONDANSETRON HCL 4 MG/2ML IJ SOLN
4.0000 mg | Freq: Once | INTRAMUSCULAR | Status: DC | PRN
Start: 2015-09-15 — End: 2015-09-15

## 2015-09-15 MED ORDER — LIDOCAINE HCL (CARDIAC) 20 MG/ML IV SOLN
INTRAVENOUS | Status: DC | PRN
  Administered 2015-09-15: 40 mg via INTRAVENOUS

## 2015-09-15 MED ORDER — CIPROFLOXACIN IN D5W 400 MG/200ML IV SOLN
INTRAVENOUS | Status: AC
  Administered 2015-09-15: 400 mg via INTRAVENOUS
  Filled 2015-09-15: qty 200

## 2015-09-15 MED ORDER — CIPROFLOXACIN IN D5W 400 MG/200ML IV SOLN
400.0000 mg | Freq: Once | INTRAVENOUS | Status: AC
  Administered 2015-09-15: 400 mg via INTRAVENOUS

## 2015-09-15 MED ORDER — GLYCOPYRROLATE 0.2 MG/ML IJ SOLN
INTRAMUSCULAR | Status: DC | PRN
  Administered 2015-09-15: 0.2 mg via INTRAVENOUS

## 2015-09-15 MED ORDER — DEXAMETHASONE SODIUM PHOSPHATE 10 MG/ML IJ SOLN
INTRAMUSCULAR | Status: DC | PRN
  Administered 2015-09-15: 4 mg via INTRAVENOUS

## 2015-09-15 MED ORDER — LACTATED RINGERS IV SOLN
INTRAVENOUS | Status: DC
  Administered 2015-09-15: 10:00:00 via INTRAVENOUS

## 2015-09-15 SURGICAL SUPPLY — 22 items
BACTOSHIELD CHG 4% 4OZ (MISCELLANEOUS) ×2
CATH URETL 5X70 OPEN END (CATHETERS) ×3 IMPLANT
CONRAY 43 FOR UROLOGY 50M (MISCELLANEOUS) ×3 IMPLANT
GLOVE BIO SURGEON STRL SZ7 (GLOVE) ×6 IMPLANT
GLOVE BIO SURGEON STRL SZ7.5 (GLOVE) ×3 IMPLANT
GOWN STRL REUS W/ TWL LRG LVL3 (GOWN DISPOSABLE) ×1 IMPLANT
GOWN STRL REUS W/ TWL LRG LVL4 (GOWN DISPOSABLE) ×1 IMPLANT
GOWN STRL REUS W/TWL LRG LVL3 (GOWN DISPOSABLE) ×2
GOWN STRL REUS W/TWL LRG LVL4 (GOWN DISPOSABLE) ×2
GOWN STRL REUS W/TWL XL LVL3 (GOWN DISPOSABLE) ×3 IMPLANT
KIT RM TURNOVER CYSTO AR (KITS) ×3 IMPLANT
PACK CYSTO AR (MISCELLANEOUS) ×3 IMPLANT
SCRUB CHG 4% DYNA-HEX 4OZ (MISCELLANEOUS) ×1 IMPLANT
SENSORWIRE 0.038 NOT ANGLED (WIRE) ×3
SET CYSTO W/LG BORE CLAMP LF (SET/KITS/TRAYS/PACK) ×3 IMPLANT
SOL .9 NS 3000ML IRR  AL (IV SOLUTION) ×2
SOL .9 NS 3000ML IRR UROMATIC (IV SOLUTION) ×1 IMPLANT
STENT URET 6FRX24 CONTOUR (STENTS) ×3 IMPLANT
STENT URET 6FRX26 CONTOUR (STENTS) IMPLANT
SURGILUBE 2OZ TUBE FLIPTOP (MISCELLANEOUS) ×3 IMPLANT
WATER STERILE IRR 1000ML POUR (IV SOLUTION) ×3 IMPLANT
WIRE SENSOR 0.038 NOT ANGLED (WIRE) ×1 IMPLANT

## 2015-09-15 NOTE — OR Nursing (Signed)
Clarified order for SCD's prior to placement with Dr Pilar Jarvis. Order received to discontinue.

## 2015-09-15 NOTE — Telephone Encounter (Signed)
-----   Message from [name], MD sent at 09/15/2015 11:41 AM EDT ----- Patient needs to see me in 3 months with possible cysto/stent removal at that time

## 2015-09-15 NOTE — Anesthesia Preprocedure Evaluation (Addendum)
Anesthesia Evaluation  Patient identified by MRN, date of birth, ID band Patient awake    Reviewed: Allergy & Precautions, NPO status , Patient's Chart, lab work & pertinent test results, reviewed documented beta blocker date and time   Airway Mallampati: II  TM Distance: >3 FB     Dental  (+) Upper Dentures, Lower Dentures   Pulmonary           Cardiovascular + angina + Peripheral Vascular Disease       Neuro/Psych Well Controlled,   Neuromuscular disease CVA, Residual Symptoms    GI/Hepatic PUD, GERD  Controlled,  Endo/Other  Hypothyroidism   Renal/GU Renal InsufficiencyRenal disease     Musculoskeletal  (+) Arthritis ,   Abdominal   Peds  Hematology   Anesthesia Other Findings Denies seizures or sleep apnea. Decreased hearing. Has aphasia as a residual. Lumbar laminectomy. L br ca.  Reproductive/Obstetrics                           Anesthesia Physical Anesthesia Plan  ASA: III  Anesthesia Plan: General   Post-op Pain Management:    Induction: Intravenous  Airway Management Planned: LMA  Additional Equipment:   Intra-op Plan:   Post-operative Plan:   Informed Consent: I have reviewed the patients History and Physical, chart, labs and discussed the procedure including the risks, benefits and alternatives for the proposed anesthesia with the patient or authorized representative who has indicated his/her understanding and acceptance.     Plan Discussed with: CRNA  Anesthesia Plan Comments:         Anesthesia Quick Evaluation

## 2015-09-15 NOTE — H&P (Addendum)
Traci [name] Nov 02, 1929 MD:[id number]  Referring provider: No referring provider defined for this encounter.  No chief complaint on file.   HPI: 82 y.o. Female with right hydronephrosis 2/2 endometrial ca presents for right ureteral stent exchange.   PMH:      Past Medical History  Diagnosis Date  . History of esophageal stricture   . GERD (gastroesophageal reflux disease)   . Inguinal lymphocyst   . History of brachytherapy   . History of shingles   . History of colon polyps   . Mild aphasia   . Hearing loss   . History of DVT (deep vein thrombosis)   . Hiccups   . Pain     SEVERE CHRONIC RIGHT LEG,HIP,FOOT  . Arthritis     Degenerative Disc Disease  . Gastric ulcer   . Deep vein blood clot of left lower extremity ([place]) February 11, 2015    Left Leg  . Stroke ([place]) 04/06/2011    residual:  aphasia  . Chronic kidney disease     HYDRONEPHROSIS  . Endometrial adenocarcinoma ([place]) 01/2012     stage Ib, grade 1, positive peritoneal cytology, no other risk factors or extra-uterine disease  . Cancer of left breast ([place]) 10/20/2014  . Bone cancer ([place])   . PE (pulmonary embolism)     H/O    Surgical History:       Past Surgical History  Procedure Laterality Date  . Esophageal dilation    . Thyroidectomy, partial    . Cataract extraction w/ intraocular lens implant      right  . Back surgery      fusion of L3&4  . Tonsillectomy    . Total abdominal hysterectomy w/ bilateral salpingoophorectomy Bilateral     staging biopsies  . Cystoscopy w/ retrogrades Right 10/28/2014    Procedure: CYSTOSCOPY WITH RETROGRADE PYELOGRAM ATTEMPT, UNABLE TO PASS ;  Surgeon: [name], MD;  Location: [facility];  Service: Urology;  Laterality: Right;  . Cystoscopy with stent placement Right 10/28/2014    Procedure: BLADDER BIOPSY WITH FULGERATION ;  Surgeon: [name], MD;  Location: [facility];  Service:  Urology;  Laterality: Right;  . Thyroid removed    . Eye surgery Bilateral     Cataract Extraction  . Portacath placement Right 11/26/2014    Procedure: INSERTION PORT-A-CATH;  Surgeon: [name], MD;  Location: [facility];  Service: General;  Laterality: Right;  . Peripheral vascular catheterization N/A 02/11/2015    Procedure: IVC Filter Insertion;  Surgeon: Algernon Huxley, MD;  Location: [facility];  Service: Cardiovascular;  Laterality: N/A;  . Abdominal hysterectomy  December 2013    history of cervical cancer  . Cystoscopy w/ ureteral stent placement Right 03/10/2015    Procedure: CYSTOSCOPY WITH STENT REPLACEMENT;  Surgeon: [name], MD;  Location: [facility];  Service: Urology;  Laterality: Right;  . Partial mastectomy with needle localization Left 03/30/2015    Procedure: PARTIAL MASTECTOMY WITH NEEDLE LOCALIZATION;  Surgeon: [name], MD;  Location: [facility];  Service: General;  Laterality: Left;  . Sentinel node biopsy Left 03/30/2015    Procedure: SENTINEL NODE BIOPSY;  Surgeon: [name], MD;  Location: [facility];  Service: General;  Laterality: Left;  . Breast cyst aspiration N/A   . Breast biopsy Left 10/13/2014    path pending    Home Medications:    Medication List    ASK your doctor about  these medications        clopidogrel 75 MG tablet  Commonly known as:  PLAVIX  Take 75 mg by mouth daily.     fentaNYL 50 MCG/HR  Commonly known as:  DURAGESIC - dosed mcg/hr  Place 1 patch (50 mcg total) onto the skin every 3 (three) days.     gabapentin 300 MG capsule  Commonly known as:  NEURONTIN  Take 1 capsule (300 mg total) by mouth 3 (three) times daily.     megestrol 40 MG tablet  Commonly known as:  MEGACE  Take 1 tablet (40 mg total) by mouth 4 (four) times daily.     meloxicam 15 MG tablet  Commonly known as:  MOBIC  Take 1 tablet (15 mg total) by mouth daily.     multivitamin with  minerals tablet  Take 1 tablet by mouth daily.     oxyCODONE-acetaminophen 5-325 MG tablet  Commonly known as:  PERCOCET/ROXICET  Take 1-2 tablets by mouth every 4 (four) hours as needed for severe pain.        Allergies:       Allergies  Allergen Reactions  . Penicillins Rash  . Hydrocodone-Acetaminophen Nausea Only  . Novocain [Procaine] Other (See Comments) and Nausea And Vomiting    Chest pain  . Sulfa Antibiotics Other (See Comments)    CHEST PAIN    Family History:       Family History  Problem Relation Age of Onset  . Stroke Mother   . Breast cancer Maternal Aunt     x 2    Social History:  reports that she has never smoked. She has never used smokeless tobacco. She reports that she does not drink alcohol or use illicit drugs.  ROS:                                        Physical Exam: BP 133/67 mmHg  Pulse 66  Temp(Src) 98.3 F (36.8 C) (Tympanic)  Resp 18  Ht 5\' 4"  (1.626 m)  Wt 123 lb (55.792 kg)  BMI 21.10 kg/m2  SpO2 99%  Constitutional:  Alert and oriented, No acute distress. HEENT: B and E AT, moist mucus membranes.  Trachea midline, no masses. Cardiovascular: No clubbing, cyanosis, or edema. RRR Respiratory: Normal respiratory effort, no increased work of breathing. GI: Abdomen is soft, nontender, nondistended, no abdominal masses GU: No CVA tenderness.  Skin: No rashes, bruises or suspicious lesions. Lymph: No cervical or inguinal adenopathy. Neurologic: Grossly intact, no focal deficits, moving all 4 extremities. Psychiatric: Normal mood and affect.  Laboratory Data: RecentLabs       Lab Results  Component Value Date   WBC 3.6 06/01/2015   HGB 14.1 06/01/2015   HCT 40.8 06/01/2015   MCV 94.6 06/01/2015   PLT 246 06/01/2015      RecentLabs       Lab Results  Component Value Date   CREATININE 1.20* 06/01/2015      RecentLabs  No results found for: PSA     RecentLabs  No results found for: TESTOSTERONE    RecentLabs       Lab Results  Component Value Date   HGBA1C 5.5 04/08/2011      Urinalysis Labs(Brief)          Component Value Date/Time   COLORURINE RED* 03/19/2015 1549   COLORURINE Yellow 03/29/2012 1930   APPEARANCEUR Cloudy* 05/31/2015  1143   APPEARANCEUR CLOUDY* 03/19/2015 [place] 03/29/2012 1930   LABSPEC 1.025 03/19/2015 1549   LABSPEC 1.023 03/29/2012 1930   PHURINE 5.0 03/19/2015 1549   PHURINE 7.0 03/29/2012 1930   GLUCOSEU Negative 05/31/2015 1143   GLUCOSEU 50 mg/dL 03/29/2012 1930   HGBUR 3+* 03/19/2015 1549   HGBUR Negative 03/29/2012 1930   BILIRUBINUR Negative 05/31/2015 1143   BILIRUBINUR NEGATIVE 03/19/2015 1549   BILIRUBINUR Negative 03/29/2012 1930   KETONESUR TRACE* 03/19/2015 1549   KETONESUR 1+ 03/29/2012 1930   PROTEINUR 3+* 05/31/2015 1143   PROTEINUR 100* 03/19/2015 1549   PROTEINUR Negative 03/29/2012 1930   NITRITE Negative 05/31/2015 1143   NITRITE NEGATIVE 03/19/2015 1549   NITRITE Negative 03/29/2012 1930   LEUKOCYTESUR 1+* 05/31/2015 1143   LEUKOCYTESUR 1+* 03/19/2015 1549   LEUKOCYTESUR Negative 03/29/2012 1930       Assessment & Plan:   1. Malignant right hydronephrosis 2. Endometrial carcinoma -cystoscopy, right ureteral stent exchange  [name], MD  [facility] [address], [place], [place] [address] [phone]  Addendum: Lungs clear

## 2015-09-15 NOTE — Anesthesia Postprocedure Evaluation (Signed)
Anesthesia Post Note  Patient: Traci [name]  Procedure(s) Performed: Procedure(s) (LRB): CYSTOSCOPY WITH RETROGRADE PYELOGRAM (Right) CYSTOSCOPY WITH STENT REPLACEMENT (Right)  Patient location during evaluation: PACU Anesthesia Type: General Level of consciousness: awake and alert Pain management: pain level controlled Vital Signs Assessment: post-procedure vital signs reviewed and stable Respiratory status: spontaneous breathing, nonlabored ventilation, respiratory function stable and patient connected to nasal cannula oxygen Cardiovascular status: blood pressure returned to baseline and stable Postop Assessment: no signs of nausea or vomiting Anesthetic complications: no    Last Vitals:  Vitals:   09/15/15 1226 09/15/15 1251  BP: (!) 148/56 (!) 129/50  Pulse: 73 64  Resp: 20 18  Temp: 36.3 C     Last Pain:  Vitals:   09/15/15 1251  TempSrc:   PainSc: 0-No pain                 Casson Catena S

## 2015-09-15 NOTE — OR Nursing (Signed)
Dr Marcello Moores notified that patient has on Fentanyl patch and has continued on Eliquis, no new orders at this time.

## 2015-09-15 NOTE — Op Note (Signed)
Date of procedure: 09/15/15  Preoperative diagnosis:  1. Malignant right hydronephrosis  Postoperative diagnosis:  1. Malignant right hydronephrosis  Procedure: 1. Cystoscopy 2. Right ureteral stent exchange 6 [place] by 24 cm  Surgeon: Baruch Gouty, MD  Anesthesia: General  Complications: None  Intraoperative findings: The patient successfully underwent a right ureteral stent exchange.  EBL: None  Specimens: None  Drains: 6 French by 2470 right double-J ureteral stent  Disposition: Stable to the postanesthesia care unit  Indication for procedure: The patient is a 82 y.o. female with endometrial cancer and right hydroureteronephrosis managed with a right ureteral stent who presents today for stent exchange.  After reviewing the management options for treatment, the patient elected to proceed with the above surgical procedure(s). We have discussed the potential benefits and risks of the procedure, side effects of the proposed treatment, the likelihood of the patient achieving the goals of the procedure, and any potential problems that might occur during the procedure or recuperation. Informed consent has been obtained.  Description of procedure: The patient was met in the preoperative area. All risks, benefits, and indications of the procedure were described in great detail. The patient consented to the procedure. Preoperative antibiotics were given. The patient was taken to the operative theater. General anesthesia was induced per the anesthesia service. The patient was then placed in the dorsal lithotomy position and prepped and draped in the usual sterile fashion. A preoperative timeout was called.    A 21 French 30 cystoscope was inserted into the patient's bladder per urethra. The right ureteral stent was grasped flexible graspers and brought to level urethral meatus. A sensor wire was exchanged for this stent to the level of the renal pelvis under fluoroscopy. A 6 French by 24  cm double-J ureteral stent was then placed and sensor wire removed. His confirmed to be in the correct position the curl seen in the patient's renal pelvis on fluoroscopy and the urinary bladder direct visualization. The patient bladder was then drained and she was woken from anesthesia and transferred stable condition to postanesthesia care unit.  Plan: The patient will follow-up in the office in approximately 3 months. We will consider removal of her stent at that time. Right kidney does not appear to be contributing a large amount to her renal function based on size. We did not want to remove the stent today as she is currently on eliquies for acute DVT and the original stent had to be place in an antegrade fashion by interventional radiology due to the high-grade obstruction. The concern today was that if she becomes symptomatic after stent removal that her anticoagulation will delay the ability to replace a stent if it needs to be placed in an antegrade fashion.  Baruch Gouty, M.D.

## 2015-09-15 NOTE — OR Nursing (Signed)
Patient has a hx of DVT in left leg (JULY) no redness or swelling noted in the leg today, patient denies pain in leg and cap refill is less than 3 secs in left foot and is warm to touch.

## 2015-09-15 NOTE — Discharge Instructions (Signed)

## 2015-09-15 NOTE — Transfer of Care (Signed)
Immediate Anesthesia Transfer of Care Note  Patient: Traci [name]  Procedure(s) Performed: Procedure(s): CYSTOSCOPY WITH RETROGRADE PYELOGRAM (Right) CYSTOSCOPY WITH STENT REPLACEMENT (Right)  Patient Location: PACU  Anesthesia Type:General  Level of Consciousness: sedated and responds to stimulation  Airway & Oxygen Therapy: Patient Spontanous Breathing and Patient connected to face mask oxygen  Post-op Assessment: Report given to RN and Post -op Vital signs reviewed and stable  Post vital signs: Reviewed and stable  Last Vitals:  Vitals:   09/15/15 1138 09/15/15 1139  BP: (P) 120/67 120/67  Pulse:    Resp: (P) 20 (!) 22  Temp:      Last Pain:  Vitals:   09/15/15 0949  TempSrc: Tympanic         Complications: No apparent anesthesia complications

## 2015-09-15 NOTE — Anesthesia Procedure Notes (Signed)
Procedure Name: LMA Insertion Performed by: Luvinia Lucy Pre-anesthesia Checklist: Patient identified, Patient being monitored, Timeout performed, Emergency Drugs available and Suction available Patient Re-evaluated:Patient Re-evaluated prior to inductionOxygen Delivery Method: Circle system utilized Preoxygenation: Pre-oxygenation with 100% oxygen Intubation Type: IV induction Ventilation: Mask ventilation without difficulty LMA: LMA inserted LMA Size: 3.0 Tube type: Oral Number of attempts: 1 Placement Confirmation: positive ETCO2 and breath sounds checked- equal and bilateral Tube secured with: Tape Dental Injury: Teeth and Oropharynx as per pre-operative assessment        

## 2015-09-16 NOTE — Telephone Encounter (Signed)
appt has been scheduled ° ° °michelle °

## 2015-09-21 ENCOUNTER — Other Ambulatory Visit: Payer: Self-pay | Admitting: *Deleted

## 2015-09-21 DIAGNOSIS — M9080 Osteopathy in diseases classified elsewhere, unspecified site: Secondary | ICD-10-CM

## 2015-09-21 DIAGNOSIS — M898X9 Other specified disorders of bone, unspecified site: Secondary | ICD-10-CM

## 2015-09-21 DIAGNOSIS — C541 Malignant neoplasm of endometrium: Secondary | ICD-10-CM

## 2015-09-21 DIAGNOSIS — C50912 Malignant neoplasm of unspecified site of left female breast: Secondary | ICD-10-CM

## 2015-09-21 DIAGNOSIS — E889 Metabolic disorder, unspecified: Secondary | ICD-10-CM

## 2015-09-22 MED ORDER — OXYCODONE-ACETAMINOPHEN 5-325 MG PO TABS: 1.0000 | ORAL_TABLET | ORAL | 0 refills | Status: DC | PRN

## 2015-09-27 ENCOUNTER — Encounter
Admission: RE | Admit: 2015-09-27 | Discharge: 2015-09-27 | Disposition: A | Discharge status: Home / Self Care | Payer: Medicare Other | Source: Ambulatory Visit | Attending: Oncology | Admitting: Oncology

## 2015-09-27 DIAGNOSIS — C541 Malignant neoplasm of endometrium: Secondary | ICD-10-CM | POA: Insufficient documentation

## 2015-09-28 ENCOUNTER — Ambulatory Visit
Admission: RE | Admit: 2015-09-28 | Discharge: 2015-09-28 | Disposition: A | Discharge status: Home / Self Care | Payer: Medicare Other | Source: Ambulatory Visit | Attending: Oncology | Admitting: Oncology

## 2015-09-28 DIAGNOSIS — R590 Localized enlarged lymph nodes: Secondary | ICD-10-CM | POA: Diagnosis not present

## 2015-09-28 DIAGNOSIS — C7951 Secondary malignant neoplasm of bone: Secondary | ICD-10-CM | POA: Diagnosis present

## 2015-09-28 DIAGNOSIS — C541 Malignant neoplasm of endometrium: Secondary | ICD-10-CM | POA: Diagnosis not present

## 2015-09-28 LAB — GLUCOSE, CAPILLARY: Glucose-Capillary: 80 mg/dL (ref 65–99)

## 2015-09-28 MED ORDER — FLUDEOXYGLUCOSE F - 18 (FDG) INJECTION
12.9500 | Freq: Once | INTRAVENOUS | Status: AC
  Administered 2015-09-28: 12.95 via INTRAVENOUS

## 2015-09-29 ENCOUNTER — Inpatient Hospital Stay (HOSPITAL_BASED_OUTPATIENT_CLINIC_OR_DEPARTMENT_OTHER): Payer: Medicare Other | Admitting: Internal Medicine

## 2015-09-29 ENCOUNTER — Inpatient Hospital Stay: Payer: Medicare Other

## 2015-09-29 ENCOUNTER — Inpatient Hospital Stay: Payer: Medicare Other | Attending: Internal Medicine | Admitting: Obstetrics and Gynecology

## 2015-09-29 VITALS — BP 153/75 | HR 63 | Temp 97.6°F | Resp 17 | Ht 63.0 in | Wt 121.0 lb

## 2015-09-29 VITALS — BP 153/75 | HR 63 | Temp 97.6°F | Ht 63.0 in | Wt 121.0 lb

## 2015-09-29 DIAGNOSIS — G4730 Sleep apnea, unspecified: Secondary | ICD-10-CM | POA: Insufficient documentation

## 2015-09-29 DIAGNOSIS — C7951 Secondary malignant neoplasm of bone: Secondary | ICD-10-CM

## 2015-09-29 DIAGNOSIS — G4710 Hypersomnia, unspecified: Secondary | ICD-10-CM | POA: Insufficient documentation

## 2015-09-29 DIAGNOSIS — M7989 Other specified soft tissue disorders: Secondary | ICD-10-CM

## 2015-09-29 DIAGNOSIS — Z923 Personal history of irradiation: Secondary | ICD-10-CM

## 2015-09-29 DIAGNOSIS — G40909 Epilepsy, unspecified, not intractable, without status epilepticus: Secondary | ICD-10-CM

## 2015-09-29 DIAGNOSIS — Z880 Allergy status to penicillin: Secondary | ICD-10-CM | POA: Diagnosis present

## 2015-09-29 DIAGNOSIS — C7989 Secondary malignant neoplasm of other specified sites: Secondary | ICD-10-CM | POA: Insufficient documentation

## 2015-09-29 DIAGNOSIS — Z79818 Long term (current) use of other agents affecting estrogen receptors and estrogen levels: Secondary | ICD-10-CM

## 2015-09-29 DIAGNOSIS — R319 Hematuria, unspecified: Secondary | ICD-10-CM | POA: Diagnosis not present

## 2015-09-29 DIAGNOSIS — Z86718 Personal history of other venous thrombosis and embolism: Secondary | ICD-10-CM | POA: Insufficient documentation

## 2015-09-29 DIAGNOSIS — Z853 Personal history of malignant neoplasm of breast: Secondary | ICD-10-CM | POA: Insufficient documentation

## 2015-09-29 DIAGNOSIS — Z170 Estrogen receptor positive status [ER+]: Secondary | ICD-10-CM

## 2015-09-29 DIAGNOSIS — Z90710 Acquired absence of both cervix and uterus: Secondary | ICD-10-CM

## 2015-09-29 DIAGNOSIS — N39490 Overflow incontinence: Secondary | ICD-10-CM | POA: Diagnosis not present

## 2015-09-29 DIAGNOSIS — M5137 Other intervertebral disc degeneration, lumbosacral region: Secondary | ICD-10-CM | POA: Diagnosis not present

## 2015-09-29 DIAGNOSIS — K5730 Diverticulosis of large intestine without perforation or abscess without bleeding: Secondary | ICD-10-CM | POA: Insufficient documentation

## 2015-09-29 DIAGNOSIS — Z7901 Long term (current) use of anticoagulants: Secondary | ICD-10-CM

## 2015-09-29 DIAGNOSIS — N189 Chronic kidney disease, unspecified: Secondary | ICD-10-CM

## 2015-09-29 DIAGNOSIS — C50812 Malignant neoplasm of overlapping sites of left female breast: Secondary | ICD-10-CM | POA: Insufficient documentation

## 2015-09-29 DIAGNOSIS — K219 Gastro-esophageal reflux disease without esophagitis: Secondary | ICD-10-CM | POA: Insufficient documentation

## 2015-09-29 DIAGNOSIS — Z79899 Other long term (current) drug therapy: Secondary | ICD-10-CM | POA: Insufficient documentation

## 2015-09-29 DIAGNOSIS — Z8673 Personal history of transient ischemic attack (TIA), and cerebral infarction without residual deficits: Secondary | ICD-10-CM | POA: Insufficient documentation

## 2015-09-29 DIAGNOSIS — Z803 Family history of malignant neoplasm of breast: Secondary | ICD-10-CM | POA: Diagnosis present

## 2015-09-29 DIAGNOSIS — C541 Malignant neoplasm of endometrium: Secondary | ICD-10-CM | POA: Diagnosis not present

## 2015-09-29 DIAGNOSIS — Z90722 Acquired absence of ovaries, bilateral: Secondary | ICD-10-CM | POA: Diagnosis not present

## 2015-09-29 DIAGNOSIS — E7800 Pure hypercholesterolemia, unspecified: Secondary | ICD-10-CM

## 2015-09-29 DIAGNOSIS — I129 Hypertensive chronic kidney disease with stage 1 through stage 4 chronic kidney disease, or unspecified chronic kidney disease: Secondary | ICD-10-CM | POA: Insufficient documentation

## 2015-09-29 DIAGNOSIS — N1330 Unspecified hydronephrosis: Secondary | ICD-10-CM | POA: Insufficient documentation

## 2015-09-29 DIAGNOSIS — J439 Emphysema, unspecified: Secondary | ICD-10-CM | POA: Diagnosis not present

## 2015-09-29 DIAGNOSIS — N261 Atrophy of kidney (terminal): Secondary | ICD-10-CM

## 2015-09-29 DIAGNOSIS — Z86010 Personal history of colonic polyps: Secondary | ICD-10-CM | POA: Insufficient documentation

## 2015-09-29 DIAGNOSIS — E042 Nontoxic multinodular goiter: Secondary | ICD-10-CM | POA: Insufficient documentation

## 2015-09-29 DIAGNOSIS — C50912 Malignant neoplasm of unspecified site of left female breast: Secondary | ICD-10-CM

## 2015-09-29 DIAGNOSIS — K222 Esophageal obstruction: Secondary | ICD-10-CM | POA: Insufficient documentation

## 2015-09-29 DIAGNOSIS — M79662 Pain in left lower leg: Secondary | ICD-10-CM

## 2015-09-29 DIAGNOSIS — R590 Localized enlarged lymph nodes: Secondary | ICD-10-CM | POA: Insufficient documentation

## 2015-09-29 DIAGNOSIS — Z9181 History of falling: Secondary | ICD-10-CM | POA: Insufficient documentation

## 2015-09-29 LAB — CBC WITH DIFFERENTIAL/PLATELET
Basophils Absolute: 0.1 10*3/uL (ref 0–0.1)
Basophils Relative: 2 %
EOS PCT: 6 %
Eosinophils Absolute: 0.2 10*3/uL (ref 0–0.7)
HCT: 39.6 % (ref 35.0–47.0)
Hemoglobin: 13.9 g/dL (ref 12.0–16.0)
LYMPHS ABS: 1 10*3/uL (ref 1.0–3.6)
LYMPHS PCT: 26 %
MCH: 33 pg (ref 26.0–34.0)
MCHC: 35 g/dL (ref 32.0–36.0)
MCV: 94.3 fL (ref 80.0–100.0)
MONO ABS: 0.4 10*3/uL (ref 0.2–0.9)
Monocytes Relative: 11 %
Neutro Abs: 2.1 10*3/uL (ref 1.4–6.5)
Neutrophils Relative %: 55 %
PLATELETS: 251 10*3/uL (ref 150–440)
RBC: 4.2 MIL/uL (ref 3.80–5.20)
RDW: 13.9 % (ref 11.5–14.5)
WBC: 3.8 10*3/uL (ref 3.6–11.0)

## 2015-09-29 LAB — COMPREHENSIVE METABOLIC PANEL
ALT: 16 U/L (ref 14–54)
AST: 19 U/L (ref 15–41)
Albumin: 4 g/dL (ref 3.5–5.0)
Alkaline Phosphatase: 33 U/L — ABNORMAL LOW (ref 38–126)
Anion gap: 2 — ABNORMAL LOW (ref 5–15)
BILIRUBIN TOTAL: 0.8 mg/dL (ref 0.3–1.2)
BUN: 21 mg/dL — AB (ref 6–20)
CALCIUM: 8.2 mg/dL — AB (ref 8.9–10.3)
CHLORIDE: 109 mmol/L (ref 101–111)
CO2: 25 mmol/L (ref 22–32)
CREATININE: 1.17 mg/dL — AB (ref 0.44–1.00)
GFR, EST AFRICAN AMERICAN: 49 mL/min — AB (ref >60)
GFR, EST NON AFRICAN AMERICAN: 42 mL/min — AB (ref >60)
GLUCOSE: 99 mg/dL (ref 65–99)
Potassium: 3.8 mmol/L (ref 3.5–5.1)
Sodium: 136 mmol/L (ref 135–145)
Total Protein: 6.7 g/dL (ref 6.5–8.1)

## 2015-09-29 NOTE — Progress Notes (Signed)
Stent placed in right kidney 2 weeks ago.  Still having blood in urine.  No other concerns

## 2015-09-29 NOTE — Progress Notes (Signed)
  Oncology Nurse Navigator Documentation  Navigator Location: [facility] (09/29/15 1100) Navigator Encounter Type: [place] Follow-up (09/29/15 1100)                                          Time Spent with Patient: 15 (09/29/15 1100)   Chaperoned pelvic exam. Follow up in 6 months with Gyn Onc.

## 2015-09-29 NOTE — Progress Notes (Signed)
[facility] OFFICE PROGRESS NOTE  Patient Care Team: Idelle Crouch, MD as PCP - General (Unknown Physician Specialty) [name], MD as Referring Physician (Surgery) [name], MD as Consulting Physician (Urology)  Cancer of left breast [facility])   Staging form: Breast, AJCC 7th Edition     Clinical: Stage IA (T1b, N0, M0) - Signed by Forest Gleason, MD on 10/20/2014    Oncology History   . Patient has a previous history of endometrium carcinoma of endometrium stage IB disease status post bilateral salpingo-oophorectomy and radiation therapy 4. Right hydronephrosis detected on MRI scan off lumbosacral spine. Cystoscopy with attempted stent placement revealed bladder tumor in September of 2016 biopsies positive for recurrent endometrial cancer  5. Patient had stent placement in the right kidney Started radiation therapy 6. Patient has finished radiation therapy with relief in the pain (November, 2016) 7. Started on Megace in December of 2016 8 deep vein thrombosis in the left lower extremity (February 08, 2014) Hematuria due to xeralto IVC filter placement on February 11, 2015 She is off xeralto 9.condition recently had a cystoscopy no evidence of bleeding was found so patient has been started on ELOQUIS (March 15, 2015)  10. Patient again had a significant bleeding with hematuria with eloquis and has been discontinued; Patient is on Plavix. ON  Megace. AUG 2017- PET IMPROVED; mediastinal uptake- monitor for now.   # August 25 2015- LLE DVT- start Eliquis [stop plavix];   FEB 2017-LEFT BREAST CA [9'0 clock- overlapping site] Estrogen receptor positive. Progesterone receptor positive. HER-2 receptor; Her 2neg; 5 mm tumor clinically stage IB N0 M0 tumor;s/p Lumpectomy; NO RT  # X-geva q 4 w  # MOLECULAR STUDIES: 08/23/15- MMR-ordered.      Cancer of left breast ([place])   10/20/2014 Initial Diagnosis    Cancer of left breast        Endometrial cancer ([place])   11/18/2014 Initial Diagnosis    Endometrial cancer ([place])        INTERVAL HISTORY:  [name] 82 y.o.  female pleasant patient above history of Metastatic endometrial cancer currently on Megace- Is here for follow-up.  Patient had noted significant improvement of the left lower extremity since being on anticoagulation. This is not completely resolved. She complains of intermittent blood in urine. She recently had ureteral stents placed.  She had recently fell down had a bruise on her abdomen. Did not hit the head. Attributes this to be mechanical.  Patient denies any abdominal pain. Denies any new onset of bone pain. Appetite is good. Denies any jaw pain. No chest pain shortness of the cough.   REVIEW OF SYSTEMS:  A complete 10 point review of system is done which is negative except mentioned above/history of present illness.   PAST MEDICAL HISTORY :  Past Medical History:  Diagnosis Date  . Arthritis    Degenerative Disc Disease  . Bone cancer ([place])   . Cancer of left breast ([place]) 10/20/2014  . Chronic kidney disease    HYDRONEPHROSIS  . Deep vein blood clot of left lower extremity ([place]) February 11, 2015   Left Leg  . Endometrial adenocarcinoma ([place]) 01/2012    stage Ib, grade 1, positive peritoneal cytology, no other risk factors or extra-uterine disease  . Gastric ulcer   . GERD (gastroesophageal reflux disease)   . Hearing loss   . Hiccups   . History of brachytherapy   . History of colon polyps   . History  of DVT (deep vein thrombosis)   . History of esophageal stricture   . History of shingles   . Inguinal lymphocyst   . Mild aphasia   . Pain    SEVERE CHRONIC RIGHT LEG,HIP,FOOT  . PE (pulmonary embolism)    H/O  . Stroke ([place]) 04/06/2011   residual:  aphasia    PAST SURGICAL HISTORY :   Past Surgical History:  Procedure Laterality Date  . ABDOMINAL HYSTERECTOMY  December 2013   history of cervical cancer  . BACK SURGERY      fusion of L3&4  . BREAST BIOPSY Left 10/13/2014   path pending  . BREAST CYST ASPIRATION N/A   . CATARACT EXTRACTION W/ INTRAOCULAR LENS IMPLANT     right  . CYSTOSCOPY W/ RETROGRADES Right 10/28/2014   Procedure: CYSTOSCOPY WITH RETROGRADE PYELOGRAM ATTEMPT, UNABLE TO PASS ;  Surgeon: [name], MD;  Location: [facility];  Service: Urology;  Laterality: Right;  . CYSTOSCOPY W/ RETROGRADES Right 09/15/2015   Procedure: CYSTOSCOPY WITH RETROGRADE PYELOGRAM;  Surgeon: [name], MD;  Location: [facility];  Service: Urology;  Laterality: Right;  . CYSTOSCOPY W/ URETERAL STENT PLACEMENT Right 03/10/2015   Procedure: CYSTOSCOPY WITH STENT REPLACEMENT;  Surgeon: [name], MD;  Location: [facility];  Service: Urology;  Laterality: Right;  . CYSTOSCOPY W/ URETERAL STENT PLACEMENT Right 06/09/2015   Procedure: CYSTOSCOPY WITH STENT REPLACEMENT;  Surgeon: [name], MD;  Location: [facility];  Service: Urology;  Laterality: Right;  . CYSTOSCOPY W/ URETERAL STENT PLACEMENT Right 09/15/2015   Procedure: CYSTOSCOPY WITH STENT REPLACEMENT;  Surgeon: [name], MD;  Location: [facility];  Service: Urology;  Laterality: Right;  . CYSTOSCOPY WITH STENT PLACEMENT Right 10/28/2014   Procedure: BLADDER BIOPSY WITH FULGERATION ;  Surgeon: [name], MD;  Location: [facility];  Service: Urology;  Laterality: Right;  . ESOPHAGEAL DILATION    . EYE SURGERY Bilateral    Cataract Extraction  . PARTIAL MASTECTOMY WITH NEEDLE LOCALIZATION Left 03/30/2015   Procedure: PARTIAL MASTECTOMY WITH NEEDLE LOCALIZATION;  Surgeon: [name], MD;  Location: [facility];  Service: General;  Laterality: Left;  . PERIPHERAL VASCULAR CATHETERIZATION N/A 02/11/2015   Procedure: IVC Filter Insertion;  Surgeon: Algernon Huxley, MD;  Location: [facility];  Service: Cardiovascular;  Laterality: N/A;  . PORTACATH PLACEMENT Right 11/26/2014   Procedure: INSERTION PORT-A-CATH;  Surgeon: [name], MD;  Location: [facility];  Service: General;  Laterality: Right;  . SENTINEL NODE BIOPSY Left 03/30/2015   Procedure: SENTINEL NODE BIOPSY;  Surgeon: [name], MD;  Location: [facility];  Service: General;  Laterality: Left;  . thyroid removed    . THYROIDECTOMY, PARTIAL    . TONSILLECTOMY    . TOTAL ABDOMINAL HYSTERECTOMY W/ BILATERAL SALPINGOOPHORECTOMY Bilateral    staging biopsies    FAMILY HISTORY :   Family History  Problem Relation Age of Onset  . Stroke Mother   . Breast cancer Maternal Aunt     x 2    SOCIAL HISTORY:   Social History  Substance Use Topics  . Smoking status: Never Smoker  . Smokeless tobacco: Never Used  . Alcohol use No    ALLERGIES:  is allergic to penicillins; hydrocodone-acetaminophen; novocain [procaine]; and sulfa antibiotics.  MEDICATIONS:  Current Outpatient Prescriptions  Medication Sig Dispense Refill  . apixaban (ELIQUIS) 5 MG TABS tablet Take 1 tablet (5 mg total) by mouth 2 (two) times daily. [place]  tablet 5  . fentaNYL (DURAGESIC - DOSED MCG/HR) 50 MCG/HR Place 1 patch (50 mcg total) onto the skin every 3 (three) days. 10 patch 0  . gabapentin (NEURONTIN) 300 MG capsule Take 1 capsule (300 mg total) by mouth 3 (three) times daily. 90 capsule 2  . megestrol (MEGACE) 40 MG tablet Take 1 tablet (40 mg total) by mouth 4 (four) times daily. 120 tablet 3  . Multiple Vitamins-Minerals (MULTIVITAMIN WITH MINERALS) tablet Take 1 tablet by mouth daily. Reported on 06/30/2015    . oxyCODONE-acetaminophen (PERCOCET/ROXICET) 5-325 MG tablet Take 1-2 tablets by mouth every 4 (four) hours as needed for severe pain. 90 tablet 0   No current facility-administered medications for this visit.     PHYSICAL EXAMINATION: ECOG PERFORMANCE STATUS: 1 - Symptomatic but completely ambulatory  BP (!) 153/75 (BP Location: Left Arm, Patient Position: Sitting)   Pulse 63   Temp 97.6 F (36.4 C) (Tympanic)   Resp 17   Ht _0  (1.6 m)   Wt 121  lb (54.9 kg)   BMI 21.43 kg/m   Filed Weights   09/29/15 1046  Weight: 121 lb (54.9 kg)    GENERAL: [place] Caucasian female patient walking herself Alert, no distress and comfortable. With her husband/son EYES: no pallor or icterus OROPHARYNX: no thrush or ulceration; good dentition  NECK: supple, no masses felt LYMPH:  no palpable lymphadenopathy in the cervical, axillary or inguinal regions LUNGS: clear to auscultation and  No wheeze or crackles HEART/CVS: regular rate & rhythm and no murmurs; Left lower extremity swollen compared to the right.  ABDOMEN:abdomen soft, non-tender and normal bowel sounds Musculoskeletal:no cyanosis of digits and no clubbing  PSYCH: alert & oriented x 3 with fluent speech NEURO: no focal motor/sensory deficits SKIN:  no rashes or significant lesions  LABORATORY DATA:  I have reviewed the data as listed    Component Value Date/Time   NA 136 09/29/2015 0906   NA 139 11/04/2013 1257   K 3.8 09/29/2015 0906   K 4.3 03/05/2014 1156   CL 109 09/29/2015 0906   CL 106 11/04/2013 1257   CO2 25 09/29/2015 0906   CO2 26 11/04/2013 1257   GLUCOSE 99 09/29/2015 0906   GLUCOSE 96 11/04/2013 1257   BUN 21 (H) 09/29/2015 0906   BUN 13 11/04/2013 1257   CREATININE 1.17 (H) 09/29/2015 0906   CREATININE 0.90 11/04/2013 1257   CALCIUM 8.2 (L) 09/29/2015 0906   CALCIUM 8.5 11/04/2013 1257   PROT 6.7 09/29/2015 0906   PROT 6.8 11/04/2013 1257   ALBUMIN 4.0 09/29/2015 0906   ALBUMIN 3.5 11/04/2013 1257   AST 19 09/29/2015 0906   AST 25 11/04/2013 1257   ALT 16 09/29/2015 0906   ALT 20 11/04/2013 1257   ALKPHOS 33 (L) 09/29/2015 0906   ALKPHOS 64 11/04/2013 1257   BILITOT 0.8 09/29/2015 0906   BILITOT 0.5 11/04/2013 1257   GFRNONAA 42 (L) 09/29/2015 0906   GFRNONAA >60 11/04/2013 1257   GFRNONAA >60 03/29/2012 1650   GFRAA 49 (L) 09/29/2015 0906   GFRAA >60 11/04/2013 1257   GFRAA >60 03/29/2012 1650    No results found for: SPEP,  UPEP  Lab Results  Component Value Date   WBC 3.8 09/29/2015   NEUTROABS 2.1 09/29/2015   HGB 13.9 09/29/2015   HCT 39.6 09/29/2015   MCV 94.3 09/29/2015   PLT 251 09/29/2015      Chemistry      Component Value Date/Time  NA 136 09/29/2015 0906   NA 139 11/04/2013 1257   K 3.8 09/29/2015 0906   K 4.3 03/05/2014 1156   CL 109 09/29/2015 0906   CL 106 11/04/2013 1257   CO2 25 09/29/2015 0906   CO2 26 11/04/2013 1257   BUN 21 (H) 09/29/2015 0906   BUN 13 11/04/2013 1257   CREATININE 1.17 (H) 09/29/2015 0906   CREATININE 0.90 11/04/2013 1257      Component Value Date/Time   CALCIUM 8.2 (L) 09/29/2015 0906   CALCIUM 8.5 11/04/2013 1257   ALKPHOS 33 (L) 09/29/2015 0906   ALKPHOS 64 11/04/2013 1257   AST 19 09/29/2015 0906   AST 25 11/04/2013 1257   ALT 16 09/29/2015 0906   ALT 20 11/04/2013 1257   BILITOT 0.8 09/29/2015 0906   BILITOT 0.5 11/04/2013 1257       RADIOGRAPHIC STUDIES: I have personally reviewed the radiological images as listed and agreed with the findings in the report. Nm Pet Image Restag (ps) Skull Base To Thigh  Result Date: 09/28/2015 CLINICAL DATA:  Subsequent treatment strategy for metastatic endometrial carcinoma. Left breast carcinoma. EXAM: NUCLEAR MEDICINE PET SKULL BASE TO THIGH TECHNIQUE: 13.0 mCi F-18 FDG was injected intravenously. Full-ring PET imaging was performed from the skull base to thigh after the radiotracer. CT data was obtained and used for attenuation correction and anatomic localization. FASTING BLOOD GLUCOSE:  Value: 80 mg/dl COMPARISON:  AP CT on 04/26/2015 and chest CT on 11/09/2014 FINDINGS: NECK No hypermetabolic lymph nodes in the neck. Previous right thyroidectomy again noted. CHEST A new 9 mm hypermetabolic mediastinal lymph node is seen in the precarinal region. This measures 9 mm on image 75/3, with SUV max of 4.5. No other hypermetabolic lymph nodes identified within the thorax. No suspicious pulmonary nodules seen on CT  images. Mild emphysema and bilateral upper lobe scarring again noted. ABDOMEN/PELVIS No abnormal hypermetabolic activity within the liver, pancreas, adrenal glands, or spleen. No hypermetabolic lymph nodes or other nodules seen within in abdomen or pelvis. Previously seen soft tissue prominence along the left pelvic sidewall obturator space, and soft tissue nodularity along the left vaginal cuff are no longer visualized. No hypermetabolic peritoneal nodules identified. No evidence of ascites. Right ureteral stent remains in appropriate position. No evidence of hydronephrosis. Diffuse right renal parenchymal atrophy again noted. IVC filter remains in place. Sigmoid diverticulosis again demonstrated, without evidence of diverticulitis. Prior hysterectomy. SKELETON Decreased size of lytic bone lesion and associated soft tissue mass involving the right ilium. This currently measures 2.9 x 4.1 cm on image 184/3 compared to 3.4 x 4.4 cm previously. This shows low-grade metabolic activity with SUV max of 3.5. No other suspicious bone lesions identified. Lumbar spine fusion hardware again noted. IMPRESSION: New 9 mm hypermetabolic precarinal mediastinal lymph node. Differential diagnosis includes metastatic disease and reactive/ inflammatory etiology. Recommend continued followup by chest CT with contrast in 3-6 months. Decreased size of lytic bone metastasis involving the right ilium. No other sites of metabolically active metastatic disease identified. Electronically Signed   By: Earle Gell M.D.   On: 09/28/2015 16:28     ASSESSMENT & PLAN:  Endometrial cancer ([place]) Metastatic endometrial cancer to the bone- currently on Megace. Tolerating it well. Recent CT of the abdomen and pelvis- improved response. Continue Megace. PET- AUG 22nd- Improved.   # Swelling of the left lower extremity worsening stat Dopplers of the lower extremity DVT extensive. Continue Eliquis  5 mg twice a day.  # Metastatic lesions to  the  bone on X-geva- start again at next visit.   # pain- on Fentanyl/ percocet continue.   # falls- recommend assistive devices.   # Follow-up in 4 weeks; CBC CMP/ Discussed with Dr.Berchuck.   Orders Placed This Encounter  Procedures  . Comprehensive metabolic panel    Standing Status:   Future    Standing Expiration Date:   09/28/2016  . CBC with Differential    Standing Status:   Future    Standing Expiration Date:   09/28/2016   All questions were answered. The patient knows to call the clinic with any problems, questions or concerns.      Cammie Sickle, MD 09/29/2015 5:35 PM

## 2015-09-29 NOTE — Progress Notes (Signed)
Gynecologic Oncology Visit   Referring Provider:  Dr. Rogue Bussing  Chief Concern: recurrent endometroid endometrial adenocarcinoma, stage Ib, grade 1.  Subjective:  Traci [name] is a 82 y.o. female who is seen in consultation from Dr. Oliva Bustard for recurrent endometroid endometrial adenocarcinoma, stage Ib, grade 1  Returns today for follow up.  Has some back pain in right ileum area.  Continues taking Megace 40 mg qid for recurrent endometrial cancer and has had partial response by imaging.  PET/CT result below.    PET/CT 09/28/15 CHEST A new 9 mm hypermetabolic mediastinal lymph node is seen in the precarinal region. This measures 9 mm on image 75/3, with SUV max of 4.5. No other hypermetabolic lymph nodes identified within the thorax. No suspicious pulmonary nodules seen on CT images. Mild emphysema and bilateral upper lobe scarring again noted.  ABDOMEN/PELVIS No abnormal hypermetabolic activity within the liver, pancreas, adrenal glands, or spleen. No hypermetabolic lymph nodes or other nodules seen within in abdomen or pelvis.  Previously seen soft tissue prominence along the left pelvic sidewall obturator space, and soft tissue nodularity along the left vaginal cuff are no longer visualized. No hypermetabolic peritoneal nodules identified. No evidence of ascites.  Right ureteral stent remains in appropriate position. No evidence of hydronephrosis. Diffuse right renal parenchymal atrophy again noted. IVC filter remains in place. Sigmoid diverticulosis again demonstrated, without evidence of diverticulitis. Prior hysterectomy.  SKELETON Decreased size of lytic bone lesion and associated soft tissue mass involving the right ilium. This currently measures 2.9 x 4.1 cm on image 184/3 compared to 3.4 x 4.4 cm previously. This shows low-grade metabolic activity with SUV max of 3.5. No other suspicious bone lesions identified. Lumbar spine fusion hardware again  noted.  IMPRESSION: New 9 mm hypermetabolic precarinal mediastinal lymph node. Differential diagnosis includes metastatic disease and reactive/ inflammatory etiology. Recommend continued followup by chest CT with contrast in 3-6 months.  Decreased size of lytic bone metastasis involving the right ilium.  No other sites of metabolically active metastatic disease identified  Oncology History She was diagnosed with endometrial cancer in 12/13 and had recurrence in 2016.  01/2012  endometroid endometrial adenocarcinoma, stage Ib, grade 1, positive peritoneal cytology, no other risk factors or extra-uterine disease, TAH/BSO and staging, lymphocyst post-op, vaginal brachytherapy  In 2016 patient had abnormal mammogram of the left breast and biopsy was positive for invasive carcinoma ER+/PR+ treated with lumpectomy. Staging work up showed right hydronephrosis and right pelvic mass invading bone and muscle.  See below.  10/28/14 Cystoscopy done for stent placement and showed tumor invading bladder.  BIOPSY: WELL-DIFFERENTIATED ADENOCARCINOMA MORPHOLOGICALLY SIMILAR TO PREVIOUS ENDOMETRIAL ADENOCARCINOMA.  The stent could not be placed so 11/13/14 Right PCN placed in radiology.  CT scan 11/09/14 Bilateral pelvic lymphadenopathy, right side greater than left, consistent with metastatic disease. Small soft tissue nodule involving the left vaginal cuff and 1.5 cm soft tissue nodule or lymphadenopathy in the sigmoid mesocolon, consistent with carcinoma. Right iliac bone metastasis with associated iliopsoas soft tissue mass. Severe right hydronephrosis and diffuse right renal parenchymal atrophy secondary to pelvic metastatic disease. No metastatic disease identified within the abdomen or chest.  She underwent external pelvic radiation and completed in 11/16 with relief in the pain,  Then started on Megace 40 mg qid in December of 2016 and developed deep vein thrombosis in the left lower extremity  (February 09, 2015). Hematuria due toxeralto IVC filter placement on February 11, 2015 and xeralto stopped. Cystoscopy no evidence of bleeding, so patient  has been started on ELOQUIS (March 15, 2015).  Patient again had a significant bleeding with hematuria with eloquis and was discontinued and Plavix started. June 2017- PET IMPROVED. August 25 2015- LLE DVT- start Eliquis [stop plavix];   MOLECULAR STUDIES: 08/23/15- MMR-ordered.   Health maintenance: Her breast cancer screening is done by her PCP, Dr. Judithann Sheen, and she is scheduled for a mammogram today.   Problem List: Patient Active Problem List   Diagnosis Date Noted  . Pain and swelling of left lower leg 08/25/2015  . Seizure disorder (HCC) 04/06/2015  . Personal history of urinary infection 03/15/2015  . Cerebrovascular accident (CVA) (HCC) 02/18/2015  . Endometrial cancer (HCC) 11/18/2014  . Degeneration of intervertebral disc of lumbosacral region 11/14/2014  . Overflow incontinence 11/14/2014  . Pure hypercholesterolemia 11/14/2014  . Acquired hypothyroidism 10/20/2014  . DDD (degenerative disc disease), lumbosacral 10/20/2014  . Benign neoplasm of colon 10/20/2014  . Acid reflux 10/20/2014  . Esophageal stenosis 10/20/2014  . Bloodgood disease 10/20/2014  . History of colon polyps 10/20/2014  . H/O gastric ulcer 10/20/2014  . Healed or old pulmonary embolism 10/20/2014  . History of urinary anomaly 10/20/2014  . BP (high blood pressure) 10/20/2014  . Hypersomnia with sleep apnea 10/20/2014  . Multinodular goiter 10/20/2014  . Angina pectoris (HCC) 10/20/2014  . Seizure (HCC) 10/20/2014  . Digestive symptom 10/20/2014  . Incontinence overflow, urine 10/20/2014  . Dupuytren's contracture of foot 10/20/2014  . Hypercholesterolemia without hypertriglyceridemia 10/20/2014  . Cancer of left breast (HCC) 10/20/2014  . History of endometrial cancer 09/30/2014  . Lumbar radiculopathy 09/04/2014  . Deep vein thrombosis (HCC)  07/15/2013  . Deep vein thrombosis (DVT) (HCC) 07/15/2013  . Stroke [facility]) 04/07/2011    Past Medical History: Past Medical History:  Diagnosis Date  . Arthritis    Degenerative Disc Disease  . Bone cancer (HCC)   . Cancer of left breast (HCC) 10/20/2014  . Chronic kidney disease    HYDRONEPHROSIS  . Deep vein blood clot of left lower extremity (HCC) February 11, 2015   Left Leg  . Endometrial adenocarcinoma (HCC) 01/2012    stage Ib, grade 1, positive peritoneal cytology, no other risk factors or extra-uterine disease  . Gastric ulcer   . GERD (gastroesophageal reflux disease)   . Hearing loss   . Hiccups   . History of brachytherapy   . History of colon polyps   . History of DVT (deep vein thrombosis)   . History of esophageal stricture   . History of shingles   . Inguinal lymphocyst   . Mild aphasia   . Pain    SEVERE CHRONIC RIGHT LEG,HIP,FOOT  . PE (pulmonary embolism)    H/O  . Stroke [facility]) 04/06/2011   residual:  aphasia    Past Surgical History: Past Surgical History:  Procedure Laterality Date  . ABDOMINAL HYSTERECTOMY  December 2013   history of cervical cancer  . BACK SURGERY     fusion of L3&4  . BREAST BIOPSY Left 10/13/2014   path pending  . BREAST CYST ASPIRATION N/A   . CATARACT EXTRACTION W/ INTRAOCULAR LENS IMPLANT     right  . CYSTOSCOPY W/ RETROGRADES Right 10/28/2014   Procedure: CYSTOSCOPY WITH RETROGRADE PYELOGRAM ATTEMPT, UNABLE TO PASS ;  Surgeon: Hildred Laser, MD;  Location: [facility] ORS;  Service: Urology;  Laterality: Right;  . CYSTOSCOPY W/ RETROGRADES Right 09/15/2015   Procedure: CYSTOSCOPY WITH RETROGRADE PYELOGRAM;  Surgeon: Hildred Laser, MD;  Location[facility]  ORS;  Service: Urology;  Laterality: Right;  . CYSTOSCOPY W/ URETERAL STENT PLACEMENT Right 03/10/2015   Procedure: CYSTOSCOPY WITH STENT REPLACEMENT;  Surgeon: [name], MD;  Location: [facility] ORS;  Service: Urology;  Laterality: Right;  . CYSTOSCOPY W/ URETERAL STENT  PLACEMENT Right 06/09/2015   Procedure: CYSTOSCOPY WITH STENT REPLACEMENT;  Surgeon: [name], MD;  Location: [facility] ORS;  Service: Urology;  Laterality: Right;  . CYSTOSCOPY W/ URETERAL STENT PLACEMENT Right 09/15/2015   Procedure: CYSTOSCOPY WITH STENT REPLACEMENT;  Surgeon: [name], MD;  Location: [facility] ORS;  Service: Urology;  Laterality: Right;  . CYSTOSCOPY WITH STENT PLACEMENT Right 10/28/2014   Procedure: BLADDER BIOPSY WITH FULGERATION ;  Surgeon: [name], MD;  Location: [facility] ORS;  Service: Urology;  Laterality: Right;  . ESOPHAGEAL DILATION    . EYE SURGERY Bilateral    Cataract Extraction  . PARTIAL MASTECTOMY WITH NEEDLE LOCALIZATION Left 03/30/2015   Procedure: PARTIAL MASTECTOMY WITH NEEDLE LOCALIZATION;  Surgeon: [name], MD;  Location: [facility] ORS;  Service: General;  Laterality: Left;  . PERIPHERAL VASCULAR CATHETERIZATION N/A 02/11/2015   Procedure: IVC Filter Insertion;  Surgeon: Algernon Huxley, MD;  Location: [facility];  Service: Cardiovascular;  Laterality: N/A;  . PORTACATH PLACEMENT Right 11/26/2014   Procedure: INSERTION PORT-A-CATH;  Surgeon: [name], MD;  Location: [facility] ORS;  Service: General;  Laterality: Right;  . SENTINEL NODE BIOPSY Left 03/30/2015   Procedure: SENTINEL NODE BIOPSY;  Surgeon: [name], MD;  Location: [facility] ORS;  Service: General;  Laterality: Left;  . thyroid removed    . THYROIDECTOMY, PARTIAL    . TONSILLECTOMY    . TOTAL ABDOMINAL HYSTERECTOMY W/ BILATERAL SALPINGOOPHORECTOMY Bilateral    staging biopsies    Past Gynecologic History:  See HPI  OB History:  OB History  Gravida Para Term Preterm AB Living  2            SAB TAB Ectopic Multiple Live Births               # Outcome Date GA Lbr Len/2nd Weight Sex Delivery Anes PTL Lv  2 Gravida           1 Gravida               Family History: Family History  Problem Relation Age of Onset  . Stroke Mother   . Breast  cancer Maternal Aunt     x 2    Social History: Social History   Social History  . Marital status: Married    Spouse name: N/A  . Number of children: N/A  . Years of education: N/A   Occupational History  . Not on file.   Social History Main Topics  . Smoking status: Never Smoker  . Smokeless tobacco: Never Used  . Alcohol use No  . Drug use: No  . Sexual activity: Yes    Partners: Male   Other Topics Concern  . Not on file   Social History Narrative  . No narrative on file    Allergies: Allergies  Allergen Reactions  . Penicillins Rash  . Hydrocodone-Acetaminophen Nausea Only  . Novocain [Procaine] Other (See Comments) and Nausea And Vomiting    Chest pain  . Sulfa Antibiotics Other (See Comments)    CHEST PAIN    Current Medications: Current Outpatient Prescriptions  Medication Sig Dispense Refill  . apixaban (ELIQUIS) 5 MG TABS tablet Take 1 tablet (5 mg  total) by mouth 2 (two) times daily. 60 tablet 5  . fentaNYL (DURAGESIC - DOSED MCG/HR) 50 MCG/HR Place 1 patch (50 mcg total) onto the skin every 3 (three) days. 10 patch 0  . gabapentin (NEURONTIN) 300 MG capsule Take 1 capsule (300 mg total) by mouth 3 (three) times daily. 90 capsule 2  . megestrol (MEGACE) 40 MG tablet Take 1 tablet (40 mg total) by mouth 4 (four) times daily. 120 tablet 3  . Multiple Vitamins-Minerals (MULTIVITAMIN WITH MINERALS) tablet Take 1 tablet by mouth daily. Reported on 06/30/2015    . oxyCODONE-acetaminophen (PERCOCET/ROXICET) 5-325 MG tablet Take 1-2 tablets by mouth every 4 (four) hours as needed for severe pain. 90 tablet 0   No current facility-administered medications for this visit.     Review of Systems General: no complaints  HEENT: no complaints  Lungs: no complaints  Cardiac: no complaints  GI: constipation  GU: no complaints  Musculoskeletal: back pain  Extremities: right leg pain  Skin: no complaints  Neuro: neuropathic pain in her RLE due to back issues   Endocrine: no complaints  Psych: no complaints      Objective:  Physical Examination:  BP (!) 153/75 (BP Location: Left Arm, Patient Position: Sitting)   Pulse 63   Temp 97.6 F (36.4 C) (Tympanic)   Ht '5\' 3"'$  (1.6 m)   Wt 121 lb (54.9 kg)   BMI 21.43 kg/m    ECOG Performance Status: 1 - Symptomatic but completely ambulatory  General appearance: alert and appears stated age HEENT:PERRLA, extra ocular movement intact and sclera clear, anicteric Lymph node survey: non-palpable, axillary, inguinal, supraclavicular Cardiovascular: regular rate and rhythm Respiratory: normal air entry, lungs clear to auscultation Abdomen: soft, non-tender, without masses or organomegaly, no hernias and well healed incision Extremities: extremities normal, atraumatic, no cyanosis or edema Neurological exam reveals alert, oriented, normal speech, no focal findings.  Pelvic: exam chaperoned by nurse;  Vulva: normal appearing vulva with no masses, tenderness or lesions; Vagina: agglutinated and length is only 4 cm, no tumor seen or palpated; Adnexa: surgically absent; Uterus and Cervix: surgically absent, vaginal cuff not visualized due to agglutination;  No masses.  Rectal: confirmatory.    Assessment:  Traci [name] is a 82 y.o. female diagnosed with pelvic recurrence of stage IB grade 1 endometroid endometrial adenocarcinoma s/p surgery and brachytherapy 2013. She had a right pelvic side wall mass invading bone/bladder and hydronephrosis, and got a ureteral stent and external pelvic radiation in 11/16.  Now on adjuvant Megace and has residual disease in pelvis, with partial response on PET/CT.  Has had issues with VTE and bleeding managed by Dr Tish Men. IVC filter remains in place.  She has an atrophic right kidney and has right ureteral stent managed by Urology.  Plan:   Problem List Items Addressed This Visit      Genitourinary   Endometrial cancer ([place]) - Primary    Other Visit Diagnoses    None.     I discussed case with Dr Rogue Bussing.  Her recurrent grade I endometrial cancer is still persistent in the right pelvis and involves bone, but she has minimal symptoms and partial response to Megace.  Agree with continuing Megace for treatment. She will RTC to see [place] in 6 months and will follow up with Dr Rogue Bussing sooner.   Mellody Drown, MD  CC: Dr. Blanche East, MD [facility] [place], [place] [id number] [phone]

## 2015-09-29 NOTE — Assessment & Plan Note (Addendum)
Metastatic endometrial cancer to the bone- currently on Megace. Tolerating it well. Recent CT of the abdomen and pelvis- improved response. Continue Megace. PET- AUG 22nd- Improved.   # Swelling of the left lower extremity worsening stat Dopplers of the lower extremity DVT extensive. Continue Eliquis  5 mg twice a day.  # Metastatic lesions to the bone on X-geva- start again at next visit.   # pain- on Fentanyl/ percocet continue.   # falls- recommend assistive devices.   # Follow-up in 4 weeks; CBC CMP/ Discussed with Dr.Berchuck.

## 2015-09-30 ENCOUNTER — Ambulatory Visit: Payer: Medicare Other

## 2015-09-30 ENCOUNTER — Inpatient Hospital Stay: Payer: Medicare Other

## 2015-09-30 LAB — CA 125: CA 125: 17.8 U/mL (ref 0.0–38.1)

## 2015-10-04 ENCOUNTER — Telehealth: Payer: Self-pay | Admitting: Urology

## 2015-10-04 NOTE — Telephone Encounter (Signed)
Spoke with pt in reference blood post stent exchange. Pt states that every time she uses the bathroom she excretes bright red blood. Please advise.

## 2015-10-04 NOTE — Telephone Encounter (Signed)
Patient has hematuria after stent exchange. Hg from 5 days ago is stable from prior Hg. No clots. On blood thinner. Planning to remove stent in near future due to atrophic kidney. Did not remove at last OR visit due to new blood thinner and requirement of antegrade placement by IR when first placed for flank pain. Patient will call back if still having hematuria in 2 weeks. Will remove stent sooner if hematuria continues.

## 2015-10-04 NOTE — Telephone Encounter (Signed)
Blood in urine.  Budzyn did surgery and she is very concerned and wants to know what is going on.  Please give pt a call [phone]

## 2015-10-06 ENCOUNTER — Ambulatory Visit
Admission: RE | Admit: 2015-10-06 | Discharge: 2015-10-06 | Disposition: A | Discharge status: Home / Self Care | Payer: Medicare Other | Source: Ambulatory Visit | Attending: Oncology | Admitting: Oncology

## 2015-10-06 ENCOUNTER — Other Ambulatory Visit: Payer: Self-pay | Admitting: Oncology

## 2015-10-06 DIAGNOSIS — C541 Malignant neoplasm of endometrium: Secondary | ICD-10-CM

## 2015-10-06 DIAGNOSIS — C50912 Malignant neoplasm of unspecified site of left female breast: Secondary | ICD-10-CM

## 2015-10-06 DIAGNOSIS — Z853 Personal history of malignant neoplasm of breast: Secondary | ICD-10-CM | POA: Diagnosis present

## 2015-10-06 HISTORY — DX: Malignant neoplasm of unspecified site of unspecified female breast: C50.919

## 2015-10-08 ENCOUNTER — Other Ambulatory Visit: Payer: Self-pay | Admitting: Internal Medicine

## 2015-10-08 ENCOUNTER — Inpatient Hospital Stay: Payer: Medicare Other | Attending: Internal Medicine

## 2015-10-08 ENCOUNTER — Telehealth: Payer: Self-pay | Admitting: *Deleted

## 2015-10-08 ENCOUNTER — Ambulatory Visit: Payer: Medicare Other | Admitting: Internal Medicine

## 2015-10-08 DIAGNOSIS — R319 Hematuria, unspecified: Secondary | ICD-10-CM

## 2015-10-08 DIAGNOSIS — Z86711 Personal history of pulmonary embolism: Secondary | ICD-10-CM | POA: Insufficient documentation

## 2015-10-08 DIAGNOSIS — Z90710 Acquired absence of both cervix and uterus: Secondary | ICD-10-CM | POA: Diagnosis not present

## 2015-10-08 DIAGNOSIS — C7951 Secondary malignant neoplasm of bone: Secondary | ICD-10-CM | POA: Insufficient documentation

## 2015-10-08 DIAGNOSIS — Z79818 Long term (current) use of other agents affecting estrogen receptors and estrogen levels: Secondary | ICD-10-CM | POA: Insufficient documentation

## 2015-10-08 DIAGNOSIS — M25551 Pain in right hip: Secondary | ICD-10-CM | POA: Diagnosis not present

## 2015-10-08 DIAGNOSIS — C7911 Secondary malignant neoplasm of bladder: Secondary | ICD-10-CM | POA: Insufficient documentation

## 2015-10-08 DIAGNOSIS — C541 Malignant neoplasm of endometrium: Secondary | ICD-10-CM

## 2015-10-08 DIAGNOSIS — C50812 Malignant neoplasm of overlapping sites of left female breast: Secondary | ICD-10-CM | POA: Diagnosis not present

## 2015-10-08 DIAGNOSIS — C50922 Malignant neoplasm of unspecified site of left male breast: Secondary | ICD-10-CM

## 2015-10-08 DIAGNOSIS — Z86718 Personal history of other venous thrombosis and embolism: Secondary | ICD-10-CM | POA: Diagnosis not present

## 2015-10-08 DIAGNOSIS — Z923 Personal history of irradiation: Secondary | ICD-10-CM | POA: Diagnosis not present

## 2015-10-08 DIAGNOSIS — Z8673 Personal history of transient ischemic attack (TIA), and cerebral infarction without residual deficits: Secondary | ICD-10-CM | POA: Insufficient documentation

## 2015-10-08 DIAGNOSIS — Z90722 Acquired absence of ovaries, bilateral: Secondary | ICD-10-CM | POA: Insufficient documentation

## 2015-10-08 DIAGNOSIS — G8929 Other chronic pain: Secondary | ICD-10-CM | POA: Diagnosis not present

## 2015-10-08 DIAGNOSIS — I82402 Acute embolism and thrombosis of unspecified deep veins of left lower extremity: Secondary | ICD-10-CM | POA: Insufficient documentation

## 2015-10-08 DIAGNOSIS — Z9012 Acquired absence of left breast and nipple: Secondary | ICD-10-CM | POA: Insufficient documentation

## 2015-10-08 DIAGNOSIS — Z7901 Long term (current) use of anticoagulants: Secondary | ICD-10-CM | POA: Insufficient documentation

## 2015-10-08 DIAGNOSIS — Z170 Estrogen receptor positive status [ER+]: Secondary | ICD-10-CM | POA: Insufficient documentation

## 2015-10-08 LAB — COMPREHENSIVE METABOLIC PANEL
ALK PHOS: 32 U/L — AB (ref 38–126)
ALT: 13 U/L — AB (ref 14–54)
ANION GAP: 5 (ref 5–15)
AST: 18 U/L (ref 15–41)
Albumin: 4 g/dL (ref 3.5–5.0)
BUN: 24 mg/dL — ABNORMAL HIGH (ref 6–20)
CALCIUM: 8.9 mg/dL (ref 8.9–10.3)
CHLORIDE: 109 mmol/L (ref 101–111)
CO2: 26 mmol/L (ref 22–32)
CREATININE: 1.07 mg/dL — AB (ref 0.44–1.00)
GFR, EST AFRICAN AMERICAN: 54 mL/min — AB (ref >60)
GFR, EST NON AFRICAN AMERICAN: 47 mL/min — AB (ref >60)
Glucose, Bld: 105 mg/dL — ABNORMAL HIGH (ref 65–99)
Potassium: 3.7 mmol/L (ref 3.5–5.1)
Sodium: 140 mmol/L (ref 135–145)
Total Bilirubin: 0.9 mg/dL (ref 0.3–1.2)
Total Protein: 7 g/dL (ref 6.5–8.1)

## 2015-10-08 LAB — URINALYSIS COMPLETE WITH MICROSCOPIC (ARMC ONLY)
BILIRUBIN URINE: NEGATIVE
Bacteria, UA: NONE SEEN
GLUCOSE, UA: NEGATIVE mg/dL
Ketones, ur: NEGATIVE mg/dL
NITRITE: NEGATIVE
Protein, ur: 100 mg/dL — AB
SPECIFIC GRAVITY, URINE: 1.018 (ref 1.005–1.030)
Squamous Epithelial / LPF: NONE SEEN
pH: 6 (ref 5.0–8.0)

## 2015-10-08 LAB — CBC WITH DIFFERENTIAL/PLATELET
Basophils Absolute: 0 10*3/uL (ref 0–0.1)
Basophils Relative: 1 %
EOS ABS: 0.2 10*3/uL (ref 0–0.7)
EOS PCT: 5 %
HCT: 41.8 % (ref 35.0–47.0)
Hemoglobin: 14.3 g/dL (ref 12.0–16.0)
LYMPHS ABS: 1 10*3/uL (ref 1.0–3.6)
LYMPHS PCT: 23 %
MCH: 32.7 pg (ref 26.0–34.0)
MCHC: 34.2 g/dL (ref 32.0–36.0)
MCV: 95.7 fL (ref 80.0–100.0)
MONOS PCT: 10 %
Monocytes Absolute: 0.5 10*3/uL (ref 0.2–0.9)
Neutro Abs: 2.7 10*3/uL (ref 1.4–6.5)
Neutrophils Relative %: 61 %
PLATELETS: 235 10*3/uL (ref 150–440)
RBC: 4.36 MIL/uL (ref 3.80–5.20)
RDW: 13.7 % (ref 11.5–14.5)
WBC: 4.4 10*3/uL (ref 3.6–11.0)

## 2015-10-08 MED ORDER — CIPROFLOXACIN HCL 500 MG PO TABS: 500.0000 mg | ORAL_TABLET | Freq: Two times a day (BID) | ORAL | 0 refills | Status: DC

## 2015-10-08 NOTE — Telephone Encounter (Signed)
Called to report that she is having red urination for past 3 weeks. She is very tired and run down feeling. Asking if it is coming form the medicine she is taking and if she can go back on the other medicine. She states she knows she should have called 3 weeks ago, but she thought it wold stop. Please advise

## 2015-10-08 NOTE — Telephone Encounter (Signed)
Patient to come in for lab today, she agrees to come in at 74

## 2015-10-09 LAB — URINE CULTURE

## 2015-10-12 ENCOUNTER — Telehealth: Payer: Self-pay | Admitting: *Deleted

## 2015-10-12 NOTE — Telephone Encounter (Signed)
Spoke with patient to f/u on UTI symptoms.  "my urine is clear now. I do not hurt when I urinate."  She has a few more dosages - approximately 3 days left of the Cipro. She states that she is scheduled to start back on the Eliquis tomorrow.  She inquired whether she "could go back on my other anticoagulants medications that I used in the past. I never had blood in my urine with the other medications."  I explained to the patient that the Eliquis has worked better to dissolve her previous clots.  Dr. Rogue Bussing does not want to put her back on the other anticoags meds at this time.  Teach back process performed.

## 2015-10-12 NOTE — Telephone Encounter (Signed)
-----   Message from Cammie Sickle, MD sent at 10/08/2015  4:53 PM EDT ----- Recommend cipro BID x7 days. Hold eliquis x 4 days. Please inform pt to call us back re: update in 1 week. Dr.B

## 2015-10-14 ENCOUNTER — Telehealth: Payer: Self-pay | Admitting: *Deleted

## 2015-10-14 ENCOUNTER — Telehealth: Payer: Self-pay | Admitting: Internal Medicine

## 2015-10-14 ENCOUNTER — Other Ambulatory Visit: Payer: Self-pay | Admitting: Internal Medicine

## 2015-10-14 DIAGNOSIS — C541 Malignant neoplasm of endometrium: Secondary | ICD-10-CM

## 2015-10-14 DIAGNOSIS — R319 Hematuria, unspecified: Secondary | ICD-10-CM

## 2015-10-14 DIAGNOSIS — C50912 Malignant neoplasm of unspecified site of left female breast: Secondary | ICD-10-CM

## 2015-10-14 DIAGNOSIS — E889 Metabolic disorder, unspecified: Secondary | ICD-10-CM

## 2015-10-14 DIAGNOSIS — M9080 Osteopathy in diseases classified elsewhere, unspecified site: Secondary | ICD-10-CM

## 2015-10-14 DIAGNOSIS — M898X9 Other specified disorders of bone, unspecified site: Secondary | ICD-10-CM

## 2015-10-14 MED ORDER — OXYCODONE-ACETAMINOPHEN 5-325 MG PO TABS: 1.0000 | ORAL_TABLET | ORAL | 0 refills | Status: DC | PRN

## 2015-10-14 MED ORDER — FENTANYL 50 MCG/HR TD PT72: 50.0000 ug | MEDICATED_PATCH | TRANSDERMAL | 0 refills | Status: DC

## 2015-10-14 NOTE — Telephone Encounter (Signed)
Paged Dr.Budzyn to discuss re: pt's hematuria; and the need to start Eliquis.

## 2015-10-14 NOTE — Telephone Encounter (Signed)
Spoke to pt- recommend 1 pill of eliquis a day; until further recommendations- check cbc/ua tomorrow. Traci [name]- please order above/appt for pt.

## 2015-10-14 NOTE — Telephone Encounter (Signed)
patient agrees to 10 AM appt tomorrow

## 2015-10-14 NOTE — Telephone Encounter (Signed)
Called to report that she was to restart her Eliquis today, but when she went to bathroom this morning "my urine was pure blood" Asking what she is to do and stated that she did not take Eliquis today. Also needing refill on her Fentanyl patch and Oxycodone.  Per Dr Rogue Bussing, hold Eliquis until he can speak with Dr Pilar Jarvis.  Patient repeated back to me that she will not take Eliquis until she hears back from [place]

## 2015-10-14 NOTE — Addendum Note (Signed)
Addended by: Betti Cruz on: 10/14/2015 02:32 PM   Modules accepted: Orders

## 2015-10-15 ENCOUNTER — Inpatient Hospital Stay: Payer: Medicare Other

## 2015-10-15 ENCOUNTER — Telehealth: Payer: Self-pay | Admitting: *Deleted

## 2015-10-15 ENCOUNTER — Other Ambulatory Visit: Payer: Self-pay

## 2015-10-15 DIAGNOSIS — C541 Malignant neoplasm of endometrium: Secondary | ICD-10-CM | POA: Diagnosis not present

## 2015-10-15 DIAGNOSIS — R319 Hematuria, unspecified: Secondary | ICD-10-CM

## 2015-10-15 LAB — CBC WITH DIFFERENTIAL/PLATELET
BASOS PCT: 1 %
Basophils Absolute: 0 10*3/uL (ref 0–0.1)
EOS ABS: 0.2 10*3/uL (ref 0–0.7)
EOS PCT: 5 %
HCT: 39.4 % (ref 35.0–47.0)
HEMOGLOBIN: 13.5 g/dL (ref 12.0–16.0)
LYMPHS ABS: 0.8 10*3/uL — AB (ref 1.0–3.6)
Lymphocytes Relative: 20 %
MCH: 32.9 pg (ref 26.0–34.0)
MCHC: 34.4 g/dL (ref 32.0–36.0)
MCV: 95.8 fL (ref 80.0–100.0)
MONO ABS: 0.4 10*3/uL (ref 0.2–0.9)
MONOS PCT: 11 %
NEUTROS PCT: 63 %
Neutro Abs: 2.5 10*3/uL (ref 1.4–6.5)
PLATELETS: 223 10*3/uL (ref 150–440)
RBC: 4.11 MIL/uL (ref 3.80–5.20)
RDW: 14.2 % (ref 11.5–14.5)
WBC: 4 10*3/uL (ref 3.6–11.0)

## 2015-10-15 LAB — URINALYSIS COMPLETE WITH MICROSCOPIC (ARMC ONLY)
Bacteria, UA: NONE SEEN
Specific Gravity, Urine: 1.023 (ref 1.005–1.030)
Squamous Epithelial / LPF: NONE SEEN

## 2015-10-15 NOTE — Telephone Encounter (Signed)
Patient called asking for results of PAP smear done by Dr D this morning. I explained that I think she has called the wrong office as our oncologists not do PAP smears and Dr D does not work here. She stated she came to the cancer center this morning and had labs done. After questioning her she stated the Dr that took Dr [facility] place did labs on her. I then realized she was talking about the cbc and UA done this morning and per VO Dr Rogue Bussing she is to take Eliquis 1 pill a day and come back in  Friday for CBC check again. She is to have stent removed by Dr Pilar Jarvis. She reported she is having that done 9/28 and she asked for an appt time of 815 on 9/15. She was hesitant to restart Eliquis, but has agreed to take 1 pill a day

## 2015-10-22 ENCOUNTER — Telehealth: Payer: Self-pay

## 2015-10-22 ENCOUNTER — Inpatient Hospital Stay: Payer: Medicare Other

## 2015-10-22 DIAGNOSIS — R319 Hematuria, unspecified: Secondary | ICD-10-CM

## 2015-10-22 DIAGNOSIS — C541 Malignant neoplasm of endometrium: Secondary | ICD-10-CM | POA: Diagnosis not present

## 2015-10-22 LAB — CBC WITH DIFFERENTIAL/PLATELET
Basophils Absolute: 0 10*3/uL (ref 0–0.1)
Basophils Relative: 1 %
Eosinophils Absolute: 0.2 10*3/uL (ref 0–0.7)
Eosinophils Relative: 5 %
HEMATOCRIT: 37 % (ref 35.0–47.0)
Hemoglobin: 12.9 g/dL (ref 12.0–16.0)
LYMPHS PCT: 20 %
Lymphs Abs: 0.9 10*3/uL — ABNORMAL LOW (ref 1.0–3.6)
MCH: 33.1 pg (ref 26.0–34.0)
MCHC: 34.7 g/dL (ref 32.0–36.0)
MCV: 95.4 fL (ref 80.0–100.0)
MONO ABS: 0.5 10*3/uL (ref 0.2–0.9)
MONOS PCT: 10 %
NEUTROS ABS: 2.9 10*3/uL (ref 1.4–6.5)
Neutrophils Relative %: 64 %
Platelets: 232 10*3/uL (ref 150–440)
RBC: 3.88 MIL/uL (ref 3.80–5.20)
RDW: 13.5 % (ref 11.5–14.5)
WBC: 4.6 10*3/uL (ref 3.6–11.0)

## 2015-10-22 NOTE — Telephone Encounter (Signed)
Called pt no answer left VM to call back to discuss labs.

## 2015-10-25 NOTE — Telephone Encounter (Signed)
-----   Message from Cammie Sickle, MD sent at 10/22/2015  4:31 PM EDT ----- Please inform patient that hemoglobin is stable; continue Eliquis at least one pill a day.

## 2015-10-25 NOTE — Telephone Encounter (Signed)
Attempted to contact patient. Left vm to have patient call cancer ctr to discuss results and tx plan.

## 2015-10-27 NOTE — Telephone Encounter (Signed)
Attempted to contact patient. X 2 this morning. Left vm on cell phone of husband.  No answer on home ph. Left vm to have patient call cancer ctr to discuss results and tx plan for eliquis mgmt.

## 2015-11-01 ENCOUNTER — Inpatient Hospital Stay: Payer: Medicare Other

## 2015-11-01 ENCOUNTER — Encounter: Payer: Self-pay | Admitting: Internal Medicine

## 2015-11-01 ENCOUNTER — Inpatient Hospital Stay (HOSPITAL_BASED_OUTPATIENT_CLINIC_OR_DEPARTMENT_OTHER): Payer: Medicare Other | Admitting: Internal Medicine

## 2015-11-01 VITALS — BP 133/75 | HR 56 | Temp 95.9°F | Ht 63.0 in | Wt 123.8 lb

## 2015-11-01 DIAGNOSIS — Z90710 Acquired absence of both cervix and uterus: Secondary | ICD-10-CM

## 2015-11-01 DIAGNOSIS — M25551 Pain in right hip: Secondary | ICD-10-CM

## 2015-11-01 DIAGNOSIS — C541 Malignant neoplasm of endometrium: Secondary | ICD-10-CM

## 2015-11-01 DIAGNOSIS — C50812 Malignant neoplasm of overlapping sites of left female breast: Secondary | ICD-10-CM

## 2015-11-01 DIAGNOSIS — Z170 Estrogen receptor positive status [ER+]: Secondary | ICD-10-CM

## 2015-11-01 DIAGNOSIS — Z7901 Long term (current) use of anticoagulants: Secondary | ICD-10-CM

## 2015-11-01 DIAGNOSIS — C7951 Secondary malignant neoplasm of bone: Secondary | ICD-10-CM

## 2015-11-01 DIAGNOSIS — I82402 Acute embolism and thrombosis of unspecified deep veins of left lower extremity: Secondary | ICD-10-CM

## 2015-11-01 DIAGNOSIS — G8929 Other chronic pain: Secondary | ICD-10-CM

## 2015-11-01 DIAGNOSIS — Z79818 Long term (current) use of other agents affecting estrogen receptors and estrogen levels: Secondary | ICD-10-CM

## 2015-11-01 DIAGNOSIS — R319 Hematuria, unspecified: Secondary | ICD-10-CM

## 2015-11-01 DIAGNOSIS — Z8673 Personal history of transient ischemic attack (TIA), and cerebral infarction without residual deficits: Secondary | ICD-10-CM

## 2015-11-01 DIAGNOSIS — C50912 Malignant neoplasm of unspecified site of left female breast: Secondary | ICD-10-CM

## 2015-11-01 DIAGNOSIS — Z86711 Personal history of pulmonary embolism: Secondary | ICD-10-CM

## 2015-11-01 DIAGNOSIS — Z923 Personal history of irradiation: Secondary | ICD-10-CM

## 2015-11-01 DIAGNOSIS — C7911 Secondary malignant neoplasm of bladder: Secondary | ICD-10-CM | POA: Diagnosis not present

## 2015-11-01 DIAGNOSIS — Z86718 Personal history of other venous thrombosis and embolism: Secondary | ICD-10-CM

## 2015-11-01 DIAGNOSIS — Z9012 Acquired absence of left breast and nipple: Secondary | ICD-10-CM

## 2015-11-01 DIAGNOSIS — Z90722 Acquired absence of ovaries, bilateral: Secondary | ICD-10-CM

## 2015-11-01 LAB — COMPREHENSIVE METABOLIC PANEL
ALBUMIN: 3.7 g/dL (ref 3.5–5.0)
ALT: 12 U/L — ABNORMAL LOW (ref 14–54)
AST: 19 U/L (ref 15–41)
Alkaline Phosphatase: 30 U/L — ABNORMAL LOW (ref 38–126)
Anion gap: 6 (ref 5–15)
BILIRUBIN TOTAL: 0.6 mg/dL (ref 0.3–1.2)
BUN: 23 mg/dL — AB (ref 6–20)
CO2: 24 mmol/L (ref 22–32)
Calcium: 8.7 mg/dL — ABNORMAL LOW (ref 8.9–10.3)
Chloride: 111 mmol/L (ref 101–111)
Creatinine, Ser: 1.14 mg/dL — ABNORMAL HIGH (ref 0.44–1.00)
GFR calc Af Amer: 50 mL/min — ABNORMAL LOW (ref >60)
GFR calc non Af Amer: 43 mL/min — ABNORMAL LOW (ref >60)
GLUCOSE: 108 mg/dL — AB (ref 65–99)
POTASSIUM: 3.9 mmol/L (ref 3.5–5.1)
SODIUM: 141 mmol/L (ref 135–145)
TOTAL PROTEIN: 6.4 g/dL — AB (ref 6.5–8.1)

## 2015-11-01 LAB — CBC WITH DIFFERENTIAL/PLATELET
BASOS ABS: 0 10*3/uL (ref 0–0.1)
Basophils Relative: 1 %
EOS PCT: 3 %
Eosinophils Absolute: 0.2 10*3/uL (ref 0–0.7)
HEMATOCRIT: 39.8 % (ref 35.0–47.0)
Hemoglobin: 13.6 g/dL (ref 12.0–16.0)
LYMPHS ABS: 1.1 10*3/uL (ref 1.0–3.6)
LYMPHS PCT: 16 %
MCH: 32.7 pg (ref 26.0–34.0)
MCHC: 34.3 g/dL (ref 32.0–36.0)
MCV: 95.6 fL (ref 80.0–100.0)
MONO ABS: 0.7 10*3/uL (ref 0.2–0.9)
MONOS PCT: 10 %
Neutro Abs: 4.9 10*3/uL (ref 1.4–6.5)
Neutrophils Relative %: 70 %
PLATELETS: 234 10*3/uL (ref 150–440)
RBC: 4.16 MIL/uL (ref 3.80–5.20)
RDW: 14.1 % (ref 11.5–14.5)
WBC: 6.8 10*3/uL (ref 3.6–11.0)

## 2015-11-01 MED ORDER — DENOSUMAB 120 MG/1.7ML ~~LOC~~ SOLN
120.0000 mg | Freq: Once | SUBCUTANEOUS | Status: AC
  Administered 2015-11-01: 120 mg via SUBCUTANEOUS
  Filled 2015-11-01: qty 1.7

## 2015-11-01 NOTE — Assessment & Plan Note (Addendum)
Metastatic endometrial cancer to the bone- currently on Megace. Tolerating it well. Continue Megace. PET- AUG 22nd- Improved. wil order CT at next visit for mid-November.   # Swelling of the left lower extremity; Aug 2017- Dopplers of the lower extremity DVT extensive; currently improved. [see discussion below]. Continue Eliquis  5 mg once a day  # Hematuria- ? Etiology awaiting urology consult this week. Continue eliquis 5 mg/day. Hemoglobin stable 13.   # Metastatic lesions to the bone on X-geva- every 4 weeks.   # right hip pain [met to bone]- on Fentanyl/ percocet continue.   # Follow-up in 4 weeks; CBC CMP/X-geva in 4 weeks.

## 2015-11-01 NOTE — Progress Notes (Signed)
Pt reports that she takes Eliquis there is blood in urine and PCP is aware and when she doesn't take Eliquis she has no bleeding.

## 2015-11-01 NOTE — Progress Notes (Signed)
[facility] OFFICE PROGRESS NOTE  Patient Care Team: Idelle Crouch, MD as PCP - General (Unknown Physician Specialty) [name], MD as Referring Physician (Surgery) [name], MD as Consulting Physician (Urology)  Cancer of left breast [facility])   Staging form: Breast, AJCC 7th Edition     Clinical: Stage IA (T1b, N0, M0) - Signed by Forest Gleason, MD on 10/20/2014    Oncology History   . Patient has a previous history of endometrium carcinoma of endometrium stage IB disease status post bilateral salpingo-oophorectomy and radiation therapy 4. Right hydronephrosis detected on MRI scan off lumbosacral spine. Cystoscopy with attempted stent placement revealed bladder tumor in September of 2016 biopsies positive for recurrent endometrial cancer  5. Patient had stent placement in the right kidney Started radiation therapy 6. Patient has finished radiation therapy with relief in the pain (November, 2016) 7. Started on Megace in December of 2016 8 deep vein thrombosis in the left lower extremity (February 08, 2014) Hematuria due to xeralto IVC filter placement on February 11, 2015 She is off xeralto 9.condition recently had a cystoscopy no evidence of bleeding was found so patient has been started on ELOQUIS (March 15, 2015)  10. Patient again had a significant bleeding with hematuria with eloquis and has been discontinued; Patient is on Plavix. ON  Megace. AUG 2017- PET IMPROVED; mediastinal uptake- monitor for now.   # August 25 2015- LLE DVT- start Eliquis [stop plavix];   FEB 2017-LEFT BREAST CA [9'0 clock- overlapping site] Estrogen receptor positive. Progesterone receptor positive. HER-2 receptor; Her 2neg; 5 mm tumor clinically stage IB N0 M0 tumor;s/p Lumpectomy; NO RT  # X-geva q 4 w  # MOLECULAR STUDIES: 08/23/15- MMR-ordered.      Cancer of left breast ([place])   10/20/2014 Initial Diagnosis    Cancer of left breast        Endometrial cancer ([place])   11/18/2014 Initial Diagnosis    Endometrial cancer ([place])        INTERVAL HISTORY:  Traci [name] 82 y.o.  female [place] patient above history of Metastatic endometrial cancer currently on Megace- Is here for follow-up.  Patient continues to complain of bleeding while urination. Denies any burning pain.   Patient has mild pain in the right hip; however not any worse. She is on pain patch. No recent falls.  Patient denies any abdominal pain. Denies any new onset of bone pain. Appetite is good. Denies any jaw pain. No chest pain shortness of the cough.   REVIEW OF SYSTEMS:  A complete 10 point review of system is done which is negative except mentioned above/history of present illness.   PAST MEDICAL HISTORY :  Past Medical History:  Diagnosis Date  . Arthritis    Degenerative Disc Disease  . Bone cancer ([place])   . Breast cancer ([place]) 2016   LT LUMPECTOMY 03-2015  . Chronic kidney disease    HYDRONEPHROSIS  . Deep vein blood clot of left lower extremity ([place]) February 11, 2015   Left Leg  . Endometrial adenocarcinoma ([place]) 01/2012    stage Ib, grade 1, positive peritoneal cytology, no other risk factors or extra-uterine disease  . Gastric ulcer   . GERD (gastroesophageal reflux disease)   . Hearing loss   . Hiccups   . History of brachytherapy   . History of colon polyps   . History of DVT (deep vein thrombosis)   . History of esophageal stricture   . History of  shingles   . Inguinal lymphocyst   . Mild aphasia   . Pain    SEVERE CHRONIC RIGHT LEG,HIP,FOOT  . PE (pulmonary embolism)    H/O  . Stroke ([place]) 04/06/2011   residual:  aphasia    PAST SURGICAL HISTORY :   Past Surgical History:  Procedure Laterality Date  . ABDOMINAL HYSTERECTOMY  December 2013   history of cervical cancer  . BACK SURGERY     fusion of L3&4  . BREAST BIOPSY Left 10/13/2014   POS  . CATARACT EXTRACTION W/ INTRAOCULAR LENS IMPLANT     right  . CYSTOSCOPY  W/ RETROGRADES Right 10/28/2014   Procedure: CYSTOSCOPY WITH RETROGRADE PYELOGRAM ATTEMPT, UNABLE TO PASS ;  Surgeon: [name], MD;  Location: [facility];  Service: Urology;  Laterality: Right;  . CYSTOSCOPY W/ RETROGRADES Right 09/15/2015   Procedure: CYSTOSCOPY WITH RETROGRADE PYELOGRAM;  Surgeon: [name], MD;  Location: [facility];  Service: Urology;  Laterality: Right;  . CYSTOSCOPY W/ URETERAL STENT PLACEMENT Right 03/10/2015   Procedure: CYSTOSCOPY WITH STENT REPLACEMENT;  Surgeon: [name], MD;  Location: [facility];  Service: Urology;  Laterality: Right;  . CYSTOSCOPY W/ URETERAL STENT PLACEMENT Right 06/09/2015   Procedure: CYSTOSCOPY WITH STENT REPLACEMENT;  Surgeon: [name], MD;  Location: [facility];  Service: Urology;  Laterality: Right;  . CYSTOSCOPY W/ URETERAL STENT PLACEMENT Right 09/15/2015   Procedure: CYSTOSCOPY WITH STENT REPLACEMENT;  Surgeon: [name], MD;  Location: [facility];  Service: Urology;  Laterality: Right;  . CYSTOSCOPY WITH STENT PLACEMENT Right 10/28/2014   Procedure: BLADDER BIOPSY WITH FULGERATION ;  Surgeon: [name], MD;  Location: [facility];  Service: Urology;  Laterality: Right;  . ESOPHAGEAL DILATION    . EYE SURGERY Bilateral    Cataract Extraction  . PARTIAL MASTECTOMY WITH NEEDLE LOCALIZATION Left 03/30/2015   Procedure: PARTIAL MASTECTOMY WITH NEEDLE LOCALIZATION;  Surgeon: [name], MD;  Location: [facility];  Service: General;  Laterality: Left;  . PERIPHERAL VASCULAR CATHETERIZATION N/A 02/11/2015   Procedure: IVC Filter Insertion;  Surgeon: Algernon Huxley, MD;  Location: [place] [facility];  Service: Cardiovascular;  Laterality: N/A;  . PORTACATH PLACEMENT Right 11/26/2014   Procedure: INSERTION PORT-A-CATH;  Surgeon: [name], MD;  Location: [facility];  Service: General;  Laterality: Right;  . SENTINEL NODE BIOPSY Left 03/30/2015   Procedure: SENTINEL NODE BIOPSY;  Surgeon: [name], MD;  Location: [facility];  Service: General;  Laterality: Left;  . thyroid removed    . THYROIDECTOMY, PARTIAL    . TONSILLECTOMY    . TOTAL ABDOMINAL HYSTERECTOMY W/ BILATERAL SALPINGOOPHORECTOMY Bilateral    staging biopsies    FAMILY HISTORY :   Family History  Problem Relation Age of Onset  . Stroke Mother   . Breast cancer Maternal Aunt     x 2    SOCIAL HISTORY:   Social History  Substance Use Topics  . Smoking status: Never Smoker  . Smokeless tobacco: Never Used  . Alcohol use No    ALLERGIES:  is allergic to penicillins; hydrocodone-acetaminophen; novocain [procaine]; and sulfa antibiotics.  MEDICATIONS:  Current Outpatient Prescriptions  Medication Sig Dispense Refill  . apixaban (ELIQUIS) 5 MG TABS tablet Take 1 tablet (5 mg total) by mouth 2 (two) times daily. 60 tablet 5  . fentaNYL (DURAGESIC - DOSED MCG/HR) 50 MCG/HR Place 1 patch (50 mcg total) onto the skin every 3 (three) days. 10  patch 0  . gabapentin (NEURONTIN) 300 MG capsule Take 1 capsule (300 mg total) by mouth 3 (three) times daily. 90 capsule 2  . megestrol (MEGACE) 40 MG tablet Take 1 tablet (40 mg total) by mouth 4 (four) times daily. 120 tablet 3  . Multiple Vitamins-Minerals (MULTIVITAMIN WITH MINERALS) tablet Take 1 tablet by mouth daily. Reported on 06/30/2015    . oxyCODONE-acetaminophen (PERCOCET/ROXICET) 5-325 MG tablet Take 1-2 tablets by mouth every 4 (four) hours as needed for severe pain. 90 tablet 0   No current facility-administered medications for this visit.     PHYSICAL EXAMINATION: ECOG PERFORMANCE STATUS: 1 - Symptomatic but completely ambulatory  BP 133/75 (BP Location: Left Arm, Patient Position: Sitting)   Pulse (!) 56   Temp (!) 95.9 F (35.5 C) (Tympanic)   Ht 5\' 3"  (1.6 m)   Wt 123 lb 12.8 oz (56.2 kg)   BMI 21.93 kg/m   Filed Weights   11/01/15 1050  Weight: 123 lb 12.8 oz (56.2 kg)    GENERAL: 11/03/15 Caucasian female patient walking herself  Alert, no distress and comfortable. With her husband/son EYES: no pallor or icterus OROPHARYNX: no thrush or ulceration; good dentition  NECK: supple, no masses felt LYMPH:  no palpable lymphadenopathy in the cervical, axillary or inguinal regions LUNGS: clear to auscultation and  No wheeze or crackles HEART/CVS: regular rate & rhythm and no murmurs; Left lower extremity swollen compared to the right.  ABDOMEN:abdomen soft, non-tender and normal bowel sounds Musculoskeletal:no cyanosis of digits and no clubbing  PSYCH: alert & oriented x 3 with fluent speech NEURO: no focal motor/sensory deficits SKIN:  no rashes or significant lesions  LABORATORY DATA:  I have reviewed the data as listed    Component Value Date/Time   NA 141 11/01/2015 1019   NA 139 11/04/2013 1257   K 3.9 11/01/2015 1019   K 4.3 03/05/2014 1156   CL 111 11/01/2015 1019   CL 106 11/04/2013 1257   CO2 24 11/01/2015 1019   CO2 26 11/04/2013 1257   GLUCOSE 108 (H) 11/01/2015 1019   GLUCOSE 96 11/04/2013 1257   BUN 23 (H) 11/01/2015 1019   BUN 13 11/04/2013 1257   CREATININE 1.14 (H) 11/01/2015 1019   CREATININE 0.90 11/04/2013 1257   CALCIUM 8.7 (L) 11/01/2015 1019   CALCIUM 8.5 11/04/2013 1257   PROT 6.4 (L) 11/01/2015 1019   PROT 6.8 11/04/2013 1257   ALBUMIN 3.7 11/01/2015 1019   ALBUMIN 3.5 11/04/2013 1257   AST 19 11/01/2015 1019   AST 25 11/04/2013 1257   ALT 12 (L) 11/01/2015 1019   ALT 20 11/04/2013 1257   ALKPHOS 30 (L) 11/01/2015 1019   ALKPHOS 64 11/04/2013 1257   BILITOT 0.6 11/01/2015 1019   BILITOT 0.5 11/04/2013 1257   GFRNONAA 43 (L) 11/01/2015 1019   GFRNONAA >60 11/04/2013 1257   GFRNONAA >60 03/29/2012 1650   GFRAA 50 (L) 11/01/2015 1019   GFRAA >60 11/04/2013 1257   GFRAA >60 03/29/2012 1650    No results found for: SPEP, UPEP  Lab Results  Component Value Date   WBC 6.8 11/01/2015   NEUTROABS 4.9 11/01/2015   HGB 13.6 11/01/2015   HCT 39.8 11/01/2015   MCV 95.6  11/01/2015   PLT 234 11/01/2015      Chemistry      Component Value Date/Time   NA 141 11/01/2015 1019   NA 139 11/04/2013 1257   K 3.9 11/01/2015 1019   K 4.3 03/05/2014  1156   CL 111 11/01/2015 1019   CL 106 11/04/2013 1257   CO2 24 11/01/2015 1019   CO2 26 11/04/2013 1257   BUN 23 (H) 11/01/2015 1019   BUN 13 11/04/2013 1257   CREATININE 1.14 (H) 11/01/2015 1019   CREATININE 0.90 11/04/2013 1257      Component Value Date/Time   CALCIUM 8.7 (L) 11/01/2015 1019   CALCIUM 8.5 11/04/2013 1257   ALKPHOS 30 (L) 11/01/2015 1019   ALKPHOS 64 11/04/2013 1257   AST 19 11/01/2015 1019   AST 25 11/04/2013 1257   ALT 12 (L) 11/01/2015 1019   ALT 20 11/04/2013 1257   BILITOT 0.6 11/01/2015 1019   BILITOT 0.5 11/04/2013 1257       RADIOGRAPHIC STUDIES: I have personally reviewed the radiological images as listed and agreed with the findings in the report. No results found.   ASSESSMENT & PLAN:  Endometrial cancer ([place]) Metastatic endometrial cancer to the bone- currently on Megace. Tolerating it well. Continue Megace. PET- AUG 22nd- Improved. wil order CT at next visit for mid-November.   # Swelling of the left lower extremity; Aug 2017- Dopplers of the lower extremity DVT extensive; currently improved. [see discussion below]. Continue Eliquis  5 mg once a day  # Hematuria- ? Etiology awaiting urology consult this week. Continue eliquis 5 mg/day. Hemoglobin stable 13.   # Metastatic lesions to the bone on X-geva- every 4 weeks.   # right hip pain [met to bone]- on Fentanyl/ percocet continue.   # Follow-up in 4 weeks; CBC CMP/X-geva in 4 weeks.   Orders Placed This Encounter  Procedures  . CBC with Differential    Standing Status:   Future    Standing Expiration Date:   10/31/2016  . Comprehensive metabolic panel    Standing Status:   Future    Standing Expiration Date:   10/31/2016  . CA 125    Standing Status:   Future    Standing Expiration Date:   10/31/2016    All questions were answered. The patient knows to call the clinic with any problems, questions or concerns.      Cammie Sickle, MD 11/02/2015 8:43 AM

## 2015-11-04 ENCOUNTER — Ambulatory Visit (INDEPENDENT_AMBULATORY_CARE_PROVIDER_SITE_OTHER): Payer: Medicare Other | Admitting: Urology

## 2015-11-04 ENCOUNTER — Encounter: Payer: Self-pay | Admitting: Urology

## 2015-11-04 VITALS — BP 127/67 | HR 61 | Ht 62.0 in | Wt 121.9 lb

## 2015-11-04 DIAGNOSIS — N1339 Other hydronephrosis: Secondary | ICD-10-CM

## 2015-11-04 DIAGNOSIS — Z960 Presence of urogenital implants: Secondary | ICD-10-CM | POA: Diagnosis not present

## 2015-11-04 DIAGNOSIS — C541 Malignant neoplasm of endometrium: Secondary | ICD-10-CM

## 2015-11-04 DIAGNOSIS — R310 Gross hematuria: Secondary | ICD-10-CM

## 2015-11-04 LAB — URINALYSIS, COMPLETE

## 2015-11-04 LAB — MICROSCOPIC EXAMINATION
BACTERIA UA: NONE SEEN
EPITHELIAL CELLS (NON RENAL): NONE SEEN /HPF (ref 0–10)
RBC, UA: >30 /hpf — AB (ref 0–?)
WBC, UA: NONE SEEN /hpf (ref 0–?)

## 2015-11-04 MED ORDER — LIDOCAINE HCL 2 % EX GEL
1.0000 "application " | Freq: Once | CUTANEOUS | Status: AC
  Administered 2015-11-04: 1 via URETHRAL

## 2015-11-04 MED ORDER — CIPROFLOXACIN HCL 500 MG PO TABS
500.0000 mg | ORAL_TABLET | Freq: Once | ORAL | Status: AC
  Administered 2015-11-04: 500 mg via ORAL

## 2015-11-04 NOTE — Progress Notes (Signed)
11/04/2015 12:31 PM   Traci [name] December 05, 1929 [id number]  Referring provider: Idelle Crouch, MD [facility] [place], [place] [address]  Chief Complaint  Patient presents with  . Cysto    hydronephrosis w/ ureteral stricture    HPI: The patient is an 82 year old female with a past medical history for metastatic endometrial cancer causing right hydronephrosis that is managed with a right ureteral stent. Her right kidney has appeared atrophic over the stent was placed because of her right flank pain and has been exchanged couple of times.She has had increasing bouts of hematuria since her stent was last exchanged. At this point, after long discussion has been we have decided discontinue her ureteral stent As her kidney has minimal function and the stent is causing significant hematuria.. She presents today for stent removal.   PMH: Past Medical History:  Diagnosis Date  . Arthritis    Degenerative Disc Disease  . Bone cancer ([place])   . Breast cancer ([place]) 2016   LT LUMPECTOMY 03-2015  . Chronic kidney disease    HYDRONEPHROSIS  . Deep vein blood clot of left lower extremity ([place]) February 11, 2015   Left Leg  . Endometrial adenocarcinoma ([place]) 01/2012    stage Ib, grade 1, positive peritoneal cytology, no other risk factors or extra-uterine disease  . Gastric ulcer   . GERD (gastroesophageal reflux disease)   . Hearing loss   . Hiccups   . History of brachytherapy   . History of colon polyps   . History of DVT (deep vein thrombosis)   . History of esophageal stricture   . History of shingles   . Inguinal lymphocyst   . Mild aphasia   . Pain    SEVERE CHRONIC RIGHT LEG,HIP,FOOT  . PE (pulmonary embolism)    H/O  . Stroke [facility]) 04/06/2011   residual:  aphasia    Surgical History: Past Surgical History:  Procedure Laterality Date  . ABDOMINAL HYSTERECTOMY  December 2013   history of cervical cancer  . BACK SURGERY     fusion of L3&4    . BREAST BIOPSY Left 10/13/2014   POS  . CATARACT EXTRACTION W/ INTRAOCULAR LENS IMPLANT     right  . CYSTOSCOPY W/ RETROGRADES Right 10/28/2014   Procedure: CYSTOSCOPY WITH RETROGRADE PYELOGRAM ATTEMPT, UNABLE TO PASS ;  Surgeon: [name], MD;  Location: [facility];  Service: Urology;  Laterality: Right;  . CYSTOSCOPY W/ RETROGRADES Right 09/15/2015   Procedure: CYSTOSCOPY WITH RETROGRADE PYELOGRAM;  Surgeon: [name], MD;  Location: [facility];  Service: Urology;  Laterality: Right;  . CYSTOSCOPY W/ URETERAL STENT PLACEMENT Right 03/10/2015   Procedure: CYSTOSCOPY WITH STENT REPLACEMENT;  Surgeon: [name], MD;  Location: [facility];  Service: Urology;  Laterality: Right;  . CYSTOSCOPY W/ URETERAL STENT PLACEMENT Right 06/09/2015   Procedure: CYSTOSCOPY WITH STENT REPLACEMENT;  Surgeon: [name], MD;  Location: [facility];  Service: Urology;  Laterality: Right;  . CYSTOSCOPY W/ URETERAL STENT PLACEMENT Right 09/15/2015   Procedure: CYSTOSCOPY WITH STENT REPLACEMENT;  Surgeon: [name], MD;  Location: [facility];  Service: Urology;  Laterality: Right;  . CYSTOSCOPY WITH STENT PLACEMENT Right 10/28/2014   Procedure: BLADDER BIOPSY WITH FULGERATION ;  Surgeon: [name], MD;  Location: [facility];  Service: Urology;  Laterality: Right;  . ESOPHAGEAL DILATION    . EYE SURGERY Bilateral    Cataract Extraction  . PARTIAL MASTECTOMY WITH NEEDLE  LOCALIZATION Left 03/30/2015   Procedure: PARTIAL MASTECTOMY WITH NEEDLE LOCALIZATION;  Surgeon: [name], MD;  Location: [facility];  Service: General;  Laterality: Left;  . PERIPHERAL VASCULAR CATHETERIZATION N/A 02/11/2015   Procedure: IVC Filter Insertion;  Surgeon: Algernon Huxley, MD;  Location: [place] [facility];  Service: Cardiovascular;  Laterality: N/A;  . PORTACATH PLACEMENT Right 11/26/2014   Procedure: INSERTION PORT-A-CATH;  Surgeon: [name], MD;  Location: [facility];  Service: General;   Laterality: Right;  . SENTINEL NODE BIOPSY Left 03/30/2015   Procedure: SENTINEL NODE BIOPSY;  Surgeon: [name], MD;  Location: [facility];  Service: General;  Laterality: Left;  . thyroid removed    . THYROIDECTOMY, PARTIAL    . TONSILLECTOMY    . TOTAL ABDOMINAL HYSTERECTOMY W/ BILATERAL SALPINGOOPHORECTOMY Bilateral    staging biopsies    Home Medications:    Medication List       Accurate as of 11/04/15 12:31 PM. Always use your most recent med list.          apixaban 5 MG Tabs tablet Commonly known as:  ELIQUIS Take 1 tablet (5 mg total) by mouth 2 (two) times daily.   fentaNYL 50 MCG/HR Commonly known as:  DURAGESIC - dosed mcg/hr Place 1 patch (50 mcg total) onto the skin every 3 (three) days.   gabapentin 300 MG capsule Commonly known as:  NEURONTIN Take 1 capsule (300 mg total) by mouth 3 (three) times daily.   megestrol 40 MG tablet Commonly known as:  MEGACE Take 1 tablet (40 mg total) by mouth 4 (four) times daily.   multivitamin with minerals tablet Take 1 tablet by mouth daily. Reported on 06/30/2015   ofloxacin 0.3 % otic solution Commonly known as:  FLOXIN   oxyCODONE-acetaminophen 5-325 MG tablet Commonly known as:  PERCOCET/ROXICET Take 1-2 tablets by mouth every 4 (four) hours as needed for severe pain.       Allergies:  Allergies  Allergen Reactions  . Penicillins Rash  . Acetaminophen     Other reaction(s): Unknown  . Hydrocodone-Acetaminophen Nausea Only  . Novocain [Procaine] Other (See Comments) and Nausea And Vomiting    Other reaction(s): Unknown Chest pain  . Sulfa Antibiotics Other (See Comments)    CHEST PAIN    Family History: Family History  Problem Relation Age of Onset  . Stroke Mother   . Breast cancer Maternal Aunt     x 2    Social History:  reports that she has never smoked. She has never used smokeless tobacco. She reports that she does not drink alcohol or use drugs.  ROS:                                         Physical Exam: BP 127/67   Pulse 61   Ht 5\' 2"  (1.575 m)   Wt 121 lb 14.4 oz (55.3 kg)   BMI 22.30 kg/m   Constitutional:  Alert and oriented, No acute distress. HEENT: [place] AT, moist mucus membranes.  Trachea midline, no masses. Cardiovascular: No clubbing, cyanosis, or edema. Respiratory: Normal respiratory effort, no increased work of breathing. GI: Abdomen is soft, nontender, nondistended, no abdominal masses GU: No CVA tenderness.  Skin: No rashes, bruises or suspicious lesions. Lymph: No cervical or inguinal adenopathy. Neurologic: Grossly intact, no focal deficits, moving all 4 extremities. Psychiatric: Normal mood and affect.  Laboratory Data: Lab Results  Component Value Date   WBC 6.8 11/01/2015   HGB 13.6 11/01/2015   HCT 39.8 11/01/2015   MCV 95.6 11/01/2015   PLT 234 11/01/2015    Lab Results  Component Value Date   CREATININE 1.14 (H) 11/01/2015    No results found for: PSA  No results found for: TESTOSTERONE  Lab Results  Component Value Date   HGBA1C 5.5 04/08/2011    Urinalysis    Component Value Date/Time   COLORURINE RED (A) 10/15/2015 1010   APPEARANCEUR TURBID (A) 10/15/2015 1010   APPEARANCEUR Hazy (A) 09/08/2015 1128   LABSPEC 1.023 10/15/2015 1010   LABSPEC 1.023 03/29/2012 1930   PHURINE  10/15/2015 1010    TEST NOT REPORTED DUE TO COLOR INTERFERENCE OF URINE PIGMENT   GLUCOSEU (A) 10/15/2015 1010    TEST NOT REPORTED DUE TO COLOR INTERFERENCE OF URINE PIGMENT   GLUCOSEU 50 mg/dL 03/29/2012 1930   HGBUR (A) 10/15/2015 1010    TEST NOT REPORTED DUE TO COLOR INTERFERENCE OF URINE PIGMENT   BILIRUBINUR (A) 10/15/2015 1010    TEST NOT REPORTED DUE TO COLOR INTERFERENCE OF URINE PIGMENT   BILIRUBINUR Negative 09/08/2015 1128   BILIRUBINUR Negative 03/29/2012 1930   KETONESUR (A) 10/15/2015 1010    TEST NOT REPORTED DUE TO COLOR INTERFERENCE OF URINE PIGMENT   PROTEINUR (A) 10/15/2015 1010    TEST  NOT REPORTED DUE TO COLOR INTERFERENCE OF URINE PIGMENT   NITRITE (A) 10/15/2015 1010    TEST NOT REPORTED DUE TO COLOR INTERFERENCE OF URINE PIGMENT   LEUKOCYTESUR (A) 10/15/2015 1010    TEST NOT REPORTED DUE TO COLOR INTERFERENCE OF URINE PIGMENT   LEUKOCYTESUR 1+ (A) 09/08/2015 1128   LEUKOCYTESUR Negative 03/29/2012 1930     Cystoscopy Procedure Note  Patient identification was confirmed, informed consent was obtained, and patient was prepped using Betadine solution.  Lidocaine jelly was administered per urethral meatus.    Preoperative abx where received prior to procedure.    Procedure: - Flexible cystoscope introduced, without any difficulty.   - Normal mucosa. No tumors. - Right ureteral stent removed intact with flexible graspers.   Post-Procedure: - Patient tolerated the procedure well   Assessment & Plan:    1. Right Hydronephrosis 2. Retained right ureteral stent 3. Metastatic endometrial carcinoma 4. Gross hematuria -Right ureteral stent removed today. This should resolved her hematuria. No tumors visualized within the bladder on cystoscopy today. No further urological interventions indicated at this time. She can follow-up with [place] when necessary.  Return if symptoms worsen or fail to improve.  [name], MD  [facility] [address] [place], [place] [address] [phone]

## 2015-11-08 ENCOUNTER — Other Ambulatory Visit: Payer: Self-pay | Admitting: *Deleted

## 2015-11-08 DIAGNOSIS — C541 Malignant neoplasm of endometrium: Secondary | ICD-10-CM

## 2015-11-08 DIAGNOSIS — M898X9 Other specified disorders of bone, unspecified site: Secondary | ICD-10-CM

## 2015-11-08 DIAGNOSIS — M9080 Osteopathy in diseases classified elsewhere, unspecified site: Secondary | ICD-10-CM

## 2015-11-08 DIAGNOSIS — E889 Metabolic disorder, unspecified: Secondary | ICD-10-CM

## 2015-11-08 MED ORDER — OXYCODONE-ACETAMINOPHEN 5-325 MG PO TABS: 1.0000 | ORAL_TABLET | ORAL | 0 refills | Status: DC | PRN

## 2015-11-08 NOTE — Telephone Encounter (Signed)
Patient called and left msg on my VM that she needs 2 prescriptions refilled and only left RX number, but not name of drugs. I called her back and had to leave a message to call me back with the names of medicine she needs refilled

## 2015-11-29 ENCOUNTER — Inpatient Hospital Stay (HOSPITAL_BASED_OUTPATIENT_CLINIC_OR_DEPARTMENT_OTHER): Payer: Medicare Other | Admitting: Internal Medicine

## 2015-11-29 ENCOUNTER — Inpatient Hospital Stay: Payer: Medicare Other | Attending: Internal Medicine

## 2015-11-29 ENCOUNTER — Other Ambulatory Visit: Payer: Self-pay | Admitting: *Deleted

## 2015-11-29 ENCOUNTER — Inpatient Hospital Stay: Payer: Medicare Other

## 2015-11-29 VITALS — BP 132/62 | HR 58 | Temp 97.8°F | Resp 18 | Wt 122.4 lb

## 2015-11-29 DIAGNOSIS — M25551 Pain in right hip: Secondary | ICD-10-CM

## 2015-11-29 DIAGNOSIS — C7911 Secondary malignant neoplasm of bladder: Secondary | ICD-10-CM | POA: Insufficient documentation

## 2015-11-29 DIAGNOSIS — I82402 Acute embolism and thrombosis of unspecified deep veins of left lower extremity: Secondary | ICD-10-CM | POA: Insufficient documentation

## 2015-11-29 DIAGNOSIS — Z90722 Acquired absence of ovaries, bilateral: Secondary | ICD-10-CM | POA: Insufficient documentation

## 2015-11-29 DIAGNOSIS — M1990 Unspecified osteoarthritis, unspecified site: Secondary | ICD-10-CM

## 2015-11-29 DIAGNOSIS — G893 Neoplasm related pain (acute) (chronic): Secondary | ICD-10-CM

## 2015-11-29 DIAGNOSIS — Z9012 Acquired absence of left breast and nipple: Secondary | ICD-10-CM | POA: Insufficient documentation

## 2015-11-29 DIAGNOSIS — Z86711 Personal history of pulmonary embolism: Secondary | ICD-10-CM | POA: Diagnosis not present

## 2015-11-29 DIAGNOSIS — Z90710 Acquired absence of both cervix and uterus: Secondary | ICD-10-CM

## 2015-11-29 DIAGNOSIS — Z79899 Other long term (current) drug therapy: Secondary | ICD-10-CM | POA: Diagnosis not present

## 2015-11-29 DIAGNOSIS — Z9181 History of falling: Secondary | ICD-10-CM | POA: Diagnosis not present

## 2015-11-29 DIAGNOSIS — E889 Metabolic disorder, unspecified: Secondary | ICD-10-CM

## 2015-11-29 DIAGNOSIS — Z7901 Long term (current) use of anticoagulants: Secondary | ICD-10-CM | POA: Diagnosis not present

## 2015-11-29 DIAGNOSIS — C7951 Secondary malignant neoplasm of bone: Secondary | ICD-10-CM

## 2015-11-29 DIAGNOSIS — Z86010 Personal history of colonic polyps: Secondary | ICD-10-CM | POA: Insufficient documentation

## 2015-11-29 DIAGNOSIS — Z923 Personal history of irradiation: Secondary | ICD-10-CM | POA: Insufficient documentation

## 2015-11-29 DIAGNOSIS — Z86718 Personal history of other venous thrombosis and embolism: Secondary | ICD-10-CM | POA: Diagnosis not present

## 2015-11-29 DIAGNOSIS — N189 Chronic kidney disease, unspecified: Secondary | ICD-10-CM | POA: Diagnosis not present

## 2015-11-29 DIAGNOSIS — Z880 Allergy status to penicillin: Secondary | ICD-10-CM

## 2015-11-29 DIAGNOSIS — C541 Malignant neoplasm of endometrium: Secondary | ICD-10-CM

## 2015-11-29 DIAGNOSIS — Z170 Estrogen receptor positive status [ER+]: Secondary | ICD-10-CM

## 2015-11-29 DIAGNOSIS — M9080 Osteopathy in diseases classified elsewhere, unspecified site: Secondary | ICD-10-CM

## 2015-11-29 DIAGNOSIS — C50812 Malignant neoplasm of overlapping sites of left female breast: Secondary | ICD-10-CM | POA: Insufficient documentation

## 2015-11-29 DIAGNOSIS — M898X9 Other specified disorders of bone, unspecified site: Secondary | ICD-10-CM

## 2015-11-29 DIAGNOSIS — Z8673 Personal history of transient ischemic attack (TIA), and cerebral infarction without residual deficits: Secondary | ICD-10-CM | POA: Insufficient documentation

## 2015-11-29 DIAGNOSIS — K219 Gastro-esophageal reflux disease without esophagitis: Secondary | ICD-10-CM

## 2015-11-29 LAB — COMPREHENSIVE METABOLIC PANEL
ALBUMIN: 3.9 g/dL (ref 3.5–5.0)
ALK PHOS: 29 U/L — AB (ref 38–126)
ALT: 14 U/L (ref 14–54)
ANION GAP: 9 (ref 5–15)
AST: 19 U/L (ref 15–41)
BUN: 22 mg/dL — ABNORMAL HIGH (ref 6–20)
CALCIUM: 8.9 mg/dL (ref 8.9–10.3)
CO2: 22 mmol/L (ref 22–32)
Chloride: 109 mmol/L (ref 101–111)
Creatinine, Ser: 1.05 mg/dL — ABNORMAL HIGH (ref 0.44–1.00)
GFR calc Af Amer: 55 mL/min — ABNORMAL LOW (ref >60)
GFR calc non Af Amer: 47 mL/min — ABNORMAL LOW (ref >60)
GLUCOSE: 116 mg/dL — AB (ref 65–99)
Potassium: 3.9 mmol/L (ref 3.5–5.1)
SODIUM: 140 mmol/L (ref 135–145)
Total Bilirubin: 0.8 mg/dL (ref 0.3–1.2)
Total Protein: 6.7 g/dL (ref 6.5–8.1)

## 2015-11-29 LAB — CBC WITH DIFFERENTIAL/PLATELET
BASOS PCT: 1 %
Basophils Absolute: 0 10*3/uL (ref 0–0.1)
EOS ABS: 0.2 10*3/uL (ref 0–0.7)
Eosinophils Relative: 4 %
HCT: 40.9 % (ref 35.0–47.0)
HEMOGLOBIN: 14 g/dL (ref 12.0–16.0)
Lymphocytes Relative: 29 %
Lymphs Abs: 1.1 10*3/uL (ref 1.0–3.6)
MCH: 32.7 pg (ref 26.0–34.0)
MCHC: 34.2 g/dL (ref 32.0–36.0)
MCV: 95.7 fL (ref 80.0–100.0)
Monocytes Absolute: 0.5 10*3/uL (ref 0.2–0.9)
Monocytes Relative: 12 %
NEUTROS PCT: 54 %
Neutro Abs: 2 10*3/uL (ref 1.4–6.5)
Platelets: 215 10*3/uL (ref 150–440)
RBC: 4.28 MIL/uL (ref 3.80–5.20)
RDW: 13.5 % (ref 11.5–14.5)
WBC: 3.8 10*3/uL (ref 3.6–11.0)

## 2015-11-29 MED ORDER — OXYCODONE-ACETAMINOPHEN 5-325 MG PO TABS: 1.0000 | ORAL_TABLET | Freq: Four times a day (QID) | ORAL | 0 refills | Status: DC | PRN

## 2015-11-29 MED ORDER — OXYCODONE-ACETAMINOPHEN 5-325 MG PO TABS: 1.0000 | ORAL_TABLET | ORAL | 0 refills | Status: DC | PRN

## 2015-11-29 MED ORDER — DENOSUMAB 120 MG/1.7ML ~~LOC~~ SOLN
120.0000 mg | Freq: Once | SUBCUTANEOUS | Status: AC
  Administered 2015-11-29: 120 mg via SUBCUTANEOUS
  Filled 2015-11-29: qty 1.7

## 2015-11-29 MED ORDER — FENTANYL 50 MCG/HR TD PT72: 50.0000 ug | MEDICATED_PATCH | TRANSDERMAL | 0 refills | Status: DC

## 2015-11-29 NOTE — Assessment & Plan Note (Addendum)
Metastatic endometrial cancer to the bone- currently on Megace. Tolerating it well. Continue Megace. PET- AUG 22nd- Improved. Will get PET in dec; will order at next visit.   # Swelling of the left lower extremity; Aug 2017- Dopplers of the lower extremity DVT extensive; currently improved. [see discussion below].Eliquis  5 mg BID.  # Hematuria--resolved. ? Stent removed.   # Metastatic lesions to the bone on X-geva- every 4 weeks.   # right hip pain [met to bone]- on Fentanyl/ percocet continue.   # Recent fall- recommend walking with cane.   # Follow-up in 4 weeks; CBC CMP/X-geva in 4 weeks. Order PET at next visit.

## 2015-11-29 NOTE — Progress Notes (Signed)
[name]  Traci [name] Care Team: Idelle Crouch, MD as PCP - General (Unknown Physician Specialty) [name], MD as Referring Physician (Surgery) [name], MD as Consulting Physician (Urology)  Cancer of left breast [facility])   Staging form: Breast, AJCC 7th Edition     Clinical: Stage IA (T1b, N0, M0) - Signed by Forest Gleason, MD on 10/20/2014    Oncology History   . Traci [name] has a previous history of endometrium carcinoma of endometrium stage IB disease status post bilateral salpingo-oophorectomy and radiation therapy 4. Right hydronephrosis detected on MRI scan off lumbosacral spine. Cystoscopy with attempted stent placement revealed bladder tumor in September of 2016 biopsies positive for recurrent endometrial cancer  5. Traci [name] had stent placement in the right kidney Started radiation therapy 6. Traci [name] has finished radiation therapy with relief in the pain (November, 2016) 7. Started on Megace in December of 2016 8 deep vein thrombosis in the left lower extremity (February 08, 2014) Hematuria due to xeralto IVC filter placement on February 11, 2015 She is off xeralto 9.condition recently had a cystoscopy no evidence of bleeding was found so Traci [name] has been started on ELOQUIS (March 15, 2015)  10. Traci [name] again had a significant bleeding with hematuria with eloquis and has been discontinued; Traci [name] is on Plavix. ON  Megace. AUG 2017- PET IMPROVED; mediastinal uptake- monitor for now.   # August 25 2015- LLE DVT- start Eliquis [stop plavix];   FEB 2017-LEFT BREAST CA [9'0 clock- overlapping site] Estrogen receptor positive. Progesterone receptor positive. HER-2 receptor; Her 2neg; 5 mm tumor clinically stage IB N0 M0 tumor;s/p Lumpectomy; NO RT  # X-geva q 4 w  # MOLECULAR STUDIES: 08/23/15- MMR-ordered.      Cancer of left breast ([place])   10/20/2014 Initial Diagnosis    Cancer of left breast        Endometrial cancer ([place])   11/18/2014 Initial Diagnosis    Endometrial cancer ([place])        INTERVAL HISTORY:  Traci Traci [name] 82 y.o.  female pleasant Traci [name] above history of Metastatic endometrial cancer currently on Megace- Is here for follow-up.  In the interim Traci [name] had a stent taken out; symptoms of hematuria has resolved.   Traci [name] has mild pain in the right hip; however not any worse. She is on pain patch. No recent falls. She is requesting a refill on her pain medications  Traci [name] denies any abdominal pain. Denies any new onset of bone pain. Appetite is good. Denies any jaw pain. No chest pain shortness of the cough.   REVIEW OF SYSTEMS:  A complete 10 point review of system is done which is negative except mentioned above/history of present illness.   PAST MEDICAL HISTORY :  Past Medical History:  Diagnosis Date  . Arthritis    Degenerative Disc Disease  . Bone cancer ([place])   . Breast cancer ([place]) 2016   LT LUMPECTOMY 03-2015  . Chronic kidney disease    HYDRONEPHROSIS  . Deep vein blood clot of left lower extremity ([place]) February 11, 2015   Left Leg  . Endometrial adenocarcinoma ([place]) 01/2012    stage Ib, grade 1, positive peritoneal cytology, no other risk factors or extra-uterine disease  . Gastric ulcer   . GERD (gastroesophageal reflux disease)   . Hearing loss   . Hiccups   . History of brachytherapy   . History of colon polyps   . History of DVT (deep vein thrombosis)   .  History of esophageal stricture   . History of shingles   . Inguinal lymphocyst   . Mild aphasia   . Pain    SEVERE CHRONIC RIGHT LEG,HIP,FOOT  . PE (pulmonary embolism)    H/O  . Stroke ([place]) 04/06/2011   residual:  aphasia    PAST SURGICAL HISTORY :   Past Surgical History:  Procedure Laterality Date  . ABDOMINAL HYSTERECTOMY  December 2013   history of cervical cancer  . BACK SURGERY     fusion of L3&4  . BREAST BIOPSY Left 10/13/2014   POS  . CATARACT EXTRACTION W/  INTRAOCULAR LENS IMPLANT     right  . CYSTOSCOPY W/ RETROGRADES Right 10/28/2014   Procedure: CYSTOSCOPY WITH RETROGRADE PYELOGRAM ATTEMPT, UNABLE TO PASS ;  Surgeon: [name], MD;  Location: [facility];  Service: Urology;  Laterality: Right;  . CYSTOSCOPY W/ RETROGRADES Right 09/15/2015   Procedure: CYSTOSCOPY WITH RETROGRADE PYELOGRAM;  Surgeon: [name], MD;  Location: [facility];  Service: Urology;  Laterality: Right;  . CYSTOSCOPY W/ URETERAL STENT PLACEMENT Right 03/10/2015   Procedure: CYSTOSCOPY WITH STENT REPLACEMENT;  Surgeon: [name], MD;  Location: [facility];  Service: Urology;  Laterality: Right;  . CYSTOSCOPY W/ URETERAL STENT PLACEMENT Right 06/09/2015   Procedure: CYSTOSCOPY WITH STENT REPLACEMENT;  Surgeon: [name], MD;  Location: [facility];  Service: Urology;  Laterality: Right;  . CYSTOSCOPY W/ URETERAL STENT PLACEMENT Right 09/15/2015   Procedure: CYSTOSCOPY WITH STENT REPLACEMENT;  Surgeon: [name], MD;  Location: [facility];  Service: Urology;  Laterality: Right;  . CYSTOSCOPY WITH STENT PLACEMENT Right 10/28/2014   Procedure: BLADDER BIOPSY WITH FULGERATION ;  Surgeon: [name], MD;  Location: [facility];  Service: Urology;  Laterality: Right;  . ESOPHAGEAL DILATION    . EYE SURGERY Bilateral    Cataract Extraction  . PARTIAL MASTECTOMY WITH NEEDLE LOCALIZATION Left 03/30/2015   Procedure: PARTIAL MASTECTOMY WITH NEEDLE LOCALIZATION;  Surgeon: [name], MD;  Location: [facility];  Service: General;  Laterality: Left;  . PERIPHERAL VASCULAR CATHETERIZATION N/A 02/11/2015   Procedure: IVC Filter Insertion;  Surgeon: Algernon Huxley, MD;  Location: [place];  Service: Cardiovascular;  Laterality: N/A;  . PORTACATH PLACEMENT Right 11/26/2014   Procedure: INSERTION PORT-A-CATH;  Surgeon: [name], MD;  Location: [facility];  Service: General;  Laterality: Right;  . SENTINEL NODE BIOPSY Left 03/30/2015    Procedure: SENTINEL NODE BIOPSY;  Surgeon: [name], MD;  Location: [facility];  Service: General;  Laterality: Left;  . thyroid removed    . THYROIDECTOMY, PARTIAL    . TONSILLECTOMY    . TOTAL ABDOMINAL HYSTERECTOMY W/ BILATERAL SALPINGOOPHORECTOMY Bilateral    staging biopsies    FAMILY HISTORY :   Family History  Problem Relation Age of Onset  . Stroke Mother   . Breast cancer Maternal Aunt     x 2    SOCIAL HISTORY:   Social History  Substance Use Topics  . Smoking status: Never Smoker  . Smokeless tobacco: Never Used  . Alcohol use No    ALLERGIES:  is allergic to penicillins; acetaminophen; hydrocodone-acetaminophen; novocain [procaine]; and sulfa antibiotics.  MEDICATIONS:  Current Outpatient Prescriptions  Medication Sig Dispense Refill  . apixaban (ELIQUIS) 5 MG TABS tablet Take 1 tablet (5 mg total) by mouth 2 (two) times daily. 60 tablet 5  . fentaNYL (DURAGESIC - DOSED MCG/HR) 50 MCG/HR Place 1 patch (50  mcg total) onto the skin every 3 (three) days. 10 patch 0  . gabapentin (NEURONTIN) 300 MG capsule Take 1 capsule (300 mg total) by mouth 3 (three) times daily. 90 capsule 2  . megestrol (MEGACE) 40 MG tablet Take 1 tablet (40 mg total) by mouth 4 (four) times daily. 120 tablet 3  . Multiple Vitamins-Minerals (MULTIVITAMIN WITH MINERALS) tablet Take 1 tablet by mouth daily. Reported on 06/30/2015    . ofloxacin (FLOXIN) 0.3 % otic solution     . oxyCODONE-acetaminophen (PERCOCET/ROXICET) 5-325 MG tablet Take 1 tablet by mouth every 6 (six) hours as needed for severe pain. 90 tablet 0   No current facility-administered medications for this visit.     PHYSICAL EXAMINATION: ECOG PERFORMANCE STATUS: 1 - Symptomatic but completely ambulatory  BP 132/62 (BP Location: Left Arm, Traci [name] Position: Sitting)   Pulse (!) 58   Temp 97.8 F (36.6 C) (Tympanic)   Resp 18   Wt 122 lb 6.4 oz (55.5 kg)   BMI 22.39 kg/m   Filed Weights   11/29/15 1105  Weight:  122 lb 6.4 oz (55.5 kg)    GENERAL: Traci Traci [name] walking herself Alert, no distress and comfortable. She is alone. EYES: no pallor or icterus OROPHARYNX: no thrush or ulceration; good dentition  NECK: supple, no masses felt LYMPH:  no palpable lymphadenopathy in the cervical, axillary or inguinal regions LUNGS: clear to auscultation and  No wheeze or crackles HEART/CVS: regular rate & rhythm and no murmurs; Left lower extremity swollen compared to the right.  ABDOMEN:abdomen soft, non-tender and normal bowel sounds Musculoskeletal:no cyanosis of digits and no clubbing  PSYCH: alert & oriented x 3 with fluent speech NEURO: no focal motor/sensory deficits SKIN:  no rashes or significant lesions  LABORATORY DATA:  I have reviewed the data as listed    Component Value Date/Time   NA 140 11/29/2015 1005   NA 139 11/04/2013 1257   K 3.9 11/29/2015 1005   K 4.3 03/05/2014 1156   CL 109 11/29/2015 1005   CL 106 11/04/2013 1257   CO2 22 11/29/2015 1005   CO2 26 11/04/2013 1257   GLUCOSE 116 (H) 11/29/2015 1005   GLUCOSE 96 11/04/2013 1257   BUN 22 (H) 11/29/2015 1005   BUN 13 11/04/2013 1257   CREATININE 1.05 (H) 11/29/2015 1005   CREATININE 0.90 11/04/2013 1257   CALCIUM 8.9 11/29/2015 1005   CALCIUM 8.5 11/04/2013 1257   PROT 6.7 11/29/2015 1005   PROT 6.8 11/04/2013 1257   ALBUMIN 3.9 11/29/2015 1005   ALBUMIN 3.5 11/04/2013 1257   AST 19 11/29/2015 1005   AST 25 11/04/2013 1257   ALT 14 11/29/2015 1005   ALT 20 11/04/2013 1257   ALKPHOS 29 (L) 11/29/2015 1005   ALKPHOS 64 11/04/2013 1257   BILITOT 0.8 11/29/2015 1005   BILITOT 0.5 11/04/2013 1257   GFRNONAA 47 (L) 11/29/2015 1005   GFRNONAA >60 11/04/2013 1257   GFRNONAA >60 03/29/2012 1650   GFRAA 55 (L) 11/29/2015 1005   GFRAA >60 11/04/2013 1257   GFRAA >60 03/29/2012 1650    No results found for: SPEP, UPEP  Lab Results  Component Value Date   WBC 3.8 11/29/2015   NEUTROABS 2.0  11/29/2015   HGB 14.0 11/29/2015   HCT 40.9 11/29/2015   MCV 95.7 11/29/2015   PLT 215 11/29/2015      Chemistry      Component Value Date/Time   NA 140 11/29/2015 1005   NA  139 11/04/2013 1257   K 3.9 11/29/2015 1005   K 4.3 03/05/2014 1156   CL 109 11/29/2015 1005   CL 106 11/04/2013 1257   CO2 22 11/29/2015 1005   CO2 26 11/04/2013 1257   BUN 22 (H) 11/29/2015 1005   BUN 13 11/04/2013 1257   CREATININE 1.05 (H) 11/29/2015 1005   CREATININE 0.90 11/04/2013 1257      Component Value Date/Time   CALCIUM 8.9 11/29/2015 1005   CALCIUM 8.5 11/04/2013 1257   ALKPHOS 29 (L) 11/29/2015 1005   ALKPHOS 64 11/04/2013 1257   AST 19 11/29/2015 1005   AST 25 11/04/2013 1257   ALT 14 11/29/2015 1005   ALT 20 11/04/2013 1257   BILITOT 0.8 11/29/2015 1005   BILITOT 0.5 11/04/2013 1257       RADIOGRAPHIC STUDIES: I have personally reviewed the radiological images as listed and agreed with the findings in the report. No results found.   ASSESSMENT & PLAN:  Endometrial cancer ([place]) Metastatic endometrial cancer to the bone- currently on Megace. Tolerating it well. Continue Megace. PET- AUG 22nd- Improved. Will get PET in dec; will order at next visit.   # Swelling of the left lower extremity; Aug 2017- Dopplers of the lower extremity DVT extensive; currently improved. [see discussion below].Eliquis  5 mg BID.  # Hematuria--resolved. ? Stent removed.   # Metastatic lesions to the bone on X-geva- every 4 weeks.   # right hip pain [met to bone]- on Fentanyl/ percocet continue.   # Recent fall- recommend walking with cane.   # Follow-up in 4 weeks; CBC CMP/X-geva in 4 weeks. Order PET at next visit.  No orders of the defined types were placed in this encounter.     Cammie Sickle, MD 12/02/2015 5:52 PM

## 2015-11-29 NOTE — Progress Notes (Signed)
Patient is here for follow up, she mentions that she had a fall last Tuesday. She did not get checked out after the fall, she did not hit her head when she fell.

## 2015-11-30 ENCOUNTER — Other Ambulatory Visit: Payer: Self-pay

## 2015-11-30 DIAGNOSIS — C541 Malignant neoplasm of endometrium: Secondary | ICD-10-CM

## 2015-11-30 LAB — CA 125: CA 125: 24.9 U/mL (ref 0.0–38.1)

## 2015-12-05 ENCOUNTER — Other Ambulatory Visit: Payer: Self-pay | Admitting: Internal Medicine

## 2015-12-05 DIAGNOSIS — C50912 Malignant neoplasm of unspecified site of left female breast: Secondary | ICD-10-CM

## 2015-12-05 DIAGNOSIS — C541 Malignant neoplasm of endometrium: Secondary | ICD-10-CM

## 2015-12-08 ENCOUNTER — Other Ambulatory Visit: Payer: Self-pay | Admitting: Internal Medicine

## 2015-12-08 DIAGNOSIS — Z87448 Personal history of other diseases of urinary system: Secondary | ICD-10-CM

## 2015-12-13 ENCOUNTER — Other Ambulatory Visit: Payer: Self-pay | Admitting: *Deleted

## 2015-12-13 DIAGNOSIS — M79662 Pain in left lower leg: Secondary | ICD-10-CM

## 2015-12-13 DIAGNOSIS — C541 Malignant neoplasm of endometrium: Secondary | ICD-10-CM

## 2015-12-13 DIAGNOSIS — M7989 Other specified soft tissue disorders: Secondary | ICD-10-CM

## 2015-12-13 MED ORDER — APIXABAN 5 MG PO TABS: 5.0000 mg | ORAL_TABLET | Freq: Two times a day (BID) | ORAL | 5 refills | Status: DC

## 2015-12-13 NOTE — Addendum Note (Signed)
Addended by: Jarrett Soho C on: 12/13/2015 10:08 AM   Modules accepted: Orders

## 2015-12-24 ENCOUNTER — Ambulatory Visit
Admission: RE | Admit: 2015-12-24 | Discharge: 2015-12-24 | Disposition: A | Discharge status: Home / Self Care | Payer: Medicare Other | Source: Ambulatory Visit | Attending: Internal Medicine | Admitting: Internal Medicine

## 2015-12-24 ENCOUNTER — Other Ambulatory Visit: Payer: Medicare Other

## 2015-12-24 DIAGNOSIS — Z87448 Personal history of other diseases of urinary system: Secondary | ICD-10-CM | POA: Insufficient documentation

## 2015-12-24 DIAGNOSIS — N261 Atrophy of kidney (terminal): Secondary | ICD-10-CM | POA: Diagnosis not present

## 2015-12-24 DIAGNOSIS — N281 Cyst of kidney, acquired: Secondary | ICD-10-CM | POA: Diagnosis not present

## 2015-12-27 ENCOUNTER — Inpatient Hospital Stay: Payer: Medicare Other

## 2015-12-27 ENCOUNTER — Inpatient Hospital Stay: Payer: Medicare Other | Attending: Internal Medicine | Admitting: Internal Medicine

## 2015-12-27 VITALS — BP 136/73 | HR 65 | Temp 97.2°F | Resp 22 | Ht 62.0 in | Wt 126.0 lb

## 2015-12-27 DIAGNOSIS — Z90722 Acquired absence of ovaries, bilateral: Secondary | ICD-10-CM | POA: Insufficient documentation

## 2015-12-27 DIAGNOSIS — Z86010 Personal history of colonic polyps: Secondary | ICD-10-CM | POA: Diagnosis not present

## 2015-12-27 DIAGNOSIS — M25551 Pain in right hip: Secondary | ICD-10-CM | POA: Insufficient documentation

## 2015-12-27 DIAGNOSIS — Z86711 Personal history of pulmonary embolism: Secondary | ICD-10-CM | POA: Diagnosis not present

## 2015-12-27 DIAGNOSIS — E889 Metabolic disorder, unspecified: Secondary | ICD-10-CM | POA: Insufficient documentation

## 2015-12-27 DIAGNOSIS — M1990 Unspecified osteoarthritis, unspecified site: Secondary | ICD-10-CM

## 2015-12-27 DIAGNOSIS — Z8541 Personal history of malignant neoplasm of cervix uteri: Secondary | ICD-10-CM

## 2015-12-27 DIAGNOSIS — G893 Neoplasm related pain (acute) (chronic): Secondary | ICD-10-CM | POA: Insufficient documentation

## 2015-12-27 DIAGNOSIS — Z803 Family history of malignant neoplasm of breast: Secondary | ICD-10-CM | POA: Insufficient documentation

## 2015-12-27 DIAGNOSIS — Z86718 Personal history of other venous thrombosis and embolism: Secondary | ICD-10-CM | POA: Diagnosis not present

## 2015-12-27 DIAGNOSIS — Z7901 Long term (current) use of anticoagulants: Secondary | ICD-10-CM | POA: Insufficient documentation

## 2015-12-27 DIAGNOSIS — C50812 Malignant neoplasm of overlapping sites of left female breast: Secondary | ICD-10-CM | POA: Diagnosis not present

## 2015-12-27 DIAGNOSIS — K219 Gastro-esophageal reflux disease without esophagitis: Secondary | ICD-10-CM | POA: Diagnosis not present

## 2015-12-27 DIAGNOSIS — Z9221 Personal history of antineoplastic chemotherapy: Secondary | ICD-10-CM | POA: Diagnosis not present

## 2015-12-27 DIAGNOSIS — Z170 Estrogen receptor positive status [ER+]: Secondary | ICD-10-CM | POA: Insufficient documentation

## 2015-12-27 DIAGNOSIS — Z8673 Personal history of transient ischemic attack (TIA), and cerebral infarction without residual deficits: Secondary | ICD-10-CM | POA: Diagnosis not present

## 2015-12-27 DIAGNOSIS — C7911 Secondary malignant neoplasm of bladder: Secondary | ICD-10-CM | POA: Diagnosis not present

## 2015-12-27 DIAGNOSIS — C541 Malignant neoplasm of endometrium: Secondary | ICD-10-CM

## 2015-12-27 DIAGNOSIS — Z880 Allergy status to penicillin: Secondary | ICD-10-CM | POA: Insufficient documentation

## 2015-12-27 DIAGNOSIS — M9080 Osteopathy in diseases classified elsewhere, unspecified site: Secondary | ICD-10-CM

## 2015-12-27 DIAGNOSIS — N189 Chronic kidney disease, unspecified: Secondary | ICD-10-CM | POA: Insufficient documentation

## 2015-12-27 DIAGNOSIS — Z79899 Other long term (current) drug therapy: Secondary | ICD-10-CM | POA: Insufficient documentation

## 2015-12-27 DIAGNOSIS — Z90710 Acquired absence of both cervix and uterus: Secondary | ICD-10-CM | POA: Insufficient documentation

## 2015-12-27 DIAGNOSIS — M898X9 Other specified disorders of bone, unspecified site: Secondary | ICD-10-CM

## 2015-12-27 DIAGNOSIS — C7951 Secondary malignant neoplasm of bone: Secondary | ICD-10-CM | POA: Diagnosis not present

## 2015-12-27 DIAGNOSIS — Z923 Personal history of irradiation: Secondary | ICD-10-CM

## 2015-12-27 LAB — CBC WITH DIFFERENTIAL/PLATELET
BASOS ABS: 0.1 10*3/uL (ref 0–0.1)
Basophils Relative: 1 %
EOS ABS: 0.2 10*3/uL (ref 0–0.7)
EOS PCT: 3 %
HCT: 41.4 % (ref 35.0–47.0)
Hemoglobin: 14.3 g/dL (ref 12.0–16.0)
LYMPHS PCT: 17 %
Lymphs Abs: 1.2 10*3/uL (ref 1.0–3.6)
MCH: 32.9 pg (ref 26.0–34.0)
MCHC: 34.4 g/dL (ref 32.0–36.0)
MCV: 95.6 fL (ref 80.0–100.0)
MONO ABS: 0.6 10*3/uL (ref 0.2–0.9)
Monocytes Relative: 9 %
Neutro Abs: 4.6 10*3/uL (ref 1.4–6.5)
Neutrophils Relative %: 70 %
PLATELETS: 224 10*3/uL (ref 150–440)
RBC: 4.33 MIL/uL (ref 3.80–5.20)
RDW: 13.2 % (ref 11.5–14.5)
WBC: 6.6 10*3/uL (ref 3.6–11.0)

## 2015-12-27 LAB — COMPREHENSIVE METABOLIC PANEL
ALT: 18 U/L (ref 14–54)
AST: 22 U/L (ref 15–41)
Albumin: 3.9 g/dL (ref 3.5–5.0)
Alkaline Phosphatase: 30 U/L — ABNORMAL LOW (ref 38–126)
Anion gap: 9 (ref 5–15)
BUN: 27 mg/dL — AB (ref 6–20)
CHLORIDE: 107 mmol/L (ref 101–111)
CO2: 25 mmol/L (ref 22–32)
Calcium: 8.9 mg/dL (ref 8.9–10.3)
Creatinine, Ser: 1.1 mg/dL — ABNORMAL HIGH (ref 0.44–1.00)
GFR, EST AFRICAN AMERICAN: 52 mL/min — AB (ref >60)
GFR, EST NON AFRICAN AMERICAN: 45 mL/min — AB (ref >60)
Glucose, Bld: 105 mg/dL — ABNORMAL HIGH (ref 65–99)
POTASSIUM: 3.9 mmol/L (ref 3.5–5.1)
Sodium: 141 mmol/L (ref 135–145)
Total Bilirubin: 0.7 mg/dL (ref 0.3–1.2)
Total Protein: 6.5 g/dL (ref 6.5–8.1)

## 2015-12-27 MED ORDER — FENTANYL 50 MCG/HR TD PT72: 50.0000 ug | MEDICATED_PATCH | TRANSDERMAL | 0 refills | Status: DC

## 2015-12-27 MED ORDER — DENOSUMAB 120 MG/1.7ML ~~LOC~~ SOLN
120.0000 mg | Freq: Once | SUBCUTANEOUS | Status: AC
  Administered 2015-12-27: 120 mg via SUBCUTANEOUS

## 2015-12-27 MED ORDER — OXYCODONE-ACETAMINOPHEN 5-325 MG PO TABS: 1.0000 | ORAL_TABLET | Freq: Four times a day (QID) | ORAL | 0 refills | Status: DC | PRN

## 2015-12-27 NOTE — Progress Notes (Signed)
Pt has had her right renal stent removed.  She reports she had a recent renal u/s performed which was ordered by per pcp. Pt reports urinary incontinence - recently rx for veiscare.  Requesting RF on percocet and fentanyl patches.

## 2015-12-27 NOTE — Progress Notes (Signed)
[place] OFFICE PROGRESS NOTE  Patient Care Team: Idelle Crouch, MD as PCP - General (Unknown Physician Specialty) [name], MD as Referring Physician (Surgery) [name], MD as Consulting Physician (Urology)  Cancer of left breast [facility])   Staging form: Breast, AJCC 7th Edition     Clinical: Stage IA (T1b, N0, M0) - Signed by Forest Gleason, MD on 10/20/2014    Oncology History   . Patient has a previous history of endometrium carcinoma of endometrium stage IB disease status post bilateral salpingo-oophorectomy and radiation therapy 4. Right hydronephrosis detected on MRI scan off lumbosacral spine. Cystoscopy with attempted stent placement revealed bladder tumor in September of 2016 biopsies positive for recurrent endometrial cancer  5. Patient had stent placement in the right kidney Started radiation therapy 6. Patient has finished radiation therapy with relief in the pain (November, 2016) 7. Started on Megace in December of 2016 8 deep vein thrombosis in the left lower extremity (February 08, 2014) Hematuria due to xeralto IVC filter placement on February 11, 2015 She is off xeralto 9.condition recently had a cystoscopy no evidence of bleeding was found so patient has been started on ELOQUIS (March 15, 2015)  10. Patient again had a significant bleeding with hematuria with eloquis and has been discontinued; Patient is on Plavix. ON  Megace. AUG 2017- PET IMPROVED; mediastinal uptake- monitor for now.   # August 25 2015- LLE DVT- start Eliquis [stop plavix];   FEB 2017-LEFT BREAST CA [9'0 clock- overlapping site] Estrogen receptor positive. Progesterone receptor positive. HER-2 receptor; Her 2neg; 5 mm tumor clinically stage IB N0 M0 tumor;s/p Lumpectomy; NO RT  # X-geva q 4 w  # MOLECULAR STUDIES: 08/23/15- MMR-STABLE.      Cancer of left breast ([place])   10/20/2014 Initial Diagnosis    Cancer of left breast        Endometrial cancer ([place])   11/18/2014 Initial Diagnosis    Endometrial cancer ([place])        INTERVAL HISTORY:  Traci [name] 82 y.o.  female pleasant patient above history of Metastatic endometrial cancer currently on Megace- Is here for follow-up.  Patient complains of pain worsening in the right hip. Especially with walking. Patient's fentanyl patch and also taking Percocet twice a day. Denies any blood in urine. She continues to be on anticoagulation.  Patient denies any abdominal pain. Denies any new onset of bone pain. Appetite is good. Denies any jaw pain. No chest pain shortness of the cough.   REVIEW OF SYSTEMS:  A complete 10 point review of system is done which is negative except mentioned above/history of present illness.   PAST MEDICAL HISTORY :  Past Medical History:  Diagnosis Date  . Arthritis    Degenerative Disc Disease  . Bone cancer ([place])   . Breast cancer ([place]) 2016   LT LUMPECTOMY 03-2015  . Chronic kidney disease    HYDRONEPHROSIS  . Deep vein blood clot of left lower extremity ([place]) February 11, 2015   Left Leg  . Endometrial adenocarcinoma ([place]) 01/2012    stage Ib, grade 1, positive peritoneal cytology, no other risk factors or extra-uterine disease  . Gastric ulcer   . GERD (gastroesophageal reflux disease)   . Hearing loss   . Hiccups   . History of brachytherapy   . History of colon polyps   . History of DVT (deep vein thrombosis)   . History of esophageal stricture   . History of shingles   .  Inguinal lymphocyst   . Mild aphasia   . Pain    SEVERE CHRONIC RIGHT LEG,HIP,FOOT  . PE (pulmonary embolism)    H/O  . Stroke ([place]) 04/06/2011   residual:  aphasia    PAST SURGICAL HISTORY :   Past Surgical History:  Procedure Laterality Date  . ABDOMINAL HYSTERECTOMY  December 2013   history of cervical cancer  . BACK SURGERY     fusion of L3&4  . BREAST BIOPSY Left 10/13/2014   POS  . CATARACT EXTRACTION W/ INTRAOCULAR LENS IMPLANT      right  . CYSTOSCOPY W/ RETROGRADES Right 10/28/2014   Procedure: CYSTOSCOPY WITH RETROGRADE PYELOGRAM ATTEMPT, UNABLE TO PASS ;  Surgeon: [name], MD;  Location: [facility];  Service: Urology;  Laterality: Right;  . CYSTOSCOPY W/ RETROGRADES Right 09/15/2015   Procedure: CYSTOSCOPY WITH RETROGRADE PYELOGRAM;  Surgeon: [name], MD;  Location: [facility];  Service: Urology;  Laterality: Right;  . CYSTOSCOPY W/ URETERAL STENT PLACEMENT Right 03/10/2015   Procedure: CYSTOSCOPY WITH STENT REPLACEMENT;  Surgeon: [name], MD;  Location: [facility];  Service: Urology;  Laterality: Right;  . CYSTOSCOPY W/ URETERAL STENT PLACEMENT Right 06/09/2015   Procedure: CYSTOSCOPY WITH STENT REPLACEMENT;  Surgeon: [name], MD;  Location: [facility];  Service: Urology;  Laterality: Right;  . CYSTOSCOPY W/ URETERAL STENT PLACEMENT Right 09/15/2015   Procedure: CYSTOSCOPY WITH STENT REPLACEMENT;  Surgeon: [name], MD;  Location: [facility];  Service: Urology;  Laterality: Right;  . CYSTOSCOPY WITH STENT PLACEMENT Right 10/28/2014   Procedure: BLADDER BIOPSY WITH FULGERATION ;  Surgeon: [name], MD;  Location: [facility];  Service: Urology;  Laterality: Right;  . ESOPHAGEAL DILATION    . EYE SURGERY Bilateral    Cataract Extraction  . PARTIAL MASTECTOMY WITH NEEDLE LOCALIZATION Left 03/30/2015   Procedure: PARTIAL MASTECTOMY WITH NEEDLE LOCALIZATION;  Surgeon: [name], MD;  Location: [facility];  Service: General;  Laterality: Left;  . PERIPHERAL VASCULAR CATHETERIZATION N/A 02/11/2015   Procedure: IVC Filter Insertion;  Surgeon: Algernon Huxley, MD;  Location: [facility];  Service: Cardiovascular;  Laterality: N/A;  . PORTACATH PLACEMENT Right 11/26/2014   Procedure: INSERTION PORT-A-CATH;  Surgeon: [name], MD;  Location: [facility];  Service: General;  Laterality: Right;  . SENTINEL NODE BIOPSY Left 03/30/2015   Procedure: SENTINEL NODE BIOPSY;   Surgeon: [name], MD;  Location: [facility];  Service: General;  Laterality: Left;  . thyroid removed    . THYROIDECTOMY, PARTIAL    . TONSILLECTOMY    . TOTAL ABDOMINAL HYSTERECTOMY W/ BILATERAL SALPINGOOPHORECTOMY Bilateral    staging biopsies    FAMILY HISTORY :   Family History  Problem Relation Age of Onset  . Stroke Mother   . Breast cancer Maternal Aunt     x 2    SOCIAL HISTORY:   Social History  Substance Use Topics  . Smoking status: Never Smoker  . Smokeless tobacco: Never Used  . Alcohol use No    ALLERGIES:  is allergic to penicillins; acetaminophen; hydrocodone-acetaminophen; novocain [procaine]; and sulfa antibiotics.  MEDICATIONS:  Current Outpatient Prescriptions  Medication Sig Dispense Refill  . apixaban (ELIQUIS) 5 MG TABS tablet Take 1 tablet (5 mg total) by mouth 2 (two) times daily. 60 tablet 5  . fentaNYL (DURAGESIC - DOSED MCG/HR) 50 MCG/HR Place 1 patch (50 mcg total) onto the skin every 3 (three) days. 10 patch 0  .  gabapentin (NEURONTIN) 300 MG capsule TAKE ONE CAPSULE BY MOUTH THREE TIMES DAILY 90 capsule 2  . megestrol (MEGACE) 40 MG tablet Take 1 tablet (40 mg total) by mouth 4 (four) times daily. 120 tablet 3  . Multiple Vitamins-Minerals (MULTIVITAMIN WITH MINERALS) tablet Take 1 tablet by mouth daily. Reported on 06/30/2015    . oxyCODONE-acetaminophen (PERCOCET/ROXICET) 5-325 MG tablet Take 1 tablet by mouth every 6 (six) hours as needed for severe pain. 90 tablet 0  . VESICARE 10 MG tablet Take 1 tablet by mouth daily.     No current facility-administered medications for this visit.     PHYSICAL EXAMINATION: ECOG PERFORMANCE STATUS: 1 - Symptomatic but completely ambulatory  BP 136/73 (Patient Position: Sitting)   Pulse 65   Temp 97.2 F (36.2 C) (Tympanic)   Resp (!) 22   Ht '5\' 2"'$  (1.575 m)   Wt 126 lb (57.2 kg)   BMI 23.05 kg/m   Filed Weights   12/27/15 1522  Weight: 126 lb (57.2 kg)    GENERAL: [place]  Caucasian female patient walking herself Alert, no distress and comfortable. She Is accompanied by her husband. EYES: no pallor or icterus OROPHARYNX: no thrush or ulceration; good dentition  NECK: supple, no masses felt LYMPH:  no palpable lymphadenopathy in the cervical, axillary or inguinal regions LUNGS: clear to auscultation and  No wheeze or crackles HEART/CVS: regular rate & rhythm and no murmurs; Left lower extremity swollen compared to the right.  ABDOMEN:abdomen soft, non-tender and normal bowel sounds Musculoskeletal:no cyanosis of digits and no clubbing  PSYCH: alert & oriented x 3 with fluent speech NEURO: no focal motor/sensory deficits SKIN:  no rashes or significant lesions  LABORATORY DATA:  I have reviewed the data as listed    Component Value Date/Time   NA 141 12/27/2015 1450   NA 139 11/04/2013 1257   K 3.9 12/27/2015 1450   K 4.3 03/05/2014 1156   CL 107 12/27/2015 1450   CL 106 11/04/2013 1257   CO2 25 12/27/2015 1450   CO2 26 11/04/2013 1257   GLUCOSE 105 (H) 12/27/2015 1450   GLUCOSE 96 11/04/2013 1257   BUN 27 (H) 12/27/2015 1450   BUN 13 11/04/2013 1257   CREATININE 1.10 (H) 12/27/2015 1450   CREATININE 0.90 11/04/2013 1257   CALCIUM 8.9 12/27/2015 1450   CALCIUM 8.5 11/04/2013 1257   PROT 6.5 12/27/2015 1450   PROT 6.8 11/04/2013 1257   ALBUMIN 3.9 12/27/2015 1450   ALBUMIN 3.5 11/04/2013 1257   AST 22 12/27/2015 1450   AST 25 11/04/2013 1257   ALT 18 12/27/2015 1450   ALT 20 11/04/2013 1257   ALKPHOS 30 (L) 12/27/2015 1450   ALKPHOS 64 11/04/2013 1257   BILITOT 0.7 12/27/2015 1450   BILITOT 0.5 11/04/2013 1257   GFRNONAA 45 (L) 12/27/2015 1450   GFRNONAA >60 11/04/2013 1257   GFRNONAA >60 03/29/2012 1650   GFRAA 52 (L) 12/27/2015 1450   GFRAA >60 11/04/2013 1257   GFRAA >60 03/29/2012 1650    No results found for: SPEP, UPEP  Lab Results  Component Value Date   WBC 6.6 12/27/2015   NEUTROABS 4.6 12/27/2015   HGB 14.3 12/27/2015    HCT 41.4 12/27/2015   MCV 95.6 12/27/2015   PLT 224 12/27/2015      Chemistry      Component Value Date/Time   NA 141 12/27/2015 1450   NA 139 11/04/2013 1257   K 3.9 12/27/2015 1450   K 4.3  03/05/2014 1156   CL 107 12/27/2015 1450   CL 106 11/04/2013 1257   CO2 25 12/27/2015 1450   CO2 26 11/04/2013 1257   BUN 27 (H) 12/27/2015 1450   BUN 13 11/04/2013 1257   CREATININE 1.10 (H) 12/27/2015 1450   CREATININE 0.90 11/04/2013 1257      Component Value Date/Time   CALCIUM 8.9 12/27/2015 1450   CALCIUM 8.5 11/04/2013 1257   ALKPHOS 30 (L) 12/27/2015 1450   ALKPHOS 64 11/04/2013 1257   AST 22 12/27/2015 1450   AST 25 11/04/2013 1257   ALT 18 12/27/2015 1450   ALT 20 11/04/2013 1257   BILITOT 0.7 12/27/2015 1450   BILITOT 0.5 11/04/2013 1257       RADIOGRAPHIC STUDIES: I have personally reviewed the radiological images as listed and agreed with the findings in the report. No results found.   ASSESSMENT & PLAN:  Endometrial cancer ([place]) Metastatic endometrial cancer to the bone- currently on Megace. Tolerating it well. Continue Megace. PET- AUG 22nd- Improved; but right hip pain getting worse;  Will get PET asap; will plan Rt if getting worse; if cannot do RT then recommend palliative chemotherapy- ? Carbo-taxol.   # Swelling of the left lower extremity; Aug 2017- Dopplers of the lower extremity DVT extensive; currently improved. [see discussion below].Eliquis  5 mg BID.  # Metastatic lesions to the bone on X-geva- every 4 weeks. Tolerating well.  New pain prescription given for fentanyl and also Percocet.  # Recent fall- recommend walking with cane.   # follow up few days after the results of the PET scan.   Orders Placed This Encounter  Procedures  . NM PET Image Restag (PS) Skull Base To Thigh    Standing Status:   Future    Standing Expiration Date:   02/25/2017    Order Specific Question:   Reason for Exam (SYMPTOM  OR DIAGNOSIS REQUIRED)    Answer:    endometrial cancer    Order Specific Question:   Preferred imaging location?    Answer:   [facility]      Cammie Sickle, MD 12/27/2015 4:52 PM

## 2015-12-27 NOTE — Assessment & Plan Note (Addendum)
Metastatic endometrial cancer to the bone- currently on Megace. Tolerating it well. Continue Megace. PET- AUG 22nd- Improved; but right hip pain getting worse;  Will get PET asap; will plan Rt if getting worse; if cannot do RT then recommend palliative chemotherapy- ? Carbo-taxol.   # Swelling of the left lower extremity; Aug 2017- Dopplers of the lower extremity DVT extensive; currently improved. [see discussion below].Eliquis  5 mg BID.  # Metastatic lesions to the bone on X-geva- every 4 weeks. Tolerating well.  New pain prescription given for fentanyl and also Percocet.  # Recent fall- recommend walking with cane.   # follow up few days after the results of the PET scan.

## 2015-12-28 LAB — CA 125: CA 125: 19.3 U/mL (ref 0.0–38.1)

## 2016-01-03 ENCOUNTER — Ambulatory Visit
Admission: RE | Admit: 2016-01-03 | Discharge: 2016-01-03 | Disposition: A | Discharge status: Home / Self Care | Payer: Medicare Other | Source: Ambulatory Visit | Attending: Internal Medicine | Admitting: Internal Medicine

## 2016-01-03 DIAGNOSIS — C541 Malignant neoplasm of endometrium: Secondary | ICD-10-CM | POA: Diagnosis present

## 2016-01-03 DIAGNOSIS — C7951 Secondary malignant neoplasm of bone: Secondary | ICD-10-CM | POA: Diagnosis not present

## 2016-01-03 DIAGNOSIS — R599 Enlarged lymph nodes, unspecified: Secondary | ICD-10-CM | POA: Insufficient documentation

## 2016-01-03 LAB — GLUCOSE, CAPILLARY: Glucose-Capillary: 78 mg/dL (ref 65–99)

## 2016-01-03 MED ORDER — FLUDEOXYGLUCOSE F - 18 (FDG) INJECTION
12.1100 | Freq: Once | INTRAVENOUS | Status: AC
  Administered 2016-01-03: 12.11 via INTRAVENOUS

## 2016-01-05 ENCOUNTER — Inpatient Hospital Stay (HOSPITAL_BASED_OUTPATIENT_CLINIC_OR_DEPARTMENT_OTHER): Payer: Medicare Other | Admitting: Internal Medicine

## 2016-01-05 VITALS — BP 131/77 | HR 61 | Temp 97.8°F | Resp 18 | Wt 126.4 lb

## 2016-01-05 DIAGNOSIS — Z803 Family history of malignant neoplasm of breast: Secondary | ICD-10-CM

## 2016-01-05 DIAGNOSIS — Z170 Estrogen receptor positive status [ER+]: Secondary | ICD-10-CM | POA: Diagnosis not present

## 2016-01-05 DIAGNOSIS — C7951 Secondary malignant neoplasm of bone: Secondary | ICD-10-CM

## 2016-01-05 DIAGNOSIS — Z86711 Personal history of pulmonary embolism: Secondary | ICD-10-CM

## 2016-01-05 DIAGNOSIS — N189 Chronic kidney disease, unspecified: Secondary | ICD-10-CM

## 2016-01-05 DIAGNOSIS — C541 Malignant neoplasm of endometrium: Secondary | ICD-10-CM | POA: Diagnosis not present

## 2016-01-05 DIAGNOSIS — Z923 Personal history of irradiation: Secondary | ICD-10-CM

## 2016-01-05 DIAGNOSIS — M25551 Pain in right hip: Secondary | ICD-10-CM

## 2016-01-05 DIAGNOSIS — Z90722 Acquired absence of ovaries, bilateral: Secondary | ICD-10-CM

## 2016-01-05 DIAGNOSIS — C50812 Malignant neoplasm of overlapping sites of left female breast: Secondary | ICD-10-CM | POA: Diagnosis not present

## 2016-01-05 DIAGNOSIS — C7911 Secondary malignant neoplasm of bladder: Secondary | ICD-10-CM | POA: Diagnosis not present

## 2016-01-05 DIAGNOSIS — Z8673 Personal history of transient ischemic attack (TIA), and cerebral infarction without residual deficits: Secondary | ICD-10-CM

## 2016-01-05 DIAGNOSIS — K219 Gastro-esophageal reflux disease without esophagitis: Secondary | ICD-10-CM

## 2016-01-05 DIAGNOSIS — Z90710 Acquired absence of both cervix and uterus: Secondary | ICD-10-CM

## 2016-01-05 DIAGNOSIS — Z7901 Long term (current) use of anticoagulants: Secondary | ICD-10-CM

## 2016-01-05 DIAGNOSIS — Z79899 Other long term (current) drug therapy: Secondary | ICD-10-CM

## 2016-01-05 DIAGNOSIS — E889 Metabolic disorder, unspecified: Secondary | ICD-10-CM

## 2016-01-05 DIAGNOSIS — Z8541 Personal history of malignant neoplasm of cervix uteri: Secondary | ICD-10-CM

## 2016-01-05 DIAGNOSIS — Z86010 Personal history of colonic polyps: Secondary | ICD-10-CM

## 2016-01-05 DIAGNOSIS — M1990 Unspecified osteoarthritis, unspecified site: Secondary | ICD-10-CM

## 2016-01-05 DIAGNOSIS — Z9221 Personal history of antineoplastic chemotherapy: Secondary | ICD-10-CM

## 2016-01-05 DIAGNOSIS — G893 Neoplasm related pain (acute) (chronic): Secondary | ICD-10-CM

## 2016-01-05 DIAGNOSIS — Z86718 Personal history of other venous thrombosis and embolism: Secondary | ICD-10-CM

## 2016-01-05 DIAGNOSIS — Z880 Allergy status to penicillin: Secondary | ICD-10-CM

## 2016-01-05 NOTE — Progress Notes (Signed)
Patient is here for follow up on results

## 2016-01-05 NOTE — Progress Notes (Signed)
[place] OFFICE PROGRESS NOTE  Patient Care Team: Idelle Crouch, MD as PCP - General (Unknown Physician Specialty) [name], MD as Referring Physician (Surgery) [name], MD as Consulting Physician (Urology)  Cancer of left breast [facility])   Staging form: Breast, AJCC 7th Edition     Clinical: Stage IA (T1b, N0, M0) - Signed by Forest Gleason, MD on 10/20/2014    Oncology History   . Patient has a previous history of endometrium carcinoma of endometrium stage IB disease status post bilateral salpingo-oophorectomy and radiation therapy 4. Right hydronephrosis detected on MRI scan off lumbosacral spine. Cystoscopy with attempted stent placement revealed bladder tumor in September of 2016 biopsies positive for recurrent endometrial cancer  5. Patient had stent placement in the right kidney Started radiation therapy 6. Patient has finished radiation therapy with relief in the pain (November, 2016) 7. Started on Megace in December of 2016 8 deep vein thrombosis in the left lower extremity (February 08, 2014) Hematuria due to xeralto IVC filter placement on February 11, 2015 She is off xeralto 9.condition recently had a cystoscopy no evidence of bleeding was found so patient has been started on ELOQUIS (March 15, 2015)  10. Patient again had a significant bleeding with hematuria with eloquis and has been discontinued; Patient is on Plavix. ON  Megace. AUG 2017- PET IMPROVED; mediastinal uptake- monitor for now.   # August 25 2015- LLE DVT- start Eliquis [stop plavix]; PET  FEB 2017-LEFT BREAST CA [9'0 clock- overlapping site] Estrogen receptor positive. Progesterone receptor positive. HER-2 receptor; Her 2neg; 5 mm tumor clinically stage IB N0 M0 tumor;s/p Lumpectomy; NO RT  # PET NOV 27th- STABLE right iliac lesions [increasing pain]/ right paratrac- refer to RT  # X-geva q 4 w  # MOLECULAR STUDIES: 08/23/15- MMR-STABLE.      Cancer of  left breast ([place])   10/20/2014 Initial Diagnosis    Cancer of left breast       Endometrial cancer ([place])   11/18/2014 Initial Diagnosis    Endometrial cancer ([place])        INTERVAL HISTORY:  Traci [name] 82 y.o.  female pleasant patient above history of Metastatic endometrial cancer currently on Megace- Is here for follow-up/to review the results of the PET scan/accompanied by her son and husband.  Patient continues to complain of of pain worsening in the right hip- Patient is on fentanyl patch and also taking Percocet twice a day. Pain is intermittent. Worse with movement. .  Patient denies any abdominal pain. Denies any new onset of bone pain. Appetite is good. Denies any jaw pain. No chest pain shortness of the cough.   REVIEW OF SYSTEMS:  A complete 10 point review of system is done which is negative except mentioned above/history of present illness.   PAST MEDICAL HISTORY :  Past Medical History:  Diagnosis Date  . Arthritis    Degenerative Disc Disease  . Bone cancer ([place])   . Breast cancer ([place]) 2016   LT LUMPECTOMY 03-2015  . Chronic kidney disease    HYDRONEPHROSIS  . Deep vein blood clot of left lower extremity ([place]) February 11, 2015   Left Leg  . Endometrial adenocarcinoma ([place]) 01/2012    stage Ib, grade 1, positive peritoneal cytology, no other risk factors or extra-uterine disease  . Gastric ulcer   . GERD (gastroesophageal reflux disease)   . Hearing loss   . Hiccups   . History of brachytherapy   .  History of colon polyps   . History of DVT (deep vein thrombosis)   . History of esophageal stricture   . History of shingles   . Inguinal lymphocyst   . Mild aphasia   . Pain    SEVERE CHRONIC RIGHT LEG,HIP,FOOT  . PE (pulmonary embolism)    H/O  . Stroke ([place]) 04/06/2011   residual:  aphasia    PAST SURGICAL HISTORY :   Past Surgical History:  Procedure Laterality Date  . ABDOMINAL HYSTERECTOMY  December 2013   history of cervical cancer  .  BACK SURGERY     fusion of L3&4  . BREAST BIOPSY Left 10/13/2014   POS  . CATARACT EXTRACTION W/ INTRAOCULAR LENS IMPLANT     right  . CYSTOSCOPY W/ RETROGRADES Right 10/28/2014   Procedure: CYSTOSCOPY WITH RETROGRADE PYELOGRAM ATTEMPT, UNABLE TO PASS ;  Surgeon: [name], MD;  Location: [facility];  Service: Urology;  Laterality: Right;  . CYSTOSCOPY W/ RETROGRADES Right 09/15/2015   Procedure: CYSTOSCOPY WITH RETROGRADE PYELOGRAM;  Surgeon: [name], MD;  Location: [facility];  Service: Urology;  Laterality: Right;  . CYSTOSCOPY W/ URETERAL STENT PLACEMENT Right 03/10/2015   Procedure: CYSTOSCOPY WITH STENT REPLACEMENT;  Surgeon: [name], MD;  Location: [facility];  Service: Urology;  Laterality: Right;  . CYSTOSCOPY W/ URETERAL STENT PLACEMENT Right 06/09/2015   Procedure: CYSTOSCOPY WITH STENT REPLACEMENT;  Surgeon: [name], MD;  Location: [facility];  Service: Urology;  Laterality: Right;  . CYSTOSCOPY W/ URETERAL STENT PLACEMENT Right 09/15/2015   Procedure: CYSTOSCOPY WITH STENT REPLACEMENT;  Surgeon: [name], MD;  Location: [facility];  Service: Urology;  Laterality: Right;  . CYSTOSCOPY WITH STENT PLACEMENT Right 10/28/2014   Procedure: BLADDER BIOPSY WITH FULGERATION ;  Surgeon: [name], MD;  Location: [facility];  Service: Urology;  Laterality: Right;  . ESOPHAGEAL DILATION    . EYE SURGERY Bilateral    Cataract Extraction  . PARTIAL MASTECTOMY WITH NEEDLE LOCALIZATION Left 03/30/2015   Procedure: PARTIAL MASTECTOMY WITH NEEDLE LOCALIZATION;  Surgeon: [name], MD;  Location: [facility];  Service: General;  Laterality: Left;  . PERIPHERAL VASCULAR CATHETERIZATION N/A 02/11/2015   Procedure: IVC Filter Insertion;  Surgeon: Algernon Huxley, MD;  Location: [facility];  Service: Cardiovascular;  Laterality: N/A;  . PORTACATH PLACEMENT Right 11/26/2014   Procedure: INSERTION PORT-A-CATH;  Surgeon: [name], MD;  Location:  [facility];  Service: General;  Laterality: Right;  . SENTINEL NODE BIOPSY Left 03/30/2015   Procedure: SENTINEL NODE BIOPSY;  Surgeon: [name], MD;  Location: [facility];  Service: General;  Laterality: Left;  . thyroid removed    . THYROIDECTOMY, PARTIAL    . TONSILLECTOMY    . TOTAL ABDOMINAL HYSTERECTOMY W/ BILATERAL SALPINGOOPHORECTOMY Bilateral    staging biopsies    FAMILY HISTORY :   Family History  Problem Relation Age of Onset  . Stroke Mother   . Breast cancer Maternal Aunt     x 2    SOCIAL HISTORY:   Social History  Substance Use Topics  . Smoking status: Never Smoker  . Smokeless tobacco: Never Used  . Alcohol use No    ALLERGIES:  is allergic to penicillins; acetaminophen; hydrocodone-acetaminophen; novocain [procaine]; and sulfa antibiotics.  MEDICATIONS:  Current Outpatient Prescriptions  Medication Sig Dispense Refill  . apixaban (ELIQUIS) 5 MG TABS tablet Take 1 tablet (5 mg total) by mouth 2 (two) times daily.  60 tablet 5  . fentaNYL (DURAGESIC - DOSED MCG/HR) 50 MCG/HR Place 1 patch (50 mcg total) onto the skin every 3 (three) days. 10 patch 0  . gabapentin (NEURONTIN) 300 MG capsule TAKE ONE CAPSULE BY MOUTH THREE TIMES DAILY 90 capsule 2  . megestrol (MEGACE) 40 MG tablet Take 1 tablet (40 mg total) by mouth 4 (four) times daily. (Patient taking differently: Take 80 mg by mouth 2 (two) times daily. ) 120 tablet 3  . Multiple Vitamins-Minerals (MULTIVITAMIN WITH MINERALS) tablet Take 1 tablet by mouth daily. Reported on 06/30/2015    . oxyCODONE-acetaminophen (PERCOCET/ROXICET) 5-325 MG tablet Take 1 tablet by mouth every 6 (six) hours as needed for severe pain. (Patient taking differently: Take 2 tablets by mouth 2 (two) times daily. ) 90 tablet 0  . VESICARE 10 MG tablet Take 1 tablet by mouth daily.     No current facility-administered medications for this visit.     PHYSICAL EXAMINATION: ECOG PERFORMANCE STATUS: 1 - Symptomatic but completely  ambulatory  BP 131/77 (BP Location: Left Arm, Patient Position: Sitting)   Pulse 61   Temp 97.8 F (36.6 C) (Tympanic)   Resp 18   Wt 126 lb 6.4 oz (57.3 kg)   BMI 23.12 kg/m   Filed Weights   01/05/16 0944  Weight: 126 lb 6.4 oz (57.3 kg)    GENERAL: [place] Caucasian female patient walking herself Alert, no distress and comfortable. She Is accompanied by her husband/son.  EYES: no pallor or icterus OROPHARYNX: no thrush or ulceration; good dentition  NECK: supple, no masses felt LYMPH:  no palpable lymphadenopathy in the cervical, axillary or inguinal regions LUNGS: clear to auscultation and  No wheeze or crackles HEART/CVS: regular rate & rhythm and no murmurs; Left lower extremity swollen compared to the right.  ABDOMEN:abdomen soft, non-tender and normal bowel sounds Musculoskeletal:no cyanosis of digits and no clubbing  PSYCH: alert & oriented x 3 with fluent speech NEURO: no focal motor/sensory deficits SKIN:  no rashes or significant lesions  LABORATORY DATA:  I have reviewed the data as listed    Component Value Date/Time   NA 141 12/27/2015 1450   NA 139 11/04/2013 1257   K 3.9 12/27/2015 1450   K 4.3 03/05/2014 1156   CL 107 12/27/2015 1450   CL 106 11/04/2013 1257   CO2 25 12/27/2015 1450   CO2 26 11/04/2013 1257   GLUCOSE 105 (H) 12/27/2015 1450   GLUCOSE 96 11/04/2013 1257   BUN 27 (H) 12/27/2015 1450   BUN 13 11/04/2013 1257   CREATININE 1.10 (H) 12/27/2015 1450   CREATININE 0.90 11/04/2013 1257   CALCIUM 8.9 12/27/2015 1450   CALCIUM 8.5 11/04/2013 1257   PROT 6.5 12/27/2015 1450   PROT 6.8 11/04/2013 1257   ALBUMIN 3.9 12/27/2015 1450   ALBUMIN 3.5 11/04/2013 1257   AST 22 12/27/2015 1450   AST 25 11/04/2013 1257   ALT 18 12/27/2015 1450   ALT 20 11/04/2013 1257   ALKPHOS 30 (L) 12/27/2015 1450   ALKPHOS 64 11/04/2013 1257   BILITOT 0.7 12/27/2015 1450   BILITOT 0.5 11/04/2013 1257   GFRNONAA 45 (L) 12/27/2015 1450   GFRNONAA >60  11/04/2013 1257   GFRNONAA >60 03/29/2012 1650   GFRAA 52 (L) 12/27/2015 1450   GFRAA >60 11/04/2013 1257   GFRAA >60 03/29/2012 1650    No results found for: SPEP, UPEP  Lab Results  Component Value Date   WBC 6.6 12/27/2015   NEUTROABS 4.6  12/27/2015   HGB 14.3 12/27/2015   HCT 41.4 12/27/2015   MCV 95.6 12/27/2015   PLT 224 12/27/2015      Chemistry      Component Value Date/Time   NA 141 12/27/2015 1450   NA 139 11/04/2013 1257   K 3.9 12/27/2015 1450   K 4.3 03/05/2014 1156   CL 107 12/27/2015 1450   CL 106 11/04/2013 1257   CO2 25 12/27/2015 1450   CO2 26 11/04/2013 1257   BUN 27 (H) 12/27/2015 1450   BUN 13 11/04/2013 1257   CREATININE 1.10 (H) 12/27/2015 1450   CREATININE 0.90 11/04/2013 1257      Component Value Date/Time   CALCIUM 8.9 12/27/2015 1450   CALCIUM 8.5 11/04/2013 1257   ALKPHOS 30 (L) 12/27/2015 1450   ALKPHOS 64 11/04/2013 1257   AST 22 12/27/2015 1450   AST 25 11/04/2013 1257   ALT 18 12/27/2015 1450   ALT 20 11/04/2013 1257   BILITOT 0.7 12/27/2015 1450   BILITOT 0.5 11/04/2013 1257       RADIOGRAPHIC STUDIES: I have personally reviewed the radiological images as listed and agreed with the findings in the report. Nm Pet Image Restag (ps) Skull Base To Thigh  Result Date: 01/03/2016 CLINICAL DATA:  Subsequent treatment strategy for endometrial cancer. EXAM: NUCLEAR MEDICINE PET SKULL BASE TO THIGH TECHNIQUE: 12.1 mCi F-18 FDG was injected intravenously. Full-ring PET imaging was performed from the skull base to thigh after the radiotracer. CT data was obtained and used for attenuation correction and anatomic localization. FASTING BLOOD GLUCOSE:  Value: 78 mg/dl COMPARISON:  09/28/2015 and CT abdomen pelvis 04/26/2015. FINDINGS: NECK No hypermetabolic lymph nodes in the neck. CT images show no acute findings. CHEST Left thyroid is mildly hypermetabolic, as before, nonspecific. Low right paratracheal lymph node measures 7 mm (CT image 80)  with an SUV max of 4.7, stable from 09/28/2015. No additional hypermetabolic mediastinal, hilar or axillary lymph nodes. Right IJ Port-A-Cath terminates at the SVC RA junction. No pericardial or pleural effusion. ABDOMEN/PELVIS No abnormal hypermetabolism in the liver, adrenal glands, spleen or pancreas. No hypermetabolic lymph nodes. Liver, gallbladder, adrenal glands unremarkable. Right kidney is atrophic. Probable renal sinus cysts on the left. Spleen, pancreas, stomach and bowel are grossly unremarkable. IVC filter is in place. Small pelvic free fluid. SKELETON An expansile lytic lesion in the right iliac wing is unchanged, measuring 3.5 x 3.7 cm and with an SUV max of 3.1, as before. IMPRESSION: 1. Hypermetabolic low right paratracheal node and right iliac wing metastasis stable. 2. Small pelvic free fluid. Electronically Signed   By: Lorin Picket M.D.   On: 01/03/2016 15:59     ASSESSMENT & PLAN:  Endometrial cancer ([place]) Metastatic endometrial cancer to the bone- currently on Megace. Tolerating it well. PET- NOV 2017- Stable right pelvic mets/ right paratrach LN/ stable; but clinically complains of increasing pain. Will refer back to Dr.Crystal for palliative RT.  Continue Megace for now. I did discuss with the family that treatments are palliative/not curative.  # Swelling of the left lower extremity;s/p IVC filter.  Aug 2017- Dopplers of the lower extremity DVT extensive; Eliquis  5 mg BID.  # Metastatic lesions to the bone on X-geva- every 4 weeks. Tolerating well.  Continue prescription  fentanyl and also Percocet.  # DEc 18th for x-geva/labs/MD.    I reviewed the images myself and with the patient and family in detail.  Orders Placed This Encounter  Procedures  . CBC  with Differential/Platelet    Standing Status:   Future    Standing Expiration Date:   07/04/2016  . Comprehensive metabolic panel    Standing Status:   Future    Standing Expiration Date:   07/04/2016  . CA 125     Standing Status:   Future    Standing Expiration Date:   07/04/2016      Cammie Sickle, MD 01/05/2016 10:13 AM

## 2016-01-05 NOTE — Assessment & Plan Note (Addendum)
Metastatic endometrial cancer to the bone- currently on Megace. Tolerating it well. PET- NOV 2017- Stable right pelvic mets/ right paratrach LN/ stable; but clinically complains of increasing pain. Will refer back to Dr.Crystal for palliative RT.  Continue Megace for now. I did discuss with the family that treatments are palliative/not curative.  # Swelling of the left lower extremity;s/p IVC filter.  Aug 2017- Dopplers of the lower extremity DVT extensive; Eliquis  5 mg BID.  # Metastatic lesions to the bone on X-geva- every 4 weeks. Tolerating well.  Continue prescription  fentanyl and also Percocet.  # DEc 18th for x-geva/labs/MD.    I reviewed the images myself and with the patient and family in detail.

## 2016-01-07 ENCOUNTER — Ambulatory Visit
Admission: RE | Admit: 2016-01-07 | Discharge: 2016-01-07 | Disposition: A | Discharge status: Home / Self Care | Payer: Medicare Other | Source: Ambulatory Visit | Attending: Radiation Oncology | Admitting: Radiation Oncology

## 2016-01-07 DIAGNOSIS — C541 Malignant neoplasm of endometrium: Secondary | ICD-10-CM | POA: Diagnosis not present

## 2016-01-07 DIAGNOSIS — Z86718 Personal history of other venous thrombosis and embolism: Secondary | ICD-10-CM | POA: Insufficient documentation

## 2016-01-07 DIAGNOSIS — Z510 Encounter for antineoplastic radiation therapy: Secondary | ICD-10-CM | POA: Diagnosis not present

## 2016-01-07 DIAGNOSIS — Z923 Personal history of irradiation: Secondary | ICD-10-CM | POA: Diagnosis not present

## 2016-01-07 DIAGNOSIS — C7951 Secondary malignant neoplasm of bone: Secondary | ICD-10-CM | POA: Insufficient documentation

## 2016-01-07 DIAGNOSIS — Z90710 Acquired absence of both cervix and uterus: Secondary | ICD-10-CM | POA: Diagnosis not present

## 2016-01-07 DIAGNOSIS — Z90722 Acquired absence of ovaries, bilateral: Secondary | ICD-10-CM | POA: Diagnosis not present

## 2016-01-07 NOTE — Progress Notes (Signed)
Radiation Oncology Follow up Note  Name: Traci [name]   Date:   01/07/2016 MRN:  [id number] DOB: December 09, 1929    This 82 y.o. female presents to the clinic today for old patient new area with new site in right iliac wing from metastatic stage IV endometrial carcinoma.  REFERRING PROVIDER: Idelle Crouch, MD  HPI: Patient is a 82 year old female. Well-known to our department having been treated multiple times for endometrial carcinoma. Originally treated back in 2014 when she was treated with intravaginal brachytherapy for stage IB grade 1 endometrioid adenocarcinoma status post TAH/BSO. She went on to develop metastatic disease with a large lytic lesion in the right iliac crest for which we delivered palliative radiation therapy. Her palliative radiation therapy was in November 2016. She's been treated with Megace therapy developed a deep vein thrombosis in January 2016 and is currently on X [place] and Megace. She's been having marked increased pain in her right iliac wing and most recent PET CT scan shows hypermetabolic activity in a right paratracheal node as well as again the right iliac wing metastatic region. I've asked to reevaluate her for palliative radiation therapy to this area. She is ambulating with difficulty with a cane. Her pain now is narcotic dependent.  COMPLICATIONS OF TREATMENT: none  FOLLOW UP COMPLIANCE: keeps appointments   PHYSICAL EXAM:  There were no vitals taken for this visit. Elderly female in moderate pain and discomfort. Range of motion of her lower extremities does not elicit pain. Motor sensory and DTR levels are equal and symmetric bilaterally. She does have point tenderness in the right iliac wing. Well-developed well-nourished patient in NAD. HEENT reveals PERLA, EOMI, discs not visualized.  Oral cavity is clear. No oral mucosal lesions are identified. Neck is clear without evidence of cervical or supraclavicular adenopathy. Lungs are clear to A&P. Cardiac  examination is essentially unremarkable with regular rate and rhythm without murmur rub or thrill. Abdomen is benign with no organomegaly or masses noted. Motor sensory and DTR levels are equal and symmetric in the upper and lower extremities. Cranial nerves II through XII are grossly intact. Proprioception is intact. No peripheral adenopathy or edema is identified. No motor or sensory levels are noted. Crude visual fields are within normal range.  RADIOLOGY RESULTS: PET CT scan is reviewed compatible with the above-stated findings  PLAN: At the present time like to again treat her right iliac wing will use three-dimensional treatment planning to spare her small bowel and colon. We'll again treat to 3000 cGy in 10 fractions and we can spare pelvic contents. May need to dose reduce if there is a problem with our treatment planning constraints. Risks and benefits of treatment including radiating previous area, fatigue possible diarrhea and alteration of blood counts and skin reaction all were discussed in detail with the patient and her husband. Both compress my treatment plan well. I have set up treatment planning for first thing next week.  I would like to take this opportunity to thank you for allowing me to participate in the care of your patient.Armstead Peaks., MD

## 2016-01-10 ENCOUNTER — Ambulatory Visit
Admission: RE | Admit: 2016-01-10 | Discharge: 2016-01-10 | Disposition: A | Discharge status: Home / Self Care | Payer: Medicare Other | Source: Ambulatory Visit | Attending: Radiation Oncology | Admitting: Radiation Oncology

## 2016-01-10 DIAGNOSIS — C7951 Secondary malignant neoplasm of bone: Secondary | ICD-10-CM | POA: Diagnosis not present

## 2016-01-11 ENCOUNTER — Other Ambulatory Visit: Payer: Self-pay | Admitting: *Deleted

## 2016-01-11 ENCOUNTER — Telehealth: Payer: Self-pay | Admitting: *Deleted

## 2016-01-11 DIAGNOSIS — M7989 Other specified soft tissue disorders: Secondary | ICD-10-CM

## 2016-01-11 DIAGNOSIS — M79662 Pain in left lower leg: Secondary | ICD-10-CM

## 2016-01-11 DIAGNOSIS — C541 Malignant neoplasm of endometrium: Secondary | ICD-10-CM

## 2016-01-11 MED ORDER — GABAPENTIN 300 MG PO CAPS: 300.0000 mg | ORAL_CAPSULE | Freq: Three times a day (TID) | ORAL | 2 refills | Status: AC

## 2016-01-11 MED ORDER — APIXABAN 5 MG PO TABS: 5.0000 mg | ORAL_TABLET | Freq: Two times a day (BID) | ORAL | 5 refills | Status: DC

## 2016-01-11 NOTE — Telephone Encounter (Signed)
Asking for  A return call to get the extent of her disease, is hospice considered, is home health involved, prognosis. States she called her this morning and she seemed a little rattled and was unable to find her words

## 2016-01-12 DIAGNOSIS — C7951 Secondary malignant neoplasm of bone: Secondary | ICD-10-CM | POA: Diagnosis not present

## 2016-01-13 NOTE — Telephone Encounter (Signed)
Dr Rogue Bussing has been notified of case manager, Sonia Baller, concerns and will address them with the patients family.

## 2016-01-14 ENCOUNTER — Ambulatory Visit
Admission: RE | Admit: 2016-01-14 | Discharge: 2016-01-14 | Disposition: A | Discharge status: Home / Self Care | Payer: Medicare Other | Source: Ambulatory Visit | Attending: Radiation Oncology | Admitting: Radiation Oncology

## 2016-01-17 ENCOUNTER — Ambulatory Visit
Admission: RE | Admit: 2016-01-17 | Discharge: 2016-01-17 | Disposition: A | Discharge status: Home / Self Care | Payer: Medicare Other | Source: Ambulatory Visit | Attending: Radiation Oncology | Admitting: Radiation Oncology

## 2016-01-17 DIAGNOSIS — C7951 Secondary malignant neoplasm of bone: Secondary | ICD-10-CM | POA: Diagnosis not present

## 2016-01-18 ENCOUNTER — Ambulatory Visit
Admission: RE | Admit: 2016-01-18 | Discharge: 2016-01-18 | Disposition: A | Discharge status: Home / Self Care | Payer: Medicare Other | Source: Ambulatory Visit | Attending: Radiation Oncology | Admitting: Radiation Oncology

## 2016-01-18 DIAGNOSIS — C7951 Secondary malignant neoplasm of bone: Secondary | ICD-10-CM | POA: Diagnosis not present

## 2016-01-19 ENCOUNTER — Ambulatory Visit
Admission: RE | Admit: 2016-01-19 | Discharge: 2016-01-19 | Disposition: A | Discharge status: Home / Self Care | Payer: Medicare Other | Source: Ambulatory Visit | Attending: Radiation Oncology | Admitting: Radiation Oncology

## 2016-01-19 DIAGNOSIS — C7951 Secondary malignant neoplasm of bone: Secondary | ICD-10-CM | POA: Diagnosis not present

## 2016-01-20 ENCOUNTER — Ambulatory Visit
Admission: RE | Admit: 2016-01-20 | Discharge: 2016-01-20 | Disposition: A | Discharge status: Home / Self Care | Payer: Medicare Other | Source: Ambulatory Visit | Attending: Radiation Oncology | Admitting: Radiation Oncology

## 2016-01-20 DIAGNOSIS — C7951 Secondary malignant neoplasm of bone: Secondary | ICD-10-CM | POA: Diagnosis not present

## 2016-01-21 ENCOUNTER — Ambulatory Visit
Admission: RE | Admit: 2016-01-21 | Discharge: 2016-01-21 | Disposition: A | Discharge status: Home / Self Care | Payer: Medicare Other | Source: Ambulatory Visit | Attending: Radiation Oncology | Admitting: Radiation Oncology

## 2016-01-21 DIAGNOSIS — C7951 Secondary malignant neoplasm of bone: Secondary | ICD-10-CM | POA: Diagnosis not present

## 2016-01-24 ENCOUNTER — Ambulatory Visit
Admission: RE | Admit: 2016-01-24 | Discharge: 2016-01-24 | Disposition: A | Discharge status: Home / Self Care | Payer: Medicare Other | Source: Ambulatory Visit | Attending: Radiation Oncology | Admitting: Radiation Oncology

## 2016-01-24 ENCOUNTER — Inpatient Hospital Stay: Payer: Medicare Other

## 2016-01-24 ENCOUNTER — Inpatient Hospital Stay: Payer: Medicare Other | Attending: Internal Medicine | Admitting: Internal Medicine

## 2016-01-24 VITALS — BP 114/70 | HR 75 | Temp 98.2°F | Wt 122.2 lb

## 2016-01-24 DIAGNOSIS — N189 Chronic kidney disease, unspecified: Secondary | ICD-10-CM | POA: Diagnosis not present

## 2016-01-24 DIAGNOSIS — Z170 Estrogen receptor positive status [ER+]: Secondary | ICD-10-CM | POA: Diagnosis not present

## 2016-01-24 DIAGNOSIS — Z79899 Other long term (current) drug therapy: Secondary | ICD-10-CM | POA: Diagnosis not present

## 2016-01-24 DIAGNOSIS — C7911 Secondary malignant neoplasm of bladder: Secondary | ICD-10-CM | POA: Insufficient documentation

## 2016-01-24 DIAGNOSIS — R600 Localized edema: Secondary | ICD-10-CM | POA: Diagnosis not present

## 2016-01-24 DIAGNOSIS — C50812 Malignant neoplasm of overlapping sites of left female breast: Secondary | ICD-10-CM | POA: Insufficient documentation

## 2016-01-24 DIAGNOSIS — C7951 Secondary malignant neoplasm of bone: Secondary | ICD-10-CM | POA: Insufficient documentation

## 2016-01-24 DIAGNOSIS — Z86711 Personal history of pulmonary embolism: Secondary | ICD-10-CM | POA: Diagnosis not present

## 2016-01-24 DIAGNOSIS — Z8541 Personal history of malignant neoplasm of cervix uteri: Secondary | ICD-10-CM | POA: Diagnosis not present

## 2016-01-24 DIAGNOSIS — Z7901 Long term (current) use of anticoagulants: Secondary | ICD-10-CM | POA: Insufficient documentation

## 2016-01-24 DIAGNOSIS — K219 Gastro-esophageal reflux disease without esophagitis: Secondary | ICD-10-CM | POA: Diagnosis not present

## 2016-01-24 DIAGNOSIS — Z90710 Acquired absence of both cervix and uterus: Secondary | ICD-10-CM | POA: Diagnosis not present

## 2016-01-24 DIAGNOSIS — C541 Malignant neoplasm of endometrium: Secondary | ICD-10-CM

## 2016-01-24 DIAGNOSIS — M9080 Osteopathy in diseases classified elsewhere, unspecified site: Secondary | ICD-10-CM

## 2016-01-24 DIAGNOSIS — Z8673 Personal history of transient ischemic attack (TIA), and cerebral infarction without residual deficits: Secondary | ICD-10-CM | POA: Insufficient documentation

## 2016-01-24 DIAGNOSIS — Z86718 Personal history of other venous thrombosis and embolism: Secondary | ICD-10-CM | POA: Diagnosis not present

## 2016-01-24 DIAGNOSIS — Z923 Personal history of irradiation: Secondary | ICD-10-CM | POA: Insufficient documentation

## 2016-01-24 DIAGNOSIS — Z880 Allergy status to penicillin: Secondary | ICD-10-CM | POA: Diagnosis not present

## 2016-01-24 DIAGNOSIS — G893 Neoplasm related pain (acute) (chronic): Secondary | ICD-10-CM | POA: Insufficient documentation

## 2016-01-24 DIAGNOSIS — Z90722 Acquired absence of ovaries, bilateral: Secondary | ICD-10-CM | POA: Insufficient documentation

## 2016-01-24 DIAGNOSIS — Z9221 Personal history of antineoplastic chemotherapy: Secondary | ICD-10-CM | POA: Diagnosis not present

## 2016-01-24 DIAGNOSIS — M1990 Unspecified osteoarthritis, unspecified site: Secondary | ICD-10-CM | POA: Diagnosis not present

## 2016-01-24 DIAGNOSIS — E889 Metabolic disorder, unspecified: Secondary | ICD-10-CM | POA: Insufficient documentation

## 2016-01-24 DIAGNOSIS — Z803 Family history of malignant neoplasm of breast: Secondary | ICD-10-CM | POA: Diagnosis not present

## 2016-01-24 DIAGNOSIS — Z86010 Personal history of colonic polyps: Secondary | ICD-10-CM | POA: Insufficient documentation

## 2016-01-24 DIAGNOSIS — M898X9 Other specified disorders of bone, unspecified site: Secondary | ICD-10-CM

## 2016-01-24 LAB — CBC WITH DIFFERENTIAL/PLATELET
BASOS ABS: 0.1 10*3/uL (ref 0–0.1)
Basophils Relative: 1 %
EOS PCT: 3 %
Eosinophils Absolute: 0.1 10*3/uL (ref 0–0.7)
HEMATOCRIT: 42.2 % (ref 35.0–47.0)
HEMOGLOBIN: 14.8 g/dL (ref 12.0–16.0)
LYMPHS ABS: 0.9 10*3/uL — AB (ref 1.0–3.6)
LYMPHS PCT: 18 %
MCH: 33.6 pg (ref 26.0–34.0)
MCHC: 35.2 g/dL (ref 32.0–36.0)
MCV: 95.4 fL (ref 80.0–100.0)
Monocytes Absolute: 0.5 10*3/uL (ref 0.2–0.9)
Monocytes Relative: 9 %
NEUTROS ABS: 3.3 10*3/uL (ref 1.4–6.5)
NEUTROS PCT: 69 %
PLATELETS: 224 10*3/uL (ref 150–440)
RBC: 4.42 MIL/uL (ref 3.80–5.20)
RDW: 13.2 % (ref 11.5–14.5)
WBC: 4.9 10*3/uL (ref 3.6–11.0)

## 2016-01-24 LAB — COMPREHENSIVE METABOLIC PANEL
ALK PHOS: 30 U/L — AB (ref 38–126)
ALT: 15 U/L (ref 14–54)
AST: 20 U/L (ref 15–41)
Albumin: 3.8 g/dL (ref 3.5–5.0)
Anion gap: 6 (ref 5–15)
BUN: 23 mg/dL — AB (ref 6–20)
CALCIUM: 9 mg/dL (ref 8.9–10.3)
CHLORIDE: 108 mmol/L (ref 101–111)
CO2: 26 mmol/L (ref 22–32)
CREATININE: 1.11 mg/dL — AB (ref 0.44–1.00)
GFR calc Af Amer: 51 mL/min — ABNORMAL LOW (ref >60)
GFR, EST NON AFRICAN AMERICAN: 44 mL/min — AB (ref >60)
Glucose, Bld: 99 mg/dL (ref 65–99)
Potassium: 3.9 mmol/L (ref 3.5–5.1)
Sodium: 140 mmol/L (ref 135–145)
Total Bilirubin: 0.9 mg/dL (ref 0.3–1.2)
Total Protein: 6.6 g/dL (ref 6.5–8.1)

## 2016-01-24 MED ORDER — OXYCODONE-ACETAMINOPHEN 5-325 MG PO TABS: 2.0000 | ORAL_TABLET | Freq: Two times a day (BID) | ORAL | 0 refills | Status: DC

## 2016-01-24 MED ORDER — DENOSUMAB 120 MG/1.7ML ~~LOC~~ SOLN
120.0000 mg | Freq: Once | SUBCUTANEOUS | Status: AC
  Administered 2016-01-24: 120 mg via SUBCUTANEOUS
  Filled 2016-01-24: qty 1.7

## 2016-01-24 NOTE — Progress Notes (Signed)
Patient here today for follow up for Endometrial cancer.  No new concerns today.

## 2016-01-24 NOTE — Progress Notes (Signed)
[facility] OFFICE PROGRESS NOTE  Patient Care Team: Marguarite Arbour, MD as PCP - General (Unknown Physician Specialty) Nadeen Landau, MD as Referring Physician (Surgery) Hildred Laser, MD as Consulting Physician (Urology)  Cancer of left breast [facility])   Staging form: Breast, AJCC 7th Edition     Clinical: Stage IA (T1b, N0, M0) - Signed by Johney Maine, MD on 10/20/2014    Oncology History   . Patient has a previous history of endometrium carcinoma of endometrium stage IB disease status post bilateral salpingo-oophorectomy and radiation therapy 4. Right hydronephrosis detected on MRI scan off lumbosacral spine. Cystoscopy with attempted stent placement revealed bladder tumor in September of 2016 biopsies positive for recurrent endometrial cancer  5. Patient had stent placement in the right kidney Started radiation therapy 6. Patient has finished radiation therapy with relief in the pain (November, 2016) 7. Started on Megace in December of 2016 8 deep vein thrombosis in the left lower extremity (February 08, 2014) Hematuria due to xeralto IVC filter placement on February 11, 2015 She is off xeralto 9.condition recently had a cystoscopy no evidence of bleeding was found so patient has been started on ELOQUIS (March 15, 2015)  10. Patient again had a significant bleeding with hematuria with eloquis and has been discontinued; Patient is on Plavix. ON  Megace. AUG 2017- PET IMPROVED; mediastinal uptake- monitor for now.   # August 25 2015- LLE DVT- start Eliquis [stop plavix]; PET  FEB 2017-LEFT BREAST CA [9'0 clock- overlapping site] Estrogen receptor positive. Progesterone receptor positive. HER-2 receptor; Her 2neg; 5 mm tumor clinically stage IB N0 M0 tumor;s/p Lumpectomy; NO RT  # PET NOV 27th- STABLE right iliac lesions [increasing pain]/ right paratrac- refer to RT  # X-geva q 4 w  # MOLECULAR STUDIES: 08/23/15- MMR-STABLE.      Cancer of  left breast (HCC)   10/20/2014 Initial Diagnosis    Cancer of left breast       Endometrial cancer (HCC)   11/18/2014 Initial Diagnosis    Endometrial cancer (HCC)        INTERVAL HISTORY:  Traci [name] 82 y.o.  female pleasant patient above history of Metastatic endometrial cancer currently on Megace- Is here for follow-up/ Accompanied by her husband.  Patient is currently on radiation to the right hip; she has noted some improvement in the pain just in the last day or so. Patient continues to be on fentanyl patch; and also on Percocet.   Patient denies any abdominal pain. Denies any new onset of bone pain. Appetite is good. Denies any jaw pain. No chest pain shortness of the cough.  As per the husband just fairly active; driving and also going to church.  REVIEW OF SYSTEMS:  A complete 10 point review of system is done which is negative except mentioned above/history of present illness.   PAST MEDICAL HISTORY :  Past Medical History:  Diagnosis Date  . Arthritis    Degenerative Disc Disease  . Bone cancer (HCC)   . Breast cancer (HCC) 2016   LT LUMPECTOMY 03-2015  . Chronic kidney disease    HYDRONEPHROSIS  . Deep vein blood clot of left lower extremity (HCC) February 11, 2015   Left Leg  . Endometrial adenocarcinoma (HCC) 01/2012    stage Ib, grade 1, positive peritoneal cytology, no other risk factors or extra-uterine disease  . Gastric ulcer   . GERD (gastroesophageal reflux disease)   . Hearing loss   .  Hiccups   . History of brachytherapy   . History of colon polyps   . History of DVT (deep vein thrombosis)   . History of esophageal stricture   . History of shingles   . Inguinal lymphocyst   . Mild aphasia   . Pain    SEVERE CHRONIC RIGHT LEG,HIP,FOOT  . PE (pulmonary embolism)    H/O  . Stroke ([place]) 04/06/2011   residual:  aphasia    PAST SURGICAL HISTORY :   Past Surgical History:  Procedure Laterality Date  . ABDOMINAL HYSTERECTOMY  December 2013    history of cervical cancer  . BACK SURGERY     fusion of L3&4  . BREAST BIOPSY Left 10/13/2014   POS  . CATARACT EXTRACTION W/ INTRAOCULAR LENS IMPLANT     right  . CYSTOSCOPY W/ RETROGRADES Right 10/28/2014   Procedure: CYSTOSCOPY WITH RETROGRADE PYELOGRAM ATTEMPT, UNABLE TO PASS ;  Surgeon: [name], MD;  Location: [facility];  Service: Urology;  Laterality: Right;  . CYSTOSCOPY W/ RETROGRADES Right 09/15/2015   Procedure: CYSTOSCOPY WITH RETROGRADE PYELOGRAM;  Surgeon: [name], MD;  Location: [facility];  Service: Urology;  Laterality: Right;  . CYSTOSCOPY W/ URETERAL STENT PLACEMENT Right 03/10/2015   Procedure: CYSTOSCOPY WITH STENT REPLACEMENT;  Surgeon: [name], MD;  Location: [facility];  Service: Urology;  Laterality: Right;  . CYSTOSCOPY W/ URETERAL STENT PLACEMENT Right 06/09/2015   Procedure: CYSTOSCOPY WITH STENT REPLACEMENT;  Surgeon: [name], MD;  Location: [facility];  Service: Urology;  Laterality: Right;  . CYSTOSCOPY W/ URETERAL STENT PLACEMENT Right 09/15/2015   Procedure: CYSTOSCOPY WITH STENT REPLACEMENT;  Surgeon: [name], MD;  Location: [facility];  Service: Urology;  Laterality: Right;  . CYSTOSCOPY WITH STENT PLACEMENT Right 10/28/2014   Procedure: BLADDER BIOPSY WITH FULGERATION ;  Surgeon: [name], MD;  Location: [facility];  Service: Urology;  Laterality: Right;  . ESOPHAGEAL DILATION    . EYE SURGERY Bilateral    Cataract Extraction  . PARTIAL MASTECTOMY WITH NEEDLE LOCALIZATION Left 03/30/2015   Procedure: PARTIAL MASTECTOMY WITH NEEDLE LOCALIZATION;  Surgeon: [name], MD;  Location: [facility];  Service: General;  Laterality: Left;  . PERIPHERAL VASCULAR CATHETERIZATION N/A 02/11/2015   Procedure: IVC Filter Insertion;  Surgeon: Algernon Huxley, MD;  Location: [facility];  Service: Cardiovascular;  Laterality: N/A;  . PORTACATH PLACEMENT Right 11/26/2014   Procedure: INSERTION PORT-A-CATH;  Surgeon:  [name], MD;  Location: [facility];  Service: General;  Laterality: Right;  . SENTINEL NODE BIOPSY Left 03/30/2015   Procedure: SENTINEL NODE BIOPSY;  Surgeon: [name], MD;  Location: [facility];  Service: General;  Laterality: Left;  . thyroid removed    . THYROIDECTOMY, PARTIAL    . TONSILLECTOMY    . TOTAL ABDOMINAL HYSTERECTOMY W/ BILATERAL SALPINGOOPHORECTOMY Bilateral    staging biopsies    FAMILY HISTORY :   Family History  Problem Relation Age of Onset  . Stroke Mother   . Breast cancer Maternal Aunt     x 2    SOCIAL HISTORY:   Social History  Substance Use Topics  . Smoking status: Never Smoker  . Smokeless tobacco: Never Used  . Alcohol use No    ALLERGIES:  is allergic to penicillins; acetaminophen; hydrocodone-acetaminophen; novocain [procaine]; and sulfa antibiotics.  MEDICATIONS:  Current Outpatient Prescriptions  Medication Sig Dispense Refill  . apixaban (ELIQUIS) 5 MG TABS tablet Take 1  tablet (5 mg total) by mouth 2 (two) times daily. 60 tablet 5  . fentaNYL (DURAGESIC - DOSED MCG/HR) 50 MCG/HR Place 1 patch (50 mcg total) onto the skin every 3 (three) days. 10 patch 0  . gabapentin (NEURONTIN) 300 MG capsule Take 1 capsule (300 mg total) by mouth 3 (three) times daily. 90 capsule 2  . megestrol (MEGACE) 40 MG tablet Take 1 tablet (40 mg total) by mouth 4 (four) times daily. (Patient taking differently: Take 80 mg by mouth 2 (two) times daily. ) 120 tablet 3  . Multiple Vitamins-Minerals (MULTIVITAMIN WITH MINERALS) tablet Take 1 tablet by mouth daily. Reported on 06/30/2015    . oxyCODONE-acetaminophen (PERCOCET/ROXICET) 5-325 MG tablet Take 2 tablets by mouth 2 (two) times daily. 90 tablet 0  . VESICARE 10 MG tablet Take 1 tablet by mouth daily.     No current facility-administered medications for this visit.     PHYSICAL EXAMINATION: ECOG PERFORMANCE STATUS: 1 - Symptomatic but completely ambulatory  BP 114/70 (BP Location: Left  Arm, Patient Position: Sitting)   Pulse 75   Temp 98.2 F (36.8 C) (Tympanic)   Wt 122 lb 4 oz (55.5 kg)   BMI 22.36 kg/m   Filed Weights   01/24/16 1058  Weight: 122 lb 4 oz (55.5 kg)    GENERAL: Frail-appearing Caucasian female patient walking herself Alert, no distress and comfortable. She Is accompanied by her husband.    EYES: no pallor or icterus OROPHARYNX: no thrush or ulceration; good dentition  NECK: supple, no masses felt LYMPH:  no palpable lymphadenopathy in the cervical, axillary or inguinal regions LUNGS: clear to auscultation and  No wheeze or crackles HEART/CVS: regular rate & rhythm and no murmurs; Left lower extremity swollen compared to the right.  ABDOMEN:abdomen soft, non-tender and normal bowel sounds Musculoskeletal:no cyanosis of digits and no clubbing  PSYCH: alert & oriented x 3 with fluent speech NEURO: no focal motor/sensory deficits SKIN:  no rashes or significant lesions  LABORATORY DATA:  I have reviewed the data as listed    Component Value Date/Time   NA 140 01/24/2016 0955   NA 139 11/04/2013 1257   K 3.9 01/24/2016 0955   K 4.3 03/05/2014 1156   CL 108 01/24/2016 0955   CL 106 11/04/2013 1257   CO2 26 01/24/2016 0955   CO2 26 11/04/2013 1257   GLUCOSE 99 01/24/2016 0955   GLUCOSE 96 11/04/2013 1257   BUN 23 (H) 01/24/2016 0955   BUN 13 11/04/2013 1257   CREATININE 1.11 (H) 01/24/2016 0955   CREATININE 0.90 11/04/2013 1257   CALCIUM 9.0 01/24/2016 0955   CALCIUM 8.5 11/04/2013 1257   PROT 6.6 01/24/2016 0955   PROT 6.8 11/04/2013 1257   ALBUMIN 3.8 01/24/2016 0955   ALBUMIN 3.5 11/04/2013 1257   AST 20 01/24/2016 0955   AST 25 11/04/2013 1257   ALT 15 01/24/2016 0955   ALT 20 11/04/2013 1257   ALKPHOS 30 (L) 01/24/2016 0955   ALKPHOS 64 11/04/2013 1257   BILITOT 0.9 01/24/2016 0955   BILITOT 0.5 11/04/2013 1257   GFRNONAA 44 (L) 01/24/2016 0955   GFRNONAA >60 11/04/2013 1257   GFRNONAA >60 03/29/2012 1650   GFRAA 51 (L)  01/24/2016 0955   GFRAA >60 11/04/2013 1257   GFRAA >60 03/29/2012 1650    No results found for: SPEP, UPEP  Lab Results  Component Value Date   WBC 4.9 01/24/2016   NEUTROABS 3.3 01/24/2016   HGB 14.8 01/24/2016  HCT 42.2 01/24/2016   MCV 95.4 01/24/2016   PLT 224 01/24/2016      Chemistry      Component Value Date/Time   NA 140 01/24/2016 0955   NA 139 11/04/2013 1257   K 3.9 01/24/2016 0955   K 4.3 03/05/2014 1156   CL 108 01/24/2016 0955   CL 106 11/04/2013 1257   CO2 26 01/24/2016 0955   CO2 26 11/04/2013 1257   BUN 23 (H) 01/24/2016 0955   BUN 13 11/04/2013 1257   CREATININE 1.11 (H) 01/24/2016 0955   CREATININE 0.90 11/04/2013 1257      Component Value Date/Time   CALCIUM 9.0 01/24/2016 0955   CALCIUM 8.5 11/04/2013 1257   ALKPHOS 30 (L) 01/24/2016 0955   ALKPHOS 64 11/04/2013 1257   AST 20 01/24/2016 0955   AST 25 11/04/2013 1257   ALT 15 01/24/2016 0955   ALT 20 11/04/2013 1257   BILITOT 0.9 01/24/2016 0955   BILITOT 0.5 11/04/2013 1257       RADIOGRAPHIC STUDIES: I have personally reviewed the radiological images as listed and agreed with the findings in the report. No results found.   ASSESSMENT & PLAN:  Endometrial cancer ([place]) Metastatic endometrial cancer to the bone- currently on Megace. Tolerating it well. PET- NOV 2017- Stable right pelvic mets/ right paratrach LN/ stable; but clinically complains of increasing pain. Currently on palliative RT to right hip.   #  Continue Megace for now. I did discuss with the family that treatments are palliative/not curative. Case worker wanted to talk about hospice. But pt/family not interested. I reviewed the scans- has oligo-metatstatic disease.continue palliative treatments. As patient is currently maintaining decent functional status- I think patient is not ready for hospice at this time.  # Swelling of the left lower extremity;s/p IVC filter.  Aug 2017- Dopplers of the lower extremity DVT extensive;  Eliquis  5 mg BID.  # Metastatic lesions to the bone on X-geva- every 4 weeks. Tolerating well.  Continue prescription  fentanyl and also Percocet. New percocet given.   # Follow up in 4 weeks/ MD/Labs/X-geva.   Orders Placed This Encounter  Procedures  . CBC with Differential    Standing Status:   Future    Standing Expiration Date:   01/23/2017  . Comprehensive metabolic panel    Standing Status:   Future    Standing Expiration Date:   01/23/2017  . CA 125    Standing Status:   Future    Standing Expiration Date:   01/23/2017      Cammie Sickle, MD 01/25/2016 8:13 AM

## 2016-01-24 NOTE — Assessment & Plan Note (Addendum)
Metastatic endometrial cancer to the bone- currently on Megace. Tolerating it well. PET- NOV 2017- Stable right pelvic mets/ right paratrach LN/ stable; but clinically complains of increasing pain. Currently on palliative RT to right hip.   #  Continue Megace for now. I did discuss with the family that treatments are palliative/not curative. Case worker wanted to talk about hospice. But pt/family not interested. I reviewed the scans- has oligo-metatstatic disease.continue palliative treatments. As patient is currently maintaining decent functional status- I think patient is not ready for hospice at this time.  # Swelling of the left lower extremity;s/p IVC filter.  Aug 2017- Dopplers of the lower extremity DVT extensive; Eliquis  5 mg BID.  # Metastatic lesions to the bone on X-geva- every 4 weeks. Tolerating well.  Continue prescription  fentanyl and also Percocet. New percocet given.   # Follow up in 4 weeks/ MD/Labs/X-geva.

## 2016-01-25 ENCOUNTER — Ambulatory Visit
Admission: RE | Admit: 2016-01-25 | Discharge: 2016-01-25 | Disposition: A | Discharge status: Home / Self Care | Payer: Medicare Other | Source: Ambulatory Visit | Attending: Radiation Oncology | Admitting: Radiation Oncology

## 2016-01-25 DIAGNOSIS — C7951 Secondary malignant neoplasm of bone: Secondary | ICD-10-CM | POA: Diagnosis not present

## 2016-01-25 LAB — CA 125: CA 125: 16.2 U/mL (ref 0.0–38.1)

## 2016-01-26 ENCOUNTER — Ambulatory Visit
Admission: RE | Admit: 2016-01-26 | Discharge: 2016-01-26 | Disposition: A | Discharge status: Home / Self Care | Payer: Medicare Other | Source: Ambulatory Visit | Attending: Radiation Oncology | Admitting: Radiation Oncology

## 2016-01-26 DIAGNOSIS — C7951 Secondary malignant neoplasm of bone: Secondary | ICD-10-CM | POA: Diagnosis not present

## 2016-01-27 ENCOUNTER — Ambulatory Visit
Admission: RE | Admit: 2016-01-27 | Discharge: 2016-01-27 | Disposition: A | Discharge status: Home / Self Care | Payer: Medicare Other | Source: Ambulatory Visit | Attending: Radiation Oncology | Admitting: Radiation Oncology

## 2016-01-27 DIAGNOSIS — C7951 Secondary malignant neoplasm of bone: Secondary | ICD-10-CM | POA: Diagnosis not present

## 2016-01-28 ENCOUNTER — Ambulatory Visit
Admission: RE | Admit: 2016-01-28 | Discharge: 2016-01-28 | Disposition: A | Discharge status: Home / Self Care | Payer: Medicare Other | Source: Ambulatory Visit | Attending: Radiation Oncology | Admitting: Radiation Oncology

## 2016-01-28 DIAGNOSIS — C7951 Secondary malignant neoplasm of bone: Secondary | ICD-10-CM | POA: Diagnosis not present

## 2016-02-10 ENCOUNTER — Other Ambulatory Visit: Payer: Self-pay | Admitting: *Deleted

## 2016-02-10 ENCOUNTER — Ambulatory Visit
Admission: RE | Admit: 2016-02-10 | Discharge: 2016-02-10 | Disposition: A | Discharge status: Home / Self Care | Payer: Medicare Other | Source: Ambulatory Visit | Attending: Radiation Oncology | Admitting: Radiation Oncology

## 2016-02-10 ENCOUNTER — Encounter: Payer: Self-pay | Admitting: Radiation Oncology

## 2016-02-10 VITALS — BP 143/75 | HR 77 | Temp 98.0°F | Wt 123.1 lb

## 2016-02-10 DIAGNOSIS — C7951 Secondary malignant neoplasm of bone: Secondary | ICD-10-CM | POA: Diagnosis not present

## 2016-02-10 DIAGNOSIS — M84459S Pathological fracture, hip, unspecified, sequela: Secondary | ICD-10-CM | POA: Insufficient documentation

## 2016-02-10 DIAGNOSIS — C541 Malignant neoplasm of endometrium: Secondary | ICD-10-CM | POA: Insufficient documentation

## 2016-02-10 DIAGNOSIS — Z923 Personal history of irradiation: Secondary | ICD-10-CM | POA: Diagnosis not present

## 2016-02-10 NOTE — Progress Notes (Signed)
Radiation Oncology Follow up Note  Name: Traci [name]   Date:   02/10/2016 MRN:  [id number] DOB: 09/21/1929    This 83 y.o. female presents to the clinic today for self-referral for persistent pain in right hip.  REFERRING PROVIDER: Idelle Crouch, MD  HPI: Patient is a 83 year old female now out several weeks having completed palliative radiation therapy for the pathologic fracture of her right iliac wing from stage IV endometrial carcinoma. She called and requested to be seen stating that her pain is still bothersome. She's currently on fentanyl patch as well as narcotic analgesics. She is able to ambulate without significant pain. She's having no loss of motor or sensory levels in her lower extremities..  COMPLICATIONS OF TREATMENT: none  FOLLOW UP COMPLIANCE: keeps appointments   PHYSICAL EXAM:  BP (!) 143/75   Pulse 77   Temp 98 F (36.7 C)   Wt 123 lb 2 oz (55.8 kg)   BMI 22.52 kg/m  Range of motion of her lower extremities does not elicit pain motor sensory and DTR levels are equal and symmetric in the lower extremity bilaterally. Pain is elicited on point palpation to her right iliac wing. Well-developed well-nourished patient in NAD. HEENT reveals PERLA, EOMI, discs not visualized.  Oral cavity is clear. No oral mucosal lesions are identified. Neck is clear without evidence of cervical or supraclavicular adenopathy. Lungs are clear to A&P. Cardiac examination is essentially unremarkable with regular rate and rhythm without murmur rub or thrill. Abdomen is benign with no organomegaly or masses noted. Motor sensory and DTR levels are equal and symmetric in the upper and lower extremities. Cranial nerves II through XII are grossly intact. Proprioception is intact. No peripheral adenopathy or edema is identified. No motor or sensory levels are noted. Crude visual fields are within normal range.  RADIOLOGY RESULTS: Previous films and treatment films are reviewed  PLAN: At this  time I got over her films with the patient and her family. She does have a pathologic fracture of the right iliac wing I'm sure this is causing considerable pain. She continues on narcotic analgesics. I've explained to her may take some time for the pain to ease off wall this bone is trying to heal. I've asked her to limit her activities much is possible. She continues close follow-up care and pain management with medical oncology. I've asked to see her back in 3 months for follow-up.  I would like to take this opportunity to thank you for allowing me to participate in the care of your patient.Armstead Peaks., MD

## 2016-02-10 NOTE — Telephone Encounter (Signed)
Advised her that she has refills on her current Rx and she just needs to call [facility] for her med. She stated she will call them

## 2016-02-21 ENCOUNTER — Inpatient Hospital Stay: Payer: Medicare Other

## 2016-02-21 ENCOUNTER — Inpatient Hospital Stay: Payer: Medicare Other | Attending: Internal Medicine | Admitting: Internal Medicine

## 2016-02-21 VITALS — BP 116/67 | HR 69 | Temp 98.3°F | Wt 128.0 lb

## 2016-02-21 DIAGNOSIS — Z79899 Other long term (current) drug therapy: Secondary | ICD-10-CM | POA: Insufficient documentation

## 2016-02-21 DIAGNOSIS — Z9221 Personal history of antineoplastic chemotherapy: Secondary | ICD-10-CM | POA: Insufficient documentation

## 2016-02-21 DIAGNOSIS — Z86711 Personal history of pulmonary embolism: Secondary | ICD-10-CM | POA: Insufficient documentation

## 2016-02-21 DIAGNOSIS — Z8673 Personal history of transient ischemic attack (TIA), and cerebral infarction without residual deficits: Secondary | ICD-10-CM | POA: Insufficient documentation

## 2016-02-21 DIAGNOSIS — Z86718 Personal history of other venous thrombosis and embolism: Secondary | ICD-10-CM | POA: Diagnosis not present

## 2016-02-21 DIAGNOSIS — M1990 Unspecified osteoarthritis, unspecified site: Secondary | ICD-10-CM

## 2016-02-21 DIAGNOSIS — Z880 Allergy status to penicillin: Secondary | ICD-10-CM | POA: Diagnosis not present

## 2016-02-21 DIAGNOSIS — Z923 Personal history of irradiation: Secondary | ICD-10-CM | POA: Diagnosis not present

## 2016-02-21 DIAGNOSIS — C541 Malignant neoplasm of endometrium: Secondary | ICD-10-CM

## 2016-02-21 DIAGNOSIS — Z803 Family history of malignant neoplasm of breast: Secondary | ICD-10-CM | POA: Insufficient documentation

## 2016-02-21 DIAGNOSIS — Z90710 Acquired absence of both cervix and uterus: Secondary | ICD-10-CM | POA: Insufficient documentation

## 2016-02-21 DIAGNOSIS — C7951 Secondary malignant neoplasm of bone: Secondary | ICD-10-CM | POA: Insufficient documentation

## 2016-02-21 DIAGNOSIS — Z170 Estrogen receptor positive status [ER+]: Secondary | ICD-10-CM

## 2016-02-21 DIAGNOSIS — Z86010 Personal history of colonic polyps: Secondary | ICD-10-CM | POA: Diagnosis not present

## 2016-02-21 DIAGNOSIS — N189 Chronic kidney disease, unspecified: Secondary | ICD-10-CM | POA: Diagnosis not present

## 2016-02-21 DIAGNOSIS — Z90722 Acquired absence of ovaries, bilateral: Secondary | ICD-10-CM | POA: Diagnosis not present

## 2016-02-21 DIAGNOSIS — G893 Neoplasm related pain (acute) (chronic): Secondary | ICD-10-CM | POA: Insufficient documentation

## 2016-02-21 DIAGNOSIS — C7911 Secondary malignant neoplasm of bladder: Secondary | ICD-10-CM | POA: Diagnosis not present

## 2016-02-21 DIAGNOSIS — K219 Gastro-esophageal reflux disease without esophagitis: Secondary | ICD-10-CM | POA: Diagnosis not present

## 2016-02-21 DIAGNOSIS — Z8619 Personal history of other infectious and parasitic diseases: Secondary | ICD-10-CM | POA: Diagnosis not present

## 2016-02-21 DIAGNOSIS — C50812 Malignant neoplasm of overlapping sites of left female breast: Secondary | ICD-10-CM | POA: Insufficient documentation

## 2016-02-21 LAB — COMPREHENSIVE METABOLIC PANEL
ALBUMIN: 3.7 g/dL (ref 3.5–5.0)
ALK PHOS: 30 U/L — AB (ref 38–126)
ALT: 16 U/L (ref 14–54)
ANION GAP: 4 — AB (ref 5–15)
AST: 22 U/L (ref 15–41)
BILIRUBIN TOTAL: 0.7 mg/dL (ref 0.3–1.2)
BUN: 21 mg/dL — AB (ref 6–20)
CALCIUM: 8.6 mg/dL — AB (ref 8.9–10.3)
CO2: 27 mmol/L (ref 22–32)
Chloride: 108 mmol/L (ref 101–111)
Creatinine, Ser: 1.07 mg/dL — ABNORMAL HIGH (ref 0.44–1.00)
GFR calc Af Amer: 54 mL/min — ABNORMAL LOW (ref >60)
GFR calc non Af Amer: 46 mL/min — ABNORMAL LOW (ref >60)
Glucose, Bld: 108 mg/dL — ABNORMAL HIGH (ref 65–99)
POTASSIUM: 4.3 mmol/L (ref 3.5–5.1)
SODIUM: 139 mmol/L (ref 135–145)
TOTAL PROTEIN: 6.4 g/dL — AB (ref 6.5–8.1)

## 2016-02-21 LAB — CBC WITH DIFFERENTIAL/PLATELET
BASOS ABS: 0 10*3/uL (ref 0–0.1)
BASOS PCT: 1 %
EOS ABS: 0.2 10*3/uL (ref 0–0.7)
Eosinophils Relative: 4 %
HEMATOCRIT: 40.3 % (ref 35.0–47.0)
HEMOGLOBIN: 13.8 g/dL (ref 12.0–16.0)
Lymphocytes Relative: 18 %
Lymphs Abs: 0.9 10*3/uL — ABNORMAL LOW (ref 1.0–3.6)
MCH: 33 pg (ref 26.0–34.0)
MCHC: 34.3 g/dL (ref 32.0–36.0)
MCV: 96.2 fL (ref 80.0–100.0)
Monocytes Absolute: 0.5 10*3/uL (ref 0.2–0.9)
Monocytes Relative: 10 %
NEUTROS ABS: 3.4 10*3/uL (ref 1.4–6.5)
NEUTROS PCT: 67 %
Platelets: 211 10*3/uL (ref 150–440)
RBC: 4.19 MIL/uL (ref 3.80–5.20)
RDW: 13.5 % (ref 11.5–14.5)
WBC: 4.9 10*3/uL (ref 3.6–11.0)

## 2016-02-21 MED ORDER — OXYCODONE-ACETAMINOPHEN 10-325 MG PO TABS: ORAL_TABLET | ORAL | 0 refills | Status: DC

## 2016-02-21 MED ORDER — FENTANYL 75 MCG/HR TD PT72: 75.0000 ug | MEDICATED_PATCH | TRANSDERMAL | 0 refills | Status: DC

## 2016-02-21 MED ORDER — DENOSUMAB 120 MG/1.7ML ~~LOC~~ SOLN
120.0000 mg | Freq: Once | SUBCUTANEOUS | Status: AC
  Administered 2016-02-21: 120 mg via SUBCUTANEOUS
  Filled 2016-02-21: qty 1.7

## 2016-02-21 NOTE — Progress Notes (Signed)
Patient here today for follow up.  Patient has no new concerns, pt needs refills on pain medication

## 2016-02-21 NOTE — Assessment & Plan Note (Signed)
Metastatic endometrial cancer to the bone- currently on Megace. Tolerating it well. PET- NOV 2017- Stable right pelvic mets/ right paratrach LN/ stable; but clinically complains of increasing pain. Currently s/p palliative RT to right hip- not significantly better.   #  Continue Megace for now. I did discuss with the family that treatments are palliative/not curative.  # Pain poorly controlled- increase fentanyl patch to 75/ and percocet to 10 every 8 hours. New scripts given.   # Swelling of the left lower extremity;s/p IVC filter.  Aug 2017- Dopplers of the lower extremity DVT extensive; Eliquis  5 mg BID.  # Metastatic lesions to the bone on X-geva- every 4 weeks. Tolerating well.   # Port flushed every 8 weeks.   # labs/x-geva/port flush/MD.

## 2016-02-21 NOTE — Progress Notes (Signed)
[place] OFFICE PROGRESS NOTE  Patient Care Team: Idelle Crouch, MD as PCP - General (Unknown Physician Specialty) [name], MD as Referring Physician (Surgery) [name], MD as Consulting Physician (Urology)  Cancer of left breast [facility])   Staging form: Breast, AJCC 7th Edition     Clinical: Stage IA (T1b, N0, M0) - Signed by Forest Gleason, MD on 10/20/2014    Oncology History   . Patient has a previous history of endometrium carcinoma of endometrium stage IB disease status post bilateral salpingo-oophorectomy and radiation therapy 4. Right hydronephrosis detected on MRI scan off lumbosacral spine. Cystoscopy with attempted stent placement revealed bladder tumor in September of 2016 biopsies positive for recurrent endometrial cancer  5. Patient had stent placement in the right kidney Started radiation therapy 6. Patient has finished radiation therapy with relief in the pain (November, 2016) 7. Started on Megace in December of 2016 8 deep vein thrombosis in the left lower extremity (February 08, 2014) Hematuria due to xeralto IVC filter placement on February 11, 2015 She is off xeralto 9.condition recently had a cystoscopy no evidence of bleeding was found so patient has been started on ELOQUIS (March 15, 2015)  10. Patient again had a significant bleeding with hematuria with eloquis and has been discontinued; Patient is on Plavix. ON  Megace. AUG 2017- PET IMPROVED; mediastinal uptake- monitor for now.   # August 25 2015- LLE DVT- start Eliquis [stop plavix]; PET  FEB 2017-LEFT BREAST CA [9'0 clock- overlapping site] Estrogen receptor positive. Progesterone receptor positive. HER-2 receptor; Her 2neg; 5 mm tumor clinically stage IB N0 M0 tumor;s/p Lumpectomy; NO RT  # PET NOV 27th- STABLE right iliac lesions [increasing pain]/ right paratrac- refer to RT  # X-geva q 4 w  # MOLECULAR STUDIES: 08/23/15- MMR-STABLE.      Cancer of  left breast ([place])   10/20/2014 Initial Diagnosis    Cancer of left breast       Endometrial cancer ([place])   11/18/2014 Initial Diagnosis    Endometrial cancer ([place])        INTERVAL HISTORY:  Traci [name] 83 y.o.  female pleasant patient above history of Metastatic endometrial cancer currently on Megace- Is here for follow-up/ Accompanied by her husband.  Patient Finished radiation to the right hip in December 2017. Did not notice significant improvement in the pain.. Patient continues to be on fentanyl patch; and also on Percocet. No falls. No bleeding.  Patient denies any abdominal pain. Denies any new onset of bone pain. Appetite is good. Denies any jaw pain. No chest pain shortness of the cough. She is fairly active otherwise.   REVIEW OF SYSTEMS:  A complete 10 point review of system is done which is negative except mentioned above/history of present illness.   PAST MEDICAL HISTORY :  Past Medical History:  Diagnosis Date  . Arthritis    Degenerative Disc Disease  . Bone cancer ([place])   . Breast cancer ([place]) 2016   LT LUMPECTOMY 03-2015  . Chronic kidney disease    HYDRONEPHROSIS  . Deep vein blood clot of left lower extremity ([place]) February 11, 2015   Left Leg  . Endometrial adenocarcinoma ([place]) 01/2012    stage Ib, grade 1, positive peritoneal cytology, no other risk factors or extra-uterine disease  . Gastric ulcer   . GERD (gastroesophageal reflux disease)   . Hearing loss   . Hiccups   . History of brachytherapy   . History  of colon polyps   . History of DVT (deep vein thrombosis)   . History of esophageal stricture   . History of shingles   . Inguinal lymphocyst   . Mild aphasia   . Pain    SEVERE CHRONIC RIGHT LEG,HIP,FOOT  . PE (pulmonary embolism)    H/O  . Stroke ([place]) 04/06/2011   residual:  aphasia    PAST SURGICAL HISTORY :   Past Surgical History:  Procedure Laterality Date  . ABDOMINAL HYSTERECTOMY  December 2013   history of cervical cancer   . BACK SURGERY     fusion of L3&4  . BREAST BIOPSY Left 10/13/2014   POS  . CATARACT EXTRACTION W/ INTRAOCULAR LENS IMPLANT     right  . CYSTOSCOPY W/ RETROGRADES Right 10/28/2014   Procedure: CYSTOSCOPY WITH RETROGRADE PYELOGRAM ATTEMPT, UNABLE TO PASS ;  Surgeon: [name], MD;  Location: [facility];  Service: Urology;  Laterality: Right;  . CYSTOSCOPY W/ RETROGRADES Right 09/15/2015   Procedure: CYSTOSCOPY WITH RETROGRADE PYELOGRAM;  Surgeon: [name], MD;  Location: [facility];  Service: Urology;  Laterality: Right;  . CYSTOSCOPY W/ URETERAL STENT PLACEMENT Right 03/10/2015   Procedure: CYSTOSCOPY WITH STENT REPLACEMENT;  Surgeon: [name], MD;  Location: [facility];  Service: Urology;  Laterality: Right;  . CYSTOSCOPY W/ URETERAL STENT PLACEMENT Right 06/09/2015   Procedure: CYSTOSCOPY WITH STENT REPLACEMENT;  Surgeon: [name], MD;  Location: [facility];  Service: Urology;  Laterality: Right;  . CYSTOSCOPY W/ URETERAL STENT PLACEMENT Right 09/15/2015   Procedure: CYSTOSCOPY WITH STENT REPLACEMENT;  Surgeon: [name], MD;  Location: [facility];  Service: Urology;  Laterality: Right;  . CYSTOSCOPY WITH STENT PLACEMENT Right 10/28/2014   Procedure: BLADDER BIOPSY WITH FULGERATION ;  Surgeon: [name], MD;  Location: [facility];  Service: Urology;  Laterality: Right;  . ESOPHAGEAL DILATION    . EYE SURGERY Bilateral    Cataract Extraction  . PARTIAL MASTECTOMY WITH NEEDLE LOCALIZATION Left 03/30/2015   Procedure: PARTIAL MASTECTOMY WITH NEEDLE LOCALIZATION;  Surgeon: [name], MD;  Location: [facility];  Service: General;  Laterality: Left;  . PERIPHERAL VASCULAR CATHETERIZATION N/A 02/11/2015   Procedure: IVC Filter Insertion;  Surgeon: Algernon Huxley, MD;  Location: [place] [facility];  Service: Cardiovascular;  Laterality: N/A;  . PORTACATH PLACEMENT Right 11/26/2014   Procedure: INSERTION PORT-A-CATH;  Surgeon: [name], MD;   Location: [facility];  Service: General;  Laterality: Right;  . SENTINEL NODE BIOPSY Left 03/30/2015   Procedure: SENTINEL NODE BIOPSY;  Surgeon: [name], MD;  Location: [facility];  Service: General;  Laterality: Left;  . thyroid removed    . THYROIDECTOMY, PARTIAL    . TONSILLECTOMY    . TOTAL ABDOMINAL HYSTERECTOMY W/ BILATERAL SALPINGOOPHORECTOMY Bilateral    staging biopsies    FAMILY HISTORY :   Family History  Problem Relation Age of Onset  . Stroke Mother   . Breast cancer Maternal Aunt     x 2    SOCIAL HISTORY:   Social History  Substance Use Topics  . Smoking status: Never Smoker  . Smokeless tobacco: Never Used  . Alcohol use No    ALLERGIES:  is allergic to penicillins; acetaminophen; hydrocodone-acetaminophen; novocain [procaine]; and sulfa antibiotics.  MEDICATIONS:  Current Outpatient Prescriptions  Medication Sig Dispense Refill  . apixaban (ELIQUIS) 5 MG TABS tablet Take 1 tablet (5 mg total) by mouth 2 (two) times daily. [place]  tablet 5  . gabapentin (NEURONTIN) 300 MG capsule Take 1 capsule (300 mg total) by mouth 3 (three) times daily. 90 capsule 2  . megestrol (MEGACE) 40 MG tablet Take 1 tablet (40 mg total) by mouth 4 (four) times daily. (Patient taking differently: Take 80 mg by mouth 2 (two) times daily. ) 120 tablet 3  . Multiple Vitamins-Minerals (MULTIVITAMIN WITH MINERALS) tablet Take 1 tablet by mouth daily. Reported on 06/30/2015    . VESICARE 10 MG tablet Take 1 tablet by mouth daily.    . fentaNYL (DURAGESIC - DOSED MCG/HR) 75 MCG/HR Place 1 patch (75 mcg total) onto the skin every 3 (three) days. 10 patch 0  . oxyCODONE-acetaminophen (PERCOCET) 10-325 MG tablet One pill three times a day as needed for pain 90 tablet 0   No current facility-administered medications for this visit.     PHYSICAL EXAMINATION: ECOG PERFORMANCE STATUS: 1 - Symptomatic but completely ambulatory  BP 116/67 (BP Location: Left Arm, Patient Position: Sitting)    Pulse 69   Temp 98.3 F (36.8 C) (Tympanic)   Wt 128 lb (58.1 kg)   BMI 23.41 kg/m   Filed Weights   02/21/16 0947  Weight: 128 lb (58.1 kg)    GENERAL: [place] Caucasian female patient walking herself Alert, no distress and comfortable. She Is accompanied by her husband.    EYES: no pallor or icterus OROPHARYNX: no thrush or ulceration; good dentition  NECK: supple, no masses felt LYMPH:  no palpable lymphadenopathy in the cervical, axillary or inguinal regions LUNGS: clear to auscultation and  No wheeze or crackles HEART/CVS: regular rate & rhythm and no murmurs; Left lower extremity swollen compared to the right.  ABDOMEN:abdomen soft, non-tender and normal bowel sounds Musculoskeletal:no cyanosis of digits and no clubbing  PSYCH: alert & oriented x 3 with fluent speech NEURO: no focal motor/sensory deficits SKIN:  no rashes or significant lesions  LABORATORY DATA:  I have reviewed the data as listed    Component Value Date/Time   NA 139 02/21/2016 0905   NA 139 11/04/2013 1257   K 4.3 02/21/2016 0905   K 4.3 03/05/2014 1156   CL 108 02/21/2016 0905   CL 106 11/04/2013 1257   CO2 27 02/21/2016 0905   CO2 26 11/04/2013 1257   GLUCOSE 108 (H) 02/21/2016 0905   GLUCOSE 96 11/04/2013 1257   BUN 21 (H) 02/21/2016 0905   BUN 13 11/04/2013 1257   CREATININE 1.07 (H) 02/21/2016 0905   CREATININE 0.90 11/04/2013 1257   CALCIUM 8.6 (L) 02/21/2016 0905   CALCIUM 8.5 11/04/2013 1257   PROT 6.4 (L) 02/21/2016 0905   PROT 6.8 11/04/2013 1257   ALBUMIN 3.7 02/21/2016 0905   ALBUMIN 3.5 11/04/2013 1257   AST 22 02/21/2016 0905   AST 25 11/04/2013 1257   ALT 16 02/21/2016 0905   ALT 20 11/04/2013 1257   ALKPHOS 30 (L) 02/21/2016 0905   ALKPHOS 64 11/04/2013 1257   BILITOT 0.7 02/21/2016 0905   BILITOT 0.5 11/04/2013 1257   GFRNONAA 46 (L) 02/21/2016 0905   GFRNONAA >60 11/04/2013 1257   GFRNONAA >60 03/29/2012 1650   GFRAA 54 (L) 02/21/2016 0905   GFRAA >60  11/04/2013 1257   GFRAA >60 03/29/2012 1650    No results found for: SPEP, UPEP  Lab Results  Component Value Date   WBC 4.9 02/21/2016   NEUTROABS 3.4 02/21/2016   HGB 13.8 02/21/2016   HCT 40.3 02/21/2016   MCV 96.2 02/21/2016  PLT 211 02/21/2016      Chemistry      Component Value Date/Time   NA 139 02/21/2016 0905   NA 139 11/04/2013 1257   K 4.3 02/21/2016 0905   K 4.3 03/05/2014 1156   CL 108 02/21/2016 0905   CL 106 11/04/2013 1257   CO2 27 02/21/2016 0905   CO2 26 11/04/2013 1257   BUN 21 (H) 02/21/2016 0905   BUN 13 11/04/2013 1257   CREATININE 1.07 (H) 02/21/2016 0905   CREATININE 0.90 11/04/2013 1257      Component Value Date/Time   CALCIUM 8.6 (L) 02/21/2016 0905   CALCIUM 8.5 11/04/2013 1257   ALKPHOS 30 (L) 02/21/2016 0905   ALKPHOS 64 11/04/2013 1257   AST 22 02/21/2016 0905   AST 25 11/04/2013 1257   ALT 16 02/21/2016 0905   ALT 20 11/04/2013 1257   BILITOT 0.7 02/21/2016 0905   BILITOT 0.5 11/04/2013 1257       RADIOGRAPHIC STUDIES: I have personally reviewed the radiological images as listed and agreed with the findings in the report. No results found.   ASSESSMENT & PLAN:  Endometrial cancer ([place]) Metastatic endometrial cancer to the bone- currently on Megace. Tolerating it well. PET- NOV 2017- Stable right pelvic mets/ right paratrach LN/ stable; but clinically complains of increasing pain. Currently s/p palliative RT to right hip- not significantly better.   #  Continue Megace for now. I did discuss with the family that treatments are palliative/not curative.  # Pain poorly controlled- increase fentanyl patch to 75/ and percocet to 10 every 8 hours. New scripts given.   # Swelling of the left lower extremity;s/p IVC filter.  Aug 2017- Dopplers of the lower extremity DVT extensive; Eliquis  5 mg BID.  # Metastatic lesions to the bone on X-geva- every 4 weeks. Tolerating well.   # Port flushed every 8 weeks.   # labs/x-geva/port  flush/MD.   Orders Placed This Encounter  Procedures  . CBC with Differential    Standing Status:   Standing    Number of Occurrences:   20    Standing Expiration Date:   02/20/2017  . Comprehensive metabolic panel    Standing Status:   Standing    Number of Occurrences:   20    Standing Expiration Date:   02/20/2017      Cammie Sickle, MD 02/21/2016 11:21 AM

## 2016-02-22 LAB — CA 125: CA 125: 16.8 U/mL (ref 0.0–38.1)

## 2016-03-01 ENCOUNTER — Ambulatory Visit: Payer: Medicare Other | Admitting: Radiation Oncology

## 2016-03-20 ENCOUNTER — Inpatient Hospital Stay (HOSPITAL_BASED_OUTPATIENT_CLINIC_OR_DEPARTMENT_OTHER): Payer: Medicare Other | Admitting: Internal Medicine

## 2016-03-20 ENCOUNTER — Inpatient Hospital Stay: Payer: Medicare Other | Attending: Internal Medicine

## 2016-03-20 ENCOUNTER — Inpatient Hospital Stay: Payer: Medicare Other

## 2016-03-20 VITALS — BP 115/73 | HR 68 | Temp 97.5°F | Wt 126.4 lb

## 2016-03-20 DIAGNOSIS — Z803 Family history of malignant neoplasm of breast: Secondary | ICD-10-CM

## 2016-03-20 DIAGNOSIS — C50812 Malignant neoplasm of overlapping sites of left female breast: Secondary | ICD-10-CM | POA: Diagnosis not present

## 2016-03-20 DIAGNOSIS — Z86718 Personal history of other venous thrombosis and embolism: Secondary | ICD-10-CM | POA: Diagnosis not present

## 2016-03-20 DIAGNOSIS — M25551 Pain in right hip: Secondary | ICD-10-CM | POA: Diagnosis not present

## 2016-03-20 DIAGNOSIS — Z8673 Personal history of transient ischemic attack (TIA), and cerebral infarction without residual deficits: Secondary | ICD-10-CM | POA: Insufficient documentation

## 2016-03-20 DIAGNOSIS — Z86711 Personal history of pulmonary embolism: Secondary | ICD-10-CM | POA: Diagnosis not present

## 2016-03-20 DIAGNOSIS — C7951 Secondary malignant neoplasm of bone: Secondary | ICD-10-CM | POA: Insufficient documentation

## 2016-03-20 DIAGNOSIS — C541 Malignant neoplasm of endometrium: Secondary | ICD-10-CM

## 2016-03-20 DIAGNOSIS — Z9221 Personal history of antineoplastic chemotherapy: Secondary | ICD-10-CM | POA: Diagnosis not present

## 2016-03-20 DIAGNOSIS — Z8541 Personal history of malignant neoplasm of cervix uteri: Secondary | ICD-10-CM | POA: Insufficient documentation

## 2016-03-20 DIAGNOSIS — N189 Chronic kidney disease, unspecified: Secondary | ICD-10-CM | POA: Insufficient documentation

## 2016-03-20 DIAGNOSIS — Z880 Allergy status to penicillin: Secondary | ICD-10-CM

## 2016-03-20 DIAGNOSIS — Z86010 Personal history of colonic polyps: Secondary | ICD-10-CM

## 2016-03-20 DIAGNOSIS — Z90722 Acquired absence of ovaries, bilateral: Secondary | ICD-10-CM | POA: Insufficient documentation

## 2016-03-20 DIAGNOSIS — Z8619 Personal history of other infectious and parasitic diseases: Secondary | ICD-10-CM | POA: Insufficient documentation

## 2016-03-20 DIAGNOSIS — R600 Localized edema: Secondary | ICD-10-CM

## 2016-03-20 DIAGNOSIS — Z90710 Acquired absence of both cervix and uterus: Secondary | ICD-10-CM

## 2016-03-20 DIAGNOSIS — Z7901 Long term (current) use of anticoagulants: Secondary | ICD-10-CM | POA: Insufficient documentation

## 2016-03-20 DIAGNOSIS — Z79899 Other long term (current) drug therapy: Secondary | ICD-10-CM | POA: Insufficient documentation

## 2016-03-20 DIAGNOSIS — Z79818 Long term (current) use of other agents affecting estrogen receptors and estrogen levels: Secondary | ICD-10-CM | POA: Insufficient documentation

## 2016-03-20 DIAGNOSIS — C7911 Secondary malignant neoplasm of bladder: Secondary | ICD-10-CM | POA: Diagnosis not present

## 2016-03-20 DIAGNOSIS — K219 Gastro-esophageal reflux disease without esophagitis: Secondary | ICD-10-CM | POA: Insufficient documentation

## 2016-03-20 DIAGNOSIS — M1990 Unspecified osteoarthritis, unspecified site: Secondary | ICD-10-CM

## 2016-03-20 DIAGNOSIS — Z923 Personal history of irradiation: Secondary | ICD-10-CM

## 2016-03-20 DIAGNOSIS — N1330 Unspecified hydronephrosis: Secondary | ICD-10-CM

## 2016-03-20 DIAGNOSIS — Z170 Estrogen receptor positive status [ER+]: Secondary | ICD-10-CM | POA: Diagnosis not present

## 2016-03-20 DIAGNOSIS — Z79891 Long term (current) use of opiate analgesic: Secondary | ICD-10-CM | POA: Insufficient documentation

## 2016-03-20 LAB — COMPREHENSIVE METABOLIC PANEL
ALBUMIN: 3.7 g/dL (ref 3.5–5.0)
ALK PHOS: 29 U/L — AB (ref 38–126)
ALT: 15 U/L (ref 14–54)
ANION GAP: 4 — AB (ref 5–15)
AST: 21 U/L (ref 15–41)
BUN: 20 mg/dL (ref 6–20)
CO2: 26 mmol/L (ref 22–32)
Calcium: 8.6 mg/dL — ABNORMAL LOW (ref 8.9–10.3)
Chloride: 110 mmol/L (ref 101–111)
Creatinine, Ser: 1.09 mg/dL — ABNORMAL HIGH (ref 0.44–1.00)
GFR calc Af Amer: 52 mL/min — ABNORMAL LOW (ref >60)
GFR calc non Af Amer: 45 mL/min — ABNORMAL LOW (ref >60)
GLUCOSE: 100 mg/dL — AB (ref 65–99)
Potassium: 3.7 mmol/L (ref 3.5–5.1)
SODIUM: 140 mmol/L (ref 135–145)
Total Bilirubin: 0.9 mg/dL (ref 0.3–1.2)
Total Protein: 6.5 g/dL (ref 6.5–8.1)

## 2016-03-20 LAB — CBC WITH DIFFERENTIAL/PLATELET
BASOS PCT: 2 %
Basophils Absolute: 0.1 10*3/uL (ref 0–0.1)
EOS ABS: 0.2 10*3/uL (ref 0–0.7)
Eosinophils Relative: 5 %
HCT: 40.6 % (ref 35.0–47.0)
HEMOGLOBIN: 14.1 g/dL (ref 12.0–16.0)
Lymphocytes Relative: 20 %
Lymphs Abs: 0.9 10*3/uL — ABNORMAL LOW (ref 1.0–3.6)
MCH: 33.2 pg (ref 26.0–34.0)
MCHC: 34.9 g/dL (ref 32.0–36.0)
MCV: 95.2 fL (ref 80.0–100.0)
MONOS PCT: 9 %
Monocytes Absolute: 0.4 10*3/uL (ref 0.2–0.9)
NEUTROS PCT: 64 %
Neutro Abs: 3.1 10*3/uL (ref 1.4–6.5)
Platelets: 236 10*3/uL (ref 150–440)
RBC: 4.26 MIL/uL (ref 3.80–5.20)
RDW: 13.5 % (ref 11.5–14.5)
WBC: 4.8 10*3/uL (ref 3.6–11.0)

## 2016-03-20 MED ORDER — DENOSUMAB 120 MG/1.7ML ~~LOC~~ SOLN
120.0000 mg | Freq: Once | SUBCUTANEOUS | Status: AC
  Administered 2016-03-20: 120 mg via SUBCUTANEOUS
  Filled 2016-03-20: qty 1.7

## 2016-03-20 MED ORDER — FENTANYL 75 MCG/HR TD PT72: 75.0000 ug | MEDICATED_PATCH | TRANSDERMAL | 0 refills | Status: DC

## 2016-03-20 NOTE — Progress Notes (Signed)
Patient here today for follow up.  Patient not certain of current medications, Pt's PCP medication list shows Plavix as a current medication, we do not have this medication on our end, we have Eliquis as a current medication, PCP does not show Eliquis as a current medication.

## 2016-03-20 NOTE — Progress Notes (Signed)
[place] OFFICE PROGRESS NOTE  Patient Care Team: Idelle Crouch, MD as PCP - General (Unknown Physician Specialty) [name], MD as Referring Physician (Surgery) [name], MD as Consulting Physician (Urology)  Cancer of left breast [facility])   Staging form: Breast, AJCC 7th Edition     Clinical: Stage IA (T1b, N0, M0) - Signed by Forest Gleason, MD on 10/20/2014    Oncology History   . Patient has a previous history of endometrium carcinoma of endometrium stage IB disease status post bilateral salpingo-oophorectomy and radiation therapy 4. Right hydronephrosis detected on MRI scan off lumbosacral spine. Cystoscopy with attempted stent placement revealed bladder tumor in September of 2016 biopsies positive for recurrent endometrial cancer  5. Patient had stent placement in the right kidney Started radiation therapy 6. Patient has finished radiation therapy with relief in the pain (November, 2016) 7. Started on Megace in December of 2016 8 deep vein thrombosis in the left lower extremity (February 08, 2014) Hematuria due to xeralto IVC filter placement on February 11, 2015 She is off xeralto 9.condition recently had a cystoscopy no evidence of bleeding was found so patient has been started on ELOQUIS (March 15, 2015)  10. Patient again had a significant bleeding with hematuria with eloquis and has been discontinued; Patient is on Plavix. ON  Megace. AUG 2017- PET IMPROVED; mediastinal uptake- monitor for now.   # August 25 2015- LLE DVT- start Eliquis [stop plavix]; PET  FEB 2017-LEFT BREAST CA [9'0 clock- overlapping site] Estrogen receptor positive. Progesterone receptor positive. HER-2 receptor; Her 2neg; 5 mm tumor clinically stage IB N0 M0 tumor;s/p Lumpectomy; NO RT  # PET NOV 27th- STABLE right iliac lesions [increasing pain]/ right paratrac- refer to RT  # X-geva q 4 w  # MOLECULAR STUDIES: 08/23/15- MMR-STABLE.      Cancer  of left breast ([place])   10/20/2014 Initial Diagnosis    Cancer of left breast       Endometrial cancer ([place])   11/18/2014 Initial Diagnosis    Endometrial cancer ([place])        INTERVAL HISTORY:  [name] 83 y.o.  female pleasant patient above history of Metastatic endometrial cancer currently on Megace- Is here for follow-up/ Accompanied by her husband.  Patient's pain in the right hip has improved since being on the higher dose of fentanyl patch. She is taking breakthrough pain medication only as needed. No falls. No bleeding.  Patient denies any abdominal pain. Denies any new onset of bone pain. Appetite is good. Denies any jaw pain. No chest pain shortness of the cough. She is fairly active otherwise.   REVIEW OF SYSTEMS:  A complete 10 point review of system is done which is negative except mentioned above/history of present illness.   PAST MEDICAL HISTORY :  Past Medical History:  Diagnosis Date  . Arthritis    Degenerative Disc Disease  . Bone cancer ([place])   . Breast cancer ([place]) 2016   LT LUMPECTOMY 03-2015  . Chronic kidney disease    HYDRONEPHROSIS  . Deep vein blood clot of left lower extremity ([place]) February 11, 2015   Left Leg  . Endometrial adenocarcinoma ([place]) 01/2012    stage Ib, grade 1, positive peritoneal cytology, no other risk factors or extra-uterine disease  . Gastric ulcer   . GERD (gastroesophageal reflux disease)   . Hearing loss   . Hiccups   . History of brachytherapy   . History of colon  polyps   . History of DVT (deep vein thrombosis)   . History of esophageal stricture   . History of shingles   . Inguinal lymphocyst   . Mild aphasia   . Pain    SEVERE CHRONIC RIGHT LEG,HIP,FOOT  . PE (pulmonary embolism)    H/O  . Stroke ([place]) 04/06/2011   residual:  aphasia    PAST SURGICAL HISTORY :   Past Surgical History:  Procedure Laterality Date  . ABDOMINAL HYSTERECTOMY  December 2013   history of cervical cancer  . BACK SURGERY      fusion of L3&4  . BREAST BIOPSY Left 10/13/2014   POS  . CATARACT EXTRACTION W/ INTRAOCULAR LENS IMPLANT     right  . CYSTOSCOPY W/ RETROGRADES Right 10/28/2014   Procedure: CYSTOSCOPY WITH RETROGRADE PYELOGRAM ATTEMPT, UNABLE TO PASS ;  Surgeon: [name], MD;  Location: [facility];  Service: Urology;  Laterality: Right;  . CYSTOSCOPY W/ RETROGRADES Right 09/15/2015   Procedure: CYSTOSCOPY WITH RETROGRADE PYELOGRAM;  Surgeon: [name], MD;  Location: [facility];  Service: Urology;  Laterality: Right;  . CYSTOSCOPY W/ URETERAL STENT PLACEMENT Right 03/10/2015   Procedure: CYSTOSCOPY WITH STENT REPLACEMENT;  Surgeon: [name], MD;  Location: [facility];  Service: Urology;  Laterality: Right;  . CYSTOSCOPY W/ URETERAL STENT PLACEMENT Right 06/09/2015   Procedure: CYSTOSCOPY WITH STENT REPLACEMENT;  Surgeon: [name], MD;  Location: [facility];  Service: Urology;  Laterality: Right;  . CYSTOSCOPY W/ URETERAL STENT PLACEMENT Right 09/15/2015   Procedure: CYSTOSCOPY WITH STENT REPLACEMENT;  Surgeon: [name], MD;  Location: [facility];  Service: Urology;  Laterality: Right;  . CYSTOSCOPY WITH STENT PLACEMENT Right 10/28/2014   Procedure: BLADDER BIOPSY WITH FULGERATION ;  Surgeon: [name], MD;  Location: [facility];  Service: Urology;  Laterality: Right;  . ESOPHAGEAL DILATION    . EYE SURGERY Bilateral    Cataract Extraction  . PARTIAL MASTECTOMY WITH NEEDLE LOCALIZATION Left 03/30/2015   Procedure: PARTIAL MASTECTOMY WITH NEEDLE LOCALIZATION;  Surgeon: [name], MD;  Location: [facility];  Service: General;  Laterality: Left;  . PERIPHERAL VASCULAR CATHETERIZATION N/A 02/11/2015   Procedure: IVC Filter Insertion;  Surgeon: Algernon Huxley, MD;  Location: [place] [facility];  Service: Cardiovascular;  Laterality: N/A;  . PORTACATH PLACEMENT Right 11/26/2014   Procedure: INSERTION PORT-A-CATH;  Surgeon: [name], MD;  Location: [facility];   Service: General;  Laterality: Right;  . SENTINEL NODE BIOPSY Left 03/30/2015   Procedure: SENTINEL NODE BIOPSY;  Surgeon: [name], MD;  Location: [facility];  Service: General;  Laterality: Left;  . thyroid removed    . THYROIDECTOMY, PARTIAL    . TONSILLECTOMY    . TOTAL ABDOMINAL HYSTERECTOMY W/ BILATERAL SALPINGOOPHORECTOMY Bilateral    staging biopsies    FAMILY HISTORY :   Family History  Problem Relation Age of Onset  . Stroke Mother   . Breast cancer Maternal Aunt     x 2    SOCIAL HISTORY:   Social History  Substance Use Topics  . Smoking status: Never Smoker  . Smokeless tobacco: Never Used  . Alcohol use No    ALLERGIES:  is allergic to penicillins; acetaminophen; hydrocodone-acetaminophen; novocain [procaine]; and sulfa antibiotics.  MEDICATIONS:  Current Outpatient Prescriptions  Medication Sig Dispense Refill  . fentaNYL (DURAGESIC - DOSED MCG/HR) 75 MCG/HR Place 1 patch (75 mcg total) onto the skin every 3 (three) days. 10  patch 0  . gabapentin (NEURONTIN) 300 MG capsule Take 1 capsule (300 mg total) by mouth 3 (three) times daily. 90 capsule 2  . megestrol (MEGACE) 40 MG tablet Take 1 tablet (40 mg total) by mouth 4 (four) times daily. (Patient taking differently: Take 80 mg by mouth 2 (two) times daily. ) 120 tablet 3  . Multiple Vitamins-Minerals (MULTIVITAMIN WITH MINERALS) tablet Take 1 tablet by mouth daily. Reported on 06/30/2015    . oxyCODONE-acetaminophen (PERCOCET) 10-325 MG tablet One pill three times a day as needed for pain 90 tablet 0  . VESICARE 10 MG tablet Take 1 tablet by mouth daily.    [place] apixaban (ELIQUIS) 5 MG TABS tablet Take 1 tablet (5 mg total) by mouth 2 (two) times daily. 60 tablet 5   No current facility-administered medications for this visit.     PHYSICAL EXAMINATION: ECOG PERFORMANCE STATUS: 1 - Symptomatic but completely ambulatory  BP 115/73 (BP Location: Left Arm, Patient Position: Sitting)   Pulse 68   Temp 97.5  F (36.4 C) (Tympanic)   Wt 126 lb 6 oz (57.3 kg)   BMI 23.11 kg/m   Filed Weights   03/20/16 1148 03/20/16 1151  Weight: 126 lb 6 oz (57.3 kg) 126 lb 6 oz (57.3 kg)    GENERAL: Frail-appearing Caucasian female patient walking herself Alert, no distress and comfortable. She Is accompanied by her husband.    EYES: no pallor or icterus OROPHARYNX: no thrush or ulceration; good dentition  NECK: supple, no masses felt LYMPH:  no palpable lymphadenopathy in the cervical, axillary or inguinal regions LUNGS: clear to auscultation and  No wheeze or crackles HEART/CVS: regular rate & rhythm and no murmurs; Left lower extremity swollen compared to the right.  ABDOMEN:abdomen soft, non-tender and normal bowel sounds Musculoskeletal:no cyanosis of digits and no clubbing  PSYCH: alert & oriented x 3 with fluent speech NEURO: no focal motor/sensory deficits SKIN:  no rashes or significant lesions  LABORATORY DATA:  I have reviewed the data as listed    Component Value Date/Time   NA 140 03/20/2016 1030   NA 139 11/04/2013 1257   K 3.7 03/20/2016 1030   K 4.3 03/05/2014 1156   CL 110 03/20/2016 1030   CL 106 11/04/2013 1257   CO2 26 03/20/2016 1030   CO2 26 11/04/2013 1257   GLUCOSE 100 (H) 03/20/2016 1030   GLUCOSE 96 11/04/2013 1257   BUN 20 03/20/2016 1030   BUN 13 11/04/2013 1257   CREATININE 1.09 (H) 03/20/2016 1030   CREATININE 0.90 11/04/2013 1257   CALCIUM 8.6 (L) 03/20/2016 1030   CALCIUM 8.5 11/04/2013 1257   PROT 6.5 03/20/2016 1030   PROT 6.8 11/04/2013 1257   ALBUMIN 3.7 03/20/2016 1030   ALBUMIN 3.5 11/04/2013 1257   AST 21 03/20/2016 1030   AST 25 11/04/2013 1257   ALT 15 03/20/2016 1030   ALT 20 11/04/2013 1257   ALKPHOS 29 (L) 03/20/2016 1030   ALKPHOS 64 11/04/2013 1257   BILITOT 0.9 03/20/2016 1030   BILITOT 0.5 11/04/2013 1257   GFRNONAA 45 (L) 03/20/2016 1030   GFRNONAA >60 11/04/2013 1257   GFRNONAA >60 03/29/2012 1650   GFRAA 52 (L) 03/20/2016 1030    GFRAA >60 11/04/2013 1257   GFRAA >60 03/29/2012 1650    No results found for: SPEP, UPEP  Lab Results  Component Value Date   WBC 4.8 03/20/2016   NEUTROABS 3.1 03/20/2016   HGB 14.1 03/20/2016   HCT 40.6  03/20/2016   MCV 95.2 03/20/2016   PLT 236 03/20/2016      Chemistry      Component Value Date/Time   NA 140 03/20/2016 1030   NA 139 11/04/2013 1257   K 3.7 03/20/2016 1030   K 4.3 03/05/2014 1156   CL 110 03/20/2016 1030   CL 106 11/04/2013 1257   CO2 26 03/20/2016 1030   CO2 26 11/04/2013 1257   BUN 20 03/20/2016 1030   BUN 13 11/04/2013 1257   CREATININE 1.09 (H) 03/20/2016 1030   CREATININE 0.90 11/04/2013 1257      Component Value Date/Time   CALCIUM 8.6 (L) 03/20/2016 1030   CALCIUM 8.5 11/04/2013 1257   ALKPHOS 29 (L) 03/20/2016 1030   ALKPHOS 64 11/04/2013 1257   AST 21 03/20/2016 1030   AST 25 11/04/2013 1257   ALT 15 03/20/2016 1030   ALT 20 11/04/2013 1257   BILITOT 0.9 03/20/2016 1030   BILITOT 0.5 11/04/2013 1257       RADIOGRAPHIC STUDIES: I have personally reviewed the radiological images as listed and agreed with the findings in the report. No results found.   ASSESSMENT & PLAN:  Endometrial cancer ([place]) Metastatic endometrial cancer to the bone- currently on Megace. Tolerating it well. PET- NOV 2017- Stable right pelvic mets/ right paratrach LN/ stable; but clinically complains of increasing pain. Currently s/p palliative RT to right hip- improved.   #  Continue Megace for now.no clinical progression noted.   # Pain - improved. on fentanyl patch to 75/ and percocet to 10 every 8 hours. New script for fentanyl given.   # Swelling of the left lower extremity;s/p IVC filter.  Aug 2017- Dopplers of the lower extremity DVT extensive; Eliquis  5 mg BID improved.  Discussed re: prophylaxis for DVT during travel.   # Metastatic lesions to the bone on X-geva- every 4 weeks. Tolerating well.  Ca 8.6; ca+ vit D.   # Port flushed every 8  weeks.   # labs/x-geva//MD. Plan scan in April 2018. Will get scans in April 2018; [place].   No orders of the defined types were placed in this encounter.     Cammie Sickle, MD 03/21/2016 1:49 PM

## 2016-03-20 NOTE — Assessment & Plan Note (Addendum)
Metastatic endometrial cancer to the bone- currently on Megace. Tolerating it well. PET- NOV 2017- Stable right pelvic mets/ right paratrach LN/ stable; but clinically complains of increasing pain. Currently s/p palliative RT to right hip- improved.   #  Continue Megace for now.no clinical progression noted.   # Pain - improved. on fentanyl patch to 75/ and percocet to 10 every 8 hours. New script for fentanyl given.   # Swelling of the left lower extremity;s/p IVC filter.  Aug 2017- Dopplers of the lower extremity DVT extensive; Eliquis  5 mg BID improved.  Discussed re: prophylaxis for DVT during travel.   # Metastatic lesions to the bone on X-geva- every 4 weeks. Tolerating well.  Ca 8.6; ca+ vit D.   # Port flushed every 8 weeks.   # labs/x-geva//MD. Plan scan in April 2018. Will get scans in April 2018; [place] go to beach [place] march [place].

## 2016-03-29 ENCOUNTER — Inpatient Hospital Stay (HOSPITAL_BASED_OUTPATIENT_CLINIC_OR_DEPARTMENT_OTHER): Payer: Medicare Other | Admitting: Obstetrics and Gynecology

## 2016-03-29 VITALS — BP 112/63 | HR 65 | Temp 97.8°F | Resp 18 | Ht 62.0 in | Wt 127.6 lb

## 2016-03-29 DIAGNOSIS — M1990 Unspecified osteoarthritis, unspecified site: Secondary | ICD-10-CM

## 2016-03-29 DIAGNOSIS — C7951 Secondary malignant neoplasm of bone: Secondary | ICD-10-CM

## 2016-03-29 DIAGNOSIS — Z86718 Personal history of other venous thrombosis and embolism: Secondary | ICD-10-CM

## 2016-03-29 DIAGNOSIS — Z90710 Acquired absence of both cervix and uterus: Secondary | ICD-10-CM

## 2016-03-29 DIAGNOSIS — Z7901 Long term (current) use of anticoagulants: Secondary | ICD-10-CM

## 2016-03-29 DIAGNOSIS — C541 Malignant neoplasm of endometrium: Secondary | ICD-10-CM | POA: Diagnosis not present

## 2016-03-29 DIAGNOSIS — K219 Gastro-esophageal reflux disease without esophagitis: Secondary | ICD-10-CM | POA: Diagnosis not present

## 2016-03-29 DIAGNOSIS — N189 Chronic kidney disease, unspecified: Secondary | ICD-10-CM | POA: Diagnosis not present

## 2016-03-29 DIAGNOSIS — Z923 Personal history of irradiation: Secondary | ICD-10-CM

## 2016-03-29 DIAGNOSIS — Z79818 Long term (current) use of other agents affecting estrogen receptors and estrogen levels: Secondary | ICD-10-CM | POA: Diagnosis not present

## 2016-03-29 DIAGNOSIS — Z9221 Personal history of antineoplastic chemotherapy: Secondary | ICD-10-CM | POA: Diagnosis not present

## 2016-03-29 DIAGNOSIS — Z79891 Long term (current) use of opiate analgesic: Secondary | ICD-10-CM

## 2016-03-29 DIAGNOSIS — C7911 Secondary malignant neoplasm of bladder: Secondary | ICD-10-CM

## 2016-03-29 DIAGNOSIS — Z8673 Personal history of transient ischemic attack (TIA), and cerebral infarction without residual deficits: Secondary | ICD-10-CM

## 2016-03-29 DIAGNOSIS — Z90722 Acquired absence of ovaries, bilateral: Secondary | ICD-10-CM

## 2016-03-29 NOTE — Progress Notes (Signed)
  Oncology Nurse Navigator Documentation Chaperoned pelvic exam. She will follow up in 6 months. Navigator Location: [facility] (03/29/16 1100)   )Navigator Encounter Type: Follow-up Appt (03/29/16 1100)                                                    Time Spent with Patient: 15 (03/29/16 1100)

## 2016-03-29 NOTE — Progress Notes (Signed)
Right hip pain at times mostly at night but not right now-that is the pain where her cancer is.

## 2016-03-29 NOTE — Progress Notes (Signed)
Gynecologic Oncology Visit   Referring Provider:  Dr. Donneta Romberg  Chief Concern: recurrent endometroid endometrial adenocarcinoma, stage Ib, grade 1.  Subjective:  Traci [name] is a 83 y.o. female who is seen in consultation from Dr. Doylene Canning for recurrent endometroid endometrial adenocarcinoma, stage Ib, grade 1. She is currently seeing Dr. Donneta Romberg for her care and continues taking Megace 40 mg qid for recurrent endometrial cancer and has stable disease by imaging. She returns today for follow up. Her symptoms are stable.   01/03/2016 PET/CT result below.   FINDINGS: NECK No hypermetabolic lymph nodes in the neck. CT images show no acute findings.  CHEST Left thyroid is mildly hypermetabolic, as before, nonspecific. Low right paratracheal lymph node measures 7 mm (CT image 80) with an SUV max of 4.7, stable from 09/28/2015. No additional hypermetabolic mediastinal, hilar or axillary lymph nodes. Right IJ Port-A-Cath terminates at the SVC RA junction. No pericardial or pleural effusion.  ABDOMEN/PELVIS No abnormal hypermetabolism in the liver, adrenal glands, spleen or pancreas. No hypermetabolic lymph nodes. Liver, gallbladder, adrenal glands unremarkable. Right kidney is atrophic. Probable renal sinus cysts on the left. Spleen, pancreas, stomach and bowel are grossly unremarkable. IVC filter is in place. Small pelvic free fluid.  SKELETON An expansile lytic lesion in the right iliac wing is unchanged, measuring 3.5 x 3.7 cm and with an SUV max of 3.1, as before.  IMPRESSION: 1. Hypermetabolic low right paratracheal node and right iliac wing metastasis stable. 2. Small pelvic free fluid.   Oncology History She was diagnosed with endometrial cancer in 12/13 and had recurrence in 2016.  01/2012  endometroid endometrial adenocarcinoma, stage Ib, grade 1, positive peritoneal cytology, no other risk factors or extra-uterine disease, TAH/BSO and staging, lymphocyst post-op,  vaginal brachytherapy  In 2016 patient had abnormal mammogram of the left breast and biopsy was positive for invasive carcinoma ER+/PR+ treated with lumpectomy. Staging work up showed right hydronephrosis and right pelvic mass invading bone and muscle.  See below.  10/28/14 Cystoscopy done for stent placement and showed tumor invading bladder.  BIOPSY: WELL-DIFFERENTIATED ADENOCARCINOMA MORPHOLOGICALLY SIMILAR TO PREVIOUS ENDOMETRIAL ADENOCARCINOMA.  The stent could not be placed so 11/13/14 Right PCN placed in radiology.  CT scan 11/09/14 Bilateral pelvic lymphadenopathy, right side greater than left, consistent with metastatic disease. Small soft tissue nodule involving the left vaginal cuff and 1.5 cm soft tissue nodule or lymphadenopathy in the sigmoid mesocolon, consistent with carcinoma. Right iliac bone metastasis with associated iliopsoas soft tissue mass. Severe right hydronephrosis and diffuse right renal parenchymal atrophy secondary to pelvic metastatic disease. No metastatic disease identified within the abdomen or chest.  She underwent external pelvic radiation and completed in 11/16 with relief in the pain,  Then started on Megace 40 mg qid in December of 2016 and developed deep vein thrombosis in the left lower extremity (February 09, 2015). Hematuria due toxeralto IVC filter placement on February 11, 2015 and xeralto stopped. Cystoscopy no evidence of bleeding, so patient has been started on ELOQUIS (March 15, 2015).  Patient again had a significant bleeding with hematuria with eloquis and was discontinued and Plavix started. June 2017- PET IMPROVED. August 25 2015- LLE DVT- start Eliquis [stop plavix];   PET/CT 09/28/15 CHEST A new 9 mm hypermetabolic mediastinal lymph node is seen in the precarinal region. This measures 9 mm on image 75/3, with SUV max of 4.5. No other hypermetabolic lymph nodes identified within the thorax. No suspicious pulmonary nodules seen on CT images.  Mild emphysema and  bilateral upper lobe scarring again noted.  ABDOMEN/PELVIS No abnormal hypermetabolic activity within the liver, pancreas, adrenal glands, or spleen. No hypermetabolic lymph nodes or other nodules seen within in abdomen or pelvis.  Previously seen soft tissue prominence along the left pelvic sidewall obturator space, and soft tissue nodularity along the left vaginal cuff are no longer visualized. No hypermetabolic peritoneal nodules identified. No evidence of ascites.  Right ureteral stent remains in appropriate position. No evidence of hydronephrosis. Diffuse right renal parenchymal atrophy again noted. IVC filter remains in place. Sigmoid diverticulosis again demonstrated, without evidence of diverticulitis. Prior hysterectomy.  SKELETON Decreased size of lytic bone lesion and associated soft tissue mass involving the right ilium. This currently measures 2.9 x 4.1 cm on image 184/3 compared to 3.4 x 4.4 cm previously. This shows low-grade metabolic activity with SUV max of 3.5. No other suspicious bone lesions identified. Lumbar spine fusion hardware again noted.  IMPRESSION: New 9 mm hypermetabolic precarinal mediastinal lymph node. Differential diagnosis includes metastatic disease and reactive/ inflammatory etiology. Recommend continued followup by chest CT with contrast in 3-6 months.  Decreased size of lytic bone metastasis involving the right ilium.  No other sites of metabolically active metastatic disease identified   MOLECULAR STUDIES: 08/23/15- MMR-ordered.  MICROSATELLITE INSTABILITY IMMUNOHISTOCHEMISTRY  MISMATCH REPAIR PROTEINS:   MLH1: Intact nuclear expression  MSH2: Intact nuclear expression  MSH6: Intact nuclear expression  PMS2: Intact nuclear expression   Interpretation: No loss of nuclear expression of mismatch repair  proteins: Low probability of MSI-H.   Health maintenance: Her breast cancer screening is done by her  PCP, Dr. Doy Hutching, and she is scheduled for a mammogram today.   Problem List: Patient Active Problem List   Diagnosis Date Noted  . Pain and swelling of left lower leg 08/25/2015  . Seizure disorder ([place]) 04/06/2015  . Personal history of urinary infection 03/15/2015  . Cerebrovascular accident (CVA) ([place]) 02/18/2015  . Endometrial cancer ([place]) 11/18/2014  . Degeneration of intervertebral disc of lumbosacral region 11/14/2014  . Overflow incontinence 11/14/2014  . Pure hypercholesterolemia 11/14/2014  . Acquired hypothyroidism 10/20/2014  . DDD (degenerative disc disease), lumbosacral 10/20/2014  . Benign neoplasm of colon 10/20/2014  . Acid reflux 10/20/2014  . Esophageal stenosis 10/20/2014  . Bloodgood disease 10/20/2014  . History of colon polyps 10/20/2014  . H/O gastric ulcer 10/20/2014  . Healed or old pulmonary embolism 10/20/2014  . History of urinary anomaly 10/20/2014  . BP (high blood pressure) 10/20/2014  . Hypersomnia with sleep apnea 10/20/2014  . Multinodular goiter 10/20/2014  . Angina pectoris ([place]) 10/20/2014  . Seizure ([place]) 10/20/2014  . Digestive symptom 10/20/2014  . Incontinence overflow, urine 10/20/2014  . Dupuytren's contracture of foot 10/20/2014  . Hypercholesterolemia without hypertriglyceridemia 10/20/2014  . Cancer of left breast ([place]) 10/20/2014  . History of endometrial cancer 09/30/2014  . Lumbar radiculopathy 09/04/2014  . Deep vein thrombosis ([place]) 07/15/2013  . Deep vein thrombosis (DVT) ([place]) 07/15/2013  . Stroke [facility]) 04/07/2011    Past Medical History: Past Medical History:  Diagnosis Date  . Arthritis    Degenerative Disc Disease  . Bone cancer ([place])   . Breast cancer ([place]) 2016   LT LUMPECTOMY 03-2015  . Chronic kidney disease    HYDRONEPHROSIS  . Deep vein blood clot of left lower extremity ([place]) February 11, 2015   Left Leg  . Endometrial adenocarcinoma ([place]) 01/2012    stage Ib, grade 1, positive peritoneal cytology, no  other risk factors or extra-uterine  disease  . Gastric ulcer   . GERD (gastroesophageal reflux disease)   . Hearing loss   . Hiccups   . History of brachytherapy   . History of colon polyps   . History of DVT (deep vein thrombosis)   . History of esophageal stricture   . History of shingles   . Inguinal lymphocyst   . Mild aphasia   . Pain    SEVERE CHRONIC RIGHT LEG,HIP,FOOT  . PE (pulmonary embolism)    H/O  . Stroke [facility]) 04/06/2011   residual:  aphasia    Past Surgical History: Past Surgical History:  Procedure Laterality Date  . ABDOMINAL HYSTERECTOMY  December 2013   history of cervical cancer  . BACK SURGERY     fusion of L3&4  . BREAST BIOPSY Left 10/13/2014   POS  . CATARACT EXTRACTION W/ INTRAOCULAR LENS IMPLANT     right  . CYSTOSCOPY W/ RETROGRADES Right 10/28/2014   Procedure: CYSTOSCOPY WITH RETROGRADE PYELOGRAM ATTEMPT, UNABLE TO PASS ;  Surgeon: [name], MD;  Location: [facility];  Service: Urology;  Laterality: Right;  . CYSTOSCOPY W/ RETROGRADES Right 09/15/2015   Procedure: CYSTOSCOPY WITH RETROGRADE PYELOGRAM;  Surgeon: [name], MD;  Location: [facility];  Service: Urology;  Laterality: Right;  . CYSTOSCOPY W/ URETERAL STENT PLACEMENT Right 03/10/2015   Procedure: CYSTOSCOPY WITH STENT REPLACEMENT;  Surgeon: [name], MD;  Location: [facility];  Service: Urology;  Laterality: Right;  . CYSTOSCOPY W/ URETERAL STENT PLACEMENT Right 06/09/2015   Procedure: CYSTOSCOPY WITH STENT REPLACEMENT;  Surgeon: [name], MD;  Location: [facility];  Service: Urology;  Laterality: Right;  . CYSTOSCOPY W/ URETERAL STENT PLACEMENT Right 09/15/2015   Procedure: CYSTOSCOPY WITH STENT REPLACEMENT;  Surgeon: [name], MD;  Location: [facility];  Service: Urology;  Laterality: Right;  . CYSTOSCOPY WITH STENT PLACEMENT Right 10/28/2014   Procedure: BLADDER BIOPSY WITH FULGERATION ;  Surgeon: [name], MD;  Location: [facility];  Service:  Urology;  Laterality: Right;  . ESOPHAGEAL DILATION    . EYE SURGERY Bilateral    Cataract Extraction  . PARTIAL MASTECTOMY WITH NEEDLE LOCALIZATION Left 03/30/2015   Procedure: PARTIAL MASTECTOMY WITH NEEDLE LOCALIZATION;  Surgeon: [name], MD;  Location: [facility];  Service: General;  Laterality: Left;  . PERIPHERAL VASCULAR CATHETERIZATION N/A 02/11/2015   Procedure: IVC Filter Insertion;  Surgeon: Algernon Huxley, MD;  Location: [facility];  Service: Cardiovascular;  Laterality: N/A;  . PORTACATH PLACEMENT Right 11/26/2014   Procedure: INSERTION PORT-A-CATH;  Surgeon: [name], MD;  Location: [facility];  Service: General;  Laterality: Right;  . SENTINEL NODE BIOPSY Left 03/30/2015   Procedure: SENTINEL NODE BIOPSY;  Surgeon: [name], MD;  Location: [facility];  Service: General;  Laterality: Left;  . thyroid removed    . THYROIDECTOMY, PARTIAL    . TONSILLECTOMY    . TOTAL ABDOMINAL HYSTERECTOMY W/ BILATERAL SALPINGOOPHORECTOMY Bilateral    staging biopsies    Past Gynecologic History:  See HPI  OB History:  OB History  Gravida Para Term Preterm AB Living  2            SAB TAB Ectopic Multiple Live Births               # Outcome Date GA Lbr Len/2nd Weight Sex Delivery Anes PTL Lv  2 [place]           1 [place]  Family History: Family History  Problem Relation Age of Onset  . Heart attack Mother   . Breast cancer Maternal Aunt     x 2    Social History: Social History   Social History  . Marital status: Married    Spouse name: N/A  . Number of children: N/A  . Years of education: N/A   Occupational History  . Not on file.   Social History Main Topics  . Smoking status: Never Smoker  . Smokeless tobacco: Never Used  . Alcohol use No  . Drug use: No  . Sexual activity: Yes    Partners: Male   Other Topics Concern  . Not on file   Social History Narrative  . No narrative on file     Allergies: Allergies  Allergen Reactions  . Penicillins Rash  . Acetaminophen     Other reaction(s): Unknown  . Hydrocodone-Acetaminophen Nausea Only  . Novocain [Procaine] Other (See Comments) and Nausea And Vomiting    Other reaction(s): Unknown Chest pain  . Sulfa Antibiotics Other (See Comments)    CHEST PAIN    Current Medications: Current Outpatient Prescriptions  Medication Sig Dispense Refill  . apixaban (ELIQUIS) 5 MG TABS tablet Take 1 tablet (5 mg total) by mouth 2 (two) times daily. 60 tablet 5  . fentaNYL (DURAGESIC - DOSED MCG/HR) 75 MCG/HR Place 1 patch (75 mcg total) onto the skin every 3 (three) days. 10 patch 0  . gabapentin (NEURONTIN) 300 MG capsule Take 1 capsule (300 mg total) by mouth 3 (three) times daily. 90 capsule 2  . megestrol (MEGACE) 40 MG tablet Take 1 tablet (40 mg total) by mouth 4 (four) times daily. (Patient taking differently: Take 80 mg by mouth 2 (two) times daily. ) 120 tablet 3  . Multiple Vitamins-Minerals (MULTIVITAMIN WITH MINERALS) tablet Take 1 tablet by mouth daily. Reported on 06/30/2015    . oxyCODONE-acetaminophen (PERCOCET) 10-325 MG tablet One pill three times a day as needed for pain 90 tablet 0  . VESICARE 10 MG tablet Take 1 tablet by mouth daily.     No current facility-administered medications for this visit.     Review of Systems General: fatigue  HEENT: no complaints  Lungs: no complaints  Cardiac: no complaints  GI: constipation  GU: no complaints  Musculoskeletal: back and right hip pain  Extremities: right leg pain  Skin: no complaints  Neuro: neuropathic pain in her RLE due to back issues in the past - no complaints today  Endocrine: no complaints  Psych: occasionally feeling sad      Objective:  Physical Examination:  BP 112/63   Pulse 65   Temp 97.8 F (36.6 C) (Tympanic)   Resp 18   Ht '5\' 2"'$  (1.575 m)   Wt 127 lb 10.3 oz (57.9 kg)   BMI 23.35 kg/m    ECOG Performance Status: 1 - Symptomatic  but completely ambulatory  General appearance: alert and appears stated age HEENT:PERRLA, extra ocular movement intact and sclera clear, anicteric Lymph node survey: non-palpable inguinal nodes Abdomen: soft, non-tender, without masses or organomegaly, no hernias and well healed incision Extremities: extremities normal, atraumatic, no cyanosis or edema Neurological exam reveals alert, oriented, normal speech, no focal findings.  Pelvic: exam chaperoned by nurse;  Vulva: normal appearing vulva with no masses, tenderness or lesions; Vagina: agglutinated and length is only 4 cm, no tumor seen or palpated; Adnexa: surgically absent; Uterus and Cervix: surgically absent, vaginal cuff not visualized due to  agglutination;  No masses.  Rectal: confirmatory, a large amount of stool which limits exam.    Assessment:  Traci [name] is a 83 y.o. female diagnosed with pelvic recurrence of stage IB grade 1 endometroid endometrial adenocarcinoma s/p surgery and brachytherapy 2013. She had a right pelvic side wall mass invading bone/bladder and hydronephrosis, and got a ureteral stent and external pelvic radiation in 11/16.  Now on adjuvant Megace and has residual disease in pelvis, with partial response on PET/CT.  Has had issues with VTE and bleeding managed by Dr Tish Men. IVC filter remains in place.  She has an atrophic right kidney and has right ureteral stent managed by Urology.  Plan:   Problem List Items Addressed This Visit      Genitourinary   Endometrial cancer ([place]) - Primary      Continue Megace therapy per Dr Rogue Bussing.  Her recurrent grade I endometrial cancer is still persistent in the right pelvis and involves bone, but she has minimal symptoms and partial response to Megace with stable disease on follow up imaging.    She will RTC to see [place] in 6 months and will follow up with Dr Rogue Bussing as scheduled.  Gillis Ends, MD  CC: Dr. Ouida Sills  Dr. Rogue Bussing

## 2016-03-30 ENCOUNTER — Other Ambulatory Visit: Payer: Self-pay | Admitting: *Deleted

## 2016-03-30 DIAGNOSIS — C541 Malignant neoplasm of endometrium: Secondary | ICD-10-CM

## 2016-03-30 MED ORDER — OXYCODONE-ACETAMINOPHEN 10-325 MG PO TABS: ORAL_TABLET | ORAL | 0 refills | Status: DC

## 2016-04-17 ENCOUNTER — Inpatient Hospital Stay: Payer: Medicare Other

## 2016-04-17 ENCOUNTER — Inpatient Hospital Stay: Payer: Medicare Other | Attending: Internal Medicine | Admitting: Internal Medicine

## 2016-04-17 VITALS — BP 130/68 | HR 58 | Temp 97.2°F | Resp 18 | Wt 125.0 lb

## 2016-04-17 DIAGNOSIS — Z170 Estrogen receptor positive status [ER+]: Secondary | ICD-10-CM | POA: Diagnosis not present

## 2016-04-17 DIAGNOSIS — Z86718 Personal history of other venous thrombosis and embolism: Secondary | ICD-10-CM | POA: Diagnosis not present

## 2016-04-17 DIAGNOSIS — Z79899 Other long term (current) drug therapy: Secondary | ICD-10-CM | POA: Diagnosis not present

## 2016-04-17 DIAGNOSIS — G8929 Other chronic pain: Secondary | ICD-10-CM | POA: Insufficient documentation

## 2016-04-17 DIAGNOSIS — F0390 Unspecified dementia without behavioral disturbance: Secondary | ICD-10-CM | POA: Insufficient documentation

## 2016-04-17 DIAGNOSIS — Z923 Personal history of irradiation: Secondary | ICD-10-CM

## 2016-04-17 DIAGNOSIS — N1330 Unspecified hydronephrosis: Secondary | ICD-10-CM | POA: Diagnosis not present

## 2016-04-17 DIAGNOSIS — Z90710 Acquired absence of both cervix and uterus: Secondary | ICD-10-CM

## 2016-04-17 DIAGNOSIS — C7911 Secondary malignant neoplasm of bladder: Secondary | ICD-10-CM

## 2016-04-17 DIAGNOSIS — Z8541 Personal history of malignant neoplasm of cervix uteri: Secondary | ICD-10-CM | POA: Insufficient documentation

## 2016-04-17 DIAGNOSIS — Z803 Family history of malignant neoplasm of breast: Secondary | ICD-10-CM

## 2016-04-17 DIAGNOSIS — Z86711 Personal history of pulmonary embolism: Secondary | ICD-10-CM | POA: Insufficient documentation

## 2016-04-17 DIAGNOSIS — M25551 Pain in right hip: Secondary | ICD-10-CM | POA: Diagnosis not present

## 2016-04-17 DIAGNOSIS — M189 Osteoarthritis of first carpometacarpal joint, unspecified: Secondary | ICD-10-CM

## 2016-04-17 DIAGNOSIS — Z9221 Personal history of antineoplastic chemotherapy: Secondary | ICD-10-CM | POA: Insufficient documentation

## 2016-04-17 DIAGNOSIS — N189 Chronic kidney disease, unspecified: Secondary | ICD-10-CM | POA: Diagnosis not present

## 2016-04-17 DIAGNOSIS — Z7902 Long term (current) use of antithrombotics/antiplatelets: Secondary | ICD-10-CM | POA: Diagnosis not present

## 2016-04-17 DIAGNOSIS — Z86010 Personal history of colonic polyps: Secondary | ICD-10-CM | POA: Insufficient documentation

## 2016-04-17 DIAGNOSIS — Z8673 Personal history of transient ischemic attack (TIA), and cerebral infarction without residual deficits: Secondary | ICD-10-CM | POA: Diagnosis not present

## 2016-04-17 DIAGNOSIS — Z880 Allergy status to penicillin: Secondary | ICD-10-CM | POA: Insufficient documentation

## 2016-04-17 DIAGNOSIS — Z8619 Personal history of other infectious and parasitic diseases: Secondary | ICD-10-CM | POA: Insufficient documentation

## 2016-04-17 DIAGNOSIS — R600 Localized edema: Secondary | ICD-10-CM | POA: Insufficient documentation

## 2016-04-17 DIAGNOSIS — C541 Malignant neoplasm of endometrium: Secondary | ICD-10-CM

## 2016-04-17 DIAGNOSIS — K219 Gastro-esophageal reflux disease without esophagitis: Secondary | ICD-10-CM | POA: Diagnosis not present

## 2016-04-17 DIAGNOSIS — Z90722 Acquired absence of ovaries, bilateral: Secondary | ICD-10-CM | POA: Insufficient documentation

## 2016-04-17 DIAGNOSIS — C7951 Secondary malignant neoplasm of bone: Secondary | ICD-10-CM | POA: Insufficient documentation

## 2016-04-17 DIAGNOSIS — C50812 Malignant neoplasm of overlapping sites of left female breast: Secondary | ICD-10-CM | POA: Diagnosis not present

## 2016-04-17 LAB — COMPREHENSIVE METABOLIC PANEL
ALBUMIN: 3.7 g/dL (ref 3.5–5.0)
ALK PHOS: 31 U/L — AB (ref 38–126)
ALT: 13 U/L — ABNORMAL LOW (ref 14–54)
ANION GAP: 7 (ref 5–15)
AST: 19 U/L (ref 15–41)
BILIRUBIN TOTAL: 0.5 mg/dL (ref 0.3–1.2)
BUN: 26 mg/dL — ABNORMAL HIGH (ref 6–20)
CALCIUM: 8.9 mg/dL (ref 8.9–10.3)
CO2: 26 mmol/L (ref 22–32)
CREATININE: 1.09 mg/dL — AB (ref 0.44–1.00)
Chloride: 107 mmol/L (ref 101–111)
GFR calc Af Amer: 52 mL/min — ABNORMAL LOW (ref >60)
GFR calc non Af Amer: 45 mL/min — ABNORMAL LOW (ref >60)
GLUCOSE: 99 mg/dL (ref 65–99)
Potassium: 3.7 mmol/L (ref 3.5–5.1)
Sodium: 140 mmol/L (ref 135–145)
TOTAL PROTEIN: 6.7 g/dL (ref 6.5–8.1)

## 2016-04-17 LAB — CBC WITH DIFFERENTIAL/PLATELET
BASOS PCT: 1 %
Basophils Absolute: 0 10*3/uL (ref 0–0.1)
Eosinophils Absolute: 0.2 10*3/uL (ref 0–0.7)
Eosinophils Relative: 6 %
HEMATOCRIT: 41.1 % (ref 35.0–47.0)
HEMOGLOBIN: 14.2 g/dL (ref 12.0–16.0)
LYMPHS ABS: 1 10*3/uL (ref 1.0–3.6)
Lymphocytes Relative: 23 %
MCH: 32.9 pg (ref 26.0–34.0)
MCHC: 34.6 g/dL (ref 32.0–36.0)
MCV: 95.1 fL (ref 80.0–100.0)
Monocytes Absolute: 0.5 10*3/uL (ref 0.2–0.9)
Monocytes Relative: 11 %
NEUTROS ABS: 2.6 10*3/uL (ref 1.4–6.5)
NEUTROS PCT: 59 %
Platelets: 228 10*3/uL (ref 150–440)
RBC: 4.32 MIL/uL (ref 3.80–5.20)
RDW: 13.2 % (ref 11.5–14.5)
WBC: 4.3 10*3/uL (ref 3.6–11.0)

## 2016-04-17 MED ORDER — DENOSUMAB 120 MG/1.7ML ~~LOC~~ SOLN
120.0000 mg | Freq: Once | SUBCUTANEOUS | Status: AC
  Administered 2016-04-17: 120 mg via SUBCUTANEOUS
  Filled 2016-04-17: qty 1.7

## 2016-04-17 MED ORDER — FENTANYL 75 MCG/HR TD PT72: 75.0000 ug | MEDICATED_PATCH | TRANSDERMAL | 0 refills | Status: DC

## 2016-04-17 NOTE — Progress Notes (Signed)
Patient here today for follow up.  Patient is c/o right hip pain and says she felt like she was going to pass out this morning prior to eating breakfast

## 2016-04-17 NOTE — Assessment & Plan Note (Addendum)
Metastatic endometrial cancer to the bone- currently on Megace. Tolerating it well. PET- NOV 2017- Stable right pelvic mets/ right paratrach LN/ stable; but clinically complains of increasing pain. Currently s/p palliative RT to right hip- improved.  Continue Megace for now.no clinical progression noted.   # Pain - improved. on fentanyl patch to 75/ and percocet to 10 every 8-12 hours. New script for fentanyl given.   # Swelling of the left lower extremity;s/p IVC filter.  Aug 2017- Dopplers of the lower extremity DVT extensive; Eliquis  5 mg BID improved.    # ? Dementia- on aricept/ prevagen [non-estrogen] as per PCP.   # Metastatic lesions to the bone on X-geva- every 4 weeks. Tolerating well.  Ca 8.9; ca+ vit D.   # Port flushed every 8 weeks.   # labs/x-geva//MD; PET prior.

## 2016-04-17 NOTE — Progress Notes (Signed)
[facility] OFFICE PROGRESS NOTE  Patient Care Team: Idelle Crouch, MD as PCP - General (Unknown Physician Specialty) [name], MD as Referring Physician (Surgery) [name], MD as Consulting Physician (Urology)  Cancer of left breast [facility])   Staging form: Breast, AJCC 7th Edition     Clinical: Stage IA (T1b, N0, M0) - Signed by Forest Gleason, MD on 10/20/2014    Oncology History   . Patient has a previous history of endometrium carcinoma of endometrium stage IB disease status post bilateral salpingo-oophorectomy and radiation therapy 4. Right hydronephrosis detected on MRI scan off lumbosacral spine. Cystoscopy with attempted stent placement revealed bladder tumor in September of 2016 biopsies positive for recurrent endometrial cancer  5. Patient had stent placement in the right kidney Started radiation therapy 6. Patient has finished radiation therapy with relief in the pain (November, 2016) 7. Started on Megace in December of 2016 8 deep vein thrombosis in the left lower extremity (February 08, 2014) Hematuria due to xeralto IVC filter placement on February 11, 2015 She is off xeralto 9.condition recently had a cystoscopy no evidence of bleeding was found so patient has been started on ELOQUIS (March 15, 2015)  10. Patient again had a significant bleeding with hematuria with eloquis and has been discontinued; Patient is on Plavix. ON  Megace. AUG 2017- PET IMPROVED; mediastinal uptake- monitor for now.   # August 25 2015- LLE DVT- start Eliquis [stop plavix]; PET  FEB 2017-LEFT BREAST CA [9'0 clock- overlapping site] Estrogen receptor positive. Progesterone receptor positive. HER-2 receptor; Her 2neg; 5 mm tumor clinically stage IB N0 M0 tumor;s/p Lumpectomy; NO RT  # PET NOV 27th- STABLE right iliac lesions [increasing pain]/ right paratrac- refer to RT  # X-geva q 4 w  # MOLECULAR STUDIES: 08/23/15- MMR-STABLE.      Cancer  of left breast ([place])   10/20/2014 Initial Diagnosis    Cancer of left breast       Endometrial cancer ([place])   11/18/2014 Initial Diagnosis    Endometrial cancer ([place])        INTERVAL HISTORY:  Traci [name] 83 y.o.  female pleasant patient above history of Metastatic endometrial cancer currently on Megace- Is here for follow-up/ Accompanied by her husband.  In the interim patient was evaluated by GYN oncology- no new recommendations at this time. In the interim patient was also diagnosed with dementia by her PCP started on Aricept; and prevagen.   Patient continues to have right hip pain; currently stable on fentanyl patch. She is also needing to take breakthrough pain medication up to 2 times a day. No bleeding.  Patient denies any abdominal pain. Denies any new onset of bone pain. Appetite is good. Denies any jaw pain. No chest pain shortness of the cough. She is fairly active otherwise.   REVIEW OF SYSTEMS:  A complete 10 point review of system is done which is negative except mentioned above/history of present illness.   PAST MEDICAL HISTORY :  Past Medical History:  Diagnosis Date  . Arthritis    Degenerative Disc Disease  . Bone cancer ([place])   . Breast cancer ([place]) 2016   LT LUMPECTOMY 03-2015  . Chronic kidney disease    HYDRONEPHROSIS  . Deep vein blood clot of left lower extremity ([place]) February 11, 2015   Left Leg  . Endometrial adenocarcinoma ([place]) 01/2012    stage Ib, grade 1, positive peritoneal cytology, no other risk factors or extra-uterine  disease  . Gastric ulcer   . GERD (gastroesophageal reflux disease)   . Hearing loss   . Hiccups   . History of brachytherapy   . History of colon polyps   . History of DVT (deep vein thrombosis)   . History of esophageal stricture   . History of shingles   . Inguinal lymphocyst   . Mild aphasia   . Pain    SEVERE CHRONIC RIGHT LEG,HIP,FOOT  . PE (pulmonary embolism)    H/O  . Stroke ([place]) 04/06/2011   residual:   aphasia    PAST SURGICAL HISTORY :   Past Surgical History:  Procedure Laterality Date  . ABDOMINAL HYSTERECTOMY  December 2013   history of cervical cancer  . BACK SURGERY     fusion of L3&4  . BREAST BIOPSY Left 10/13/2014   POS  . CATARACT EXTRACTION W/ INTRAOCULAR LENS IMPLANT     right  . CYSTOSCOPY W/ RETROGRADES Right 10/28/2014   Procedure: CYSTOSCOPY WITH RETROGRADE PYELOGRAM ATTEMPT, UNABLE TO PASS ;  Surgeon: [name], MD;  Location: [facility];  Service: Urology;  Laterality: Right;  . CYSTOSCOPY W/ RETROGRADES Right 09/15/2015   Procedure: CYSTOSCOPY WITH RETROGRADE PYELOGRAM;  Surgeon: [name], MD;  Location: [facility];  Service: Urology;  Laterality: Right;  . CYSTOSCOPY W/ URETERAL STENT PLACEMENT Right 03/10/2015   Procedure: CYSTOSCOPY WITH STENT REPLACEMENT;  Surgeon: [name], MD;  Location: [facility];  Service: Urology;  Laterality: Right;  . CYSTOSCOPY W/ URETERAL STENT PLACEMENT Right 06/09/2015   Procedure: CYSTOSCOPY WITH STENT REPLACEMENT;  Surgeon: [name], MD;  Location: [facility];  Service: Urology;  Laterality: Right;  . CYSTOSCOPY W/ URETERAL STENT PLACEMENT Right 09/15/2015   Procedure: CYSTOSCOPY WITH STENT REPLACEMENT;  Surgeon: [name], MD;  Location: [facility];  Service: Urology;  Laterality: Right;  . CYSTOSCOPY WITH STENT PLACEMENT Right 10/28/2014   Procedure: BLADDER BIOPSY WITH FULGERATION ;  Surgeon: [name], MD;  Location: [facility];  Service: Urology;  Laterality: Right;  . ESOPHAGEAL DILATION    . EYE SURGERY Bilateral    Cataract Extraction  . PARTIAL MASTECTOMY WITH NEEDLE LOCALIZATION Left 03/30/2015   Procedure: PARTIAL MASTECTOMY WITH NEEDLE LOCALIZATION;  Surgeon: [name], MD;  Location: [facility];  Service: General;  Laterality: Left;  . PERIPHERAL VASCULAR CATHETERIZATION N/A 02/11/2015   Procedure: IVC Filter Insertion;  Surgeon: Algernon Huxley, MD;  Location: [facility];   Service: Cardiovascular;  Laterality: N/A;  . PORTACATH PLACEMENT Right 11/26/2014   Procedure: INSERTION PORT-A-CATH;  Surgeon: [name], MD;  Location: [facility];  Service: General;  Laterality: Right;  . SENTINEL NODE BIOPSY Left 03/30/2015   Procedure: SENTINEL NODE BIOPSY;  Surgeon: [name], MD;  Location: [facility];  Service: General;  Laterality: Left;  . thyroid removed    . THYROIDECTOMY, PARTIAL    . TONSILLECTOMY    . TOTAL ABDOMINAL HYSTERECTOMY W/ BILATERAL SALPINGOOPHORECTOMY Bilateral    staging biopsies    FAMILY HISTORY :   Family History  Problem Relation Age of Onset  . Heart attack Mother   . Breast cancer Maternal Aunt     x 2    SOCIAL HISTORY:   Social History  Substance Use Topics  . Smoking status: Never Smoker  . Smokeless tobacco: Never Used  . Alcohol use No    ALLERGIES:  is allergic to penicillins; acetaminophen; hydrocodone-acetaminophen; novocain [procaine]; and sulfa antibiotics.  MEDICATIONS:  Current Outpatient Prescriptions  Medication Sig Dispense Refill  . apixaban (ELIQUIS) 5 MG TABS tablet Take 1 tablet (5 mg total) by mouth 2 (two) times daily. 60 tablet 5  . clopidogrel (PLAVIX) 75 MG tablet Take 75 mg by mouth daily.    Mar[place] Kitchen donepezil (ARICEPT) 5 MG tablet Take 5 mg by mouth at bedtime.    . fentaNYL (DURAGESIC - DOSED MCG/HR) 75 MCG/HR Place 1 patch (75 mcg total) onto the skin every 3 (three) days. 10 patch 0  . gabapentin (NEURONTIN) 300 MG capsule Take 1 capsule (300 mg total) by mouth 3 (three) times daily. 90 capsule 2  . megestrol (MEGACE) 40 MG tablet Take 1 tablet (40 mg total) by mouth 4 (four) times daily. (Patient taking differently: Take 80 mg by mouth 2 (two) times daily. ) 120 tablet 3  . meloxicam (MOBIC) 15 MG tablet Take 15 mg by mouth daily.    . Multiple Vitamins-Minerals (MULTIVITAMIN WITH MINERALS) tablet Take 1 tablet by mouth daily. Reported on 06/30/2015    . oxyCODONE-acetaminophen  (PERCOCET) 10-325 MG tablet One pill three times a day as needed for pain 90 tablet 0  . VESICARE 10 MG tablet Take 1 tablet by mouth daily.     No current facility-administered medications for this visit.     PHYSICAL EXAMINATION: ECOG PERFORMANCE STATUS: 1 - Symptomatic but completely ambulatory  BP 130/68 (BP Location: Left Arm, Patient Position: Sitting)   Pulse (!) 58   Temp 97.2 F (36.2 C) (Tympanic)   Resp 18   Wt 125 lb (56.7 kg)   BMI 22.86 kg/m   Filed Weights   04/17/16 1000  Weight: 125 lb (56.7 kg)    GENERAL: [place] Caucasian female patient walking herself Alert, no distress and comfortable. She Is accompanied by her husband.    EYES: no pallor or icterus OROPHARYNX: no thrush or ulceration; good dentition  NECK: supple, no masses felt LYMPH:  no palpable lymphadenopathy in the cervical, axillary or inguinal regions LUNGS: clear to auscultation and  No wheeze or crackles HEART/CVS: regular rate & rhythm and no murmurs; Left lower extremity swollen compared to the right.  ABDOMEN:abdomen soft, non-tender and normal bowel sounds Musculoskeletal:no cyanosis of digits and no clubbing  PSYCH: alert & oriented x 3 with fluent speech NEURO: no focal motor/sensory deficits SKIN:  no rashes or significant lesions  LABORATORY DATA:  I have reviewed the data as listed    Component Value Date/Time   NA 140 04/17/2016 0931   NA 139 11/04/2013 1257   K 3.7 04/17/2016 0931   K 4.3 03/05/2014 1156   CL 107 04/17/2016 0931   CL 106 11/04/2013 1257   CO2 26 04/17/2016 0931   CO2 26 11/04/2013 1257   GLUCOSE 99 04/17/2016 0931   GLUCOSE 96 11/04/2013 1257   BUN 26 (H) 04/17/2016 0931   BUN 13 11/04/2013 1257   CREATININE 1.09 (H) 04/17/2016 0931   CREATININE 0.90 11/04/2013 1257   CALCIUM 8.9 04/17/2016 0931   CALCIUM 8.5 11/04/2013 1257   PROT 6.7 04/17/2016 0931   PROT 6.8 11/04/2013 1257   ALBUMIN 3.7 04/17/2016 0931   ALBUMIN 3.5 11/04/2013 1257    AST 19 04/17/2016 0931   AST 25 11/04/2013 1257   ALT 13 (L) 04/17/2016 0931   ALT 20 11/04/2013 1257   ALKPHOS 31 (L) 04/17/2016 0931   ALKPHOS 64 11/04/2013 1257   BILITOT 0.5 04/17/2016 0931   BILITOT 0.5 11/04/2013 1257  GFRNONAA 45 (L) 04/17/2016 0931   GFRNONAA >60 11/04/2013 1257   GFRNONAA >60 03/29/2012 1650   GFRAA 52 (L) 04/17/2016 0931   GFRAA >60 11/04/2013 1257   GFRAA >60 03/29/2012 1650    No results found for: SPEP, UPEP  Lab Results  Component Value Date   WBC 4.3 04/17/2016   NEUTROABS 2.6 04/17/2016   HGB 14.2 04/17/2016   HCT 41.1 04/17/2016   MCV 95.1 04/17/2016   PLT 228 04/17/2016      Chemistry      Component Value Date/Time   NA 140 04/17/2016 0931   NA 139 11/04/2013 1257   K 3.7 04/17/2016 0931   K 4.3 03/05/2014 1156   CL 107 04/17/2016 0931   CL 106 11/04/2013 1257   CO2 26 04/17/2016 0931   CO2 26 11/04/2013 1257   BUN 26 (H) 04/17/2016 0931   BUN 13 11/04/2013 1257   CREATININE 1.09 (H) 04/17/2016 0931   CREATININE 0.90 11/04/2013 1257      Component Value Date/Time   CALCIUM 8.9 04/17/2016 0931   CALCIUM 8.5 11/04/2013 1257   ALKPHOS 31 (L) 04/17/2016 0931   ALKPHOS 64 11/04/2013 1257   AST 19 04/17/2016 0931   AST 25 11/04/2013 1257   ALT 13 (L) 04/17/2016 0931   ALT 20 11/04/2013 1257   BILITOT 0.5 04/17/2016 0931   BILITOT 0.5 11/04/2013 1257       RADIOGRAPHIC STUDIES: I have personally reviewed the radiological images as listed and agreed with the findings in the report. No results found.   ASSESSMENT & PLAN:  Endometrial cancer ([place]) Metastatic endometrial cancer to the bone- currently on Megace. Tolerating it well. PET- NOV 2017- Stable right pelvic mets/ right paratrach LN/ stable; but clinically complains of increasing pain. Currently s/p palliative RT to right hip- improved.  Continue Megace for now.no clinical progression noted.   # Pain - improved. on fentanyl patch to 75/ and percocet to 10 every 8-12  hours. New script for fentanyl given.   # Swelling of the left lower extremity;s/p IVC filter.  Aug 2017- Dopplers of the lower extremity DVT extensive; Eliquis  5 mg BID improved.    # ? Dementia- on aricept/ prevagen [non-estrogen] as per PCP.   # Metastatic lesions to the bone on X-geva- every 4 weeks. Tolerating well.  Ca 8.9; ca+ vit D.   # Port flushed every 8 weeks.   # labs/x-geva//MD; PET prior.   Orders Placed This Encounter  Procedures  . NM PET Image Restag (PS) Skull Base To Thigh    Standing Status:   Future    Standing Expiration Date:   06/17/2017    Order Specific Question:   Reason for Exam (SYMPTOM  OR DIAGNOSIS REQUIRED)    Answer:   endometrial cancer    Order Specific Question:   Preferred imaging location?    Answer:   [facility]      Cammie Sickle, MD 04/17/2016 2:03 PM

## 2016-04-19 ENCOUNTER — Telehealth: Payer: Self-pay | Admitting: *Deleted

## 2016-04-19 NOTE — Telephone Encounter (Signed)
Returned phone call to Traci [name] at [facility] [phone]. Pt c/o of ?upper tooth pain r/t to bridge. Pt may require an extraction. Calling to obtain medical clearance for this procedure.  Needs office notes and med list on patient.  I explained to Traci [name] that pt is on xgeva, elquis and plavix. The xgeva alone puts pt at risk factor for osteonecrosis of jaw. The anticoags- would put pt at risk for bleeding. Dr. Rogue Bussing would approve a dental exam and xrays, but no procedures at this time (without oncologist approval).  Records and notes will be faxed this afternoon.  Pt has an apt tomorrow pm in dental office.

## 2016-04-19 NOTE — Telephone Encounter (Signed)
Records needs to be faxed to [phone]

## 2016-05-08 ENCOUNTER — Other Ambulatory Visit: Payer: Self-pay | Admitting: *Deleted

## 2016-05-08 DIAGNOSIS — C541 Malignant neoplasm of endometrium: Secondary | ICD-10-CM

## 2016-05-08 MED ORDER — OXYCODONE-ACETAMINOPHEN 10-325 MG PO TABS: ORAL_TABLET | ORAL | 0 refills | Status: DC

## 2016-05-18 ENCOUNTER — Encounter
Admission: RE | Admit: 2016-05-18 | Discharge: 2016-05-18 | Disposition: A | Discharge status: Home / Self Care | Payer: Medicare Other | Source: Ambulatory Visit | Attending: Internal Medicine | Admitting: Internal Medicine

## 2016-05-18 DIAGNOSIS — C541 Malignant neoplasm of endometrium: Secondary | ICD-10-CM | POA: Insufficient documentation

## 2016-05-18 LAB — GLUCOSE, CAPILLARY: GLUCOSE-CAPILLARY: 83 mg/dL (ref 65–99)

## 2016-05-18 MED ORDER — FLUDEOXYGLUCOSE F - 18 (FDG) INJECTION
12.0000 | Freq: Once | INTRAVENOUS | Status: AC | PRN
  Administered 2016-05-18: 11.494 via INTRAVENOUS

## 2016-05-19 ENCOUNTER — Inpatient Hospital Stay (HOSPITAL_BASED_OUTPATIENT_CLINIC_OR_DEPARTMENT_OTHER): Payer: Medicare Other | Admitting: Internal Medicine

## 2016-05-19 ENCOUNTER — Inpatient Hospital Stay: Payer: Medicare Other

## 2016-05-19 ENCOUNTER — Inpatient Hospital Stay: Payer: Medicare Other | Attending: Internal Medicine

## 2016-05-19 VITALS — BP 149/80 | HR 78 | Temp 97.8°F | Resp 18 | Ht 62.0 in | Wt 123.0 lb

## 2016-05-19 DIAGNOSIS — C7951 Secondary malignant neoplasm of bone: Secondary | ICD-10-CM

## 2016-05-19 DIAGNOSIS — M1990 Unspecified osteoarthritis, unspecified site: Secondary | ICD-10-CM | POA: Diagnosis not present

## 2016-05-19 DIAGNOSIS — N1330 Unspecified hydronephrosis: Secondary | ICD-10-CM

## 2016-05-19 DIAGNOSIS — C50812 Malignant neoplasm of overlapping sites of left female breast: Secondary | ICD-10-CM

## 2016-05-19 DIAGNOSIS — Z9221 Personal history of antineoplastic chemotherapy: Secondary | ICD-10-CM | POA: Insufficient documentation

## 2016-05-19 DIAGNOSIS — Z86718 Personal history of other venous thrombosis and embolism: Secondary | ICD-10-CM | POA: Insufficient documentation

## 2016-05-19 DIAGNOSIS — C7911 Secondary malignant neoplasm of bladder: Secondary | ICD-10-CM | POA: Diagnosis not present

## 2016-05-19 DIAGNOSIS — Z79899 Other long term (current) drug therapy: Secondary | ICD-10-CM | POA: Diagnosis not present

## 2016-05-19 DIAGNOSIS — K219 Gastro-esophageal reflux disease without esophagitis: Secondary | ICD-10-CM | POA: Diagnosis not present

## 2016-05-19 DIAGNOSIS — Z8619 Personal history of other infectious and parasitic diseases: Secondary | ICD-10-CM | POA: Diagnosis not present

## 2016-05-19 DIAGNOSIS — C541 Malignant neoplasm of endometrium: Secondary | ICD-10-CM

## 2016-05-19 DIAGNOSIS — Z90722 Acquired absence of ovaries, bilateral: Secondary | ICD-10-CM | POA: Diagnosis not present

## 2016-05-19 DIAGNOSIS — Z923 Personal history of irradiation: Secondary | ICD-10-CM

## 2016-05-19 DIAGNOSIS — Z170 Estrogen receptor positive status [ER+]: Secondary | ICD-10-CM

## 2016-05-19 DIAGNOSIS — M25551 Pain in right hip: Secondary | ICD-10-CM

## 2016-05-19 DIAGNOSIS — N189 Chronic kidney disease, unspecified: Secondary | ICD-10-CM | POA: Diagnosis not present

## 2016-05-19 DIAGNOSIS — F0390 Unspecified dementia without behavioral disturbance: Secondary | ICD-10-CM

## 2016-05-19 DIAGNOSIS — Z8673 Personal history of transient ischemic attack (TIA), and cerebral infarction without residual deficits: Secondary | ICD-10-CM | POA: Insufficient documentation

## 2016-05-19 DIAGNOSIS — Z7902 Long term (current) use of antithrombotics/antiplatelets: Secondary | ICD-10-CM

## 2016-05-19 DIAGNOSIS — Z86010 Personal history of colonic polyps: Secondary | ICD-10-CM | POA: Insufficient documentation

## 2016-05-19 DIAGNOSIS — Z90710 Acquired absence of both cervix and uterus: Secondary | ICD-10-CM | POA: Insufficient documentation

## 2016-05-19 DIAGNOSIS — Z803 Family history of malignant neoplasm of breast: Secondary | ICD-10-CM | POA: Insufficient documentation

## 2016-05-19 DIAGNOSIS — Z880 Allergy status to penicillin: Secondary | ICD-10-CM | POA: Insufficient documentation

## 2016-05-19 DIAGNOSIS — Z86711 Personal history of pulmonary embolism: Secondary | ICD-10-CM

## 2016-05-19 DIAGNOSIS — Z8541 Personal history of malignant neoplasm of cervix uteri: Secondary | ICD-10-CM | POA: Diagnosis not present

## 2016-05-19 LAB — CBC WITH DIFFERENTIAL/PLATELET
Basophils Absolute: 0 10*3/uL (ref 0–0.1)
Basophils Relative: 1 %
Eosinophils Absolute: 0.2 10*3/uL (ref 0–0.7)
Eosinophils Relative: 4 %
HEMATOCRIT: 41.7 % (ref 35.0–47.0)
Hemoglobin: 14.4 g/dL (ref 12.0–16.0)
LYMPHS PCT: 19 %
Lymphs Abs: 1 10*3/uL (ref 1.0–3.6)
MCH: 32.4 pg (ref 26.0–34.0)
MCHC: 34.4 g/dL (ref 32.0–36.0)
MCV: 94 fL (ref 80.0–100.0)
MONO ABS: 0.5 10*3/uL (ref 0.2–0.9)
MONOS PCT: 9 %
Neutro Abs: 3.7 10*3/uL (ref 1.4–6.5)
Neutrophils Relative %: 67 %
Platelets: 227 10*3/uL (ref 150–440)
RBC: 4.44 MIL/uL (ref 3.80–5.20)
RDW: 13.2 % (ref 11.5–14.5)
WBC: 5.5 10*3/uL (ref 3.6–11.0)

## 2016-05-19 LAB — COMPREHENSIVE METABOLIC PANEL
ALT: 15 U/L (ref 14–54)
AST: 20 U/L (ref 15–41)
Albumin: 4 g/dL (ref 3.5–5.0)
Alkaline Phosphatase: 31 U/L — ABNORMAL LOW (ref 38–126)
Anion gap: 7 (ref 5–15)
BILIRUBIN TOTAL: 1 mg/dL (ref 0.3–1.2)
BUN: 24 mg/dL — AB (ref 6–20)
CO2: 26 mmol/L (ref 22–32)
Calcium: 9.2 mg/dL (ref 8.9–10.3)
Chloride: 105 mmol/L (ref 101–111)
Creatinine, Ser: 1.24 mg/dL — ABNORMAL HIGH (ref 0.44–1.00)
GFR calc Af Amer: 45 mL/min — ABNORMAL LOW (ref >60)
GFR, EST NON AFRICAN AMERICAN: 39 mL/min — AB (ref >60)
GLUCOSE: 106 mg/dL — AB (ref 65–99)
Potassium: 4 mmol/L (ref 3.5–5.1)
Sodium: 138 mmol/L (ref 135–145)
TOTAL PROTEIN: 7 g/dL (ref 6.5–8.1)

## 2016-05-19 MED ORDER — DENOSUMAB 120 MG/1.7ML ~~LOC~~ SOLN
120.0000 mg | Freq: Once | SUBCUTANEOUS | Status: AC
  Administered 2016-05-19: 120 mg via SUBCUTANEOUS
  Filled 2016-05-19: qty 1.7

## 2016-05-19 NOTE — Progress Notes (Signed)
[place] OFFICE PROGRESS NOTE  Patient Care Team: Idelle Crouch, MD as PCP - General (Unknown Physician Specialty) [name], MD as Referring Physician (Surgery) [name], MD as Consulting Physician (Urology)  Cancer of left breast [facility])   Staging form: Breast, AJCC 7th Edition     Clinical: Stage IA (T1b, N0, M0) - Signed by Forest Gleason, MD on 10/20/2014    Oncology History   . Patient has a previous history of endometrium carcinoma of endometrium stage IB disease status post bilateral salpingo-oophorectomy and radiation therapy 4. Right hydronephrosis detected on MRI scan off lumbosacral spine. Cystoscopy with attempted stent placement revealed bladder tumor in September of 2016 biopsies positive for recurrent endometrial cancer  5. Patient had stent placement in the right kidney Started radiation therapy 6. Patient has finished radiation therapy with relief in the pain (November, 2016) 7. Started on Megace in December of 2016 8 deep vein thrombosis in the left lower extremity (February 08, 2014) Hematuria due to xeralto IVC filter placement on February 11, 2015 She is off xeralto 9.condition recently had a cystoscopy no evidence of bleeding was found so patient has been started on ELOQUIS (March 15, 2015)  10. Patient again had a significant bleeding with hematuria with eloquis and has been discontinued; Patient is on Plavix. ON  Megace. AUG 2017- PET IMPROVED; mediastinal uptake- monitor for now.   # August 25 2015- LLE DVT- start Eliquis [stop plavix]; PET  FEB 2017-LEFT BREAST CA [9'0 clock- overlapping site] Estrogen receptor positive. Progesterone receptor positive. HER-2 receptor; Her 2neg; 5 mm tumor clinically stage IB N0 M0 tumor;s/p Lumpectomy; NO RT  # PET NOV 27th- STABLE right iliac lesions [increasing pain]/ right paratrac- refer to RT  # X-geva q 4 w  # MOLECULAR STUDIES: 08/23/15- MMR-STABLE.      Cancer  of left breast ([place])   10/20/2014 Initial Diagnosis    Cancer of left breast       Endometrial cancer ([place])   11/18/2014 Initial Diagnosis    Endometrial cancer ([place])        INTERVAL HISTORY:  Traci [name] 83 y.o.  female pleasant patient above history of Metastatic endometrial cancer currently on Megace- Is here for follow-up/ Accompanied by her husband; nephew.   In the interim patient was diagnosed with dementia; was evaluated by neurology this morning. Patient has been started on Aricept; and prevagen- by PCP for dementia. Husband thinks her memory is getting worse.  Patient continues to have right hip pain; currently stable on fentanyl patch. She is also needing to take breakthrough pain medication up to 2 times a day. No bleeding. Appetite is good. No weight loss. Had a recent episode diarrhea. Otherwise resolved.  REVIEW OF SYSTEMS:  A complete 10 point review of system is done which is negative except mentioned above/history of present illness.   PAST MEDICAL HISTORY :  Past Medical History:  Diagnosis Date  . Arthritis    Degenerative Disc Disease  . Bone cancer ([employer])   . Breast cancer ([place]) 2016   LT LUMPECTOMY 03-2015  . Chronic kidney disease    HYDRONEPHROSIS  . Deep vein blood clot of left lower extremity ([place]) February 11, 2015   Left Leg  . Endometrial adenocarcinoma ([place]) 01/2012    stage Ib, grade 1, positive peritoneal cytology, no other risk factors or extra-uterine disease  . Gastric ulcer   . GERD (gastroesophageal reflux disease)   . Hearing loss   .  Hiccups   . History of brachytherapy   . History of colon polyps   . History of DVT (deep vein thrombosis)   . History of esophageal stricture   . History of shingles   . Inguinal lymphocyst   . Mild aphasia   . Pain    SEVERE CHRONIC RIGHT LEG,HIP,FOOT  . PE (pulmonary embolism)    H/O  . Stroke ([place]) 04/06/2011   residual:  aphasia    PAST SURGICAL HISTORY :   Past Surgical History:   Procedure Laterality Date  . ABDOMINAL HYSTERECTOMY  December 2013   history of cervical cancer  . BACK SURGERY     fusion of L3&4  . BREAST BIOPSY Left 10/13/2014   POS  . CATARACT EXTRACTION W/ INTRAOCULAR LENS IMPLANT     right  . CYSTOSCOPY W/ RETROGRADES Right 10/28/2014   Procedure: CYSTOSCOPY WITH RETROGRADE PYELOGRAM ATTEMPT, UNABLE TO PASS ;  Surgeon: [name], MD;  Location: [facility];  Service: Urology;  Laterality: Right;  . CYSTOSCOPY W/ RETROGRADES Right 09/15/2015   Procedure: CYSTOSCOPY WITH RETROGRADE PYELOGRAM;  Surgeon: [name], MD;  Location: [facility];  Service: Urology;  Laterality: Right;  . CYSTOSCOPY W/ URETERAL STENT PLACEMENT Right 03/10/2015   Procedure: CYSTOSCOPY WITH STENT REPLACEMENT;  Surgeon: [name], MD;  Location: [facility];  Service: Urology;  Laterality: Right;  . CYSTOSCOPY W/ URETERAL STENT PLACEMENT Right 06/09/2015   Procedure: CYSTOSCOPY WITH STENT REPLACEMENT;  Surgeon: [name], MD;  Location: [facility];  Service: Urology;  Laterality: Right;  . CYSTOSCOPY W/ URETERAL STENT PLACEMENT Right 09/15/2015   Procedure: CYSTOSCOPY WITH STENT REPLACEMENT;  Surgeon: [name], MD;  Location: [facility];  Service: Urology;  Laterality: Right;  . CYSTOSCOPY WITH STENT PLACEMENT Right 10/28/2014   Procedure: BLADDER BIOPSY WITH FULGERATION ;  Surgeon: [name], MD;  Location: [facility];  Service: Urology;  Laterality: Right;  . ESOPHAGEAL DILATION    . EYE SURGERY Bilateral    Cataract Extraction  . PARTIAL MASTECTOMY WITH NEEDLE LOCALIZATION Left 03/30/2015   Procedure: PARTIAL MASTECTOMY WITH NEEDLE LOCALIZATION;  Surgeon: [name], MD;  Location: [facility];  Service: General;  Laterality: Left;  . PERIPHERAL VASCULAR CATHETERIZATION N/A 02/11/2015   Procedure: IVC Filter Insertion;  Surgeon: Algernon Huxley, MD;  Location: [place] [facility];  Service: Cardiovascular;  Laterality: N/A;  . PORTACATH  PLACEMENT Right 11/26/2014   Procedure: INSERTION PORT-A-CATH;  Surgeon: [name], MD;  Location: [facility];  Service: General;  Laterality: Right;  . SENTINEL NODE BIOPSY Left 03/30/2015   Procedure: SENTINEL NODE BIOPSY;  Surgeon: [name], MD;  Location: [facility];  Service: General;  Laterality: Left;  . thyroid removed    . THYROIDECTOMY, PARTIAL    . TONSILLECTOMY    . TOTAL ABDOMINAL HYSTERECTOMY W/ BILATERAL SALPINGOOPHORECTOMY Bilateral    staging biopsies    FAMILY HISTORY :   Family History  Problem Relation Age of Onset  . Heart attack Mother   . Breast cancer Maternal Aunt     x 2    SOCIAL HISTORY:   Social History  Substance Use Topics  . Smoking status: Never Smoker  . Smokeless tobacco: Never Used  . Alcohol use No    ALLERGIES:  is allergic to penicillins; acetaminophen; hydrocodone-acetaminophen; novocain [procaine]; and sulfa antibiotics.  MEDICATIONS:  Current Outpatient Prescriptions  Medication Sig Dispense Refill  . apixaban (ELIQUIS) 5 MG TABS tablet Take  1 tablet (5 mg total) by mouth 2 (two) times daily. 60 tablet 5  . clopidogrel (PLAVIX) 75 MG tablet Take 75 mg by mouth daily.    Marland Kitchen donepezil (ARICEPT) 5 MG tablet Take 5 mg by mouth at bedtime.    . fentaNYL (DURAGESIC - DOSED MCG/HR) 75 MCG/HR Place 1 patch (75 mcg total) onto the skin every 3 (three) days. 10 patch 0  . gabapentin (NEURONTIN) 300 MG capsule Take 1 capsule (300 mg total) by mouth 3 (three) times daily. 90 capsule 2  . Multiple Vitamins-Minerals (MULTIVITAMIN WITH MINERALS) tablet Take 1 tablet by mouth daily. Reported on 06/30/2015    . oxyCODONE-acetaminophen (PERCOCET) 10-325 MG tablet One pill three times a day as needed for pain 90 tablet 0  . megestrol (MEGACE) 40 MG tablet Take 1 tablet (40 mg total) by mouth 4 (four) times daily. (Patient taking differently: Take 80 mg by mouth 2 (two) times daily. ) 120 tablet 3  . meloxicam (MOBIC) 15 MG tablet Take 15 mg  by mouth daily.    . VESICARE 10 MG tablet Take 1 tablet by mouth daily.     No current facility-administered medications for this visit.     PHYSICAL EXAMINATION: ECOG PERFORMANCE STATUS: 1 - Symptomatic but completely ambulatory  BP (!) 149/80   Pulse 78   Temp 97.8 F (36.6 C) (Tympanic)   Resp 18   Ht '5\' 2"'$  (1.575 m)   Wt 123 lb (55.8 kg)   BMI 22.50 kg/m   Filed Weights   05/19/16 1505  Weight: 123 lb (55.8 kg)    GENERAL: [place] Caucasian female patient walking herself Alert, no distress and comfortable. She Is accompanied by her husband; nephew. EYES: no pallor or icterus OROPHARYNX: no thrush or ulceration; good dentition  NECK: supple, no masses felt LYMPH:  no palpable lymphadenopathy in the cervical, axillary or inguinal regions LUNGS: clear to auscultation and  No wheeze or crackles HEART/CVS: regular rate & rhythm and no murmurs; Left lower extremity swollen compared to the right.  ABDOMEN:abdomen soft, non-tender and normal bowel sounds Musculoskeletal:no cyanosis of digits and no clubbing  PSYCH: alert & oriented x 3 with fluent speech NEURO: no focal motor/sensory deficits SKIN:  no rashes or significant lesions  LABORATORY DATA:  I have reviewed the data as listed    Component Value Date/Time   NA 138 05/19/2016 1430   NA 139 11/04/2013 1257   K 4.0 05/19/2016 1430   K 4.3 03/05/2014 1156   CL 105 05/19/2016 1430   CL 106 11/04/2013 1257   CO2 26 05/19/2016 1430   CO2 26 11/04/2013 1257   GLUCOSE 106 (H) 05/19/2016 1430   GLUCOSE 96 11/04/2013 1257   BUN 24 (H) 05/19/2016 1430   BUN 13 11/04/2013 1257   CREATININE 1.24 (H) 05/19/2016 1430   CREATININE 0.90 11/04/2013 1257   CALCIUM 9.2 05/19/2016 1430   CALCIUM 8.5 11/04/2013 1257   PROT 7.0 05/19/2016 1430   PROT 6.8 11/04/2013 1257   ALBUMIN 4.0 05/19/2016 1430   ALBUMIN 3.5 11/04/2013 1257   AST 20 05/19/2016 1430   AST 25 11/04/2013 1257   ALT 15 05/19/2016 1430   ALT 20  11/04/2013 1257   ALKPHOS 31 (L) 05/19/2016 1430   ALKPHOS 64 11/04/2013 1257   BILITOT 1.0 05/19/2016 1430   BILITOT 0.5 11/04/2013 1257   GFRNONAA 39 (L) 05/19/2016 1430   GFRNONAA >60 11/04/2013 1257   GFRNONAA >60 03/29/2012 1650   GFRAA  45 (L) 05/19/2016 1430   GFRAA >60 11/04/2013 1257   GFRAA >60 03/29/2012 1650    No results found for: SPEP, UPEP  Lab Results  Component Value Date   WBC 5.5 05/19/2016   NEUTROABS 3.7 05/19/2016   HGB 14.4 05/19/2016   HCT 41.7 05/19/2016   MCV 94.0 05/19/2016   PLT 227 05/19/2016      Chemistry      Component Value Date/Time   NA 138 05/19/2016 1430   NA 139 11/04/2013 1257   K 4.0 05/19/2016 1430   K 4.3 03/05/2014 1156   CL 105 05/19/2016 1430   CL 106 11/04/2013 1257   CO2 26 05/19/2016 1430   CO2 26 11/04/2013 1257   BUN 24 (H) 05/19/2016 1430   BUN 13 11/04/2013 1257   CREATININE 1.24 (H) 05/19/2016 1430   CREATININE 0.90 11/04/2013 1257      Component Value Date/Time   CALCIUM 9.2 05/19/2016 1430   CALCIUM 8.5 11/04/2013 1257   ALKPHOS 31 (L) 05/19/2016 1430   ALKPHOS 64 11/04/2013 1257   AST 20 05/19/2016 1430   AST 25 11/04/2013 1257   ALT 15 05/19/2016 1430   ALT 20 11/04/2013 1257   BILITOT 1.0 05/19/2016 1430   BILITOT 0.5 11/04/2013 1257       RADIOGRAPHIC STUDIES: I have personally reviewed the radiological images as listed and agreed with the findings in the report. Nm Pet Image Restag (ps) Skull Base To Thigh  Result Date: 05/18/2016 CLINICAL DATA:  The subsequent treatment strategy for endometrial cancer. EXAM: NUCLEAR MEDICINE PET SKULL BASE TO THIGH TECHNIQUE: 11.5 mCi F-18 FDG was injected intravenously. Full-ring PET imaging was performed from the skull base to thigh after the radiotracer. CT data was obtained and used for attenuation correction and anatomic localization. FASTING BLOOD GLUCOSE:  Value: 83 mg/dl COMPARISON:  01/03/2016 FINDINGS: NECK Patient is status post right hemithyroidectomy.  Expected low level FDG uptake identified in the residual left thyroid lobe. CHEST Bold with 7 mm low right paratracheal lymph node described on the prior study is 6 mm today (image 71 series 3). With a the small lymph node remains hypermetabolic with SUV max = 5 compared to 5 previously. Right Port-A-Cath tip again noted at the SVC/ RA junction Biapical pleural-parenchymal scarring noted. Ill-defined area of airspace opacity measures about 11 mm anterior left upper lobe, slightly more confluent in the interval. ABDOMEN/PELVIS No abnormal hypermetabolic activity within the liver, pancreas, adrenal glands, or spleen. No hypermetabolic lymph nodes in the abdomen or pelvis. SKELETON Stable appearance of the right iliac bone lesion measuring 3.6 x 3.4 cm today compared to 3.7 x 3.5 cm previously. The the low level FDG uptake in lesion again noted, similar to prior. The IMPRESSION: 1. The small low right paratracheal lymph node is stable in size on CT imaging in shows persistent stable hypermetabolism. 2. No change in appearance or FDG uptake in the right iliac wing lesion. 3. No new or progressive findings on today's study. The patient does have a small focus of apparent ground-glass airspace attenuation in the anterior left upper lobe slightly more confluent than on the prior study. Attention on followup recommended. Electronically Signed   By: Misty Stanley M.D.   On: 05/18/2016 16:08     ASSESSMENT & PLAN:  Endometrial cancer ([place]) Metastatic endometrial cancer to the bone- currently on Megace. Tolerating it well. PET- April 2018- Stable right pelvic mets/ right paratrach LN/ stable;  Currently s/p palliative RT to right  hip- improved.  Continue Megace for now.no clinical progression noted.   # Pain - improved. on fentanyl patch to 75/ and percocet to 10 every 8-12 hours.   # Swelling of the left lower extremity;s/p IVC filter. Aug 2017- Dopplers of the lower extremity DVT extensive; Eliquis  5 mg BID  improved.    # ? Dementia- on aricept/ prevagen [non-estrogen] as per PCP; recent evaluation with Dr.Shah  # Metastatic lesions to the bone on X-geva- every 4 weeks. Tolerating well.  Ca 8.9; ca+ vit D.   # Port flushed every 8 weeks.   # labs/X-geva/ MD follow up- 4 weeks.   No orders of the defined types were placed in this encounter.     Cammie Sickle, MD 05/19/2016 4:46 PM

## 2016-05-19 NOTE — Assessment & Plan Note (Addendum)
Metastatic endometrial cancer to the bone- currently on Megace. Tolerating it well. PET- April 2018- Stable right pelvic mets/ right paratrach LN/ stable;  Currently s/p palliative RT to right hip- improved.  Continue Megace for now.no clinical progression noted.   # Pain - improved. on fentanyl patch to 75/ and percocet to 10 every 8-12 hours.   # Swelling of the left lower extremity;s/p IVC filter. Aug 2017- Dopplers of the lower extremity DVT extensive; Eliquis  5 mg BID improved.    # ? Dementia- on aricept/ prevagen [non-estrogen] as per PCP; recent evaluation with Dr.Shah  # Metastatic lesions to the bone on X-geva- every 4 weeks. Tolerating well.  Ca 8.9; ca+ vit D.   # Port flushed every 8 weeks.   # labs/X-geva/ MD follow up- 4 weeks.

## 2016-05-19 NOTE — Progress Notes (Signed)
Patient saw Dr. Manuella Ghazi in neurology - husband states that neurology has ordered for MRI brain without contrast to further evaluate memory loss.  Pt states that she needs a RF on aricept. Dr. Doy Hutching prescribed.   Poor historian for medications today. Pt does not recall if she is taking Megace and Mobic.

## 2016-05-22 ENCOUNTER — Other Ambulatory Visit: Payer: Self-pay | Admitting: Neurology

## 2016-05-22 DIAGNOSIS — R413 Other amnesia: Secondary | ICD-10-CM

## 2016-05-30 ENCOUNTER — Ambulatory Visit
Admission: RE | Admit: 2016-05-30 | Discharge: 2016-05-30 | Disposition: A | Discharge status: Home / Self Care | Payer: Medicare Other | Source: Ambulatory Visit | Attending: Neurology | Admitting: Neurology

## 2016-05-30 DIAGNOSIS — Z8673 Personal history of transient ischemic attack (TIA), and cerebral infarction without residual deficits: Secondary | ICD-10-CM | POA: Insufficient documentation

## 2016-05-30 DIAGNOSIS — R413 Other amnesia: Secondary | ICD-10-CM | POA: Insufficient documentation

## 2016-06-02 ENCOUNTER — Other Ambulatory Visit: Payer: Self-pay | Admitting: Internal Medicine

## 2016-06-02 DIAGNOSIS — C541 Malignant neoplasm of endometrium: Secondary | ICD-10-CM

## 2016-06-02 DIAGNOSIS — R630 Anorexia: Secondary | ICD-10-CM

## 2016-06-12 ENCOUNTER — Other Ambulatory Visit: Payer: Self-pay | Admitting: *Deleted

## 2016-06-12 ENCOUNTER — Inpatient Hospital Stay: Payer: Medicare Other | Attending: Internal Medicine

## 2016-06-12 DIAGNOSIS — R55 Syncope and collapse: Secondary | ICD-10-CM | POA: Insufficient documentation

## 2016-06-12 DIAGNOSIS — Z95828 Presence of other vascular implants and grafts: Secondary | ICD-10-CM

## 2016-06-12 DIAGNOSIS — Z7902 Long term (current) use of antithrombotics/antiplatelets: Secondary | ICD-10-CM | POA: Insufficient documentation

## 2016-06-12 DIAGNOSIS — Z8619 Personal history of other infectious and parasitic diseases: Secondary | ICD-10-CM | POA: Diagnosis not present

## 2016-06-12 DIAGNOSIS — Z79899 Other long term (current) drug therapy: Secondary | ICD-10-CM | POA: Diagnosis not present

## 2016-06-12 DIAGNOSIS — Z8541 Personal history of malignant neoplasm of cervix uteri: Secondary | ICD-10-CM | POA: Insufficient documentation

## 2016-06-12 DIAGNOSIS — R600 Localized edema: Secondary | ICD-10-CM | POA: Insufficient documentation

## 2016-06-12 DIAGNOSIS — Z90722 Acquired absence of ovaries, bilateral: Secondary | ICD-10-CM | POA: Insufficient documentation

## 2016-06-12 DIAGNOSIS — C541 Malignant neoplasm of endometrium: Secondary | ICD-10-CM | POA: Insufficient documentation

## 2016-06-12 DIAGNOSIS — Z90710 Acquired absence of both cervix and uterus: Secondary | ICD-10-CM | POA: Insufficient documentation

## 2016-06-12 DIAGNOSIS — M79662 Pain in left lower leg: Secondary | ICD-10-CM

## 2016-06-12 DIAGNOSIS — M7989 Other specified soft tissue disorders: Secondary | ICD-10-CM

## 2016-06-12 DIAGNOSIS — K219 Gastro-esophageal reflux disease without esophagitis: Secondary | ICD-10-CM | POA: Diagnosis not present

## 2016-06-12 DIAGNOSIS — Z803 Family history of malignant neoplasm of breast: Secondary | ICD-10-CM | POA: Insufficient documentation

## 2016-06-12 DIAGNOSIS — Z86711 Personal history of pulmonary embolism: Secondary | ICD-10-CM | POA: Insufficient documentation

## 2016-06-12 DIAGNOSIS — Z9221 Personal history of antineoplastic chemotherapy: Secondary | ICD-10-CM | POA: Diagnosis not present

## 2016-06-12 DIAGNOSIS — C50812 Malignant neoplasm of overlapping sites of left female breast: Secondary | ICD-10-CM | POA: Insufficient documentation

## 2016-06-12 DIAGNOSIS — Z86010 Personal history of colonic polyps: Secondary | ICD-10-CM | POA: Insufficient documentation

## 2016-06-12 DIAGNOSIS — Z8673 Personal history of transient ischemic attack (TIA), and cerebral infarction without residual deficits: Secondary | ICD-10-CM | POA: Diagnosis not present

## 2016-06-12 DIAGNOSIS — Z86718 Personal history of other venous thrombosis and embolism: Secondary | ICD-10-CM | POA: Insufficient documentation

## 2016-06-12 DIAGNOSIS — Z923 Personal history of irradiation: Secondary | ICD-10-CM | POA: Insufficient documentation

## 2016-06-12 DIAGNOSIS — C7911 Secondary malignant neoplasm of bladder: Secondary | ICD-10-CM | POA: Insufficient documentation

## 2016-06-12 DIAGNOSIS — C7951 Secondary malignant neoplasm of bone: Secondary | ICD-10-CM | POA: Diagnosis not present

## 2016-06-12 DIAGNOSIS — M25551 Pain in right hip: Secondary | ICD-10-CM | POA: Insufficient documentation

## 2016-06-12 DIAGNOSIS — F0390 Unspecified dementia without behavioral disturbance: Secondary | ICD-10-CM | POA: Diagnosis not present

## 2016-06-12 DIAGNOSIS — Z880 Allergy status to penicillin: Secondary | ICD-10-CM | POA: Insufficient documentation

## 2016-06-12 DIAGNOSIS — Z170 Estrogen receptor positive status [ER+]: Secondary | ICD-10-CM | POA: Insufficient documentation

## 2016-06-12 DIAGNOSIS — N189 Chronic kidney disease, unspecified: Secondary | ICD-10-CM | POA: Diagnosis not present

## 2016-06-12 LAB — CBC WITH DIFFERENTIAL/PLATELET
BASOS ABS: 0 10*3/uL (ref 0–0.1)
BASOS PCT: 1 %
Eosinophils Absolute: 0.2 10*3/uL (ref 0–0.7)
Eosinophils Relative: 6 %
HEMATOCRIT: 39.7 % (ref 35.0–47.0)
HEMOGLOBIN: 13.8 g/dL (ref 12.0–16.0)
LYMPHS PCT: 26 %
Lymphs Abs: 1 10*3/uL (ref 1.0–3.6)
MCH: 32.6 pg (ref 26.0–34.0)
MCHC: 34.8 g/dL (ref 32.0–36.0)
MCV: 93.8 fL (ref 80.0–100.0)
MONOS PCT: 12 %
Monocytes Absolute: 0.4 10*3/uL (ref 0.2–0.9)
NEUTROS ABS: 2.1 10*3/uL (ref 1.4–6.5)
NEUTROS PCT: 55 %
Platelets: 214 10*3/uL (ref 150–440)
RBC: 4.24 MIL/uL (ref 3.80–5.20)
RDW: 13 % (ref 11.5–14.5)
WBC: 3.8 10*3/uL (ref 3.6–11.0)

## 2016-06-12 LAB — COMPREHENSIVE METABOLIC PANEL
ALBUMIN: 3.6 g/dL (ref 3.5–5.0)
ALK PHOS: 30 U/L — AB (ref 38–126)
ALT: 15 U/L (ref 14–54)
AST: 21 U/L (ref 15–41)
Anion gap: 5 (ref 5–15)
BILIRUBIN TOTAL: 0.9 mg/dL (ref 0.3–1.2)
BUN: 25 mg/dL — AB (ref 6–20)
CALCIUM: 9 mg/dL (ref 8.9–10.3)
CO2: 25 mmol/L (ref 22–32)
CREATININE: 1.11 mg/dL — AB (ref 0.44–1.00)
Chloride: 108 mmol/L (ref 101–111)
GFR calc Af Amer: 51 mL/min — ABNORMAL LOW (ref >60)
GFR calc non Af Amer: 44 mL/min — ABNORMAL LOW (ref >60)
GLUCOSE: 107 mg/dL — AB (ref 65–99)
Potassium: 4 mmol/L (ref 3.5–5.1)
Sodium: 138 mmol/L (ref 135–145)
TOTAL PROTEIN: 6.6 g/dL (ref 6.5–8.1)

## 2016-06-12 MED ORDER — SODIUM CHLORIDE 0.9% FLUSH
10.0000 mL | INTRAVENOUS | Status: DC | PRN
Start: 2016-06-12 — End: 2016-06-12
  Administered 2016-06-12: 10 mL via INTRAVENOUS
  Filled 2016-06-12: qty 10

## 2016-06-12 MED ORDER — HEPARIN SOD (PORK) LOCK FLUSH 100 UNIT/ML IV SOLN
500.0000 [IU] | Freq: Once | INTRAVENOUS | Status: AC
Start: 2016-06-12 — End: 2016-06-12
  Administered 2016-06-12: 500 [IU] via INTRAVENOUS

## 2016-06-12 MED ORDER — OXYCODONE-ACETAMINOPHEN 10-325 MG PO TABS: ORAL_TABLET | ORAL | 0 refills | Status: DC

## 2016-06-21 ENCOUNTER — Inpatient Hospital Stay: Payer: Medicare Other

## 2016-06-21 ENCOUNTER — Inpatient Hospital Stay (HOSPITAL_BASED_OUTPATIENT_CLINIC_OR_DEPARTMENT_OTHER): Payer: Medicare Other | Admitting: Internal Medicine

## 2016-06-21 VITALS — BP 137/64 | HR 56 | Temp 97.4°F | Resp 16 | Wt 122.4 lb

## 2016-06-21 DIAGNOSIS — C50812 Malignant neoplasm of overlapping sites of left female breast: Secondary | ICD-10-CM

## 2016-06-21 DIAGNOSIS — M25551 Pain in right hip: Secondary | ICD-10-CM

## 2016-06-21 DIAGNOSIS — N189 Chronic kidney disease, unspecified: Secondary | ICD-10-CM

## 2016-06-21 DIAGNOSIS — Z8619 Personal history of other infectious and parasitic diseases: Secondary | ICD-10-CM

## 2016-06-21 DIAGNOSIS — Z79899 Other long term (current) drug therapy: Secondary | ICD-10-CM

## 2016-06-21 DIAGNOSIS — Z923 Personal history of irradiation: Secondary | ICD-10-CM

## 2016-06-21 DIAGNOSIS — Z803 Family history of malignant neoplasm of breast: Secondary | ICD-10-CM

## 2016-06-21 DIAGNOSIS — C541 Malignant neoplasm of endometrium: Secondary | ICD-10-CM

## 2016-06-21 DIAGNOSIS — Z9221 Personal history of antineoplastic chemotherapy: Secondary | ICD-10-CM

## 2016-06-21 DIAGNOSIS — Z86718 Personal history of other venous thrombosis and embolism: Secondary | ICD-10-CM

## 2016-06-21 DIAGNOSIS — Z8541 Personal history of malignant neoplasm of cervix uteri: Secondary | ICD-10-CM

## 2016-06-21 DIAGNOSIS — R600 Localized edema: Secondary | ICD-10-CM

## 2016-06-21 DIAGNOSIS — C7911 Secondary malignant neoplasm of bladder: Secondary | ICD-10-CM | POA: Diagnosis not present

## 2016-06-21 DIAGNOSIS — Z90722 Acquired absence of ovaries, bilateral: Secondary | ICD-10-CM

## 2016-06-21 DIAGNOSIS — K219 Gastro-esophageal reflux disease without esophagitis: Secondary | ICD-10-CM

## 2016-06-21 DIAGNOSIS — Z8673 Personal history of transient ischemic attack (TIA), and cerebral infarction without residual deficits: Secondary | ICD-10-CM

## 2016-06-21 DIAGNOSIS — C7951 Secondary malignant neoplasm of bone: Secondary | ICD-10-CM | POA: Diagnosis not present

## 2016-06-21 DIAGNOSIS — Z880 Allergy status to penicillin: Secondary | ICD-10-CM

## 2016-06-21 DIAGNOSIS — F0390 Unspecified dementia without behavioral disturbance: Secondary | ICD-10-CM

## 2016-06-21 DIAGNOSIS — R55 Syncope and collapse: Secondary | ICD-10-CM

## 2016-06-21 DIAGNOSIS — Z7901 Long term (current) use of anticoagulants: Secondary | ICD-10-CM

## 2016-06-21 DIAGNOSIS — Z86711 Personal history of pulmonary embolism: Secondary | ICD-10-CM

## 2016-06-21 DIAGNOSIS — Z170 Estrogen receptor positive status [ER+]: Secondary | ICD-10-CM

## 2016-06-21 DIAGNOSIS — Z90710 Acquired absence of both cervix and uterus: Secondary | ICD-10-CM

## 2016-06-21 DIAGNOSIS — Z86010 Personal history of colonic polyps: Secondary | ICD-10-CM

## 2016-06-21 LAB — CBC WITH DIFFERENTIAL/PLATELET
BASOS ABS: 0 10*3/uL (ref 0–0.1)
BASOS PCT: 1 %
EOS ABS: 0.2 10*3/uL (ref 0–0.7)
EOS PCT: 4 %
HEMATOCRIT: 39.8 % (ref 35.0–47.0)
HEMOGLOBIN: 13.8 g/dL (ref 12.0–16.0)
Lymphocytes Relative: 28 %
Lymphs Abs: 1.1 10*3/uL (ref 1.0–3.6)
MCH: 32.4 pg (ref 26.0–34.0)
MCHC: 34.6 g/dL (ref 32.0–36.0)
MCV: 93.7 fL (ref 80.0–100.0)
MONO ABS: 0.4 10*3/uL (ref 0.2–0.9)
Monocytes Relative: 12 %
Neutro Abs: 2.1 10*3/uL (ref 1.4–6.5)
Neutrophils Relative %: 55 %
Platelets: 218 10*3/uL (ref 150–440)
RBC: 4.25 MIL/uL (ref 3.80–5.20)
RDW: 13.1 % (ref 11.5–14.5)
WBC: 3.9 10*3/uL (ref 3.6–11.0)

## 2016-06-21 LAB — COMPREHENSIVE METABOLIC PANEL
ALBUMIN: 3.8 g/dL (ref 3.5–5.0)
ALK PHOS: 29 U/L — AB (ref 38–126)
ALT: 14 U/L (ref 14–54)
AST: 19 U/L (ref 15–41)
Anion gap: 5 (ref 5–15)
BILIRUBIN TOTAL: 0.7 mg/dL (ref 0.3–1.2)
BUN: 24 mg/dL — AB (ref 6–20)
CO2: 26 mmol/L (ref 22–32)
CREATININE: 1.03 mg/dL — AB (ref 0.44–1.00)
Calcium: 9.1 mg/dL (ref 8.9–10.3)
Chloride: 108 mmol/L (ref 101–111)
GFR calc Af Amer: 56 mL/min — ABNORMAL LOW (ref >60)
GFR calc non Af Amer: 49 mL/min — ABNORMAL LOW (ref >60)
GLUCOSE: 96 mg/dL (ref 65–99)
Potassium: 4.3 mmol/L (ref 3.5–5.1)
SODIUM: 139 mmol/L (ref 135–145)
TOTAL PROTEIN: 6.4 g/dL — AB (ref 6.5–8.1)

## 2016-06-21 MED ORDER — FENTANYL 75 MCG/HR TD PT72
75.0000 ug | MEDICATED_PATCH | TRANSDERMAL | 0 refills | Status: DC
Start: 2016-06-21 — End: 2016-08-07

## 2016-06-21 MED ORDER — DENOSUMAB 120 MG/1.7ML ~~LOC~~ SOLN
120.0000 mg | Freq: Once | SUBCUTANEOUS | Status: AC
Start: 2016-06-21 — End: 2016-06-21
  Administered 2016-06-21: 120 mg via SUBCUTANEOUS
  Filled 2016-06-21: qty 1.7

## 2016-06-21 NOTE — Progress Notes (Signed)
Patient here today for follow up.   

## 2016-06-21 NOTE — Assessment & Plan Note (Addendum)
Metastatic endometrial cancer to the bone- currently on Megace. Tolerating it well. PET- April 2018- Stable right pelvic mets/ right paratrach LN/ stable;  Currently s/p palliative RT to right hip- improved.  Continue Megace for now. No clinical progression noted.   # Pain - improved. on fentanyl patch to 75/ and percocet 10 every 8-12 hours. New prescriptions given.  # Presyncope x2 -vaso-vagal vs. Others. Recommend increased fluid intake. However   if this continues recommend follow up with PCP / Dr. Brigitte Pulse neurology. Again discussed the importance of avoiding falls especially being on blood thinners.  # Left lower extremity swelling chronic/DVT- continue Eliquis.  # ? Dementia- on aricept/ prevagen [non-estrogen] as per PCP; recent evaluation with Dr.Shah  # Metastatic lesions to the bone on X-geva- every 4 weeks. Tolerating well.  Calcium normal; no concerns for osteonecrosis of the jaw. Continue ca+ vit D.   # Port flushed every 8 weeks; discussed regarding explantation if port flush is a problem. As I do not anticipate chemotherapy for the patient for her cancer given multiple medical problems including dementia/frailty/high risk for side effects etc. Patient wants to talk to her surgeon Dr. Tamala Julian.   # labs/X-geva- 4 weeks; labs; 8 week; X-geva/ MD follow up- 8 weeks.  # Dr.Shah/ [facility].

## 2016-06-21 NOTE — Progress Notes (Signed)
[place] OFFICE PROGRESS NOTE  Patient Care Team: Idelle Crouch, MD as PCP - General (Unknown Physician Specialty) [name], MD as Referring Physician (Surgery) [name], MD as Consulting Physician (Urology)  Cancer of left breast [facility])   Staging form: Breast, AJCC 7th Edition     Clinical: Stage IA (T1b, N0, M0) - Signed by Forest Gleason, MD on 10/20/2014    Oncology History   . Patient has a previous history of endometrium carcinoma of endometrium stage IB disease status post bilateral salpingo-oophorectomy and radiation therapy 4. Right hydronephrosis detected on MRI scan off lumbosacral spine. Cystoscopy with attempted stent placement revealed bladder tumor in September of 2016 biopsies positive for recurrent endometrial cancer  5. Patient had stent placement in the right kidney Started radiation therapy 6. Patient has finished radiation therapy with relief in the pain (November, 2016) 7. Started on Megace in December of 2016 8 deep vein thrombosis in the left lower extremity (February 08, 2014) Hematuria due to xeralto IVC filter placement on February 11, 2015 She is off xeralto 9.condition recently had a cystoscopy no evidence of bleeding was found so patient has been started on ELOQUIS (March 15, 2015)  10. Patient again had a significant bleeding with hematuria with eloquis and has been discontinued; Patient is on Plavix. ON  Megace. AUG 2017- PET IMPROVED; mediastinal uptake- monitor for now.   # August 25 2015- LLE DVT- start Eliquis [stop plavix]; PET  FEB 2017-LEFT BREAST CA [9'0 clock- overlapping site] Estrogen receptor positive. Progesterone receptor positive. HER-2 receptor; Her 2neg; 5 mm tumor clinically stage IB N0 M0 tumor;s/p Lumpectomy; NO RT  # PET NOV 27th- STABLE right iliac lesions [increasing pain]/ right paratrac- refer to RT  # X-geva q 4 w  # MOLECULAR STUDIES: 08/23/15- MMR-STABLE.       Cancer of left breast ([place])   10/20/2014 Initial Diagnosis    Cancer of left breast       Endometrial cancer ([place])   11/18/2014 Initial Diagnosis    Endometrial cancer ([place])        INTERVAL HISTORY:  [name] 83 y.o.  female pleasant patient above history of Metastatic endometrial cancer currently on Megace- Is here for follow-up/ Accompanied by her husband.  Patient complains of at least 2 episodes of presyncope of unclear etiology. She has not fallen. Significant concerns of worsening dementia as per husband. No bleeding episodes especially as she is on Eliquis.  Patient continues to have right hip pain; currently stable on fentanyl patch. She is also needing to take breakthrough pain medication up to 2 times a day. No bleeding. Appetite is good. No weight loss.Marland Kitchen  REVIEW OF SYSTEMS:  A complete 10 point review of system is done which is negative except mentioned above/history of present illness.   PAST MEDICAL HISTORY :  Past Medical History:  Diagnosis Date  . Arthritis    Degenerative Disc Disease  . Bone cancer ([place])   . Breast cancer ([place]) 2016   LT LUMPECTOMY 03-2015  . Chronic kidney disease    HYDRONEPHROSIS  . Deep vein blood clot of left lower extremity ([place]) February 11, 2015   Left Leg  . Endometrial adenocarcinoma ([place]) 01/2012    stage Ib, grade 1, positive peritoneal cytology, no other risk factors or extra-uterine disease  . Gastric ulcer   . GERD (gastroesophageal reflux disease)   . Hearing loss   . Hiccups   . History of brachytherapy   .  History of colon polyps   . History of DVT (deep vein thrombosis)   . History of esophageal stricture   . History of shingles   . Inguinal lymphocyst   . Mild aphasia   . Pain    SEVERE CHRONIC RIGHT LEG,HIP,FOOT  . PE (pulmonary embolism)    H/O  . Stroke ([place]) 04/06/2011   residual:  aphasia    PAST SURGICAL HISTORY :   Past Surgical History:  Procedure Laterality Date  . ABDOMINAL HYSTERECTOMY   December 2013   history of cervical cancer  . BACK SURGERY     fusion of L3&4  . BREAST BIOPSY Left 10/13/2014   POS  . CATARACT EXTRACTION W/ INTRAOCULAR LENS IMPLANT     right  . CYSTOSCOPY W/ RETROGRADES Right 10/28/2014   Procedure: CYSTOSCOPY WITH RETROGRADE PYELOGRAM ATTEMPT, UNABLE TO PASS ;  Surgeon: [name], MD;  Location: [facility] ORS;  Service: Urology;  Laterality: Right;  . CYSTOSCOPY W/ RETROGRADES Right 09/15/2015   Procedure: CYSTOSCOPY WITH RETROGRADE PYELOGRAM;  Surgeon: [name], MD;  Location: [facility];  Service: Urology;  Laterality: Right;  . CYSTOSCOPY W/ URETERAL STENT PLACEMENT Right 03/10/2015   Procedure: CYSTOSCOPY WITH STENT REPLACEMENT;  Surgeon: [name], MD;  Location: [facility];  Service: Urology;  Laterality: Right;  . CYSTOSCOPY W/ URETERAL STENT PLACEMENT Right 06/09/2015   Procedure: CYSTOSCOPY WITH STENT REPLACEMENT;  Surgeon: [name], MD;  Location: [facility];  Service: Urology;  Laterality: Right;  . CYSTOSCOPY W/ URETERAL STENT PLACEMENT Right 09/15/2015   Procedure: CYSTOSCOPY WITH STENT REPLACEMENT;  Surgeon: [name], MD;  Location: [facility];  Service: Urology;  Laterality: Right;  . CYSTOSCOPY WITH STENT PLACEMENT Right 10/28/2014   Procedure: BLADDER BIOPSY WITH FULGERATION ;  Surgeon: [name], MD;  Location: [facility];  Service: Urology;  Laterality: Right;  . ESOPHAGEAL DILATION    . EYE SURGERY Bilateral    Cataract Extraction  . PARTIAL MASTECTOMY WITH NEEDLE LOCALIZATION Left 03/30/2015   Procedure: PARTIAL MASTECTOMY WITH NEEDLE LOCALIZATION;  Surgeon: [name], MD;  Location: [facility];  Service: General;  Laterality: Left;  . PERIPHERAL VASCULAR CATHETERIZATION N/A 02/11/2015   Procedure: IVC Filter Insertion;  Surgeon: Algernon Huxley, MD;  Location: [facility];  Service: Cardiovascular;  Laterality: N/A;  . PORTACATH PLACEMENT Right 11/26/2014   Procedure: INSERTION  PORT-A-CATH;  Surgeon: [name], MD;  Location: [facility];  Service: General;  Laterality: Right;  . SENTINEL NODE BIOPSY Left 03/30/2015   Procedure: SENTINEL NODE BIOPSY;  Surgeon: [name], MD;  Location: [facility];  Service: General;  Laterality: Left;  . thyroid removed    . THYROIDECTOMY, PARTIAL    . TONSILLECTOMY    . TOTAL ABDOMINAL HYSTERECTOMY W/ BILATERAL SALPINGOOPHORECTOMY Bilateral    staging biopsies    FAMILY HISTORY :   Family History  Problem Relation Age of Onset  . Heart attack Mother   . Breast cancer Maternal Aunt        x 2    SOCIAL HISTORY:   Social History  Substance Use Topics  . Smoking status: Never Smoker  . Smokeless tobacco: Never Used  . Alcohol use No    ALLERGIES:  is allergic to penicillins; acetaminophen; hydrocodone-acetaminophen; novocain [procaine]; and sulfa antibiotics.  MEDICATIONS:  Current Outpatient Prescriptions  Medication Sig Dispense Refill  . apixaban (ELIQUIS) 5 MG TABS tablet Take 1 tablet (5 mg total) by mouth  2 (two) times daily. 60 tablet 5  . clopidogrel (PLAVIX) 75 MG tablet Take 75 mg by mouth daily.    Marland Kitchen donepezil (ARICEPT) 5 MG tablet Take 5 mg by mouth at bedtime.    . fentaNYL (DURAGESIC - DOSED MCG/HR) 75 MCG/HR Place 1 patch (75 mcg total) onto the skin every 3 (three) days. 10 patch 0  . gabapentin (NEURONTIN) 300 MG capsule Take 1 capsule (300 mg total) by mouth 3 (three) times daily. 90 capsule 2  . megestrol (MEGACE) 40 MG tablet TAKE ONE TABLET BY MOUTH 4 TIMES DAILY 120 tablet 3  . meloxicam (MOBIC) 15 MG tablet Take 15 mg by mouth daily.    . Multiple Vitamins-Minerals (MULTIVITAMIN WITH MINERALS) tablet Take 1 tablet by mouth daily. Reported on 06/30/2015    . oxyCODONE-acetaminophen (PERCOCET) 10-325 MG tablet One pill three times a day as needed for pain 90 tablet 0  . VESICARE 10 MG tablet Take 1 tablet by mouth daily.     No current facility-administered medications for this  visit.     PHYSICAL EXAMINATION: ECOG PERFORMANCE STATUS: 1 - Symptomatic but completely ambulatory  BP 137/64 (BP Location: Left Arm, Patient Position: Sitting)   Pulse (!) 56   Temp 97.4 F (36.3 C) (Tympanic)   Resp 16   Wt 122 lb 6 oz (55.5 kg)   BMI 22.38 kg/m   Filed Weights   06/21/16 1441  Weight: 122 lb 6 oz (55.5 kg)    GENERAL: Frail-appearing Caucasian female patient walking herself Alert, no distress and comfortable. She Is accompanied by her husband; nephew. EYES: no pallor or icterus OROPHARYNX: no thrush or ulceration; good dentition  NECK: supple, no masses felt LYMPH:  no palpable lymphadenopathy in the cervical, axillary or inguinal regions LUNGS: clear to auscultation and  No wheeze or crackles HEART/CVS: regular rate & rhythm and no murmurs; Left lower extremity swollen compared to the right.  ABDOMEN:abdomen soft, non-tender and normal bowel sounds Musculoskeletal:no cyanosis of digits and no clubbing  PSYCH: alert & oriented x 3 with fluent speech NEURO: no focal motor/sensory deficits SKIN:  no rashes or significant lesions  LABORATORY DATA:  I have reviewed the data as listed    Component Value Date/Time   NA 139 06/21/2016 1400   NA 139 11/04/2013 1257   K 4.3 06/21/2016 1400   K 4.3 03/05/2014 1156   CL 108 06/21/2016 1400   CL 106 11/04/2013 1257   CO2 26 06/21/2016 1400   CO2 26 11/04/2013 1257   GLUCOSE 96 06/21/2016 1400   GLUCOSE 96 11/04/2013 1257   BUN 24 (H) 06/21/2016 1400   BUN 13 11/04/2013 1257   CREATININE 1.03 (H) 06/21/2016 1400   CREATININE 0.90 11/04/2013 1257   CALCIUM 9.1 06/21/2016 1400   CALCIUM 8.5 11/04/2013 1257   PROT 6.4 (L) 06/21/2016 1400   PROT 6.8 11/04/2013 1257   ALBUMIN 3.8 06/21/2016 1400   ALBUMIN 3.5 11/04/2013 1257   AST 19 06/21/2016 1400   AST 25 11/04/2013 1257   ALT 14 06/21/2016 1400   ALT 20 11/04/2013 1257   ALKPHOS 29 (L) 06/21/2016 1400   ALKPHOS 64 11/04/2013 1257   BILITOT 0.7  06/21/2016 1400   BILITOT 0.5 11/04/2013 1257   GFRNONAA 49 (L) 06/21/2016 1400   GFRNONAA >60 11/04/2013 1257   GFRNONAA >60 03/29/2012 1650   GFRAA 56 (L) 06/21/2016 1400   GFRAA >60 11/04/2013 1257   GFRAA >60 03/29/2012 1650    No  results found for: SPEP, UPEP  Lab Results  Component Value Date   WBC 3.9 06/21/2016   NEUTROABS 2.1 06/21/2016   HGB 13.8 06/21/2016   HCT 39.8 06/21/2016   MCV 93.7 06/21/2016   PLT 218 06/21/2016      Chemistry      Component Value Date/Time   NA 139 06/21/2016 1400   NA 139 11/04/2013 1257   K 4.3 06/21/2016 1400   K 4.3 03/05/2014 1156   CL 108 06/21/2016 1400   CL 106 11/04/2013 1257   CO2 26 06/21/2016 1400   CO2 26 11/04/2013 1257   BUN 24 (H) 06/21/2016 1400   BUN 13 11/04/2013 1257   CREATININE 1.03 (H) 06/21/2016 1400   CREATININE 0.90 11/04/2013 1257      Component Value Date/Time   CALCIUM 9.1 06/21/2016 1400   CALCIUM 8.5 11/04/2013 1257   ALKPHOS 29 (L) 06/21/2016 1400   ALKPHOS 64 11/04/2013 1257   AST 19 06/21/2016 1400   AST 25 11/04/2013 1257   ALT 14 06/21/2016 1400   ALT 20 11/04/2013 1257   BILITOT 0.7 06/21/2016 1400   BILITOT 0.5 11/04/2013 1257       RADIOGRAPHIC STUDIES: I have personally reviewed the radiological images as listed and agreed with the findings in the report. No results found.   ASSESSMENT & PLAN:  Endometrial cancer ([place]) Metastatic endometrial cancer to the bone- currently on Megace. Tolerating it well. PET- April 2018- Stable right pelvic mets/ right paratrach LN/ stable;  Currently s/p palliative RT to right hip- improved.  Continue Megace for now. No clinical progression noted.   # Pain - improved. on fentanyl patch to 75/ and percocet 10 every 8-12 hours. New prescriptions given.  # Presyncope x2 -vaso-vagal vs. Others. Recommend increased fluid intake. However   if this continues recommend follow up with PCP / Dr. Brigitte Pulse neurology. Again discussed the importance of avoiding  falls especially being on blood thinners.  # Left lower extremity swelling chronic/DVT- continue Eliquis.  # ? Dementia- on aricept/ prevagen [non-estrogen] as per PCP; recent evaluation with Dr.Shah  # Metastatic lesions to the bone on X-geva- every 4 weeks. Tolerating well.  Calcium normal; no concerns for osteonecrosis of the jaw. Continue ca+ vit D.   # Port flushed every 8 weeks; discussed regarding explantation if port flush is a problem. As I do not anticipate chemotherapy for the patient for her cancer given multiple medical problems including dementia/frailty/high risk for side effects etc. Patient wants to talk to her surgeon Dr. Tamala Julian.   # labs/X-geva- 4 weeks; labs; 8 week; X-geva/ MD follow up- 8 weeks.  # Dr.Shah/ [facility].   No orders of the defined types were placed in this encounter.     Cammie Sickle, MD 06/25/2016 6:47 PM

## 2016-07-12 ENCOUNTER — Other Ambulatory Visit: Payer: Self-pay | Admitting: *Deleted

## 2016-07-12 DIAGNOSIS — C541 Malignant neoplasm of endometrium: Secondary | ICD-10-CM

## 2016-07-12 MED ORDER — OXYCODONE-ACETAMINOPHEN 10-325 MG PO TABS: ORAL_TABLET | ORAL | 0 refills | Status: DC

## 2016-07-13 ENCOUNTER — Other Ambulatory Visit: Payer: Self-pay | Admitting: *Deleted

## 2016-07-13 DIAGNOSIS — M7989 Other specified soft tissue disorders: Secondary | ICD-10-CM

## 2016-07-13 DIAGNOSIS — M79662 Pain in left lower leg: Secondary | ICD-10-CM

## 2016-07-13 DIAGNOSIS — C541 Malignant neoplasm of endometrium: Secondary | ICD-10-CM

## 2016-07-13 MED ORDER — APIXABAN 5 MG PO TABS
5.0000 mg | ORAL_TABLET | Freq: Two times a day (BID) | ORAL | 0 refills | Status: DC
Start: 2016-07-13 — End: 2016-09-08

## 2016-07-18 ENCOUNTER — Telehealth: Payer: Self-pay | Admitting: *Deleted

## 2016-07-18 NOTE — Telephone Encounter (Signed)
Advised Mr Carollo to take her to ED for evaluation to see if she has a fracture after discussing with Dr Janese Banks. He was in agreement with this plan

## 2016-07-18 NOTE — Telephone Encounter (Signed)
Asking for an appt to be seen today. She is in severe pain in her right hip where the "cancer has eaten through the bone". Asking if we can give her a shot or something for the pain. She is currently on Fentanyl and Oxycodone Please advise

## 2016-07-19 ENCOUNTER — Inpatient Hospital Stay: Payer: Medicare Other | Attending: Internal Medicine

## 2016-07-19 ENCOUNTER — Inpatient Hospital Stay: Payer: Medicare Other

## 2016-07-19 DIAGNOSIS — C7911 Secondary malignant neoplasm of bladder: Secondary | ICD-10-CM | POA: Diagnosis not present

## 2016-07-19 DIAGNOSIS — C541 Malignant neoplasm of endometrium: Secondary | ICD-10-CM | POA: Diagnosis present

## 2016-07-19 DIAGNOSIS — Z923 Personal history of irradiation: Secondary | ICD-10-CM | POA: Insufficient documentation

## 2016-07-19 DIAGNOSIS — Z8541 Personal history of malignant neoplasm of cervix uteri: Secondary | ICD-10-CM | POA: Diagnosis not present

## 2016-07-19 DIAGNOSIS — C7951 Secondary malignant neoplasm of bone: Secondary | ICD-10-CM | POA: Diagnosis not present

## 2016-07-19 DIAGNOSIS — Z9221 Personal history of antineoplastic chemotherapy: Secondary | ICD-10-CM | POA: Insufficient documentation

## 2016-07-19 DIAGNOSIS — Z170 Estrogen receptor positive status [ER+]: Secondary | ICD-10-CM | POA: Diagnosis not present

## 2016-07-19 DIAGNOSIS — Z90710 Acquired absence of both cervix and uterus: Secondary | ICD-10-CM | POA: Diagnosis not present

## 2016-07-19 DIAGNOSIS — C50812 Malignant neoplasm of overlapping sites of left female breast: Secondary | ICD-10-CM | POA: Insufficient documentation

## 2016-07-19 DIAGNOSIS — Z90722 Acquired absence of ovaries, bilateral: Secondary | ICD-10-CM | POA: Insufficient documentation

## 2016-07-19 LAB — CBC WITH DIFFERENTIAL/PLATELET
Basophils Absolute: 0.2 10*3/uL — ABNORMAL HIGH (ref 0–0.1)
Basophils Relative: 2 %
EOS PCT: 1 %
Eosinophils Absolute: 0.1 10*3/uL (ref 0–0.7)
HCT: 39.9 % (ref 35.0–47.0)
HEMOGLOBIN: 13.9 g/dL (ref 12.0–16.0)
LYMPHS ABS: 0.6 10*3/uL — AB (ref 1.0–3.6)
Lymphocytes Relative: 9 %
MCH: 32.7 pg (ref 26.0–34.0)
MCHC: 34.9 g/dL (ref 32.0–36.0)
MCV: 93.7 fL (ref 80.0–100.0)
Monocytes Absolute: 0.6 10*3/uL (ref 0.2–0.9)
Monocytes Relative: 9 %
Neutro Abs: 5 10*3/uL (ref 1.4–6.5)
Neutrophils Relative %: 79 %
Platelets: 228 10*3/uL (ref 150–440)
RBC: 4.26 MIL/uL (ref 3.80–5.20)
RDW: 13.1 % (ref 11.5–14.5)
WBC: 6.4 10*3/uL (ref 3.6–11.0)

## 2016-07-19 LAB — COMPREHENSIVE METABOLIC PANEL
ALK PHOS: 38 U/L (ref 38–126)
ALT: 28 U/L (ref 14–54)
AST: 26 U/L (ref 15–41)
Albumin: 3.6 g/dL (ref 3.5–5.0)
Anion gap: 8 (ref 5–15)
BUN: 30 mg/dL — ABNORMAL HIGH (ref 6–20)
CALCIUM: 9.1 mg/dL (ref 8.9–10.3)
CO2: 24 mmol/L (ref 22–32)
CREATININE: 1.21 mg/dL — AB (ref 0.44–1.00)
Chloride: 106 mmol/L (ref 101–111)
GFR, EST AFRICAN AMERICAN: 46 mL/min — AB (ref >60)
GFR, EST NON AFRICAN AMERICAN: 40 mL/min — AB (ref >60)
Glucose, Bld: 109 mg/dL — ABNORMAL HIGH (ref 65–99)
Potassium: 4.4 mmol/L (ref 3.5–5.1)
Sodium: 138 mmol/L (ref 135–145)
Total Bilirubin: 0.7 mg/dL (ref 0.3–1.2)
Total Protein: 7.1 g/dL (ref 6.5–8.1)

## 2016-07-19 MED ORDER — DENOSUMAB 120 MG/1.7ML ~~LOC~~ SOLN
120.0000 mg | Freq: Once | SUBCUTANEOUS | Status: AC
  Administered 2016-07-19: 120 mg via SUBCUTANEOUS
  Filled 2016-07-19: qty 1.7

## 2016-07-19 NOTE — Telephone Encounter (Signed)
I personally reached out to patient's husband this morning to f/u on the patient's triage call yesterday. Husband reports improvement in pt's pain. "it has now subsided. She took pain medication and things started improving. This is why I didn't take her to the emergency room." pt's husband stated "I appreciate you following up with me. I have already talked to someone else this morning too in follow-up for this concern. She is doing better now and taking her oxycodone and using the fentanyl patch. I see improvement, so I will have her keep her lab and injection apt this afternoon. If anything changes, I will let you know."

## 2016-07-21 ENCOUNTER — Emergency Department
Admission: EM | Admit: 2016-07-21 | Discharge: 2016-07-21 | Disposition: A | Discharge status: Home / Self Care | Payer: Medicare Other | Attending: Emergency Medicine | Admitting: Emergency Medicine

## 2016-07-21 ENCOUNTER — Emergency Department: Payer: Medicare Other

## 2016-07-21 ENCOUNTER — Encounter: Payer: Self-pay | Admitting: Medical Oncology

## 2016-07-21 DIAGNOSIS — C541 Malignant neoplasm of endometrium: Secondary | ICD-10-CM | POA: Diagnosis not present

## 2016-07-21 DIAGNOSIS — C7951 Secondary malignant neoplasm of bone: Secondary | ICD-10-CM | POA: Insufficient documentation

## 2016-07-21 DIAGNOSIS — C50912 Malignant neoplasm of unspecified site of left female breast: Secondary | ICD-10-CM | POA: Insufficient documentation

## 2016-07-21 DIAGNOSIS — M25551 Pain in right hip: Secondary | ICD-10-CM | POA: Insufficient documentation

## 2016-07-21 DIAGNOSIS — E039 Hypothyroidism, unspecified: Secondary | ICD-10-CM | POA: Insufficient documentation

## 2016-07-21 DIAGNOSIS — R102 Pelvic and perineal pain: Secondary | ICD-10-CM

## 2016-07-21 NOTE — ED Triage Notes (Signed)
Pt has hx of metastatic uterine cancer that has went into pelvis and for past 2 weeks has been having rt hip pain. Denies injury.

## 2016-07-21 NOTE — ED Provider Notes (Signed)
[facility] Emergency Department Provider Note       Time seen: ----------------------------------------- 8:49 AM on 07/21/2016 -----------------------------------------     I have reviewed the triage vital signs and the nursing notes.   HISTORY   Chief Complaint Hip Pain    HPI Traci [name] is a 83 y.o. female who presents to the ED for right hip pain. Patient has history of metastatic uterine cancer and reportedly had metastases to her pelvis. She has had pain in her right hip for the last 2 weeks. She can ambulate, she denies fevers, chills, numbness or particular weakness. She denies any recent falls or injury. Pain is 6 out of 10 in the right hip   Past Medical History:  Diagnosis Date  . Arthritis    Degenerative Disc Disease  . Bone cancer ([place])   . Breast cancer ([place]) 2016   LT LUMPECTOMY 03-2015  . Chronic kidney disease    HYDRONEPHROSIS  . Deep vein blood clot of left lower extremity ([place]) February 11, 2015   Left Leg  . Endometrial adenocarcinoma ([place]) 01/2012    stage Ib, grade 1, positive peritoneal cytology, no other risk factors or extra-uterine disease  . Gastric ulcer   . GERD (gastroesophageal reflux disease)   . Hearing loss   . Hiccups   . History of brachytherapy   . History of colon polyps   . History of DVT (deep vein thrombosis)   . History of esophageal stricture   . History of shingles   . Inguinal lymphocyst   . Mild aphasia   . Pain    SEVERE CHRONIC RIGHT LEG,HIP,FOOT  . PE (pulmonary embolism)    H/O  . Stroke [facility]) 04/06/2011   residual:  aphasia    Patient Active Problem List   Diagnosis Date Noted  . Cancer, metastatic to bone ([place]) 06/21/2016  . Pain and swelling of left lower leg 08/25/2015  . Seizure disorder ([place]) 04/06/2015  . Personal history of urinary infection 03/15/2015  . Cerebrovascular accident (CVA) ([place]) 02/18/2015  . Endometrial cancer ([place]) 11/18/2014  . Degeneration of  intervertebral disc of lumbosacral region 11/14/2014  . Overflow incontinence 11/14/2014  . Pure hypercholesterolemia 11/14/2014  . Acquired hypothyroidism 10/20/2014  . DDD (degenerative disc disease), lumbosacral 10/20/2014  . Benign neoplasm of colon 10/20/2014  . Acid reflux 10/20/2014  . Esophageal stenosis 10/20/2014  . Bloodgood disease 10/20/2014  . History of colon polyps 10/20/2014  . H/O gastric ulcer 10/20/2014  . Healed or old pulmonary embolism 10/20/2014  . History of urinary anomaly 10/20/2014  . BP (high blood pressure) 10/20/2014  . Hypersomnia with sleep apnea 10/20/2014  . Multinodular goiter 10/20/2014  . Angina pectoris ([place]) 10/20/2014  . Seizure ([place]) 10/20/2014  . Digestive symptom 10/20/2014  . Incontinence overflow, urine 10/20/2014  . Dupuytren's contracture of foot 10/20/2014  . Hypercholesterolemia without hypertriglyceridemia 10/20/2014  . Cancer of left breast ([place]) 10/20/2014  . History of endometrial cancer 09/30/2014  . Lumbar radiculopathy 09/04/2014  . Deep vein thrombosis ([place]) 07/15/2013  . Deep vein thrombosis (DVT) ([place]) 07/15/2013  . Stroke [facility]) 04/07/2011    Past Surgical History:  Procedure Laterality Date  . ABDOMINAL HYSTERECTOMY  December 2013   history of cervical cancer  . BACK SURGERY     fusion of L3&4  . BREAST BIOPSY Left 10/13/2014   POS  . CATARACT EXTRACTION W/ INTRAOCULAR LENS IMPLANT     right  . CYSTOSCOPY W/ RETROGRADES Right 10/28/2014  Procedure: CYSTOSCOPY WITH RETROGRADE PYELOGRAM ATTEMPT, UNABLE TO PASS ;  Surgeon: [name], MD;  Location: [facility];  Service: Urology;  Laterality: Right;  . CYSTOSCOPY W/ RETROGRADES Right 09/15/2015   Procedure: CYSTOSCOPY WITH RETROGRADE PYELOGRAM;  Surgeon: [name], MD;  Location: [facility];  Service: Urology;  Laterality: Right;  . CYSTOSCOPY W/ URETERAL STENT PLACEMENT Right 03/10/2015   Procedure: CYSTOSCOPY WITH STENT REPLACEMENT;  Surgeon: [name], MD;  Location: [facility];  Service: Urology;  Laterality: Right;  . CYSTOSCOPY W/ URETERAL STENT PLACEMENT Right 06/09/2015   Procedure: CYSTOSCOPY WITH STENT REPLACEMENT;  Surgeon: [name], MD;  Location: [facility];  Service: Urology;  Laterality: Right;  . CYSTOSCOPY W/ URETERAL STENT PLACEMENT Right 09/15/2015   Procedure: CYSTOSCOPY WITH STENT REPLACEMENT;  Surgeon: [name], MD;  Location: [facility];  Service: Urology;  Laterality: Right;  . CYSTOSCOPY WITH STENT PLACEMENT Right 10/28/2014   Procedure: BLADDER BIOPSY WITH FULGERATION ;  Surgeon: [name], MD;  Location: [facility];  Service: Urology;  Laterality: Right;  . ESOPHAGEAL DILATION    . EYE SURGERY Bilateral    Cataract Extraction  . PARTIAL MASTECTOMY WITH NEEDLE LOCALIZATION Left 03/30/2015   Procedure: PARTIAL MASTECTOMY WITH NEEDLE LOCALIZATION;  Surgeon: [name], MD;  Location: [facility];  Service: General;  Laterality: Left;  . PERIPHERAL VASCULAR CATHETERIZATION N/A 02/11/2015   Procedure: IVC Filter Insertion;  Surgeon: Algernon Huxley, MD;  Location: [facility];  Service: Cardiovascular;  Laterality: N/A;  . PORTACATH PLACEMENT Right 11/26/2014   Procedure: INSERTION PORT-A-CATH;  Surgeon: [name], MD;  Location: [facility];  Service: General;  Laterality: Right;  . SENTINEL NODE BIOPSY Left 03/30/2015   Procedure: SENTINEL NODE BIOPSY;  Surgeon: [name], MD;  Location: [facility];  Service: General;  Laterality: Left;  . thyroid removed    . THYROIDECTOMY, PARTIAL    . TONSILLECTOMY    . TOTAL ABDOMINAL HYSTERECTOMY W/ BILATERAL SALPINGOOPHORECTOMY Bilateral    staging biopsies    Allergies Penicillins; Acetaminophen; Hydrocodone-acetaminophen; Novocain [procaine]; and Sulfa antibiotics  Social History Social History  Substance Use Topics  . Smoking status: Never Smoker  . Smokeless tobacco: Never Used  . Alcohol use No    Review of  Systems Constitutional: Negative for fever. Cardiovascular: Negative for chest pain. Respiratory: Negative for shortness of breath. Gastrointestinal: Negative for abdominal pain Musculoskeletal: Positive for right hip pain Skin: Negative for rash. Neurological: Negative for headaches, focal weakness or numbness.  All systems negative/normal/unremarkable except as stated in the HPI  ____________________________________________   PHYSICAL EXAM:  VITAL SIGNS: ED Triage Vitals  Enc Vitals Group     BP 07/21/16 0847 135/67     Pulse Rate 07/21/16 0847 84     Resp 07/21/16 0847 19     Temp 07/21/16 0847 98.4 F (36.9 C)     Temp Source 07/21/16 0847 Oral     SpO2 07/21/16 0847 97 %     Weight 07/21/16 0845 118 lb (53.5 kg)     Height 07/21/16 0845 5' (1.524 m)     Head Circumference --      Peak Flow --      Pain Score 07/21/16 0844 6     Pain Loc --      Pain Edu? --      Excl. in [place]? --     Constitutional: Well appearing and in no distress. Eyes: Conjunctivae are normal.  Normal extraocular movements. Cardiovascular: Normal rate, regular rhythm. No murmurs, rubs, or gallops. Respiratory: Normal respiratory effort without tachypnea nor retractions. Breath sounds are clear and equal bilaterally. No wheezes/rales/rhonchi. Gastrointestinal: Soft and nontender. Normal bowel sounds Musculoskeletal: No lower extremity tenderness nor edema.Normal range of motion of the right hip, mild right hip tenderness Neurologic:  Some expressive aphasia is noted No gross focal neurologic deficits are appreciated.  Skin:  Skin is warm, dry and intact. No rash noted. Psychiatric: Mood and affect are normal. Speech and behavior are normal.  ____________________________________________  ED COURSE:  Pertinent labs & imaging results that were available during my care of the patient were reviewed by me and considered in my medical decision making (see chart for details). Patient presents for right  hip pain, we will assess with labs and imaging as indicated.   Procedures ____________________________________________   RADIOLOGY Images were viewed by me  Right hip x-rays/CT hip IMPRESSION: No acute abnormality.  Chronic lytic lesion in associated soft tissue mass in the right ilium is unchanged since the most recent examination. ____________________________________________  FINAL ASSESSMENT AND PLAN  Right hip pain  Plan: Patient's imaging was dictated as above. Patient had presented for right hip pain of uncertain etiology. She does have a chronic lesion in the right ilium with no associated fracture. She'll be discharged with pain medicine and outpatient follow-up.   Earleen Newport, MD   Note: This note was generated in part or whole with voice recognition software. Voice recognition is usually quite accurate but there are transcription errors that can and very often do occur. I apologize for any typographical errors that were not detected and corrected.     Earleen Newport, MD 07/21/16 1125

## 2016-08-07 ENCOUNTER — Other Ambulatory Visit: Payer: Self-pay | Admitting: *Deleted

## 2016-08-07 DIAGNOSIS — C541 Malignant neoplasm of endometrium: Secondary | ICD-10-CM

## 2016-08-07 MED ORDER — FENTANYL 75 MCG/HR TD PT72: 75.0000 ug | MEDICATED_PATCH | TRANSDERMAL | 0 refills | Status: DC

## 2016-08-07 NOTE — Telephone Encounter (Signed)
Called to request a new prescription for Fentanyl.

## 2016-08-14 ENCOUNTER — Emergency Department: Payer: Medicare Other

## 2016-08-14 ENCOUNTER — Telehealth: Payer: Self-pay | Admitting: *Deleted

## 2016-08-14 ENCOUNTER — Encounter: Payer: Self-pay | Admitting: Emergency Medicine

## 2016-08-14 ENCOUNTER — Emergency Department
Admission: EM | Admit: 2016-08-14 | Discharge: 2016-08-14 | Disposition: A | Discharge status: Home / Self Care | Payer: Medicare Other | Attending: Student in an Organized Health Care Education/Training Program | Admitting: Student in an Organized Health Care Education/Training Program

## 2016-08-14 DIAGNOSIS — Z8542 Personal history of malignant neoplasm of other parts of uterus: Secondary | ICD-10-CM | POA: Diagnosis not present

## 2016-08-14 DIAGNOSIS — N3000 Acute cystitis without hematuria: Secondary | ICD-10-CM | POA: Diagnosis not present

## 2016-08-14 DIAGNOSIS — M25551 Pain in right hip: Secondary | ICD-10-CM

## 2016-08-14 DIAGNOSIS — Y998 Other external cause status: Secondary | ICD-10-CM | POA: Diagnosis not present

## 2016-08-14 DIAGNOSIS — Z79899 Other long term (current) drug therapy: Secondary | ICD-10-CM | POA: Insufficient documentation

## 2016-08-14 DIAGNOSIS — W010XXA Fall on same level from slipping, tripping and stumbling without subsequent striking against object, initial encounter: Secondary | ICD-10-CM | POA: Insufficient documentation

## 2016-08-14 DIAGNOSIS — Z510 Encounter for antineoplastic radiation therapy: Secondary | ICD-10-CM | POA: Insufficient documentation

## 2016-08-14 DIAGNOSIS — Z7901 Long term (current) use of anticoagulants: Secondary | ICD-10-CM | POA: Insufficient documentation

## 2016-08-14 DIAGNOSIS — Y929 Unspecified place or not applicable: Secondary | ICD-10-CM | POA: Insufficient documentation

## 2016-08-14 DIAGNOSIS — I639 Cerebral infarction, unspecified: Secondary | ICD-10-CM | POA: Insufficient documentation

## 2016-08-14 DIAGNOSIS — R1031 Right lower quadrant pain: Secondary | ICD-10-CM | POA: Diagnosis present

## 2016-08-14 DIAGNOSIS — Y939 Activity, unspecified: Secondary | ICD-10-CM | POA: Diagnosis not present

## 2016-08-14 LAB — COMPREHENSIVE METABOLIC PANEL
ALBUMIN: 3.1 g/dL — AB (ref 3.5–5.0)
ALT: 103 U/L — ABNORMAL HIGH (ref 14–54)
ANION GAP: 9 (ref 5–15)
AST: 84 U/L — AB (ref 15–41)
Alkaline Phosphatase: 70 U/L (ref 38–126)
BUN: 27 mg/dL — AB (ref 6–20)
CHLORIDE: 108 mmol/L (ref 101–111)
CO2: 23 mmol/L (ref 22–32)
Calcium: 8.7 mg/dL — ABNORMAL LOW (ref 8.9–10.3)
Creatinine, Ser: 1.12 mg/dL — ABNORMAL HIGH (ref 0.44–1.00)
GFR calc Af Amer: 51 mL/min — ABNORMAL LOW (ref >60)
GFR calc non Af Amer: 44 mL/min — ABNORMAL LOW (ref >60)
GLUCOSE: 100 mg/dL — AB (ref 65–99)
POTASSIUM: 3.8 mmol/L (ref 3.5–5.1)
SODIUM: 140 mmol/L (ref 135–145)
TOTAL PROTEIN: 7 g/dL (ref 6.5–8.1)
Total Bilirubin: 0.8 mg/dL (ref 0.3–1.2)

## 2016-08-14 LAB — CBC
HCT: 36.3 % (ref 35.0–47.0)
HEMOGLOBIN: 12.4 g/dL (ref 12.0–16.0)
MCH: 32 pg (ref 26.0–34.0)
MCHC: 34 g/dL (ref 32.0–36.0)
MCV: 94.1 fL (ref 80.0–100.0)
Platelets: 239 10*3/uL (ref 150–440)
RBC: 3.86 MIL/uL (ref 3.80–5.20)
RDW: 14.6 % — ABNORMAL HIGH (ref 11.5–14.5)
WBC: 5.4 10*3/uL (ref 3.6–11.0)

## 2016-08-14 LAB — URINALYSIS, COMPLETE (UACMP) WITH MICROSCOPIC
Bilirubin Urine: NEGATIVE
GLUCOSE, UA: NEGATIVE mg/dL
Hgb urine dipstick: NEGATIVE
KETONES UR: NEGATIVE mg/dL
Nitrite: POSITIVE — AB
PROTEIN: 30 mg/dL — AB
Specific Gravity, Urine: 1.024 (ref 1.005–1.030)
pH: 5 (ref 5.0–8.0)

## 2016-08-14 LAB — LIPASE, BLOOD: LIPASE: 22 U/L (ref 11–51)

## 2016-08-14 MED ORDER — FOSFOMYCIN TROMETHAMINE 3 G PO PACK
3.0000 g | PACK | Freq: Once | ORAL | Status: AC
  Administered 2016-08-14: 3 g via ORAL
  Filled 2016-08-14: qty 3

## 2016-08-14 NOTE — ED Provider Notes (Signed)
[facility] Emergency Department Provider Note    First MD Initiated Contact with Patient 08/14/16 1041     (approximate)  I have reviewed the triage vital signs and the nursing notes.   HISTORY  Chief Complaint Abdominal Pain    HPI Traci [name] is a 83 y.o. female presents with chief complaint of right lower abdominal pain and right hip pain that occurred after the patient had a fall roughly 1 week ago. Over the past week has been walking on it but is having worsening pain.  No fevers. Does have a history of uterine cancer and is undergoing radiation therapy and also has a known lytic lesion associated pelvis.  States the pain is mild-to-moderate, no radiation.   Past Medical History:  Diagnosis Date  . Arthritis    Degenerative Disc Disease  . Bone cancer ([place])   . Breast cancer ([place]) 2016   LT LUMPECTOMY 03-2015  . Chronic kidney disease    HYDRONEPHROSIS  . Deep vein blood clot of left lower extremity ([place]) February 11, 2015   Left Leg  . Endometrial adenocarcinoma ([place]) 01/2012    stage Ib, grade 1, positive peritoneal cytology, no other risk factors or extra-uterine disease  . Gastric ulcer   . GERD (gastroesophageal reflux disease)   . Hearing loss   . Hiccups   . History of brachytherapy   . History of colon polyps   . History of DVT (deep vein thrombosis)   . History of esophageal stricture   . History of shingles   . Inguinal lymphocyst   . Mild aphasia   . Pain    SEVERE CHRONIC RIGHT LEG,HIP,FOOT  . PE (pulmonary embolism)    H/O  . Stroke [facility]) 04/06/2011   residual:  aphasia   Family History  Problem Relation Age of Onset  . Heart attack Mother   . Breast cancer Maternal Aunt        x 2   Past Surgical History:  Procedure Laterality Date  . ABDOMINAL HYSTERECTOMY  December 2013   history of cervical cancer  . BACK SURGERY     fusion of L3&4  . BREAST BIOPSY Left 10/13/2014   POS  . CATARACT EXTRACTION W/  INTRAOCULAR LENS IMPLANT     right  . CYSTOSCOPY W/ RETROGRADES Right 10/28/2014   Procedure: CYSTOSCOPY WITH RETROGRADE PYELOGRAM ATTEMPT, UNABLE TO PASS ;  Surgeon: [name], MD;  Location: [facility] ORS;  Service: Urology;  Laterality: Right;  . CYSTOSCOPY W/ RETROGRADES Right 09/15/2015   Procedure: CYSTOSCOPY WITH RETROGRADE PYELOGRAM;  Surgeon: [name], MD;  Location: [facility];  Service: Urology;  Laterality: Right;  . CYSTOSCOPY W/ URETERAL STENT PLACEMENT Right 03/10/2015   Procedure: CYSTOSCOPY WITH STENT REPLACEMENT;  Surgeon: [name], MD;  Location: [facility];  Service: Urology;  Laterality: Right;  . CYSTOSCOPY W/ URETERAL STENT PLACEMENT Right 06/09/2015   Procedure: CYSTOSCOPY WITH STENT REPLACEMENT;  Surgeon: [name], MD;  Location: [facility];  Service: Urology;  Laterality: Right;  . CYSTOSCOPY W/ URETERAL STENT PLACEMENT Right 09/15/2015   Procedure: CYSTOSCOPY WITH STENT REPLACEMENT;  Surgeon: [name], MD;  Location: [facility];  Service: Urology;  Laterality: Right;  . CYSTOSCOPY WITH STENT PLACEMENT Right 10/28/2014   Procedure: BLADDER BIOPSY WITH FULGERATION ;  Surgeon: [name], MD;  Location: [facility];  Service: Urology;  Laterality: Right;  . ESOPHAGEAL DILATION    . EYE SURGERY Bilateral  Cataract Extraction  . PARTIAL MASTECTOMY WITH NEEDLE LOCALIZATION Left 03/30/2015   Procedure: PARTIAL MASTECTOMY WITH NEEDLE LOCALIZATION;  Surgeon: [name], MD;  Location: [facility];  Service: General;  Laterality: Left;  . PERIPHERAL VASCULAR CATHETERIZATION N/A 02/11/2015   Procedure: IVC Filter Insertion;  Surgeon: Algernon Huxley, MD;  Location: [facility];  Service: Cardiovascular;  Laterality: N/A;  . PORTACATH PLACEMENT Right 11/26/2014   Procedure: INSERTION PORT-A-CATH;  Surgeon: [name], MD;  Location: [facility];  Service: General;  Laterality: Right;  . SENTINEL NODE BIOPSY Left 03/30/2015    Procedure: SENTINEL NODE BIOPSY;  Surgeon: [name], MD;  Location: [facility];  Service: General;  Laterality: Left;  . thyroid removed    . THYROIDECTOMY, PARTIAL    . TONSILLECTOMY    . TOTAL ABDOMINAL HYSTERECTOMY W/ BILATERAL SALPINGOOPHORECTOMY Bilateral    staging biopsies   Patient Active Problem List   Diagnosis Date Noted  . Cancer, metastatic to bone ([place]) 06/21/2016  . Pain and swelling of left lower leg 08/25/2015  . Seizure disorder ([place]) 04/06/2015  . Personal history of urinary infection 03/15/2015  . Cerebrovascular accident (CVA) ([place]) 02/18/2015  . Endometrial cancer ([place]) 11/18/2014  . Degeneration of intervertebral disc of lumbosacral region 11/14/2014  . Overflow incontinence 11/14/2014  . Pure hypercholesterolemia 11/14/2014  . Acquired hypothyroidism 10/20/2014  . DDD (degenerative disc disease), lumbosacral 10/20/2014  . Benign neoplasm of colon 10/20/2014  . Acid reflux 10/20/2014  . Esophageal stenosis 10/20/2014  . Bloodgood disease 10/20/2014  . History of colon polyps 10/20/2014  . H/O gastric ulcer 10/20/2014  . Healed or old pulmonary embolism 10/20/2014  . History of urinary anomaly 10/20/2014  . BP (high blood pressure) 10/20/2014  . Hypersomnia with sleep apnea 10/20/2014  . Multinodular goiter 10/20/2014  . Angina pectoris ([place]) 10/20/2014  . Seizure ([place]) 10/20/2014  . Digestive symptom 10/20/2014  . Incontinence overflow, urine 10/20/2014  . Dupuytren's contracture of foot 10/20/2014  . Hypercholesterolemia without hypertriglyceridemia 10/20/2014  . Cancer of left breast ([place]) 10/20/2014  . History of endometrial cancer 09/30/2014  . Lumbar radiculopathy 09/04/2014  . Deep vein thrombosis ([place]) 07/15/2013  . Deep vein thrombosis (DVT) ([place]) 07/15/2013  . Stroke ([place]) 04/07/2011      Prior to Admission medications   Medication Sig Start Date End Date Taking? Authorizing Provider  apixaban (ELIQUIS) 5 MG TABS tablet Take 1  tablet (5 mg total) by mouth 2 (two) times daily. 07/13/16   Cammie Sickle, MD  fentaNYL (DURAGESIC - DOSED MCG/HR) 75 MCG/HR Place 1 patch (75 mcg total) onto the skin every 3 (three) days. 08/07/16   Cammie Sickle, MD  gabapentin (NEURONTIN) 300 MG capsule Take 1 capsule (300 mg total) by mouth 3 (three) times daily. 01/11/16   Cammie Sickle, MD  megestrol (MEGACE) 40 MG tablet TAKE ONE TABLET BY MOUTH 4 TIMES DAILY 06/02/16   Cammie Sickle, MD  Multiple Vitamins-Minerals (MULTIVITAMIN WITH MINERALS) tablet Take 1 tablet by mouth daily. Reported on 06/30/2015    [provider]  oxyCODONE-acetaminophen (PERCOCET) 10-325 MG tablet One pill three times a day as needed for pain 07/12/16   Lloyd Huger, MD    Allergies Penicillins; Acetaminophen; Hydrocodone-acetaminophen; Novocain [procaine]; and Sulfa antibiotics    Social History Social History  Substance Use Topics  . Smoking status: Never Smoker  . Smokeless tobacco: Never Used  . Alcohol use No    Review of Systems Patient  denies headaches, rhinorrhea, blurry vision, numbness, shortness of breath, chest pain, edema, cough, abdominal pain, nausea, vomiting, diarrhea, dysuria, fevers, rashes or hallucinations unless otherwise stated above in HPI. ____________________________________________   PHYSICAL EXAM:  VITAL SIGNS: Vitals:   08/14/16 1430 08/14/16 1500  BP: 128/72 (!) 128/56  Pulse: 76 67  Resp: 18 17  Temp:      Constitutional: Alert and oriented. Well appearing and in no acute distress. Eyes: Conjunctivae are normal.  Head: Atraumatic. Nose: No congestion/rhinnorhea. Mouth/Throat: Mucous membranes are moist.   Neck: No stridor. Painless ROM.  Cardiovascular: Normal rate, regular rhythm. Grossly normal heart sounds.  Good peripheral circulation. Respiratory: Normal respiratory effort.  No retractions. Lungs CTAB. Gastrointestinal: Soft and nontender. No distention. No  abdominal bruits. No CVA tenderness. Musculoskeletal: + right hip ttp. No lower extremity tenderness nor edema.  No joint effusions. Neurologic:  Normal speech and language. No gross focal neurologic deficits are appreciated. No facial droop Skin:  Skin is warm, dry and intact. No rash noted. Psychiatric: Mood and affect are normal. Speech and behavior are normal. ____________________________________________   LABS (all labs ordered are listed, but only abnormal results are displayed)  Results for orders placed or performed during the hospital encounter of 08/14/16 (from the past 24 hour(s))  Lipase, blood     Status: None   Collection Time: 08/14/16 10:41 AM  Result Value Ref Range   Lipase 22 11 - 51 U/L  Comprehensive metabolic panel     Status: Abnormal   Collection Time: 08/14/16 10:41 AM  Result Value Ref Range   Sodium 140 135 - 145 mmol/L   Potassium 3.8 3.5 - 5.1 mmol/L   Chloride 108 101 - 111 mmol/L   CO2 23 22 - 32 mmol/L   Glucose, Bld 100 (H) 65 - 99 mg/dL   BUN 27 (H) 6 - 20 mg/dL   Creatinine, Ser 1.12 (H) 0.44 - 1.00 mg/dL   Calcium 8.7 (L) 8.9 - 10.3 mg/dL   Total Protein 7.0 6.5 - 8.1 g/dL   Albumin 3.1 (L) 3.5 - 5.0 g/dL   AST 84 (H) 15 - 41 U/L   ALT 103 (H) 14 - 54 U/L   Alkaline Phosphatase 70 38 - 126 U/L   Total Bilirubin 0.8 0.3 - 1.2 mg/dL   GFR calc non Af Amer 44 (L) >60 mL/min   GFR calc Af Amer 51 (L) >60 mL/min   Anion gap 9 5 - 15  CBC     Status: Abnormal   Collection Time: 08/14/16 10:41 AM  Result Value Ref Range   WBC 5.4 3.6 - 11.0 K/uL   RBC 3.86 3.80 - 5.20 MIL/uL   Hemoglobin 12.4 12.0 - 16.0 g/dL   HCT 36.3 35.0 - 47.0 %   MCV 94.1 80.0 - 100.0 fL   MCH 32.0 26.0 - 34.0 pg   MCHC 34.0 32.0 - 36.0 g/dL   RDW 14.6 (H) 11.5 - 14.5 %   Platelets 239 150 - 440 K/uL  Urinalysis, Complete w Microscopic     Status: Abnormal   Collection Time: 08/14/16 11:58 AM  Result Value Ref Range   Color, Urine AMBER (A) YELLOW   APPearance HAZY  (A) CLEAR   Specific Gravity, Urine 1.024 1.005 - 1.030   pH 5.0 5.0 - 8.0   Glucose, UA NEGATIVE NEGATIVE mg/dL   Hgb urine dipstick NEGATIVE NEGATIVE   Bilirubin Urine NEGATIVE NEGATIVE   Ketones, ur NEGATIVE NEGATIVE mg/dL   Protein, ur  30 (A) NEGATIVE mg/dL   Nitrite POSITIVE (A) NEGATIVE   Leukocytes, UA TRACE (A) NEGATIVE   RBC / HPF 0-5 0 - 5 RBC/hpf   WBC, UA 6-30 0 - 5 WBC/hpf   Bacteria, UA MANY (A) NONE SEEN   Squamous Epithelial / LPF 0-5 (A) NONE SEEN   Mucous PRESENT    ____________________________________________ ____________________________________________  RADIOLOGY  I personally reviewed all radiographic images ordered to evaluate for the above acute complaints and reviewed radiology reports and findings.  These findings were personally discussed with the patient.  Please see medical record for radiology report.  ____________________________________________   PROCEDURES  Procedure(s) performed:  Procedures    Critical Care performed: no ____________________________________________   INITIAL IMPRESSION / ASSESSMENT AND PLAN / ED COURSE  Pertinent labs & imaging results that were available during my care of the patient were reviewed by me and considered in my medical decision making (see chart for details).  DDX: fracture,dislocation, lytic lesion,  mass  Traci [name] is a 83 y.o. who presents to the ED with acute right hip pain as described above after fall. Patient without any other focal deficits. Abdominal exam is soft and benign. X-ray ordered to evaluate for fracture shows no acute abnormality. Based on the patient's history of lytic lesion CT imaging of the pelvis was ordered to evaluate for any nondisplaced fracture that would be causing the patient's pain. CT imaging without acute abnormality. The patient's blood work is otherwise reassuring. She does have evidence of UTI for which she will receive a dose fosfomycin. Have discussed with the  patient and available family all diagnostics and treatments performed thus far and all questions were answered to the best of my ability. The patient demonstrates understanding and agreement with plan.      ____________________________________________   FINAL CLINICAL IMPRESSION(S) / ED DIAGNOSES  Final diagnoses:  Acute right hip pain  Acute cystitis without hematuria      NEW MEDICATIONS STARTED DURING THIS VISIT:  New Prescriptions   No medications on file     Note:  This document was prepared using Dragon voice recognition software and may include unintentional dictation errors.    Merlyn Lot, MD 08/14/16 1539

## 2016-08-14 NOTE — ED Notes (Signed)
REport to Gwinda Passe, RN

## 2016-08-14 NOTE — ED Triage Notes (Addendum)
Patient presents to the ED with right lower abdominal pain and one episode of vomiting on Friday.  Patient's husband states patient fell over a week ago and since then has had increased pain.  Patient called her cancer doctor this morning who advised that patient come to the ED to be evaluated.  Patient's husband states that patient has a cancer that has, "eaten away at her pelvic floor, and that's the area she fell on."  Patient is alert and oriented at this time.  Skin is warm and dry.  Respirations are even and not labored.

## 2016-08-14 NOTE — ED Notes (Signed)
Patient transported to CT 

## 2016-08-14 NOTE — ED Notes (Signed)
Resumed care from ashley rn.  Pt alert.  Family with pt.   

## 2016-08-14 NOTE — Telephone Encounter (Signed)
Patient's husband called. Left msg with receptionist to have RN call back.  Pt fell this morning.  I contacted pt and spoke to pt and her husband. Pt sustained a fall this morning. C/o of "severe Bilateral hip pain" MD would like pt to go to ER to further evaluate trauma r/t to fall. Pt is also on Eliquis. Pt advised to go to ED. Husband stated that he would take her.

## 2016-08-14 NOTE — Telephone Encounter (Signed)
Already spoke to pt and pt's husband - at 74 am. As per chart - Pt was advised to go to ED. Pt currently in ED.

## 2016-08-14 NOTE — Telephone Encounter (Signed)
Received a call from husband stating patient fell and she is bruised in her ankles. Attempted to return call to ask him to take her to ER. I had to leave a message for him

## 2016-08-17 LAB — URINE CULTURE

## 2016-08-18 NOTE — Progress Notes (Signed)
PHARMACY CONSULT NOTE - INITIAL   Pharmacy Consult for ED Culture Results   Allergies  Allergen Reactions  . Penicillins Rash    .Has patient had a PCN reaction causing immediate rash, facial/tongue/throat swelling, SOB or lightheadedness with hypotension: No Has patient had a PCN reaction causing severe rash involving mucus membranes or skin necrosis: No Has patient had a PCN reaction that required hospitalization: No Has patient had a PCN reaction occurring within the last 10 years: Unknown If all of the above answers are "NO", then may proceed with Cephalosporin use.   . Acetaminophen     Other reaction(s): Unknown  . Hydrocodone-Acetaminophen Nausea Only  . Novocain [Procaine] Other (See Comments) and Nausea And Vomiting    Other reaction(s): Unknown Chest pain  . Sulfa Antibiotics Other (See Comments)    CHEST PAIN    Patient Measurements: Height: 5' (152.4 cm) Weight: 118 lb (53.5 kg) IBW/kg (Calculated) : 45.5   Microbiology: Recent Results (from the past 720 hour(s))  Urine culture     Status: Abnormal   Collection Time: 08/14/16 11:58 AM  Result Value Ref Range Status   Specimen Description URINE, RANDOM  Final   Special Requests NONE  Final   Culture >=100,000 COLONIES/mL ESCHERICHIA COLI (A)  Final   Report Status 08/17/2016 FINAL  Final   Organism ID, Bacteria ESCHERICHIA COLI (A)  Final      Susceptibility   Escherichia coli - MIC*    AMPICILLIN 8 SENSITIVE Sensitive     CEFAZOLIN <=4 SENSITIVE Sensitive     CEFTRIAXONE <=1 SENSITIVE Sensitive     CIPROFLOXACIN <=0.25 SENSITIVE Sensitive     GENTAMICIN <=1 SENSITIVE Sensitive     IMIPENEM <=0.25 SENSITIVE Sensitive     NITROFURANTOIN <=16 SENSITIVE Sensitive     TRIMETH/SULFA <=20 SENSITIVE Sensitive     AMPICILLIN/SULBACTAM 4 SENSITIVE Sensitive     PIP/TAZO <=4 SENSITIVE Sensitive     Extended ESBL NEGATIVE Sensitive     * >=100,000 COLONIES/mL ESCHERICHIA COLI   Assessment: 83 yo female seen in  ED for hip pain secondary to fall on 7/9. Patient also diagnosed with UTI. Ucx now showing >100,000 E. Coli pansensitive.    Plan:  Review of patient's chart indicated patient received fosfomycin 3g x 1 dose in ED.   No further treatment is needed at this time.   Pernell Dupre, PharmD, BCPS Clinical Pharmacist 08/18/2016 1:26 PM

## 2016-08-23 ENCOUNTER — Inpatient Hospital Stay: Payer: Medicare Other

## 2016-08-23 ENCOUNTER — Inpatient Hospital Stay: Payer: Medicare Other | Attending: Internal Medicine | Admitting: Internal Medicine

## 2016-08-23 VITALS — BP 137/67 | HR 63 | Temp 99.1°F | Resp 18 | Ht 60.0 in | Wt 111.6 lb

## 2016-08-23 DIAGNOSIS — Z803 Family history of malignant neoplasm of breast: Secondary | ICD-10-CM | POA: Insufficient documentation

## 2016-08-23 DIAGNOSIS — Z95828 Presence of other vascular implants and grafts: Secondary | ICD-10-CM

## 2016-08-23 DIAGNOSIS — C541 Malignant neoplasm of endometrium: Secondary | ICD-10-CM

## 2016-08-23 DIAGNOSIS — C7951 Secondary malignant neoplasm of bone: Secondary | ICD-10-CM | POA: Insufficient documentation

## 2016-08-23 DIAGNOSIS — Z79899 Other long term (current) drug therapy: Secondary | ICD-10-CM | POA: Insufficient documentation

## 2016-08-23 DIAGNOSIS — Z8673 Personal history of transient ischemic attack (TIA), and cerebral infarction without residual deficits: Secondary | ICD-10-CM | POA: Diagnosis not present

## 2016-08-23 DIAGNOSIS — F0390 Unspecified dementia without behavioral disturbance: Secondary | ICD-10-CM | POA: Diagnosis not present

## 2016-08-23 DIAGNOSIS — Z170 Estrogen receptor positive status [ER+]: Secondary | ICD-10-CM | POA: Insufficient documentation

## 2016-08-23 DIAGNOSIS — Z8541 Personal history of malignant neoplasm of cervix uteri: Secondary | ICD-10-CM | POA: Insufficient documentation

## 2016-08-23 DIAGNOSIS — C50812 Malignant neoplasm of overlapping sites of left female breast: Secondary | ICD-10-CM | POA: Diagnosis not present

## 2016-08-23 DIAGNOSIS — R2681 Unsteadiness on feet: Secondary | ICD-10-CM | POA: Insufficient documentation

## 2016-08-23 DIAGNOSIS — Z90722 Acquired absence of ovaries, bilateral: Secondary | ICD-10-CM | POA: Diagnosis not present

## 2016-08-23 DIAGNOSIS — Z8619 Personal history of other infectious and parasitic diseases: Secondary | ICD-10-CM | POA: Insufficient documentation

## 2016-08-23 DIAGNOSIS — Z923 Personal history of irradiation: Secondary | ICD-10-CM | POA: Diagnosis not present

## 2016-08-23 DIAGNOSIS — Z86010 Personal history of colonic polyps: Secondary | ICD-10-CM | POA: Diagnosis not present

## 2016-08-23 DIAGNOSIS — Z880 Allergy status to penicillin: Secondary | ICD-10-CM | POA: Diagnosis not present

## 2016-08-23 DIAGNOSIS — N189 Chronic kidney disease, unspecified: Secondary | ICD-10-CM | POA: Diagnosis not present

## 2016-08-23 DIAGNOSIS — C7911 Secondary malignant neoplasm of bladder: Secondary | ICD-10-CM | POA: Diagnosis not present

## 2016-08-23 DIAGNOSIS — Z90710 Acquired absence of both cervix and uterus: Secondary | ICD-10-CM | POA: Diagnosis not present

## 2016-08-23 DIAGNOSIS — Z8719 Personal history of other diseases of the digestive system: Secondary | ICD-10-CM | POA: Insufficient documentation

## 2016-08-23 DIAGNOSIS — Z86711 Personal history of pulmonary embolism: Secondary | ICD-10-CM | POA: Diagnosis not present

## 2016-08-23 DIAGNOSIS — Z7901 Long term (current) use of anticoagulants: Secondary | ICD-10-CM | POA: Diagnosis not present

## 2016-08-23 DIAGNOSIS — Z9221 Personal history of antineoplastic chemotherapy: Secondary | ICD-10-CM | POA: Insufficient documentation

## 2016-08-23 DIAGNOSIS — K219 Gastro-esophageal reflux disease without esophagitis: Secondary | ICD-10-CM | POA: Diagnosis not present

## 2016-08-23 DIAGNOSIS — Z86718 Personal history of other venous thrombosis and embolism: Secondary | ICD-10-CM | POA: Insufficient documentation

## 2016-08-23 LAB — COMPREHENSIVE METABOLIC PANEL
ALBUMIN: 3.2 g/dL — AB (ref 3.5–5.0)
ALK PHOS: 50 U/L (ref 38–126)
ALT: 29 U/L (ref 14–54)
ANION GAP: 4 — AB (ref 5–15)
AST: 25 U/L (ref 15–41)
BILIRUBIN TOTAL: 0.5 mg/dL (ref 0.3–1.2)
BUN: 28 mg/dL — ABNORMAL HIGH (ref 6–20)
CALCIUM: 8.8 mg/dL — AB (ref 8.9–10.3)
CO2: 26 mmol/L (ref 22–32)
Chloride: 105 mmol/L (ref 101–111)
Creatinine, Ser: 1.07 mg/dL — ABNORMAL HIGH (ref 0.44–1.00)
GFR calc Af Amer: 54 mL/min — ABNORMAL LOW (ref >60)
GFR, EST NON AFRICAN AMERICAN: 46 mL/min — AB (ref >60)
GLUCOSE: 112 mg/dL — AB (ref 65–99)
Potassium: 4 mmol/L (ref 3.5–5.1)
Sodium: 135 mmol/L (ref 135–145)
TOTAL PROTEIN: 6.9 g/dL (ref 6.5–8.1)

## 2016-08-23 LAB — CBC WITH DIFFERENTIAL/PLATELET
BASOS PCT: 1 %
Basophils Absolute: 0.1 10*3/uL (ref 0–0.1)
Eosinophils Absolute: 0.2 10*3/uL (ref 0–0.7)
Eosinophils Relative: 4 %
HEMATOCRIT: 35.1 % (ref 35.0–47.0)
HEMOGLOBIN: 12.3 g/dL (ref 12.0–16.0)
LYMPHS ABS: 1.3 10*3/uL (ref 1.0–3.6)
LYMPHS PCT: 31 %
MCH: 32.2 pg (ref 26.0–34.0)
MCHC: 35 g/dL (ref 32.0–36.0)
MCV: 92 fL (ref 80.0–100.0)
MONO ABS: 0.4 10*3/uL (ref 0.2–0.9)
MONOS PCT: 10 %
NEUTROS ABS: 2.2 10*3/uL (ref 1.4–6.5)
NEUTROS PCT: 54 %
Platelets: 432 10*3/uL (ref 150–440)
RBC: 3.82 MIL/uL (ref 3.80–5.20)
RDW: 14.3 % (ref 11.5–14.5)
WBC: 4.1 10*3/uL (ref 3.6–11.0)

## 2016-08-23 MED ORDER — HEPARIN SOD (PORK) LOCK FLUSH 100 UNIT/ML IV SOLN
500.0000 [IU] | Freq: Once | INTRAVENOUS | Status: AC
  Administered 2016-08-23: 500 [IU] via INTRAVENOUS

## 2016-08-23 MED ORDER — OXYCODONE-ACETAMINOPHEN 10-325 MG PO TABS: 1.0000 | ORAL_TABLET | Freq: Three times a day (TID) | ORAL | 0 refills | Status: DC | PRN

## 2016-08-23 MED ORDER — DENOSUMAB 120 MG/1.7ML ~~LOC~~ SOLN
120.0000 mg | Freq: Once | SUBCUTANEOUS | Status: AC
  Administered 2016-08-23: 120 mg via SUBCUTANEOUS
  Filled 2016-08-23: qty 1.7

## 2016-08-23 MED ORDER — SODIUM CHLORIDE 0.9% FLUSH
10.0000 mL | INTRAVENOUS | Status: DC | PRN
  Administered 2016-08-23: 10 mL via INTRAVENOUS
  Filled 2016-08-23: qty 10

## 2016-08-23 NOTE — Assessment & Plan Note (Addendum)
Metastatic endometrial cancer to the bone- currently on Megace. Tolerating it well. July 2018- abdomen pelvis CT scan stable right iliac lesion.  # Continue Megace at this time. Discussed the natural history of this disease. However patient seems to be holding steady.  # Pain - improved. on fentanyl patch to 75/ and percocet 10 every 8-12 hours.   # Left lower extremity swelling chronic/DVT- continue Eliquis.  # ? Dementia- on aricept/ prevagen [non-estrogen] as per PCP; recent evaluation with Dr.Shah  # Metastatic lesions to the bone on X-geva- every 4 weeks. Tolerating well.  Calcium normal; no concerns for osteonecrosis of the jaw. Continue ca+ vit D.   # Gait instability/mechanical falls- recommended that patient use assistive devices. Patient reluctant.  # Port flushed every 8 weeks; continue the same.   # weight loss- no obvious progression of malignancy noted. Recommend referral to [place].   # labs/X-geva- 4 weeks; labs; MD 8 week; X-geva/ MD follow up- 8 weeks/port flush.

## 2016-08-23 NOTE — Progress Notes (Signed)
[facility] OFFICE PROGRESS NOTE  Patient Care Team: Idelle Crouch, MD as PCP - General (Unknown Physician Specialty) [name], MD as Referring Physician (Surgery) [name], MD as Consulting Physician (Urology)  Cancer of left breast [facility])   Staging form: Breast, AJCC 7th Edition     Clinical: Stage IA (T1b, N0, M0) - Signed by Forest Gleason, MD on 10/20/2014    Oncology History   . Patient has a previous history of endometrium carcinoma of endometrium stage IB disease status post bilateral salpingo-oophorectomy and radiation therapy 4. Right hydronephrosis detected on MRI scan off lumbosacral spine. Cystoscopy with attempted stent placement revealed bladder tumor in September of 2016 biopsies positive for recurrent endometrial cancer  5. Patient had stent placement in the right kidney Started radiation therapy 6. Patient has finished radiation therapy with relief in the pain (November, 2016) 7. Started on Megace in December of 2016 8 deep vein thrombosis in the left lower extremity (February 08, 2014) Hematuria due to xeralto IVC filter placement on February 11, 2015 She is off xeralto 9.condition recently had a cystoscopy no evidence of bleeding was found so patient has been started on ELOQUIS (March 15, 2015)  10. Patient again had a significant bleeding with hematuria with eloquis and has been discontinued; Patient is on Plavix. ON  Megace. AUG 2017- PET IMPROVED; mediastinal uptake- monitor for now.   # August 25 2015- LLE DVT- start Eliquis [stop plavix]; PET  FEB 2017-LEFT BREAST CA [9'0 clock- overlapping site] Estrogen receptor positive. Progesterone receptor positive. HER-2 receptor; Her 2neg; 5 mm tumor clinically stage IB N0 M0 tumor;s/p Lumpectomy; NO RT  # PET NOV 27th- STABLE right iliac lesions [increasing pain]/ right paratrac- refer to RT  # X-geva q 4 w  # MOLECULAR STUDIES: 08/23/15- MMR-STABLE.       Cancer of left breast ([place])   10/20/2014 Initial Diagnosis    Cancer of left breast       Endometrial cancer ([place])   11/18/2014 Initial Diagnosis    Endometrial cancer ([place])        INTERVAL HISTORY:  Traci [name] 83 y.o.  female pleasant patient above history of Metastatic endometrial cancer currently on Megace- Is here for follow-up/ Accompanied by her husband.  In the interim patient had been evaluated in the emergency room twice-after falls. Workup in the emergency room including CT scan- of the pelvis did not show any progression of the malignancy except for; stable right iliac lytic lesion.   Patient's husband is concerned the patient  gait is unsteady; but unfortunately she does not use her cane or walker. No bleeding episodes especially as she is on Eliquis.  Patient continues to have right hip pain; currently stable on fentanyl patch. She is also needing to take breakthrough pain medication up to 2 times a day. No bleeding. Appetite is good; she has lost about 10 pounds in the last 2 months. She is eager to go to the beach with the family.  REVIEW OF SYSTEMS:  A complete 10 point review of system is done which is negative except mentioned above/history of present illness.   PAST MEDICAL HISTORY :  Past Medical History:  Diagnosis Date  . Arthritis    Degenerative Disc Disease  . Bone cancer ([place])   . Breast cancer ([place]) 2016   LT LUMPECTOMY 03-2015  . Chronic kidney disease    HYDRONEPHROSIS  . Deep vein blood clot of left lower extremity ([place])  February 11, 2015   Left Leg  . Endometrial adenocarcinoma ([place]) 01/2012    stage Ib, grade 1, positive peritoneal cytology, no other risk factors or extra-uterine disease  . Gastric ulcer   . GERD (gastroesophageal reflux disease)   . Hearing loss   . Hiccups   . History of brachytherapy   . History of colon polyps   . History of DVT (deep vein thrombosis)   . History of esophageal stricture   . History of shingles    . Inguinal lymphocyst   . Mild aphasia   . Pain    SEVERE CHRONIC RIGHT LEG,HIP,FOOT  . PE (pulmonary embolism)    H/O  . Stroke ([place]) 04/06/2011   residual:  aphasia    PAST SURGICAL HISTORY :   Past Surgical History:  Procedure Laterality Date  . ABDOMINAL HYSTERECTOMY  December 2013   history of cervical cancer  . BACK SURGERY     fusion of L3&4  . BREAST BIOPSY Left 10/13/2014   POS  . CATARACT EXTRACTION W/ INTRAOCULAR LENS IMPLANT     right  . CYSTOSCOPY W/ RETROGRADES Right 10/28/2014   Procedure: CYSTOSCOPY WITH RETROGRADE PYELOGRAM ATTEMPT, UNABLE TO PASS ;  Surgeon: [name], MD;  Location: [facility];  Service: Urology;  Laterality: Right;  . CYSTOSCOPY W/ RETROGRADES Right 09/15/2015   Procedure: CYSTOSCOPY WITH RETROGRADE PYELOGRAM;  Surgeon: [name], MD;  Location: [facility];  Service: Urology;  Laterality: Right;  . CYSTOSCOPY W/ URETERAL STENT PLACEMENT Right 03/10/2015   Procedure: CYSTOSCOPY WITH STENT REPLACEMENT;  Surgeon: [name], MD;  Location: [facility];  Service: Urology;  Laterality: Right;  . CYSTOSCOPY W/ URETERAL STENT PLACEMENT Right 06/09/2015   Procedure: CYSTOSCOPY WITH STENT REPLACEMENT;  Surgeon: [name], MD;  Location: [facility];  Service: Urology;  Laterality: Right;  . CYSTOSCOPY W/ URETERAL STENT PLACEMENT Right 09/15/2015   Procedure: CYSTOSCOPY WITH STENT REPLACEMENT;  Surgeon: [name], MD;  Location: [facility];  Service: Urology;  Laterality: Right;  . CYSTOSCOPY WITH STENT PLACEMENT Right 10/28/2014   Procedure: BLADDER BIOPSY WITH FULGERATION ;  Surgeon: [name], MD;  Location: [facility];  Service: Urology;  Laterality: Right;  . ESOPHAGEAL DILATION    . EYE SURGERY Bilateral    Cataract Extraction  . PARTIAL MASTECTOMY WITH NEEDLE LOCALIZATION Left 03/30/2015   Procedure: PARTIAL MASTECTOMY WITH NEEDLE LOCALIZATION;  Surgeon: [name], MD;  Location: [facility];  Service: General;   Laterality: Left;  . PERIPHERAL VASCULAR CATHETERIZATION N/A 02/11/2015   Procedure: IVC Filter Insertion;  Surgeon: Algernon Huxley, MD;  Location: [facility];  Service: Cardiovascular;  Laterality: N/A;  . PORTACATH PLACEMENT Right 11/26/2014   Procedure: INSERTION PORT-A-CATH;  Surgeon: [name], MD;  Location: [facility];  Service: General;  Laterality: Right;  . SENTINEL NODE BIOPSY Left 03/30/2015   Procedure: SENTINEL NODE BIOPSY;  Surgeon: [name], MD;  Location: [facility];  Service: General;  Laterality: Left;  . thyroid removed    . THYROIDECTOMY, PARTIAL    . TONSILLECTOMY    . TOTAL ABDOMINAL HYSTERECTOMY W/ BILATERAL SALPINGOOPHORECTOMY Bilateral    staging biopsies    FAMILY HISTORY :   Family History  Problem Relation Age of Onset  . Heart attack Mother   . Breast cancer Maternal Aunt        x 2    SOCIAL HISTORY:   Social History  Substance Use Topics  .  Smoking status: Never Smoker  . Smokeless tobacco: Never Used  . Alcohol use No    ALLERGIES:  is allergic to penicillins; acetaminophen; hydrocodone-acetaminophen; novocain [procaine]; and sulfa antibiotics.  MEDICATIONS:  Current Outpatient Prescriptions  Medication Sig Dispense Refill  . apixaban (ELIQUIS) 5 MG TABS tablet Take 1 tablet (5 mg total) by mouth 2 (two) times daily. 60 tablet 0  . fentaNYL (DURAGESIC - DOSED MCG/HR) 75 MCG/HR Place 1 patch (75 mcg total) onto the skin every 3 (three) days. 10 patch 0  . gabapentin (NEURONTIN) 300 MG capsule Take 1 capsule (300 mg total) by mouth 3 (three) times daily. 90 capsule 2  . megestrol (MEGACE) 40 MG tablet Take 40 mg by mouth 4 (four) times daily.    . Multiple Vitamins-Minerals (CENTRUM SILVER) tablet Take 1 tablet by mouth daily.    Marland Kitchen oxyCODONE-acetaminophen (PERCOCET) 10-325 MG tablet Take 1 tablet by mouth 3 (three) times daily as needed for pain. 90 tablet 0   No current facility-administered medications for this visit.      PHYSICAL EXAMINATION: ECOG PERFORMANCE STATUS: 1 - Symptomatic but completely ambulatory  BP 137/67 (BP Location: Left Arm, Patient Position: Sitting)   Pulse 63   Temp 99.1 F (37.3 C) (Tympanic)   Resp 18   Ht 5' (1.524 m)   Wt 111 lb 9.6 oz (50.6 kg)   BMI 21.80 kg/m   Filed Weights   08/23/16 1518  Weight: 111 lb 9.6 oz (50.6 kg)    GENERAL: [place] Caucasian female patient walking herself Alert, no distress and comfortable. She Is accompanied by her husband; nephew. EYES: no pallor or icterus OROPHARYNX: no thrush or ulceration; good dentition  NECK: supple, no masses felt LYMPH:  no palpable lymphadenopathy in the cervical, axillary or inguinal regions LUNGS: clear to auscultation and  No wheeze or crackles HEART/CVS: regular rate & rhythm and no murmurs; Left lower extremity swollen compared to the right.  ABDOMEN:abdomen soft, non-tender and normal bowel sounds Musculoskeletal:no cyanosis of digits and no clubbing  PSYCH: alert & oriented x 3 with fluent speech NEURO: no focal motor/sensory deficits SKIN:  no rashes or significant lesions  LABORATORY DATA:  I have reviewed the data as listed    Component Value Date/Time   NA 135 08/23/2016 1431   NA 139 11/04/2013 1257   K 4.0 08/23/2016 1431   K 4.3 03/05/2014 1156   CL 105 08/23/2016 1431   CL 106 11/04/2013 1257   CO2 26 08/23/2016 1431   CO2 26 11/04/2013 1257   GLUCOSE 112 (H) 08/23/2016 1431   GLUCOSE 96 11/04/2013 1257   BUN 28 (H) 08/23/2016 1431   BUN 13 11/04/2013 1257   CREATININE 1.07 (H) 08/23/2016 1431   CREATININE 0.90 11/04/2013 1257   CALCIUM 8.8 (L) 08/23/2016 1431   CALCIUM 8.5 11/04/2013 1257   PROT 6.9 08/23/2016 1431   PROT 6.8 11/04/2013 1257   ALBUMIN 3.2 (L) 08/23/2016 1431   ALBUMIN 3.5 11/04/2013 1257   AST 25 08/23/2016 1431   AST 25 11/04/2013 1257   ALT 29 08/23/2016 1431   ALT 20 11/04/2013 1257   ALKPHOS 50 08/23/2016 1431   ALKPHOS 64 11/04/2013 1257    BILITOT 0.5 08/23/2016 1431   BILITOT 0.5 11/04/2013 1257   GFRNONAA 46 (L) 08/23/2016 1431   GFRNONAA >60 11/04/2013 1257   GFRNONAA >60 03/29/2012 1650   GFRAA 54 (L) 08/23/2016 1431   GFRAA >60 11/04/2013 1257   GFRAA >60 03/29/2012 1650  No results found for: SPEP, UPEP  Lab Results  Component Value Date   WBC 4.1 08/23/2016   NEUTROABS 2.2 08/23/2016   HGB 12.3 08/23/2016   HCT 35.1 08/23/2016   MCV 92.0 08/23/2016   PLT 432 08/23/2016      Chemistry      Component Value Date/Time   NA 135 08/23/2016 1431   NA 139 11/04/2013 1257   K 4.0 08/23/2016 1431   K 4.3 03/05/2014 1156   CL 105 08/23/2016 1431   CL 106 11/04/2013 1257   CO2 26 08/23/2016 1431   CO2 26 11/04/2013 1257   BUN 28 (H) 08/23/2016 1431   BUN 13 11/04/2013 1257   CREATININE 1.07 (H) 08/23/2016 1431   CREATININE 0.90 11/04/2013 1257      Component Value Date/Time   CALCIUM 8.8 (L) 08/23/2016 1431   CALCIUM 8.5 11/04/2013 1257   ALKPHOS 50 08/23/2016 1431   ALKPHOS 64 11/04/2013 1257   AST 25 08/23/2016 1431   AST 25 11/04/2013 1257   ALT 29 08/23/2016 1431   ALT 20 11/04/2013 1257   BILITOT 0.5 08/23/2016 1431   BILITOT 0.5 11/04/2013 1257       RADIOGRAPHIC STUDIES: I have personally reviewed the radiological images as listed and agreed with the findings in the report. No results found.   ASSESSMENT & PLAN:  Endometrial cancer ([place]) Metastatic endometrial cancer to the bone- currently on Megace. Tolerating it well. July 2018- abdomen pelvis CT scan stable right iliac lesion.  # Continue Megace at this time. Discussed the natural history of this disease. However patient seems to be holding steady.  # Pain - improved. on fentanyl patch to 75/ and percocet 10 every 8-12 hours.   # Left lower extremity swelling chronic/DVT- continue Eliquis.  # ? Dementia- on aricept/ prevagen [non-estrogen] as per PCP; recent evaluation with Dr.Shah  # Metastatic lesions to the bone on X-geva-  every 4 weeks. Tolerating well.  Calcium normal; no concerns for osteonecrosis of the jaw. Continue ca+ vit D.   # Gait instability/mechanical falls- recommended that patient use assistive devices. Patient reluctant.  # Port flushed every 8 weeks; continue the same.   # weight loss- no obvious progression of malignancy noted. Recommend referral to [place].   # labs/X-geva- 4 weeks; labs; MD 8 week; X-geva/ MD follow up- 8 weeks/port flush.   No orders of the defined types were placed in this encounter.     Cammie Sickle, MD 08/23/2016 5:25 PM

## 2016-08-28 ENCOUNTER — Other Ambulatory Visit: Payer: Self-pay | Admitting: Surgery

## 2016-08-28 DIAGNOSIS — Z853 Personal history of malignant neoplasm of breast: Secondary | ICD-10-CM

## 2016-09-07 ENCOUNTER — Inpatient Hospital Stay: Payer: Medicare Other | Attending: Internal Medicine

## 2016-09-07 DIAGNOSIS — R634 Abnormal weight loss: Secondary | ICD-10-CM | POA: Insufficient documentation

## 2016-09-07 DIAGNOSIS — Z8673 Personal history of transient ischemic attack (TIA), and cerebral infarction without residual deficits: Secondary | ICD-10-CM | POA: Insufficient documentation

## 2016-09-07 DIAGNOSIS — Z7901 Long term (current) use of anticoagulants: Secondary | ICD-10-CM | POA: Insufficient documentation

## 2016-09-07 DIAGNOSIS — Z7902 Long term (current) use of antithrombotics/antiplatelets: Secondary | ICD-10-CM | POA: Insufficient documentation

## 2016-09-07 DIAGNOSIS — Z79899 Other long term (current) drug therapy: Secondary | ICD-10-CM | POA: Insufficient documentation

## 2016-09-07 DIAGNOSIS — Z880 Allergy status to penicillin: Secondary | ICD-10-CM | POA: Insufficient documentation

## 2016-09-07 DIAGNOSIS — Z170 Estrogen receptor positive status [ER+]: Secondary | ICD-10-CM | POA: Insufficient documentation

## 2016-09-07 DIAGNOSIS — M25551 Pain in right hip: Secondary | ICD-10-CM | POA: Insufficient documentation

## 2016-09-07 DIAGNOSIS — C541 Malignant neoplasm of endometrium: Secondary | ICD-10-CM | POA: Insufficient documentation

## 2016-09-07 DIAGNOSIS — Z8541 Personal history of malignant neoplasm of cervix uteri: Secondary | ICD-10-CM | POA: Insufficient documentation

## 2016-09-07 DIAGNOSIS — Z86010 Personal history of colonic polyps: Secondary | ICD-10-CM | POA: Insufficient documentation

## 2016-09-07 DIAGNOSIS — F0390 Unspecified dementia without behavioral disturbance: Secondary | ICD-10-CM | POA: Insufficient documentation

## 2016-09-07 DIAGNOSIS — N189 Chronic kidney disease, unspecified: Secondary | ICD-10-CM | POA: Insufficient documentation

## 2016-09-07 DIAGNOSIS — C7911 Secondary malignant neoplasm of bladder: Secondary | ICD-10-CM | POA: Insufficient documentation

## 2016-09-07 DIAGNOSIS — Z86718 Personal history of other venous thrombosis and embolism: Secondary | ICD-10-CM | POA: Insufficient documentation

## 2016-09-07 DIAGNOSIS — C7951 Secondary malignant neoplasm of bone: Secondary | ICD-10-CM | POA: Insufficient documentation

## 2016-09-07 DIAGNOSIS — Z86711 Personal history of pulmonary embolism: Secondary | ICD-10-CM | POA: Insufficient documentation

## 2016-09-07 DIAGNOSIS — Z923 Personal history of irradiation: Secondary | ICD-10-CM | POA: Insufficient documentation

## 2016-09-07 DIAGNOSIS — Z90722 Acquired absence of ovaries, bilateral: Secondary | ICD-10-CM | POA: Insufficient documentation

## 2016-09-07 DIAGNOSIS — R600 Localized edema: Secondary | ICD-10-CM | POA: Insufficient documentation

## 2016-09-07 DIAGNOSIS — Z9221 Personal history of antineoplastic chemotherapy: Secondary | ICD-10-CM | POA: Insufficient documentation

## 2016-09-07 NOTE — Progress Notes (Signed)
Nutrition Assessment   Reason for Assessment:   MD referral for weight loss  ASSESSMENT:  83 year old female with left breast cancer and endometrial cancer in 2016.  Patient with metastatic disease and taking megace.  Past medical history of CKD, GERD, esophageal stricture, mild aphasia, stroke, dementia  Patient seen in clinic this pm with husband.  Husband reports that he does all the cooking.  Reports that her appetite is very small.  Ate about 1/4 of spaghetti and some of peach cobbler for dinner last night.  Patient could not really tell me what she is able to eat in a days time, think dementia is playing a role.  Husband reports that she drinks 2 boost per day (sounds like original version), patient reports drinks 1 per day.  Husband reports patient eats ice cream with fruit for bedtime snack.  No report of nausea. Asked about constipation or diarrhea and patient reports she goes when she needs to.  Could tell me if she has a bowel movement once per day or how often.    Nutrition Focused Physical Exam: deferred  Medications: megace, MVI, fentanyl  Labs: BUN 28, creatinine 1.07  Anthropometrics:   Height: 60 inches Weight: 111 lb 9.6 oz UBW: patient can't tell me.  Noted 5/16 122 lb 6 oz weight, 1 /15/18 128 lb BMI: 21   9% weight loss in the last 2 months, significant   Estimated Energy Needs  Kcals: 1500-1750 calories/d Protein: 75-88 g/d Fluid: 1.7 L/d  NUTRITION DIAGNOSIS: Inadequate oral intake related to poor appetite, ? Dementia as evidenced by 9% weight loss in the last 2 months   MALNUTRITION DIAGNOSIS: continue to monitor   INTERVENTION:   Discussed strategies to increase calories and protein with patient and husband.  FAct sheet given.   Encouraged patient to be drinking oral nutrition supplements with higher calories and protein.  Samples provided along with coupons and recipe book to help with increasing calories and protein. Encouraged small frequent  meals Contact information given to patient and husband and encouraged patient to call with questions  MONITORING, EVALUATION, GOAL: Patient will consume adequate calories and protein to prevent weight loss   NEXT VISIT: phone follow-up in 1-2 months  Breeanna Galgano B. Zenia Resides, [place], [place] Registered Dietitian [phone] (pager)

## 2016-09-08 ENCOUNTER — Other Ambulatory Visit: Payer: Self-pay | Admitting: *Deleted

## 2016-09-08 ENCOUNTER — Telehealth: Payer: Self-pay | Admitting: *Deleted

## 2016-09-08 DIAGNOSIS — M79662 Pain in left lower leg: Secondary | ICD-10-CM

## 2016-09-08 DIAGNOSIS — M7989 Other specified soft tissue disorders: Secondary | ICD-10-CM

## 2016-09-08 DIAGNOSIS — C541 Malignant neoplasm of endometrium: Secondary | ICD-10-CM

## 2016-09-08 MED ORDER — APIXABAN 5 MG PO TABS: 5.0000 mg | ORAL_TABLET | Freq: Two times a day (BID) | ORAL | 0 refills | Status: DC

## 2016-09-08 NOTE — Telephone Encounter (Signed)
Medication refilled per Dr. Burlene Arnt husband notified.

## 2016-09-20 ENCOUNTER — Inpatient Hospital Stay (HOSPITAL_BASED_OUTPATIENT_CLINIC_OR_DEPARTMENT_OTHER): Payer: Medicare Other | Admitting: Internal Medicine

## 2016-09-20 ENCOUNTER — Inpatient Hospital Stay: Payer: Medicare Other

## 2016-09-20 VITALS — BP 120/65 | HR 67 | Temp 99.8°F | Resp 18 | Ht 60.0 in | Wt 121.0 lb

## 2016-09-20 DIAGNOSIS — Z923 Personal history of irradiation: Secondary | ICD-10-CM | POA: Diagnosis not present

## 2016-09-20 DIAGNOSIS — N189 Chronic kidney disease, unspecified: Secondary | ICD-10-CM

## 2016-09-20 DIAGNOSIS — C7951 Secondary malignant neoplasm of bone: Secondary | ICD-10-CM

## 2016-09-20 DIAGNOSIS — F0390 Unspecified dementia without behavioral disturbance: Secondary | ICD-10-CM | POA: Diagnosis not present

## 2016-09-20 DIAGNOSIS — Z170 Estrogen receptor positive status [ER+]: Secondary | ICD-10-CM

## 2016-09-20 DIAGNOSIS — Z86711 Personal history of pulmonary embolism: Secondary | ICD-10-CM | POA: Diagnosis not present

## 2016-09-20 DIAGNOSIS — Z7901 Long term (current) use of anticoagulants: Secondary | ICD-10-CM | POA: Diagnosis not present

## 2016-09-20 DIAGNOSIS — Z880 Allergy status to penicillin: Secondary | ICD-10-CM

## 2016-09-20 DIAGNOSIS — Z9221 Personal history of antineoplastic chemotherapy: Secondary | ICD-10-CM | POA: Diagnosis not present

## 2016-09-20 DIAGNOSIS — Z8673 Personal history of transient ischemic attack (TIA), and cerebral infarction without residual deficits: Secondary | ICD-10-CM | POA: Diagnosis not present

## 2016-09-20 DIAGNOSIS — Z8541 Personal history of malignant neoplasm of cervix uteri: Secondary | ICD-10-CM

## 2016-09-20 DIAGNOSIS — C541 Malignant neoplasm of endometrium: Secondary | ICD-10-CM

## 2016-09-20 DIAGNOSIS — M25551 Pain in right hip: Secondary | ICD-10-CM | POA: Diagnosis not present

## 2016-09-20 DIAGNOSIS — Z90722 Acquired absence of ovaries, bilateral: Secondary | ICD-10-CM

## 2016-09-20 DIAGNOSIS — Z86718 Personal history of other venous thrombosis and embolism: Secondary | ICD-10-CM | POA: Diagnosis not present

## 2016-09-20 DIAGNOSIS — Z7902 Long term (current) use of antithrombotics/antiplatelets: Secondary | ICD-10-CM

## 2016-09-20 DIAGNOSIS — Z86010 Personal history of colonic polyps: Secondary | ICD-10-CM | POA: Diagnosis not present

## 2016-09-20 DIAGNOSIS — R634 Abnormal weight loss: Secondary | ICD-10-CM | POA: Diagnosis not present

## 2016-09-20 DIAGNOSIS — R600 Localized edema: Secondary | ICD-10-CM

## 2016-09-20 DIAGNOSIS — C7911 Secondary malignant neoplasm of bladder: Secondary | ICD-10-CM | POA: Diagnosis not present

## 2016-09-20 DIAGNOSIS — Z79899 Other long term (current) drug therapy: Secondary | ICD-10-CM | POA: Diagnosis not present

## 2016-09-20 LAB — CBC WITH DIFFERENTIAL/PLATELET
BASOS ABS: 0 10*3/uL (ref 0–0.1)
BASOS PCT: 1 %
Eosinophils Absolute: 0.2 10*3/uL (ref 0–0.7)
Eosinophils Relative: 4 %
HEMATOCRIT: 37.9 % (ref 35.0–47.0)
HEMOGLOBIN: 13.1 g/dL (ref 12.0–16.0)
Lymphocytes Relative: 25 %
Lymphs Abs: 1.2 10*3/uL (ref 1.0–3.6)
MCH: 32 pg (ref 26.0–34.0)
MCHC: 34.5 g/dL (ref 32.0–36.0)
MCV: 92.6 fL (ref 80.0–100.0)
MONOS PCT: 8 %
Monocytes Absolute: 0.4 10*3/uL (ref 0.2–0.9)
NEUTROS ABS: 2.9 10*3/uL (ref 1.4–6.5)
NEUTROS PCT: 62 %
Platelets: 243 10*3/uL (ref 150–440)
RBC: 4.09 MIL/uL (ref 3.80–5.20)
RDW: 15.3 % — ABNORMAL HIGH (ref 11.5–14.5)
WBC: 4.8 10*3/uL (ref 3.6–11.0)

## 2016-09-20 LAB — COMPREHENSIVE METABOLIC PANEL
ALBUMIN: 3.6 g/dL (ref 3.5–5.0)
ALK PHOS: 34 U/L — AB (ref 38–126)
ALT: 15 U/L (ref 14–54)
AST: 19 U/L (ref 15–41)
Anion gap: 9 (ref 5–15)
BILIRUBIN TOTAL: 0.7 mg/dL (ref 0.3–1.2)
BUN: 28 mg/dL — AB (ref 6–20)
CALCIUM: 8.9 mg/dL (ref 8.9–10.3)
CO2: 24 mmol/L (ref 22–32)
Chloride: 107 mmol/L (ref 101–111)
Creatinine, Ser: 1.14 mg/dL — ABNORMAL HIGH (ref 0.44–1.00)
GFR calc Af Amer: 50 mL/min — ABNORMAL LOW (ref >60)
GFR calc non Af Amer: 43 mL/min — ABNORMAL LOW (ref >60)
GLUCOSE: 98 mg/dL (ref 65–99)
Potassium: 3.9 mmol/L (ref 3.5–5.1)
Sodium: 140 mmol/L (ref 135–145)
TOTAL PROTEIN: 6.6 g/dL (ref 6.5–8.1)

## 2016-09-20 MED ORDER — FENTANYL 75 MCG/HR TD PT72: 75.0000 ug | MEDICATED_PATCH | TRANSDERMAL | 0 refills | Status: DC

## 2016-09-20 MED ORDER — DENOSUMAB 120 MG/1.7ML ~~LOC~~ SOLN
120.0000 mg | Freq: Once | SUBCUTANEOUS | Status: AC
  Administered 2016-09-20: 120 mg via SUBCUTANEOUS
  Filled 2016-09-20: qty 1.7

## 2016-09-20 MED ORDER — OXYCODONE-ACETAMINOPHEN 10-325 MG PO TABS
1.0000 | ORAL_TABLET | Freq: Three times a day (TID) | ORAL | 0 refills | Status: DC | PRN
Start: 2016-09-20 — End: 2016-10-18

## 2016-09-20 NOTE — Assessment & Plan Note (Addendum)
Metastatic endometrial cancer to the bone- currently on Megace. Tolerating it well. July 2018- abdomen pelvis CT scan stable right iliac lesion.  Continue Megace at this time. No clinical progression noted. Will plan to get scan in end of dec 2018.   # Pain- improved. on fentanyl patch to 75/ and percocet 10 every 8-12 hours. New scripts given.   # Left lower extremity swelling chronic/DVT- continue Eliquis.  # Metastatic lesions to the bone on X-geva- every 4 weeks. Tolerating well.  Calcium normal; no concerns for osteonecrosis of the jaw. Continue ca+ vit D.   # Port flushed every 8 weeks; continue the same.   # weight loss-- improved. S/p meeting with Jolie.   # labs/X-geva- 4 weeks.

## 2016-09-20 NOTE — Progress Notes (Signed)
[facility] OFFICE PROGRESS NOTE  Patient Care Team: Idelle Crouch, MD as PCP - General (Unknown Physician Specialty) [name], MD as Referring Physician (Surgery) [name], MD as Consulting Physician (Urology)  Cancer of left breast [facility])   Staging form: Breast, AJCC 7th Edition     Clinical: Stage IA (T1b, N0, M0) - Signed by Forest Gleason, MD on 10/20/2014    Oncology History   . Patient has a previous history of endometrium carcinoma of endometrium stage IB disease status post bilateral salpingo-oophorectomy and radiation therapy 4. Right hydronephrosis detected on MRI scan off lumbosacral spine. Cystoscopy with attempted stent placement revealed bladder tumor in September of 2016 biopsies positive for recurrent endometrial cancer  5. Patient had stent placement in the right kidney Started radiation therapy 6. Patient has finished radiation therapy with relief in the pain (November, 2016) 7. Started on Megace in December of 2016 8 deep vein thrombosis in the left lower extremity (February 08, 2014) Hematuria due to xeralto IVC filter placement on February 11, 2015 She is off xeralto 9.condition recently had a cystoscopy no evidence of bleeding was found so patient has been started on ELOQUIS (March 15, 2015)  10. Patient again had a significant bleeding with hematuria with eloquis and has been discontinued; Patient is on Plavix. ON  Megace. AUG 2017- PET IMPROVED; mediastinal uptake- monitor for now.   # August 25 2015- LLE DVT- start Eliquis [stop plavix]; PET  FEB 2017-LEFT BREAST CA [9'0 clock- overlapping site] Estrogen receptor positive. Progesterone receptor positive. HER-2 receptor; Her 2neg; 5 mm tumor clinically stage IB N0 M0 tumor;s/p Lumpectomy; NO RT  # PET NOV 27th- STABLE right iliac lesions [increasing pain]/ right paratrac- refer to RT  # X-geva q 4 w  # MOLECULAR STUDIES: 08/23/15- MMR-STABLE.       Cancer of left breast ([place])   10/20/2014 Initial Diagnosis    Cancer of left breast       Endometrial cancer ([place])   11/18/2014 Initial Diagnosis    Endometrial cancer ([place])        INTERVAL HISTORY:  Traci [name] 83 y.o.  female pleasant patient above history of Metastatic endometrial cancer currently on Megace- Is here for follow-up/ Accompanied by her husband.   in the interim patient  Had a  Good trip to the beach.  NO emergency room  Visits. No falls. No jaw pain.    Patient continues to have right hip pain; currently stable on fentanyl patch. She is also needing to take breakthrough pain medication up to 2 times a day. No bleeding. Appetite is good;weight loss improved/stable.   REVIEW OF SYSTEMS:  A complete 10 point review of system is done which is negative except mentioned above/history of present illness.   PAST MEDICAL HISTORY :  Past Medical History:  Diagnosis Date  . Arthritis    Degenerative Disc Disease  . Bone cancer ([place])   . Breast cancer ([place]) 2016   LT LUMPECTOMY 03-2015  . Chronic kidney disease    HYDRONEPHROSIS  . Deep vein blood clot of left lower extremity ([place]) February 11, 2015   Left Leg  . Endometrial adenocarcinoma ([place]) 01/2012    stage Ib, grade 1, positive peritoneal cytology, no other risk factors or extra-uterine disease  . Gastric ulcer   . GERD (gastroesophageal reflux disease)   . Hearing loss   . Hiccups   . History of brachytherapy   . History of colon  polyps   . History of DVT (deep vein thrombosis)   . History of esophageal stricture   . History of shingles   . Inguinal lymphocyst   . Mild aphasia   . Pain    SEVERE CHRONIC RIGHT LEG,HIP,FOOT  . PE (pulmonary embolism)    H/O  . Stroke ([place]) 04/06/2011   residual:  aphasia    PAST SURGICAL HISTORY :   Past Surgical History:  Procedure Laterality Date  . ABDOMINAL HYSTERECTOMY  December 2013   history of cervical cancer  . BACK SURGERY     fusion of L3&4  .  BREAST BIOPSY Left 10/13/2014   POS  . CATARACT EXTRACTION W/ INTRAOCULAR LENS IMPLANT     right  . CYSTOSCOPY W/ RETROGRADES Right 10/28/2014   Procedure: CYSTOSCOPY WITH RETROGRADE PYELOGRAM ATTEMPT, UNABLE TO PASS ;  Surgeon: [name], MD;  Location: [facility];  Service: Urology;  Laterality: Right;  . CYSTOSCOPY W/ RETROGRADES Right 09/15/2015   Procedure: CYSTOSCOPY WITH RETROGRADE PYELOGRAM;  Surgeon: [name], MD;  Location: [facility];  Service: Urology;  Laterality: Right;  . CYSTOSCOPY W/ URETERAL STENT PLACEMENT Right 03/10/2015   Procedure: CYSTOSCOPY WITH STENT REPLACEMENT;  Surgeon: [name], MD;  Location: [facility];  Service: Urology;  Laterality: Right;  . CYSTOSCOPY W/ URETERAL STENT PLACEMENT Right 06/09/2015   Procedure: CYSTOSCOPY WITH STENT REPLACEMENT;  Surgeon: [name], MD;  Location: [facility];  Service: Urology;  Laterality: Right;  . CYSTOSCOPY W/ URETERAL STENT PLACEMENT Right 09/15/2015   Procedure: CYSTOSCOPY WITH STENT REPLACEMENT;  Surgeon: [name], MD;  Location: [facility];  Service: Urology;  Laterality: Right;  . CYSTOSCOPY WITH STENT PLACEMENT Right 10/28/2014   Procedure: BLADDER BIOPSY WITH FULGERATION ;  Surgeon: [name], MD;  Location: [facility];  Service: Urology;  Laterality: Right;  . ESOPHAGEAL DILATION    . EYE SURGERY Bilateral    Cataract Extraction  . PARTIAL MASTECTOMY WITH NEEDLE LOCALIZATION Left 03/30/2015   Procedure: PARTIAL MASTECTOMY WITH NEEDLE LOCALIZATION;  Surgeon: [name], MD;  Location: [facility];  Service: General;  Laterality: Left;  . PERIPHERAL VASCULAR CATHETERIZATION N/A 02/11/2015   Procedure: IVC Filter Insertion;  Surgeon: Algernon Huxley, MD;  Location: [facility];  Service: Cardiovascular;  Laterality: N/A;  . PORTACATH PLACEMENT Right 11/26/2014   Procedure: INSERTION PORT-A-CATH;  Surgeon: [name], MD;  Location: [facility];  Service: General;   Laterality: Right;  . SENTINEL NODE BIOPSY Left 03/30/2015   Procedure: SENTINEL NODE BIOPSY;  Surgeon: [name], MD;  Location: [facility];  Service: General;  Laterality: Left;  . thyroid removed    . THYROIDECTOMY, PARTIAL    . TONSILLECTOMY    . TOTAL ABDOMINAL HYSTERECTOMY W/ BILATERAL SALPINGOOPHORECTOMY Bilateral    staging biopsies    FAMILY HISTORY :   Family History  Problem Relation Age of Onset  . Heart attack Mother   . Breast cancer Maternal Aunt        x 2    SOCIAL HISTORY:   Social History  Substance Use Topics  . Smoking status: Never Smoker  . Smokeless tobacco: Never Used  . Alcohol use No    ALLERGIES:  is allergic to penicillins; acetaminophen; hydrocodone-acetaminophen; novocain [procaine]; and sulfa antibiotics.  MEDICATIONS:  Current Outpatient Prescriptions  Medication Sig Dispense Refill  . apixaban (ELIQUIS) 5 MG TABS tablet Take 1 tablet (5 mg total) by mouth 2 (two) times  daily. 60 tablet 0  . fentaNYL (DURAGESIC - DOSED MCG/HR) 75 MCG/HR Place 1 patch (75 mcg total) onto the skin every 3 (three) days. 10 patch 0  . gabapentin (NEURONTIN) 300 MG capsule Take 1 capsule (300 mg total) by mouth 3 (three) times daily. 90 capsule 2  . megestrol (MEGACE) 40 MG tablet Take 40 mg by mouth 4 (four) times daily.    . Multiple Vitamins-Minerals (CENTRUM SILVER) tablet Take 1 tablet by mouth daily.    [place] Kitchen oxyCODONE-acetaminophen (PERCOCET) 10-325 MG tablet Take 1 tablet by mouth 3 (three) times daily as needed for pain. 90 tablet 0   No current facility-administered medications for this visit.     PHYSICAL EXAMINATION: ECOG PERFORMANCE STATUS: 1 - Symptomatic but completely ambulatory  BP 120/65 (BP Location: Left Arm, Patient Position: Sitting)   Pulse 67   Temp 99.8 F (37.7 C) (Tympanic)   Resp 18   Ht 5' (1.524 m)   Wt 121 lb (54.9 kg)   BMI 23.63 kg/m   Filed Weights   09/20/16 1417  Weight: 121 lb (54.9 kg)    GENERAL:  [place] Caucasian female patient walking herself Alert, no distress and comfortable. She Is accompanied by her husband.  EYES: no pallor or icterus OROPHARYNX: no thrush or ulceration; good dentition  NECK: supple, no masses felt LYMPH:  no palpable lymphadenopathy in the cervical, axillary or inguinal regions LUNGS: clear to auscultation and  No wheeze or crackles HEART/CVS: regular rate & rhythm and no murmurs; Left lower extremity swollen compared to the right.  ABDOMEN:abdomen soft, non-tender and normal bowel sounds Musculoskeletal:no cyanosis of digits and no clubbing  PSYCH: alert & oriented x 3 with fluent speech NEURO: no focal motor/sensory deficits SKIN:  no rashes or significant lesions  LABORATORY DATA:  I have reviewed the data as listed    Component Value Date/Time   NA 140 09/20/2016 1353   NA 139 11/04/2013 1257   K 3.9 09/20/2016 1353   K 4.3 03/05/2014 1156   CL 107 09/20/2016 1353   CL 106 11/04/2013 1257   CO2 24 09/20/2016 1353   CO2 26 11/04/2013 1257   GLUCOSE 98 09/20/2016 1353   GLUCOSE 96 11/04/2013 1257   BUN 28 (H) 09/20/2016 1353   BUN 13 11/04/2013 1257   CREATININE 1.14 (H) 09/20/2016 1353   CREATININE 0.90 11/04/2013 1257   CALCIUM 8.9 09/20/2016 1353   CALCIUM 8.5 11/04/2013 1257   PROT 6.6 09/20/2016 1353   PROT 6.8 11/04/2013 1257   ALBUMIN 3.6 09/20/2016 1353   ALBUMIN 3.5 11/04/2013 1257   AST 19 09/20/2016 1353   AST 25 11/04/2013 1257   ALT 15 09/20/2016 1353   ALT 20 11/04/2013 1257   ALKPHOS 34 (L) 09/20/2016 1353   ALKPHOS 64 11/04/2013 1257   BILITOT 0.7 09/20/2016 1353   BILITOT 0.5 11/04/2013 1257   GFRNONAA 43 (L) 09/20/2016 1353   GFRNONAA >60 11/04/2013 1257   GFRNONAA >60 03/29/2012 1650   GFRAA 50 (L) 09/20/2016 1353   GFRAA >60 11/04/2013 1257   GFRAA >60 03/29/2012 1650    No results found for: SPEP, UPEP  Lab Results  Component Value Date   WBC 4.8 09/20/2016   NEUTROABS 2.9 09/20/2016   HGB 13.1  09/20/2016   HCT 37.9 09/20/2016   MCV 92.6 09/20/2016   PLT 243 09/20/2016      Chemistry      Component Value Date/Time   NA 140 09/20/2016 1353  NA 139 11/04/2013 1257   K 3.9 09/20/2016 1353   K 4.3 03/05/2014 1156   CL 107 09/20/2016 1353   CL 106 11/04/2013 1257   CO2 24 09/20/2016 1353   CO2 26 11/04/2013 1257   BUN 28 (H) 09/20/2016 1353   BUN 13 11/04/2013 1257   CREATININE 1.14 (H) 09/20/2016 1353   CREATININE 0.90 11/04/2013 1257      Component Value Date/Time   CALCIUM 8.9 09/20/2016 1353   CALCIUM 8.5 11/04/2013 1257   ALKPHOS 34 (L) 09/20/2016 1353   ALKPHOS 64 11/04/2013 1257   AST 19 09/20/2016 1353   AST 25 11/04/2013 1257   ALT 15 09/20/2016 1353   ALT 20 11/04/2013 1257   BILITOT 0.7 09/20/2016 1353   BILITOT 0.5 11/04/2013 1257       RADIOGRAPHIC STUDIES: I have personally reviewed the radiological images as listed and agreed with the findings in the report. No results found.   ASSESSMENT & PLAN:  Endometrial cancer ([place]) Metastatic endometrial cancer to the bone- currently on Megace. Tolerating it well. July 2018- abdomen pelvis CT scan stable right iliac lesion.  Continue Megace at this time. No clinical progression noted. Will plan to get scan in end of dec 2018.   # Pain- improved. on fentanyl patch to 75/ and percocet 10 every 8-12 hours. New scripts given.   # Left lower extremity swelling chronic/DVT- continue Eliquis.  # Metastatic lesions to the bone on X-geva- every 4 weeks. Tolerating well.  Calcium normal; no concerns for osteonecrosis of the jaw. Continue ca+ vit D.   # Port flushed every 8 weeks; continue the same.   # weight loss-- improved. S/p meeting with Jolie.   # labs/X-geva- 4 weeks.   No orders of the defined types were placed in this encounter.     Cammie Sickle, MD 09/22/2016 1:32 PM

## 2016-09-27 ENCOUNTER — Encounter: Payer: Self-pay | Admitting: Obstetrics and Gynecology

## 2016-09-27 ENCOUNTER — Inpatient Hospital Stay (HOSPITAL_BASED_OUTPATIENT_CLINIC_OR_DEPARTMENT_OTHER): Payer: Medicare Other | Admitting: Obstetrics and Gynecology

## 2016-09-27 VITALS — BP 122/67 | HR 55 | Temp 97.3°F | Resp 18 | Ht 60.0 in | Wt 122.0 lb

## 2016-09-27 DIAGNOSIS — C7951 Secondary malignant neoplasm of bone: Secondary | ICD-10-CM | POA: Diagnosis not present

## 2016-09-27 DIAGNOSIS — C7911 Secondary malignant neoplasm of bladder: Secondary | ICD-10-CM | POA: Diagnosis not present

## 2016-09-27 DIAGNOSIS — C541 Malignant neoplasm of endometrium: Secondary | ICD-10-CM | POA: Diagnosis not present

## 2016-09-27 MED ORDER — FLUCONAZOLE 150 MG PO TABS: 150.0000 mg | ORAL_TABLET | Freq: Once | ORAL | 0 refills | Status: AC

## 2016-09-27 NOTE — Patient Instructions (Signed)
Skin Yeast Infection Skin yeast infection is a condition in which there is an overgrowth of yeast (candida) that normally lives on the skin. This condition usually occurs in areas of the skin that are constantly warm and moist, such as the armpits or the groin. What are the causes? This condition is caused by a change in the normal balance of the yeast and bacteria that live on the skin. What increases the risk? This condition is more likely to develop in:  People who are obese.  Pregnant women.  Women who take birth control pills.  People who have diabetes.  People who take antibiotic medicines.  People who take steroid medicines.  People who are malnourished.  People who have a weak defense (immune) system.  People who are 72 years of age or older.  What are the signs or symptoms? Symptoms of this condition include:  A red, swollen area of the skin.  Bumps on the skin.  Itchiness.  How is this diagnosed? This condition is diagnosed with a medical history and physical exam. Your health care provider may check for yeast by taking light scrapings of the skin to be viewed under a microscope. How is this treated? This condition is treated with medicine. Medicines may be prescribed or be available over-the-counter. The medicines may be:  Taken by mouth (orally).  Applied as a cream.  Follow these instructions at home:  Take or apply over-the-counter and prescription medicines only as told by your health care provider.  Eat more yogurt. This may help to keep your yeast infection from returning.  Maintain a healthy weight. If you need help losing weight, talk with your health care provider.  Keep your skin clean and dry.  If you have diabetes, keep your blood sugar under control. Contact a health care provider if:  Your symptoms go away and then return.  Your symptoms do not get better with treatment.  Your symptoms get worse.  Your rash spreads.  You have a  fever or chills.  You have new symptoms.  You have new warmth or redness of your skin. This information is not intended to replace advice given to you by your health care provider. Make sure you discuss any questions you have with your health care provider. Document Released: 10/11/2010 Document Revised: 09/19/2015 Document Reviewed: 07/27/2014 Elsevier Interactive Patient Education  2018 [place].   Fluconazole tablets What is this medicine? FLUCONAZOLE (floo KON na zole) is an antifungal medicine. It is used to treat certain kinds of fungal or yeast infections. This medicine may be used for other purposes; ask your health care provider or pharmacist if you have questions. COMMON BRAND NAME(S): Diflucan What should I tell my health care provider before I take this medicine? They need to know if you have any of these conditions: -history of irregular heart beat -kidney disease -an unusual or allergic reaction to fluconazole, other azole antifungals, medicines, foods, dyes, or preservatives -pregnant or trying to get pregnant -breast-feeding How should I use this medicine? Take this medicine by mouth. Follow the directions on the prescription label. Do not take your medicine more often than directed. Talk to your pediatrician regarding the use of this medicine in children. Special care may be needed. This medicine has been used in children as young as 62 months of age. Overdosage: If you think you have taken too much of this medicine contact a poison control center or emergency room at once. NOTE: This medicine is only for you. Do  not share this medicine with others. What if I miss a dose? If you miss a dose, take it as soon as you can. If it is almost time for your next dose, take only that dose. Do not take double or extra doses. What may interact with this medicine? Do not take this medicine with any of the following medications: -astemizole -certain medicines for irregular  heart beat like dofetilide, dronedarone, quinidine -cisapride -erythromycin -lomitapide -other medicines that prolong the QT interval (cause an abnormal heart rhythm) -pimozide -terfenadine -thioridazine -tolvaptan -ziprasidone This medicine may also interact with the following medications: -antiviral medicines for HIV or AIDS -birth control pills -certain antibiotics like rifabutin, rifampin -certain medicines for blood pressure like amlodipine, isradipine, felodipine, hydrochlorothiazide, losartan, nifedipine -certain medicines for cancer like cyclophosphamide, vinblastine, vincristine -certain medicines for cholesterol like atorvastatin, lovastatin, fluvastatin, simvastatin -certain medicines for depression, anxiety, or psychotic disturbances like amitriptyline, midazolam, nortriptyline, triazolam -certain medicines for diabetes like glipizide, glyburide, tolbutamide -certain medicines for pain like alfentanil, fentanyl, methadone -certain medicines for seizures like carbamazepine, phenytoin -certain medicines that treat or prevent blood clots like warfarin -halofantrine -medicines that lower your chance of fighting infection like cyclosporine, prednisone, tacrolimus -NSAIDS, medicines for pain and inflammation, like celecoxib, diclofenac, flurbiprofen, ibuprofen, meloxicam, naproxen -other medicines for fungal infections -sirolimus -theophylline -tofacitinib This list may not describe all possible interactions. Give your health care provider a list of all the medicines, herbs, non-prescription drugs, or dietary supplements you use. Also tell them if you smoke, drink alcohol, or use illegal drugs. Some items may interact with your medicine. What should I watch for while using this medicine? Visit your doctor or health care professional for regular checkups. If you are taking this medicine for a long time you may need blood work. Tell your doctor if your symptoms do not improve. Some  fungal infections need many weeks or months of treatment to cure. Alcohol can increase possible damage to your liver. Avoid alcoholic drinks. If you have a vaginal infection, do not have sex until you have finished your treatment. You can wear a sanitary napkin. Do not use tampons. Wear freshly washed cotton, not synthetic, panties. What side effects may I notice from receiving this medicine? Side effects that you should report to your doctor or health care professional as soon as possible: -allergic reactions like skin rash or itching, hives, swelling of the lips, mouth, tongue, or throat -dark urine -feeling dizzy or faint -irregular heartbeat or chest pain -redness, blistering, peeling or loosening of the skin, including inside the mouth -trouble breathing -unusual bruising or bleeding -vomiting -yellowing of the eyes or skin Side effects that usually do not require medical attention (report to your doctor or health care professional if they continue or are bothersome): -changes in how food tastes -diarrhea -headache -stomach upset or nausea This list may not describe all possible side effects. Call your doctor for medical advice about side effects. You may report side effects to FDA at 1-800-FDA-1088. Where should I keep my medicine? Keep out of the reach of children. Store at room temperature below 30 degrees C (86 degrees F). Throw away any medicine after the expiration date. NOTE: This sheet is a summary. It may not cover all possible information. If you have questions about this medicine, talk to your doctor, pharmacist, or health care provider.  2018 Elsevier/Gold Standard (2012-08-31 19:37:38)

## 2016-09-27 NOTE — Progress Notes (Signed)
Pt has some vulvar itching and discharge about every day. She wears a pad and it is on the pad when she goes to the bathroom

## 2016-09-27 NOTE — Progress Notes (Signed)
Gynecologic Oncology Visit   Referring Provider:  Dr. Rogue Bussing  Chief Concern: recurrent endometroid endometrial adenocarcinoma, stage Ib, grade 1.  Subjective:  Traci [name] is a 83 y.o. female who is seen in consultation from Dr. Oliva Bustard for recurrent endometroid endometrial adenocarcinoma, stage Ib, grade 1. Diagnosed with metastatic endometrial cancer to the bone. Stable disease on Megace therapy. She is also on X-geva- every 4 weeks, calcium, and vitamin D.   She saw Dr. Rogue Bussing on 09/20/2016 and had stable findings on exam. He recommended repeat scan in December 2018.   She presents today for a pelvic exam. She complains of a vaginal discharge, vulvar irritation/itching. She does not have vaginal bleeding.   Oncology History She was diagnosed with endometrial cancer in 12/13 and had recurrence in 2016.  01/2012  endometroid endometrial adenocarcinoma, stage Ib, grade 1, positive peritoneal cytology, no other risk factors or extra-uterine disease, TAH/BSO and staging, lymphocyst post-op, vaginal brachytherapy  In 2016 patient had abnormal mammogram of the left breast and biopsy was positive for invasive carcinoma ER+/PR+ treated with lumpectomy. Staging work up showed right hydronephrosis and right pelvic mass invading bone and muscle.  See below.  10/28/14 Cystoscopy done for stent placement and showed tumor invading bladder.  BIOPSY: WELL-DIFFERENTIATED ADENOCARCINOMA MORPHOLOGICALLY SIMILAR TO PREVIOUS ENDOMETRIAL ADENOCARCINOMA.  The stent could not be placed so 11/13/14 Right PCN placed in radiology.  CT scan 11/09/14 Bilateral pelvic lymphadenopathy, right side greater than left, consistent with metastatic disease. Small soft tissue nodule involving the left vaginal cuff and 1.5 cm soft tissue nodule or lymphadenopathy in the sigmoid mesocolon, consistent with carcinoma. Right iliac bone metastasis with associated iliopsoas soft tissue mass. Severe right hydronephrosis and  diffuse right renal parenchymal atrophy secondary to pelvic metastatic disease. No metastatic disease identified within the abdomen or chest.  She underwent external pelvic radiation and completed in 11/16 with relief in the pain,  Then started on Megace 40 mg qid in December of 2016 and developed deep vein thrombosis in the left lower extremity (February 09, 2015). Hematuria due toxeralto IVC filter placement on February 11, 2015 and xeralto stopped. Cystoscopy no evidence of bleeding, so patient has been started on ELOQUIS (March 15, 2015).  Patient again had a significant bleeding with hematuria with eloquis and was discontinued and Plavix started. June 2017- PET IMPROVED. August 25 2015- LLE DVT- start Eliquis [stop plavix];   PET/CT 09/28/15 CHEST A new 9 mm hypermetabolic mediastinal lymph node is seen in the precarinal region. This measures 9 mm on image 75/3, with SUV max of 4.5. No other hypermetabolic lymph nodes identified within the thorax. No suspicious pulmonary nodules seen on CT images. Mild emphysema and bilateral upper lobe scarring again noted.  ABDOMEN/PELVIS No abnormal hypermetabolic activity within the liver, pancreas, adrenal glands, or spleen. No hypermetabolic lymph nodes or other nodules seen within in abdomen or pelvis.  Previously seen soft tissue prominence along the left pelvic sidewall obturator space, and soft tissue nodularity along the left vaginal cuff are no longer visualized. No hypermetabolic peritoneal nodules identified. No evidence of ascites.  Right ureteral stent remains in appropriate position. No evidence of hydronephrosis. Diffuse right renal parenchymal atrophy again noted. IVC filter remains in place. Sigmoid diverticulosis again demonstrated, without evidence of diverticulitis. Prior hysterectomy.  SKELETON Decreased size of lytic bone lesion and associated soft tissue mass involving the right ilium. This currently measures 2.9 x 4.1  cm on image 184/3 compared to 3.4 x 4.4 cm previously. This shows low-grade  metabolic activity with SUV max of 3.5. No other suspicious bone lesions identified. Lumbar spine fusion hardware again noted.  IMPRESSION: New 9 mm hypermetabolic precarinal mediastinal lymph node. Differential diagnosis includes metastatic disease and reactive/ inflammatory etiology. Recommend continued followup by chest CT with contrast in 3-6 months.  Decreased size of lytic bone metastasis involving the right ilium.  No other sites of metabolically active metastatic disease identified   MOLECULAR STUDIES: 08/23/15- MMR-ordered.  MICROSATELLITE INSTABILITY IMMUNOHISTOCHEMISTRY  MISMATCH REPAIR PROTEINS:   MLH1: Intact nuclear expression  MSH2: Intact nuclear expression  MSH6: Intact nuclear expression  PMS2: Intact nuclear expression   Interpretation: No loss of nuclear expression of mismatch repair  proteins: Low probability of MSI-H.    01/03/2016 PET/CT result below.   FINDINGS:  Hypermetabolic low right paratracheal node and right iliac wing metastasis stable.  CHEST Left thyroid is mildly hypermetabolic, as before, nonspecific. Low right paratracheal lymph node measures 7 mm (CT image 80) with an SUV max of 4.7, stable from 09/28/2015. SKELETON An expansile lytic lesion in the right iliac wing is unchanged, measuring 3.5 x 3.7 cm and with an SUV max of 3.1, as before.     Health maintenance: Her breast cancer screening is done by her PCP, Dr. Doy Hutching, and she is scheduled for a mammogram today.   Problem List: Patient Active Problem List   Diagnosis Date Noted  . Cancer, metastatic to bone ([place]) 06/21/2016  . Pain and swelling of left lower leg 08/25/2015  . Seizure disorder ([place]) 04/06/2015  . Personal history of urinary infection 03/15/2015  . Cerebrovascular accident (CVA) ([place]) 02/18/2015  . Endometrial cancer ([place]) 11/18/2014  . Degeneration of intervertebral disc of  lumbosacral region 11/14/2014  . Overflow incontinence 11/14/2014  . Pure hypercholesterolemia 11/14/2014  . Acquired hypothyroidism 10/20/2014  . DDD (degenerative disc disease), lumbosacral 10/20/2014  . Benign neoplasm of colon 10/20/2014  . Acid reflux 10/20/2014  . Esophageal stenosis 10/20/2014  . Bloodgood disease 10/20/2014  . History of colon polyps 10/20/2014  . H/O gastric ulcer 10/20/2014  . Healed or old pulmonary embolism 10/20/2014  . History of urinary anomaly 10/20/2014  . BP (high blood pressure) 10/20/2014  . Hypersomnia with sleep apnea 10/20/2014  . Multinodular goiter 10/20/2014  . Angina pectoris ([place]) 10/20/2014  . Seizure ([place]) 10/20/2014  . Digestive symptom 10/20/2014  . Incontinence overflow, urine 10/20/2014  . Dupuytren's contracture of foot 10/20/2014  . Hypercholesterolemia without hypertriglyceridemia 10/20/2014  . Cancer of left breast ([place]) 10/20/2014  . History of endometrial cancer 09/30/2014  . Lumbar radiculopathy 09/04/2014  . Deep vein thrombosis ([place]) 07/15/2013  . Deep vein thrombosis (DVT) ([place]) 07/15/2013  . Stroke [facility]) 04/07/2011    Past Medical History: Past Medical History:  Diagnosis Date  . Arthritis    Degenerative Disc Disease  . Bone cancer ([place])   . Breast cancer ([place]) 2016   LT LUMPECTOMY 03-2015  . Chronic kidney disease    HYDRONEPHROSIS  . Deep vein blood clot of left lower extremity ([place]) February 11, 2015   Left Leg  . Endometrial adenocarcinoma ([place]) 01/2012    stage Ib, grade 1, positive peritoneal cytology, no other risk factors or extra-uterine disease  . Gastric ulcer   . GERD (gastroesophageal reflux disease)   . Hearing loss   . Hiccups   . History of brachytherapy   . History of colon polyps   . History of DVT (deep vein thrombosis)   . History of esophageal stricture   .  History of shingles   . Inguinal lymphocyst   . Mild aphasia   . Pain    SEVERE CHRONIC RIGHT LEG,HIP,FOOT  . PE (pulmonary  embolism)    H/O  . Stroke [facility]) 04/06/2011   residual:  aphasia    Past Surgical History: Past Surgical History:  Procedure Laterality Date  . ABDOMINAL HYSTERECTOMY  December 2013   history of cervical cancer  . BACK SURGERY     fusion of L3&4  . BREAST BIOPSY Left 10/13/2014   POS  . CATARACT EXTRACTION W/ INTRAOCULAR LENS IMPLANT     right  . CYSTOSCOPY W/ RETROGRADES Right 10/28/2014   Procedure: CYSTOSCOPY WITH RETROGRADE PYELOGRAM ATTEMPT, UNABLE TO PASS ;  Surgeon: [name], MD;  Location: [facility] ORS;  Service: Urology;  Laterality: Right;  . CYSTOSCOPY W/ RETROGRADES Right 09/15/2015   Procedure: CYSTOSCOPY WITH RETROGRADE PYELOGRAM;  Surgeon: [name], MD;  Location: [facility];  Service: Urology;  Laterality: Right;  . CYSTOSCOPY W/ URETERAL STENT PLACEMENT Right 03/10/2015   Procedure: CYSTOSCOPY WITH STENT REPLACEMENT;  Surgeon: [name], MD;  Location: [facility];  Service: Urology;  Laterality: Right;  . CYSTOSCOPY W/ URETERAL STENT PLACEMENT Right 06/09/2015   Procedure: CYSTOSCOPY WITH STENT REPLACEMENT;  Surgeon: [name], MD;  Location: [facility];  Service: Urology;  Laterality: Right;  . CYSTOSCOPY W/ URETERAL STENT PLACEMENT Right 09/15/2015   Procedure: CYSTOSCOPY WITH STENT REPLACEMENT;  Surgeon: [name], MD;  Location: [facility];  Service: Urology;  Laterality: Right;  . CYSTOSCOPY WITH STENT PLACEMENT Right 10/28/2014   Procedure: BLADDER BIOPSY WITH FULGERATION ;  Surgeon: [name], MD;  Location: [facility];  Service: Urology;  Laterality: Right;  . ESOPHAGEAL DILATION    . EYE SURGERY Bilateral    Cataract Extraction  . PARTIAL MASTECTOMY WITH NEEDLE LOCALIZATION Left 03/30/2015   Procedure: PARTIAL MASTECTOMY WITH NEEDLE LOCALIZATION;  Surgeon: [name], MD;  Location: [facility];  Service: General;  Laterality: Left;  . PERIPHERAL VASCULAR CATHETERIZATION N/A 02/11/2015   Procedure: IVC Filter Insertion;   Surgeon: Algernon Huxley, MD;  Location: [place] [facility];  Service: Cardiovascular;  Laterality: N/A;  . PORTACATH PLACEMENT Right 11/26/2014   Procedure: INSERTION PORT-A-CATH;  Surgeon: [name], MD;  Location: [facility];  Service: General;  Laterality: Right;  . SENTINEL NODE BIOPSY Left 03/30/2015   Procedure: SENTINEL NODE BIOPSY;  Surgeon: [name], MD;  Location: [facility];  Service: General;  Laterality: Left;  . thyroid removed    . THYROIDECTOMY, PARTIAL    . TONSILLECTOMY    . TOTAL ABDOMINAL HYSTERECTOMY W/ BILATERAL SALPINGOOPHORECTOMY Bilateral    staging biopsies    Past Gynecologic History:  See HPI  OB History:  OB History  Gravida Para Term Preterm AB Living  2            SAB TAB Ectopic Multiple Live Births               # Outcome Date GA Lbr Len/2nd Weight Sex Delivery Anes PTL Lv  2 Gravida           1 Gravida               Family History: Family History  Problem Relation Age of Onset  . Heart attack Mother   . Breast cancer Maternal Aunt        x 2    Social History: Social History  Social History  . Marital status: Married    Spouse name: N/A  . Number of children: N/A  . Years of education: N/A   Occupational History  . Not on file.   Social History Main Topics  . Smoking status: Never Smoker  . Smokeless tobacco: Never Used  . Alcohol use No  . Drug use: No  . Sexual activity: Yes    Partners: Male   Other Topics Concern  . Not on file   Social History Narrative  . No narrative on file    Allergies: Allergies  Allergen Reactions  . Penicillins Rash    .Has patient had a PCN reaction causing immediate rash, facial/tongue/throat swelling, SOB or lightheadedness with hypotension: No Has patient had a PCN reaction causing severe rash involving mucus membranes or skin necrosis: No Has patient had a PCN reaction that required hospitalization: No Has patient had a PCN reaction occurring within the last 10  years: Unknown If all of the above answers are "NO", then may proceed with Cephalosporin use.   . Acetaminophen     Other reaction(s): Unknown  . Hydrocodone-Acetaminophen Nausea Only  . Novocain [Procaine] Other (See Comments) and Nausea And Vomiting    Other reaction(s): Unknown Chest pain  . Sulfa Antibiotics Other (See Comments)    CHEST PAIN    Current Medications: Current Outpatient Prescriptions  Medication Sig Dispense Refill  . apixaban (ELIQUIS) 5 MG TABS tablet Take 1 tablet (5 mg total) by mouth 2 (two) times daily. 60 tablet 0  . fentaNYL (DURAGESIC - DOSED MCG/HR) 75 MCG/HR Place 1 patch (75 mcg total) onto the skin every 3 (three) days. 10 patch 0  . gabapentin (NEURONTIN) 300 MG capsule Take 1 capsule (300 mg total) by mouth 3 (three) times daily. 90 capsule 2  . megestrol (MEGACE) 40 MG tablet Take 40 mg by mouth 4 (four) times daily.    . Multiple Vitamins-Minerals (CENTRUM SILVER) tablet Take 1 tablet by mouth daily.    Marland Kitchen oxyCODONE-acetaminophen (PERCOCET) 10-325 MG tablet Take 1 tablet by mouth 3 (three) times daily as needed for pain. 90 tablet 0   No current facility-administered medications for this visit.     Review of Systems General: fatigue  HEENT: no complaints  Lungs: no complaints  Cardiac: no complaints  GI: constipation  GU: no complaints  Musculoskeletal: back and right hip pain  Extremities: right leg pain  Skin: no complaints  Neuro: neuropathic pain in her RLE due to back issues in the past - no complaints today  Endocrine: no complaints  Psych: occasionally feeling sad      Objective:  Physical Examination:  BP 122/67   Pulse (!) 55   Temp (!) 97.3 F (36.3 C) (Tympanic)   Resp 18   Ht 5' (1.524 m)   Wt 122 lb (55.3 kg)   BMI 23.83 kg/m    ECOG Performance Status: 1 - Symptomatic but completely ambulatory  General appearance: alert and appears stated age HEENT:PERRLA, extra ocular movement intact and sclera clear,  anicteric Lymph node survey: non-palpable inguinal nodes Abdomen: soft, non-tender, without masses or organomegaly, no hernias and well healed incision Extremities: extremities normal, atraumatic, no cyanosis or edema Neurological exam reveals alert, oriented, normal speech, no focal findings.  Pelvic: exam chaperoned by nurse;  Vulva: diffuse erythematous appearing vulva with no masses, tenderness or lesions; Vagina: agglutinated and length is only 4 cm, no tumor seen or palpated, no discharge appreciated at all; Adnexa: surgically absent; Uterus  and Cervix: surgically absent, vaginal cuff not visualized due to agglutination;  No masses.  Rectal: confirmatory, a large amount of stool which limits exam.    Assessment:  LACHELE LIEVANOS is a 83 y.o. female diagnosed with pelvic recurrence of stage IB grade 1 endometroid endometrial adenocarcinoma s/p surgery and brachytherapy 2013. She had a right pelvic side wall mass invading bone/bladder and hydronephrosis, and got a ureteral stent and external pelvic radiation in 11/16.  Now on adjuvant Megace and has residual disease in pelvis, with partial response on PET/CT.    Vulvitis most likely fungal in origin.   Metastatic lesions to the bone on X-geva- every 4 weeks, calcium and vit D managed by Dr. Rogue Bussing.   Left lower extremity swelling chronic/DVT- continue Eliquis. Has had issues with VTE and bleeding managed by Dr Rogue Bussing. IVC filter remains in place.    She has an atrophic right kidney and has right ureteral stent managed by Urology.    Plan:   Problem List Items Addressed This Visit      Genitourinary   Endometrial cancer ([place]) - Primary      Continue Megace therapy per Dr Rogue Bussing.  Her recurrent grade I endometrial cancer is still persistent in the right pelvis and involves bone, but she has minimal symptoms and partial response to Megace with stable disease on follow up imaging.    Treat vulvar candidiasis with Diflucan  150 mg single dose. If she has recurrent symptoms we can dose for longer period of time and add topical anti-fungals.    She will RTC to see [place] in 6 months and will follow up with Dr Rogue Bussing as scheduled.  Gillis Ends, MD  CC: Dr. Ouida Sills  Dr. Rogue Bussing

## 2016-10-12 ENCOUNTER — Ambulatory Visit
Admission: RE | Admit: 2016-10-12 | Discharge: 2016-10-12 | Disposition: A | Discharge status: Home / Self Care | Payer: Medicare Other | Source: Ambulatory Visit | Attending: Surgery | Admitting: Surgery

## 2016-10-12 DIAGNOSIS — Z853 Personal history of malignant neoplasm of breast: Secondary | ICD-10-CM | POA: Insufficient documentation

## 2016-10-18 ENCOUNTER — Inpatient Hospital Stay: Payer: Medicare Other

## 2016-10-18 ENCOUNTER — Inpatient Hospital Stay: Payer: Medicare Other | Attending: Internal Medicine | Admitting: Internal Medicine

## 2016-10-18 VITALS — BP 130/68 | HR 52 | Temp 97.4°F | Resp 16 | Wt 118.2 lb

## 2016-10-18 DIAGNOSIS — C7911 Secondary malignant neoplasm of bladder: Secondary | ICD-10-CM | POA: Insufficient documentation

## 2016-10-18 DIAGNOSIS — Z86010 Personal history of colonic polyps: Secondary | ICD-10-CM | POA: Diagnosis not present

## 2016-10-18 DIAGNOSIS — Z9012 Acquired absence of left breast and nipple: Secondary | ICD-10-CM | POA: Diagnosis not present

## 2016-10-18 DIAGNOSIS — N189 Chronic kidney disease, unspecified: Secondary | ICD-10-CM | POA: Diagnosis not present

## 2016-10-18 DIAGNOSIS — Z8673 Personal history of transient ischemic attack (TIA), and cerebral infarction without residual deficits: Secondary | ICD-10-CM | POA: Diagnosis not present

## 2016-10-18 DIAGNOSIS — C541 Malignant neoplasm of endometrium: Secondary | ICD-10-CM | POA: Diagnosis not present

## 2016-10-18 DIAGNOSIS — Z90722 Acquired absence of ovaries, bilateral: Secondary | ICD-10-CM | POA: Insufficient documentation

## 2016-10-18 DIAGNOSIS — C50812 Malignant neoplasm of overlapping sites of left female breast: Secondary | ICD-10-CM

## 2016-10-18 DIAGNOSIS — R634 Abnormal weight loss: Secondary | ICD-10-CM | POA: Insufficient documentation

## 2016-10-18 DIAGNOSIS — Z8541 Personal history of malignant neoplasm of cervix uteri: Secondary | ICD-10-CM | POA: Insufficient documentation

## 2016-10-18 DIAGNOSIS — Z79899 Other long term (current) drug therapy: Secondary | ICD-10-CM | POA: Insufficient documentation

## 2016-10-18 DIAGNOSIS — Z803 Family history of malignant neoplasm of breast: Secondary | ICD-10-CM | POA: Insufficient documentation

## 2016-10-18 DIAGNOSIS — Z880 Allergy status to penicillin: Secondary | ICD-10-CM | POA: Insufficient documentation

## 2016-10-18 DIAGNOSIS — Z90710 Acquired absence of both cervix and uterus: Secondary | ICD-10-CM | POA: Diagnosis not present

## 2016-10-18 DIAGNOSIS — C7951 Secondary malignant neoplasm of bone: Secondary | ICD-10-CM | POA: Diagnosis not present

## 2016-10-18 DIAGNOSIS — M1990 Unspecified osteoarthritis, unspecified site: Secondary | ICD-10-CM | POA: Diagnosis not present

## 2016-10-18 DIAGNOSIS — M25551 Pain in right hip: Secondary | ICD-10-CM | POA: Insufficient documentation

## 2016-10-18 DIAGNOSIS — Z8619 Personal history of other infectious and parasitic diseases: Secondary | ICD-10-CM | POA: Diagnosis not present

## 2016-10-18 DIAGNOSIS — Z923 Personal history of irradiation: Secondary | ICD-10-CM | POA: Insufficient documentation

## 2016-10-18 DIAGNOSIS — Z7901 Long term (current) use of anticoagulants: Secondary | ICD-10-CM | POA: Diagnosis not present

## 2016-10-18 DIAGNOSIS — Z170 Estrogen receptor positive status [ER+]: Secondary | ICD-10-CM | POA: Insufficient documentation

## 2016-10-18 DIAGNOSIS — Z86718 Personal history of other venous thrombosis and embolism: Secondary | ICD-10-CM | POA: Insufficient documentation

## 2016-10-18 DIAGNOSIS — Z9221 Personal history of antineoplastic chemotherapy: Secondary | ICD-10-CM | POA: Diagnosis not present

## 2016-10-18 DIAGNOSIS — Z86711 Personal history of pulmonary embolism: Secondary | ICD-10-CM | POA: Insufficient documentation

## 2016-10-18 DIAGNOSIS — K219 Gastro-esophageal reflux disease without esophagitis: Secondary | ICD-10-CM | POA: Diagnosis not present

## 2016-10-18 LAB — COMPREHENSIVE METABOLIC PANEL
ALBUMIN: 3.9 g/dL (ref 3.5–5.0)
ALT: 14 U/L (ref 14–54)
AST: 21 U/L (ref 15–41)
Alkaline Phosphatase: 30 U/L — ABNORMAL LOW (ref 38–126)
Anion gap: 7 (ref 5–15)
BILIRUBIN TOTAL: 0.7 mg/dL (ref 0.3–1.2)
BUN: 33 mg/dL — AB (ref 6–20)
CO2: 25 mmol/L (ref 22–32)
CREATININE: 1.17 mg/dL — AB (ref 0.44–1.00)
Calcium: 9.1 mg/dL (ref 8.9–10.3)
Chloride: 106 mmol/L (ref 101–111)
GFR calc Af Amer: 48 mL/min — ABNORMAL LOW (ref >60)
GFR calc non Af Amer: 42 mL/min — ABNORMAL LOW (ref >60)
Glucose, Bld: 105 mg/dL — ABNORMAL HIGH (ref 65–99)
POTASSIUM: 4 mmol/L (ref 3.5–5.1)
Sodium: 138 mmol/L (ref 135–145)
TOTAL PROTEIN: 6.7 g/dL (ref 6.5–8.1)

## 2016-10-18 LAB — CBC WITH DIFFERENTIAL/PLATELET
BASOS ABS: 0 10*3/uL (ref 0–0.1)
BASOS PCT: 1 %
Eosinophils Absolute: 0.2 10*3/uL (ref 0–0.7)
Eosinophils Relative: 5 %
HEMATOCRIT: 40.2 % (ref 35.0–47.0)
HEMOGLOBIN: 13.9 g/dL (ref 12.0–16.0)
LYMPHS PCT: 31 %
Lymphs Abs: 1.2 10*3/uL (ref 1.0–3.6)
MCH: 32 pg (ref 26.0–34.0)
MCHC: 34.6 g/dL (ref 32.0–36.0)
MCV: 92.5 fL (ref 80.0–100.0)
MONO ABS: 0.4 10*3/uL (ref 0.2–0.9)
Monocytes Relative: 10 %
NEUTROS ABS: 2 10*3/uL (ref 1.4–6.5)
NEUTROS PCT: 53 %
Platelets: 206 10*3/uL (ref 150–440)
RBC: 4.34 MIL/uL (ref 3.80–5.20)
RDW: 14.4 % (ref 11.5–14.5)
WBC: 3.8 10*3/uL (ref 3.6–11.0)

## 2016-10-18 MED ORDER — OXYCODONE-ACETAMINOPHEN 10-325 MG PO TABS: 1.0000 | ORAL_TABLET | Freq: Three times a day (TID) | ORAL | 0 refills | Status: DC | PRN

## 2016-10-18 MED ORDER — FENTANYL 75 MCG/HR TD PT72: 75.0000 ug | MEDICATED_PATCH | TRANSDERMAL | 0 refills | Status: DC

## 2016-10-18 MED ORDER — DENOSUMAB 120 MG/1.7ML ~~LOC~~ SOLN
120.0000 mg | Freq: Once | SUBCUTANEOUS | Status: AC
  Administered 2016-10-18: 120 mg via SUBCUTANEOUS
  Filled 2016-10-18: qty 1.7

## 2016-10-18 NOTE — Progress Notes (Signed)
[place] OFFICE PROGRESS NOTE  Patient Care Team: Idelle Crouch, MD as PCP - General (Unknown Physician Specialty) [name], MD as Referring Physician (Surgery) [name], MD as Consulting Physician (Urology)  Cancer of left breast [facility])   Staging form: Breast, AJCC 7th Edition     Clinical: Stage IA (T1b, N0, M0) - Signed by Forest Gleason, MD on 10/20/2014    Oncology History   . Patient has a previous history of endometrium carcinoma of endometrium stage IB disease status post bilateral salpingo-oophorectomy and radiation therapy 4. Right hydronephrosis detected on MRI scan off lumbosacral spine. Cystoscopy with attempted stent placement revealed bladder tumor in September of 2016 biopsies positive for recurrent endometrial cancer  5. Patient had stent placement in the right kidney Started radiation therapy 6. Patient has finished radiation therapy with relief in the pain (November, 2016) 7. Started on Megace in December of 2016 8 deep vein thrombosis in the left lower extremity (February 08, 2014) Hematuria due to xeralto IVC filter placement on February 11, 2015 She is off xeralto 9.condition recently had a cystoscopy no evidence of bleeding was found so patient has been started on ELOQUIS (March 15, 2015)  10. Patient again had a significant bleeding with hematuria with eloquis and has been discontinued; Patient is on Plavix. ON  Megace. AUG 2017- PET IMPROVED; mediastinal uptake- monitor for now.   # August 25 2015- LLE DVT- start Eliquis [stop plavix]; PET  FEB 2017-LEFT BREAST CA [9'0 clock- overlapping site] Estrogen receptor positive. Progesterone receptor positive. HER-2 receptor; Her 2neg; 5 mm tumor clinically stage IB N0 M0 tumor;s/p Lumpectomy; NO RT  # PET NOV 27th- STABLE right iliac lesions [increasing pain]/ right paratrac- refer to RT  # X-geva q 4 w  # MOLECULAR STUDIES: 08/23/15- MMR-STABLE.       Cancer of left breast ([place])   10/20/2014 Initial Diagnosis    Cancer of left breast       Endometrial cancer ([place])   11/18/2014 Initial Diagnosis    Endometrial cancer ([place])        INTERVAL HISTORY:  Traci [name] 83 y.o.  female pleasant patient above history of Metastatic endometrial cancer currently on Megace- Is here for follow-up/ Accompanied by her husband.  Patient continues to have right hip pain; currently stable on fentanyl patch. She is also needing to take breakthrough pain medication up to 2 times a day.    Appetite is good;weight loss improved/stable. Status post meeting with dietitian. She walks with a cane. No new shortness of breath or cough. No falls.  REVIEW OF SYSTEMS:  A complete 10 point review of system is done which is negative except mentioned above/history of present illness.   PAST MEDICAL HISTORY :  Past Medical History:  Diagnosis Date  . Arthritis    Degenerative Disc Disease  . Bone cancer ([place])   . Breast cancer ([place]) 2016   LT LUMPECTOMY 03-2015  . Chronic kidney disease    HYDRONEPHROSIS  . Deep vein blood clot of left lower extremity ([place]) February 11, 2015   Left Leg  . Endometrial adenocarcinoma ([place]) 01/2012    stage Ib, grade 1, positive peritoneal cytology, no other risk factors or extra-uterine disease  . Gastric ulcer   . GERD (gastroesophageal reflux disease)   . Hearing loss   . Hiccups   . History of brachytherapy   . History of colon polyps   . History of DVT (deep vein  thrombosis)   . History of esophageal stricture   . History of shingles   . Inguinal lymphocyst   . Mild aphasia   . Pain    SEVERE CHRONIC RIGHT LEG,HIP,FOOT  . PE (pulmonary embolism)    H/O  . Stroke ([place]) 04/06/2011   residual:  aphasia    PAST SURGICAL HISTORY :   Past Surgical History:  Procedure Laterality Date  . ABDOMINAL HYSTERECTOMY  December 2013   history of cervical cancer  . BACK SURGERY     fusion of L3&4  . BREAST BIOPSY Left  10/13/2014   POS  . BREAST LUMPECTOMY Left 03/2015  . CATARACT EXTRACTION W/ INTRAOCULAR LENS IMPLANT     right  . CYSTOSCOPY W/ RETROGRADES Right 10/28/2014   Procedure: CYSTOSCOPY WITH RETROGRADE PYELOGRAM ATTEMPT, UNABLE TO PASS ;  Surgeon: [name], MD;  Location: [facility];  Service: Urology;  Laterality: Right;  . CYSTOSCOPY W/ RETROGRADES Right 09/15/2015   Procedure: CYSTOSCOPY WITH RETROGRADE PYELOGRAM;  Surgeon: [name], MD;  Location: [facility];  Service: Urology;  Laterality: Right;  . CYSTOSCOPY W/ URETERAL STENT PLACEMENT Right 03/10/2015   Procedure: CYSTOSCOPY WITH STENT REPLACEMENT;  Surgeon: [name], MD;  Location: [facility];  Service: Urology;  Laterality: Right;  . CYSTOSCOPY W/ URETERAL STENT PLACEMENT Right 06/09/2015   Procedure: CYSTOSCOPY WITH STENT REPLACEMENT;  Surgeon: [name], MD;  Location: [facility];  Service: Urology;  Laterality: Right;  . CYSTOSCOPY W/ URETERAL STENT PLACEMENT Right 09/15/2015   Procedure: CYSTOSCOPY WITH STENT REPLACEMENT;  Surgeon: [name], MD;  Location: [facility];  Service: Urology;  Laterality: Right;  . CYSTOSCOPY WITH STENT PLACEMENT Right 10/28/2014   Procedure: BLADDER BIOPSY WITH FULGERATION ;  Surgeon: [name], MD;  Location: [facility];  Service: Urology;  Laterality: Right;  . ESOPHAGEAL DILATION    . EYE SURGERY Bilateral    Cataract Extraction  . PARTIAL MASTECTOMY WITH NEEDLE LOCALIZATION Left 03/30/2015   Procedure: PARTIAL MASTECTOMY WITH NEEDLE LOCALIZATION;  Surgeon: [name], MD;  Location: [facility];  Service: General;  Laterality: Left;  . PERIPHERAL VASCULAR CATHETERIZATION N/A 02/11/2015   Procedure: IVC Filter Insertion;  Surgeon: Algernon Huxley, MD;  Location: [facility];  Service: Cardiovascular;  Laterality: N/A;  . PORTACATH PLACEMENT Right 11/26/2014   Procedure: INSERTION PORT-A-CATH;  Surgeon: [name], MD;  Location: [facility];  Service:  General;  Laterality: Right;  . SENTINEL NODE BIOPSY Left 03/30/2015   Procedure: SENTINEL NODE BIOPSY;  Surgeon: [name], MD;  Location: [facility];  Service: General;  Laterality: Left;  . thyroid removed    . THYROIDECTOMY, PARTIAL    . TONSILLECTOMY    . TOTAL ABDOMINAL HYSTERECTOMY W/ BILATERAL SALPINGOOPHORECTOMY Bilateral    staging biopsies    FAMILY HISTORY :   Family History  Problem Relation Age of Onset  . Heart attack Mother   . Breast cancer Maternal Aunt        x 2    SOCIAL HISTORY:   Social History  Substance Use Topics  . Smoking status: Never Smoker  . Smokeless tobacco: Never Used  . Alcohol use No    ALLERGIES:  is allergic to penicillins; acetaminophen; hydrocodone-acetaminophen; novocain [procaine]; and sulfa antibiotics.  MEDICATIONS:  Current Outpatient Prescriptions  Medication Sig Dispense Refill  . apixaban (ELIQUIS) 5 MG TABS tablet Take 1 tablet (5 mg total) by mouth 2 (two) times daily. 60 tablet  0  . fentaNYL (DURAGESIC - DOSED MCG/HR) 75 MCG/HR Place 1 patch (75 mcg total) onto the skin every 3 (three) days. 10 patch 0  . gabapentin (NEURONTIN) 300 MG capsule Take 1 capsule (300 mg total) by mouth 3 (three) times daily. 90 capsule 2  . megestrol (MEGACE) 40 MG tablet Take 40 mg by mouth 4 (four) times daily.    . Multiple Vitamins-Minerals (CENTRUM SILVER) tablet Take 1 tablet by mouth daily.    Marland Kitchen oxyCODONE-acetaminophen (PERCOCET) 10-325 MG tablet Take 1 tablet by mouth 3 (three) times daily as needed for pain. 90 tablet 0   No current facility-administered medications for this visit.     PHYSICAL EXAMINATION: ECOG PERFORMANCE STATUS: 1 - Symptomatic but completely ambulatory  BP 130/68 (BP Location: Left Arm, Patient Position: Sitting)   Pulse (!) 52   Temp (!) 97.4 F (36.3 C) (Tympanic)   Resp 16   Wt 118 lb 3.2 oz (53.6 kg)   BMI 23.08 kg/m   Filed Weights   10/18/16 1356  Weight: 118 lb 3.2 oz (53.6 kg)     GENERAL: [place] Caucasian female patient walking herself Alert, no distress and comfortable. She Is accompanied by her husband.  EYES: no pallor or icterus OROPHARYNX: no thrush or ulceration; good dentition  NECK: supple, no masses felt LYMPH:  no palpable lymphadenopathy in the cervical, axillary or inguinal regions LUNGS: clear to auscultation and  No wheeze or crackles HEART/CVS: regular rate & rhythm and no murmurs; Left lower extremity swollen compared to the right.  ABDOMEN:abdomen soft, non-tender and normal bowel sounds Musculoskeletal:no cyanosis of digits and no clubbing  PSYCH: alert & oriented x 3 with fluent speech NEURO: no focal motor/sensory deficits SKIN:  no rashes or significant lesions  LABORATORY DATA:  I have reviewed the data as listed    Component Value Date/Time   NA 138 10/18/2016 1330   NA 139 11/04/2013 1257   K 4.0 10/18/2016 1330   K 4.3 03/05/2014 1156   CL 106 10/18/2016 1330   CL 106 11/04/2013 1257   CO2 25 10/18/2016 1330   CO2 26 11/04/2013 1257   GLUCOSE 105 (H) 10/18/2016 1330   GLUCOSE 96 11/04/2013 1257   BUN 33 (H) 10/18/2016 1330   BUN 13 11/04/2013 1257   CREATININE 1.17 (H) 10/18/2016 1330   CREATININE 0.90 11/04/2013 1257   CALCIUM 9.1 10/18/2016 1330   CALCIUM 8.5 11/04/2013 1257   PROT 6.7 10/18/2016 1330   PROT 6.8 11/04/2013 1257   ALBUMIN 3.9 10/18/2016 1330   ALBUMIN 3.5 11/04/2013 1257   AST 21 10/18/2016 1330   AST 25 11/04/2013 1257   ALT 14 10/18/2016 1330   ALT 20 11/04/2013 1257   ALKPHOS 30 (L) 10/18/2016 1330   ALKPHOS 64 11/04/2013 1257   BILITOT 0.7 10/18/2016 1330   BILITOT 0.5 11/04/2013 1257   GFRNONAA 42 (L) 10/18/2016 1330   GFRNONAA >60 11/04/2013 1257   GFRNONAA >60 03/29/2012 1650   GFRAA 48 (L) 10/18/2016 1330   GFRAA >60 11/04/2013 1257   GFRAA >60 03/29/2012 1650    No results found for: SPEP, UPEP  Lab Results  Component Value Date   WBC 3.8 10/18/2016   NEUTROABS 2.0  10/18/2016   HGB 13.9 10/18/2016   HCT 40.2 10/18/2016   MCV 92.5 10/18/2016   PLT 206 10/18/2016      Chemistry      Component Value Date/Time   NA 138 10/18/2016 1330   NA 139  11/04/2013 1257   K 4.0 10/18/2016 1330   K 4.3 03/05/2014 1156   CL 106 10/18/2016 1330   CL 106 11/04/2013 1257   CO2 25 10/18/2016 1330   CO2 26 11/04/2013 1257   BUN 33 (H) 10/18/2016 1330   BUN 13 11/04/2013 1257   CREATININE 1.17 (H) 10/18/2016 1330   CREATININE 0.90 11/04/2013 1257      Component Value Date/Time   CALCIUM 9.1 10/18/2016 1330   CALCIUM 8.5 11/04/2013 1257   ALKPHOS 30 (L) 10/18/2016 1330   ALKPHOS 64 11/04/2013 1257   AST 21 10/18/2016 1330   AST 25 11/04/2013 1257   ALT 14 10/18/2016 1330   ALT 20 11/04/2013 1257   BILITOT 0.7 10/18/2016 1330   BILITOT 0.5 11/04/2013 1257       RADIOGRAPHIC STUDIES: I have personally reviewed the radiological images as listed and agreed with the findings in the report. No results found.   ASSESSMENT & PLAN:  Endometrial cancer ([place]) Metastatic endometrial cancer to the bone- currently on Megace. Tolerating it well. July 2018- abdomen pelvis CT scan stable right iliac lesion.  Continue Megace at this time. No clinical progression noted. Will plan to get scan in end of dec 2018.   # Pain- improved. on fentanyl patch to 75/ and percocet 10 every 8-12 hours. New scripts given.   # Left lower extremity swelling chronic/DVT- continue Eliquis.  # Metastatic lesions to the bone on X-geva- every 4 weeks. Tolerating well.  Calcium normal; no concerns for osteonecrosis of the jaw. Continue ca+ vit D.   # Port flushed every 8 weeks; continue the same.   # weight loss-- improved. S/p meeting with Jolie.   # labs/X-geva- 4 weeks; port flush.  Orders Placed This Encounter  Procedures  . CBC with Differential    Standing Status:   Future    Standing Expiration Date:   10/18/2017  . Basic metabolic panel    Standing Status:   Future     Standing Expiration Date:   10/18/2017  . CA 125    Standing Status:   Future    Standing Expiration Date:   10/18/2017      Cammie Sickle, MD 11/03/2016 9:18 PM

## 2016-10-18 NOTE — Assessment & Plan Note (Addendum)
Metastatic endometrial cancer to the bone- currently on Megace. Tolerating it well. July 2018- abdomen pelvis CT scan stable right iliac lesion.  Continue Megace at this time. No clinical progression noted. Will plan to get scan in end of dec 2018.   # Pain- improved. on fentanyl patch to 75/ and percocet 10 every 8-12 hours. New scripts given.   # Left lower extremity swelling chronic/DVT- continue Eliquis.  # Metastatic lesions to the bone on X-geva- every 4 weeks. Tolerating well.  Calcium normal; no concerns for osteonecrosis of the jaw. Continue ca+ vit D.   # Port flushed every 8 weeks; continue the same.   # weight loss-- improved. S/p meeting with Jolie.   # labs/X-geva- 4 weeks; port flush.

## 2016-10-26 ENCOUNTER — Telehealth: Payer: Self-pay

## 2016-10-26 NOTE — Telephone Encounter (Signed)
Nutrition Follow-up:  Spoke with Mr Rothschild, patient's husband via phone this am for nutrition follow-up.    Patient with left breast cancer and endometrial cancer in 2016.  Patient with metastatic disease and taking megace.  Followed by Dr. Jacinto Reap.    Husband reports that patient is drinking boost plus 2 times per day.  Reports patient usually is eating 3 meals per day but may eat 1/2 of portion size.  Reports ate 1 pancake yesterday am for breakfast, 1/2 cheeseburger for lunch and 1/2 steak and sweet potato for dinner last night.  Patient reports patient is snacking on nuts and peanut butter and crackers (not prepackaged but makes her own) and ice cream every night before bed.     Medications: reviewed   Labs: reviewed  Anthropometrics:   Noted weight on 9/12 118 lb, 8/22 122 lb, 8/15 121 lb, 7/18 111 lb 9.6 oz (question accuracy), 7/9 118 lb, 6/15 118 lb.   NUTRITION DIAGNOSIS: Inadequate oral intake stable   MALNUTRITION DIAGNOSIS: continue to monitor   INTERVENTION:   Reviewed strategies to increase calories and protein with husband via phone. Husband reports that he is trying to add high calorie foods to patient's diet.   Encouraged husband to provide high calorie, high protein oral nutrition supplements Husband has contact information and will reach out to me with further questions or concerns.    MONITORING, EVALUATION, GOAL: Patient will consume adequate calories and protein to prevent weight loss   NEXT VISIT: as needed, patient to contact  Karlin Binion B. Zenia Resides, [place], [place] Registered Dietitian [phone] (pager)

## 2016-11-15 ENCOUNTER — Inpatient Hospital Stay: Payer: Medicare Other

## 2016-11-15 ENCOUNTER — Inpatient Hospital Stay: Payer: Medicare Other | Attending: Internal Medicine | Admitting: Internal Medicine

## 2016-11-15 VITALS — BP 120/68 | HR 60 | Temp 97.5°F | Resp 20 | Ht 60.0 in | Wt 120.0 lb

## 2016-11-15 DIAGNOSIS — Z9012 Acquired absence of left breast and nipple: Secondary | ICD-10-CM | POA: Diagnosis not present

## 2016-11-15 DIAGNOSIS — Z95828 Presence of other vascular implants and grafts: Secondary | ICD-10-CM | POA: Diagnosis not present

## 2016-11-15 DIAGNOSIS — R5383 Other fatigue: Secondary | ICD-10-CM | POA: Insufficient documentation

## 2016-11-15 DIAGNOSIS — Z8541 Personal history of malignant neoplasm of cervix uteri: Secondary | ICD-10-CM | POA: Diagnosis not present

## 2016-11-15 DIAGNOSIS — Z9221 Personal history of antineoplastic chemotherapy: Secondary | ICD-10-CM | POA: Insufficient documentation

## 2016-11-15 DIAGNOSIS — M1990 Unspecified osteoarthritis, unspecified site: Secondary | ICD-10-CM | POA: Insufficient documentation

## 2016-11-15 DIAGNOSIS — Z86718 Personal history of other venous thrombosis and embolism: Secondary | ICD-10-CM | POA: Diagnosis not present

## 2016-11-15 DIAGNOSIS — Z7902 Long term (current) use of antithrombotics/antiplatelets: Secondary | ICD-10-CM | POA: Insufficient documentation

## 2016-11-15 DIAGNOSIS — Z79899 Other long term (current) drug therapy: Secondary | ICD-10-CM | POA: Diagnosis not present

## 2016-11-15 DIAGNOSIS — C7911 Secondary malignant neoplasm of bladder: Secondary | ICD-10-CM | POA: Insufficient documentation

## 2016-11-15 DIAGNOSIS — C541 Malignant neoplasm of endometrium: Secondary | ICD-10-CM

## 2016-11-15 DIAGNOSIS — Z8673 Personal history of transient ischemic attack (TIA), and cerebral infarction without residual deficits: Secondary | ICD-10-CM | POA: Insufficient documentation

## 2016-11-15 DIAGNOSIS — R531 Weakness: Secondary | ICD-10-CM | POA: Diagnosis not present

## 2016-11-15 DIAGNOSIS — Z90722 Acquired absence of ovaries, bilateral: Secondary | ICD-10-CM | POA: Diagnosis not present

## 2016-11-15 DIAGNOSIS — C50812 Malignant neoplasm of overlapping sites of left female breast: Secondary | ICD-10-CM | POA: Insufficient documentation

## 2016-11-15 DIAGNOSIS — Z90710 Acquired absence of both cervix and uterus: Secondary | ICD-10-CM | POA: Diagnosis not present

## 2016-11-15 DIAGNOSIS — C7951 Secondary malignant neoplasm of bone: Secondary | ICD-10-CM

## 2016-11-15 DIAGNOSIS — Z923 Personal history of irradiation: Secondary | ICD-10-CM | POA: Diagnosis not present

## 2016-11-15 DIAGNOSIS — Z7901 Long term (current) use of anticoagulants: Secondary | ICD-10-CM | POA: Diagnosis not present

## 2016-11-15 DIAGNOSIS — M25551 Pain in right hip: Secondary | ICD-10-CM | POA: Insufficient documentation

## 2016-11-15 DIAGNOSIS — Z86010 Personal history of colonic polyps: Secondary | ICD-10-CM | POA: Insufficient documentation

## 2016-11-15 DIAGNOSIS — Z170 Estrogen receptor positive status [ER+]: Secondary | ICD-10-CM | POA: Insufficient documentation

## 2016-11-15 DIAGNOSIS — N189 Chronic kidney disease, unspecified: Secondary | ICD-10-CM | POA: Insufficient documentation

## 2016-11-15 DIAGNOSIS — K219 Gastro-esophageal reflux disease without esophagitis: Secondary | ICD-10-CM | POA: Diagnosis not present

## 2016-11-15 DIAGNOSIS — Z8619 Personal history of other infectious and parasitic diseases: Secondary | ICD-10-CM | POA: Insufficient documentation

## 2016-11-15 DIAGNOSIS — R42 Dizziness and giddiness: Secondary | ICD-10-CM | POA: Diagnosis not present

## 2016-11-15 DIAGNOSIS — Z880 Allergy status to penicillin: Secondary | ICD-10-CM | POA: Diagnosis not present

## 2016-11-15 DIAGNOSIS — Z803 Family history of malignant neoplasm of breast: Secondary | ICD-10-CM | POA: Insufficient documentation

## 2016-11-15 LAB — BASIC METABOLIC PANEL
Anion gap: 9 (ref 5–15)
BUN: 23 mg/dL — AB (ref 6–20)
CHLORIDE: 104 mmol/L (ref 101–111)
CO2: 23 mmol/L (ref 22–32)
Calcium: 9.1 mg/dL (ref 8.9–10.3)
Creatinine, Ser: 1.03 mg/dL — ABNORMAL HIGH (ref 0.44–1.00)
GFR calc Af Amer: 56 mL/min — ABNORMAL LOW (ref >60)
GFR calc non Af Amer: 48 mL/min — ABNORMAL LOW (ref >60)
GLUCOSE: 93 mg/dL (ref 65–99)
POTASSIUM: 4.2 mmol/L (ref 3.5–5.1)
Sodium: 136 mmol/L (ref 135–145)

## 2016-11-15 LAB — CBC WITH DIFFERENTIAL/PLATELET
Basophils Absolute: 0.1 10*3/uL (ref 0–0.1)
Basophils Relative: 1 %
EOS PCT: 4 %
Eosinophils Absolute: 0.2 10*3/uL (ref 0–0.7)
HCT: 41.3 % (ref 35.0–47.0)
HEMOGLOBIN: 13.9 g/dL (ref 12.0–16.0)
LYMPHS ABS: 1.3 10*3/uL (ref 1.0–3.6)
LYMPHS PCT: 32 %
MCH: 31.6 pg (ref 26.0–34.0)
MCHC: 33.7 g/dL (ref 32.0–36.0)
MCV: 93.6 fL (ref 80.0–100.0)
Monocytes Absolute: 0.5 10*3/uL (ref 0.2–0.9)
Monocytes Relative: 12 %
Neutro Abs: 2.1 10*3/uL (ref 1.4–6.5)
Neutrophils Relative %: 51 %
PLATELETS: 230 10*3/uL (ref 150–440)
RBC: 4.41 MIL/uL (ref 3.80–5.20)
RDW: 13.2 % (ref 11.5–14.5)
WBC: 4 10*3/uL (ref 3.6–11.0)

## 2016-11-15 MED ORDER — SODIUM CHLORIDE 0.9% FLUSH
10.0000 mL | INTRAVENOUS | Status: DC | PRN
  Administered 2016-11-15: 10 mL via INTRAVENOUS
  Filled 2016-11-15: qty 10

## 2016-11-15 MED ORDER — LIDOCAINE-PRILOCAINE 2.5-2.5 % EX CREA: 1.0000 "application " | TOPICAL_CREAM | CUTANEOUS | 3 refills | Status: AC | PRN

## 2016-11-15 MED ORDER — FENTANYL 75 MCG/HR TD PT72: 75.0000 ug | MEDICATED_PATCH | TRANSDERMAL | 0 refills | Status: DC

## 2016-11-15 MED ORDER — HEPARIN SOD (PORK) LOCK FLUSH 100 UNIT/ML IV SOLN
500.0000 [IU] | Freq: Once | INTRAVENOUS | Status: AC
  Administered 2016-11-15: 500 [IU] via INTRAVENOUS

## 2016-11-15 MED ORDER — DENOSUMAB 120 MG/1.7ML ~~LOC~~ SOLN
120.0000 mg | Freq: Once | SUBCUTANEOUS | Status: AC
  Administered 2016-11-15: 120 mg via SUBCUTANEOUS
  Filled 2016-11-15: qty 1.7

## 2016-11-15 NOTE — Progress Notes (Signed)
[facility] OFFICE PROGRESS NOTE  Patient Care Team: Idelle Crouch, MD as PCP - General (Unknown Physician Specialty) [name], MD as Referring Physician (Surgery) [name], MD as Consulting Physician (Urology)  Cancer of left breast [facility])   Staging form: Breast, AJCC 7th Edition     Clinical: Stage IA (T1b, N0, M0) - Signed by Forest Gleason, MD on 10/20/2014    Oncology History   . Patient has a previous history of endometrium carcinoma of endometrium stage IB disease status post bilateral salpingo-oophorectomy and radiation therapy 4. Right hydronephrosis detected on MRI scan off lumbosacral spine. Cystoscopy with attempted stent placement revealed bladder tumor in September of 2016 biopsies positive for recurrent endometrial cancer  5. Patient had stent placement in the right kidney Started radiation therapy 6. Patient has finished radiation therapy with relief in the pain (November, 2016) 7. Started on Megace in December of 2016 8 deep vein thrombosis in the left lower extremity (February 08, 2014) Hematuria due to xeralto IVC filter placement on February 11, 2015 She is off xeralto 9.condition recently had a cystoscopy no evidence of bleeding was found so patient has been started on ELOQUIS (March 15, 2015)  10. Patient again had a significant bleeding with hematuria with eloquis and has been discontinued; Patient is on Plavix. ON  Megace. AUG 2017- PET IMPROVED; mediastinal uptake- monitor for now.   # August 25 2015- LLE DVT- start Eliquis [stop plavix]; PET  FEB 2017-LEFT BREAST CA [9'0 clock- overlapping site] Estrogen receptor positive. Progesterone receptor positive. HER-2 receptor; Her 2neg; 5 mm tumor clinically stage IB N0 M0 tumor;s/p Lumpectomy; NO RT  # PET NOV 27th- STABLE right iliac lesions [increasing pain]/ right paratrac- refer to RT  # X-geva q 4 w  # MOLECULAR STUDIES: 08/23/15- MMR-STABLE.       Cancer of left breast ([place])   10/20/2014 Initial Diagnosis    Cancer of left breast       Endometrial cancer ([place])   11/18/2014 Initial Diagnosis    Endometrial cancer ([place])        INTERVAL HISTORY:  Traci [name] 83 y.o.  female pleasant patient above history of Metastatic endometrial cancer currently on Megace- Is here for follow-up/ Accompanied by her husband.  Patient stated that she has intermittent episodes of generalized weakness that last for 3-4 days; up to 7 days. These episodes happen once a month. Patient feels dizzy/significantly fatigued. These resolved by itself.   Patient continues to have right hip pain; currently stable on fentanyl patch. She is also needing to take breakthrough pain medication up to 2 times a day.   No new shortness of breath or cough. No falls.  REVIEW OF SYSTEMS:  A complete 10 point review of system is done which is negative except mentioned above/history of present illness.   PAST MEDICAL HISTORY :  Past Medical History:  Diagnosis Date  . Arthritis    Degenerative Disc Disease  . Bone cancer ([place])   . Breast cancer ([place]) 2016   LT LUMPECTOMY 03-2015  . Chronic kidney disease    HYDRONEPHROSIS  . Deep vein blood clot of left lower extremity ([place]) February 11, 2015   Left Leg  . Endometrial adenocarcinoma ([place]) 01/2012    stage Ib, grade 1, positive peritoneal cytology, no other risk factors or extra-uterine disease  . Gastric ulcer   . GERD (gastroesophageal reflux disease)   . Hearing loss   . Hiccups   .  History of brachytherapy   . History of colon polyps   . History of DVT (deep vein thrombosis)   . History of esophageal stricture   . History of shingles   . Inguinal lymphocyst   . Mild aphasia   . Pain    SEVERE CHRONIC RIGHT LEG,HIP,FOOT  . PE (pulmonary embolism)    H/O  . Stroke ([place]) 04/06/2011   residual:  aphasia    PAST SURGICAL HISTORY :   Past Surgical History:  Procedure Laterality Date  . ABDOMINAL  HYSTERECTOMY  December 2013   history of cervical cancer  . BACK SURGERY     fusion of L3&4  . BREAST BIOPSY Left 10/13/2014   POS  . BREAST LUMPECTOMY Left 03/2015  . CATARACT EXTRACTION W/ INTRAOCULAR LENS IMPLANT     right  . CYSTOSCOPY W/ RETROGRADES Right 10/28/2014   Procedure: CYSTOSCOPY WITH RETROGRADE PYELOGRAM ATTEMPT, UNABLE TO PASS ;  Surgeon: [name], MD;  Location: [facility];  Service: Urology;  Laterality: Right;  . CYSTOSCOPY W/ RETROGRADES Right 09/15/2015   Procedure: CYSTOSCOPY WITH RETROGRADE PYELOGRAM;  Surgeon: [name], MD;  Location: [facility];  Service: Urology;  Laterality: Right;  . CYSTOSCOPY W/ URETERAL STENT PLACEMENT Right 03/10/2015   Procedure: CYSTOSCOPY WITH STENT REPLACEMENT;  Surgeon: [name], MD;  Location: [facility];  Service: Urology;  Laterality: Right;  . CYSTOSCOPY W/ URETERAL STENT PLACEMENT Right 06/09/2015   Procedure: CYSTOSCOPY WITH STENT REPLACEMENT;  Surgeon: [name], MD;  Location: [facility];  Service: Urology;  Laterality: Right;  . CYSTOSCOPY W/ URETERAL STENT PLACEMENT Right 09/15/2015   Procedure: CYSTOSCOPY WITH STENT REPLACEMENT;  Surgeon: [name], MD;  Location: [facility];  Service: Urology;  Laterality: Right;  . CYSTOSCOPY WITH STENT PLACEMENT Right 10/28/2014   Procedure: BLADDER BIOPSY WITH FULGERATION ;  Surgeon: [name], MD;  Location: [facility];  Service: Urology;  Laterality: Right;  . ESOPHAGEAL DILATION    . EYE SURGERY Bilateral    Cataract Extraction  . PARTIAL MASTECTOMY WITH NEEDLE LOCALIZATION Left 03/30/2015   Procedure: PARTIAL MASTECTOMY WITH NEEDLE LOCALIZATION;  Surgeon: [name], MD;  Location: [facility];  Service: General;  Laterality: Left;  . PERIPHERAL VASCULAR CATHETERIZATION N/A 02/11/2015   Procedure: IVC Filter Insertion;  Surgeon: Algernon Huxley, MD;  Location: [facility];  Service: Cardiovascular;  Laterality: N/A;  . PORTACATH PLACEMENT  Right 11/26/2014   Procedure: INSERTION PORT-A-CATH;  Surgeon: [name], MD;  Location: [facility];  Service: General;  Laterality: Right;  . SENTINEL NODE BIOPSY Left 03/30/2015   Procedure: SENTINEL NODE BIOPSY;  Surgeon: [name], MD;  Location: [facility];  Service: General;  Laterality: Left;  . thyroid removed    . THYROIDECTOMY, PARTIAL    . TONSILLECTOMY    . TOTAL ABDOMINAL HYSTERECTOMY W/ BILATERAL SALPINGOOPHORECTOMY Bilateral    staging biopsies    FAMILY HISTORY :   Family History  Problem Relation Age of Onset  . Heart attack Mother   . Breast cancer Maternal Aunt        x 2    SOCIAL HISTORY:   Social History  Substance Use Topics  . Smoking status: Never Smoker  . Smokeless tobacco: Never Used  . Alcohol use No    ALLERGIES:  is allergic to penicillins; acetaminophen; hydrocodone-acetaminophen; novocain [procaine]; and sulfa antibiotics.  MEDICATIONS:  Current Outpatient Prescriptions  Medication Sig Dispense Refill  . apixaban (ELIQUIS)  5 MG TABS tablet Take 1 tablet (5 mg total) by mouth 2 (two) times daily. 60 tablet 0  . gabapentin (NEURONTIN) 300 MG capsule Take 1 capsule (300 mg total) by mouth 3 (three) times daily. 90 capsule 2  . megestrol (MEGACE) 40 MG tablet Take 40 mg by mouth 4 (four) times daily.    . Multiple Vitamins-Minerals (CENTRUM SILVER) tablet Take 1 tablet by mouth daily.    Marland Kitchen oxyCODONE-acetaminophen (PERCOCET) 10-325 MG tablet Take 1 tablet by mouth 3 (three) times daily as needed for pain. 90 tablet 0  . [START ON 11/27/2016] fentaNYL (DURAGESIC - DOSED MCG/HR) 75 MCG/HR Place 1 patch (75 mcg total) onto the skin every 3 (three) days. 10 patch 0  . lidocaine-prilocaine (EMLA) cream Apply 1 application topically as needed. 30 g 3   No current facility-administered medications for this visit.     PHYSICAL EXAMINATION: ECOG PERFORMANCE STATUS: 1 - Symptomatic but completely ambulatory  BP 120/68 (Patient Position:  Sitting)   Pulse 60   Temp (!) 97.5 F (36.4 C) (Tympanic)   Resp 20   Ht 5' (1.524 m)   Wt 120 lb (54.4 kg)   BMI 23.44 kg/m   Filed Weights   11/15/16 1502  Weight: 120 lb (54.4 kg)    GENERAL: [place] Caucasian female patient walking herself Alert, no distress and comfortable. She Is accompanied by her husband.  EYES: no pallor or icterus OROPHARYNX: no thrush or ulceration; good dentition  NECK: supple, no masses felt LYMPH:  no palpable lymphadenopathy in the cervical, axillary or inguinal regions LUNGS: clear to auscultation and  No wheeze or crackles HEART/CVS: regular rate & rhythm and no murmurs; Left lower extremity swollen compared to the right.  ABDOMEN:abdomen soft, non-tender and normal bowel sounds Musculoskeletal:no cyanosis of digits and no clubbing  PSYCH: alert & oriented x 3 with fluent speech NEURO: no focal motor/sensory deficits SKIN:  no rashes or significant lesions  LABORATORY DATA:  I have reviewed the data as listed    Component Value Date/Time   NA 136 11/15/2016 1438   NA 139 11/04/2013 1257   K 4.2 11/15/2016 1438   K 4.3 03/05/2014 1156   CL 104 11/15/2016 1438   CL 106 11/04/2013 1257   CO2 23 11/15/2016 1438   CO2 26 11/04/2013 1257   GLUCOSE 93 11/15/2016 1438   GLUCOSE 96 11/04/2013 1257   BUN 23 (H) 11/15/2016 1438   BUN 13 11/04/2013 1257   CREATININE 1.03 (H) 11/15/2016 1438   CREATININE 0.90 11/04/2013 1257   CALCIUM 9.1 11/15/2016 1438   CALCIUM 8.5 11/04/2013 1257   PROT 6.7 10/18/2016 1330   PROT 6.8 11/04/2013 1257   ALBUMIN 3.9 10/18/2016 1330   ALBUMIN 3.5 11/04/2013 1257   AST 21 10/18/2016 1330   AST 25 11/04/2013 1257   ALT 14 10/18/2016 1330   ALT 20 11/04/2013 1257   ALKPHOS 30 (L) 10/18/2016 1330   ALKPHOS 64 11/04/2013 1257   BILITOT 0.7 10/18/2016 1330   BILITOT 0.5 11/04/2013 1257   GFRNONAA 48 (L) 11/15/2016 1438   GFRNONAA >60 11/04/2013 1257   GFRNONAA >60 03/29/2012 1650   GFRAA 56 (L)  11/15/2016 1438   GFRAA >60 11/04/2013 1257   GFRAA >60 03/29/2012 1650    No results found for: SPEP, UPEP  Lab Results  Component Value Date   WBC 4.0 11/15/2016   NEUTROABS 2.1 11/15/2016   HGB 13.9 11/15/2016   HCT 41.3 11/15/2016   MCV  93.6 11/15/2016   PLT 230 11/15/2016      Chemistry      Component Value Date/Time   NA 136 11/15/2016 1438   NA 139 11/04/2013 1257   K 4.2 11/15/2016 1438   K 4.3 03/05/2014 1156   CL 104 11/15/2016 1438   CL 106 11/04/2013 1257   CO2 23 11/15/2016 1438   CO2 26 11/04/2013 1257   BUN 23 (H) 11/15/2016 1438   BUN 13 11/04/2013 1257   CREATININE 1.03 (H) 11/15/2016 1438   CREATININE 0.90 11/04/2013 1257      Component Value Date/Time   CALCIUM 9.1 11/15/2016 1438   CALCIUM 8.5 11/04/2013 1257   ALKPHOS 30 (L) 10/18/2016 1330   ALKPHOS 64 11/04/2013 1257   AST 21 10/18/2016 1330   AST 25 11/04/2013 1257   ALT 14 10/18/2016 1330   ALT 20 11/04/2013 1257   BILITOT 0.7 10/18/2016 1330   BILITOT 0.5 11/04/2013 1257       RADIOGRAPHIC STUDIES: I have personally reviewed the radiological images as listed and agreed with the findings in the report. No results found.   ASSESSMENT & PLAN:  Endometrial cancer ([place]) Metastatic endometrial cancer to the bone- currently on Megace. Tolerating it well. July 2018- abdomen pelvis CT scan stable right iliac lesion.  Continue Megace at this time. No clinical progression noted. Will plan CT scan in dec 2018; will order at next visit.   # Pain- improved. on fentanyl patch to 75/ and percocet 10 every 8-12 hours. New scripts given.   # Intermittent generalized weakness- ? Etiology. April 2018-brain [place]. question adrenal insufficiency from Megace. Check cortisol level ACTH-morning.  # Left lower extremity swelling chronic/DVT- continue Eliquis.  # Metastatic lesions to the bone on X-geva- every 4 weeks. Tolerating well.  Calcium normal; no concerns for osteonecrosis of the jaw. Continue  ca+ vit D.   # Port flushed every 8 weeks; continue the same.   # labs/X-geva- 4 weeks; cortisol/ACTH in AM.  Orders Placed This Encounter  Procedures  . Cortisol    Standing Status:   Future    Standing Expiration Date:   11/15/2017  . ACTH    Standing Status:   Future    Standing Expiration Date:   11/15/2017      Cammie Sickle, MD 11/15/2016 4:16 PM

## 2016-11-15 NOTE — Assessment & Plan Note (Addendum)
Metastatic endometrial cancer to the bone- currently on Megace. Tolerating it well. July 2018- abdomen pelvis CT scan stable right iliac lesion.  Continue Megace at this time. No clinical progression noted. Will plan CT scan in dec 2018; will order at next visit.   # Pain- improved. on fentanyl patch to 75/ and percocet 10 every 8-12 hours. New scripts given.   # Intermittent generalized weakness- ? Etiology. April 2018-brain [place]. question adrenal insufficiency from Megace. Check cortisol level ACTH-morning.  # Left lower extremity swelling chronic/DVT- continue Eliquis.  # Metastatic lesions to the bone on X-geva- every 4 weeks. Tolerating well.  Calcium normal; no concerns for osteonecrosis of the jaw. Continue ca+ vit D.   # Port flushed every 8 weeks; continue the same.   # labs/X-geva- 4 weeks; cortisol/ACTH in AM.

## 2016-11-16 LAB — CA 125: CANCER ANTIGEN (CA) 125: 21.8 U/mL (ref 0.0–38.1)

## 2016-11-21 ENCOUNTER — Inpatient Hospital Stay: Payer: Medicare Other

## 2016-11-21 DIAGNOSIS — C541 Malignant neoplasm of endometrium: Secondary | ICD-10-CM | POA: Diagnosis not present

## 2016-11-21 DIAGNOSIS — R531 Weakness: Secondary | ICD-10-CM

## 2016-11-21 LAB — COMPREHENSIVE METABOLIC PANEL
ALBUMIN: 3.9 g/dL (ref 3.5–5.0)
ALK PHOS: 33 U/L — AB (ref 38–126)
ALT: 14 U/L (ref 14–54)
AST: 18 U/L (ref 15–41)
Anion gap: 6 (ref 5–15)
BILIRUBIN TOTAL: 0.8 mg/dL (ref 0.3–1.2)
BUN: 26 mg/dL — AB (ref 6–20)
CO2: 26 mmol/L (ref 22–32)
CREATININE: 1.08 mg/dL — AB (ref 0.44–1.00)
Calcium: 9 mg/dL (ref 8.9–10.3)
Chloride: 106 mmol/L (ref 101–111)
GFR calc Af Amer: 53 mL/min — ABNORMAL LOW (ref >60)
GFR, EST NON AFRICAN AMERICAN: 45 mL/min — AB (ref >60)
GLUCOSE: 100 mg/dL — AB (ref 65–99)
Potassium: 4 mmol/L (ref 3.5–5.1)
Sodium: 138 mmol/L (ref 135–145)
TOTAL PROTEIN: 6.8 g/dL (ref 6.5–8.1)

## 2016-11-21 LAB — CBC WITH DIFFERENTIAL/PLATELET
BASOS ABS: 0 10*3/uL (ref 0–0.1)
BASOS PCT: 1 %
Eosinophils Absolute: 0.2 10*3/uL (ref 0–0.7)
Eosinophils Relative: 5 %
HEMATOCRIT: 40.9 % (ref 35.0–47.0)
HEMOGLOBIN: 14 g/dL (ref 12.0–16.0)
LYMPHS PCT: 30 %
Lymphs Abs: 1.2 10*3/uL (ref 1.0–3.6)
MCH: 32.2 pg (ref 26.0–34.0)
MCHC: 34.2 g/dL (ref 32.0–36.0)
MCV: 94.1 fL (ref 80.0–100.0)
MONO ABS: 0.4 10*3/uL (ref 0.2–0.9)
Monocytes Relative: 11 %
NEUTROS ABS: 2 10*3/uL (ref 1.4–6.5)
NEUTROS PCT: 53 %
Platelets: 204 10*3/uL (ref 150–440)
RBC: 4.35 MIL/uL (ref 3.80–5.20)
RDW: 13.2 % (ref 11.5–14.5)
WBC: 3.9 10*3/uL (ref 3.6–11.0)

## 2016-11-21 LAB — CORTISOL: CORTISOL PLASMA: 5.2 ug/dL

## 2016-11-22 LAB — ACTH: C206 ACTH: 8.4 pg/mL (ref 7.2–63.3)

## 2016-12-13 ENCOUNTER — Other Ambulatory Visit: Payer: Self-pay

## 2016-12-13 ENCOUNTER — Inpatient Hospital Stay: Payer: Medicare Other | Attending: Internal Medicine | Admitting: Internal Medicine

## 2016-12-13 ENCOUNTER — Inpatient Hospital Stay: Payer: Medicare Other

## 2016-12-13 ENCOUNTER — Encounter: Payer: Self-pay | Admitting: Internal Medicine

## 2016-12-13 VITALS — BP 124/71 | HR 56 | Temp 97.8°F | Resp 18 | Ht 60.0 in | Wt 119.4 lb

## 2016-12-13 DIAGNOSIS — R531 Weakness: Secondary | ICD-10-CM | POA: Diagnosis not present

## 2016-12-13 DIAGNOSIS — Z170 Estrogen receptor positive status [ER+]: Secondary | ICD-10-CM | POA: Diagnosis not present

## 2016-12-13 DIAGNOSIS — Z8541 Personal history of malignant neoplasm of cervix uteri: Secondary | ICD-10-CM | POA: Diagnosis not present

## 2016-12-13 DIAGNOSIS — E2740 Unspecified adrenocortical insufficiency: Secondary | ICD-10-CM | POA: Insufficient documentation

## 2016-12-13 DIAGNOSIS — C50812 Malignant neoplasm of overlapping sites of left female breast: Secondary | ICD-10-CM | POA: Insufficient documentation

## 2016-12-13 DIAGNOSIS — Z803 Family history of malignant neoplasm of breast: Secondary | ICD-10-CM | POA: Insufficient documentation

## 2016-12-13 DIAGNOSIS — Z7901 Long term (current) use of anticoagulants: Secondary | ICD-10-CM

## 2016-12-13 DIAGNOSIS — R110 Nausea: Secondary | ICD-10-CM | POA: Insufficient documentation

## 2016-12-13 DIAGNOSIS — Z86718 Personal history of other venous thrombosis and embolism: Secondary | ICD-10-CM | POA: Insufficient documentation

## 2016-12-13 DIAGNOSIS — Z9012 Acquired absence of left breast and nipple: Secondary | ICD-10-CM | POA: Diagnosis not present

## 2016-12-13 DIAGNOSIS — R42 Dizziness and giddiness: Secondary | ICD-10-CM | POA: Insufficient documentation

## 2016-12-13 DIAGNOSIS — N189 Chronic kidney disease, unspecified: Secondary | ICD-10-CM | POA: Diagnosis not present

## 2016-12-13 DIAGNOSIS — Z79899 Other long term (current) drug therapy: Secondary | ICD-10-CM | POA: Insufficient documentation

## 2016-12-13 DIAGNOSIS — Z90722 Acquired absence of ovaries, bilateral: Secondary | ICD-10-CM | POA: Diagnosis not present

## 2016-12-13 DIAGNOSIS — M25551 Pain in right hip: Secondary | ICD-10-CM | POA: Insufficient documentation

## 2016-12-13 DIAGNOSIS — Z9221 Personal history of antineoplastic chemotherapy: Secondary | ICD-10-CM | POA: Diagnosis not present

## 2016-12-13 DIAGNOSIS — Z86711 Personal history of pulmonary embolism: Secondary | ICD-10-CM | POA: Diagnosis not present

## 2016-12-13 DIAGNOSIS — C7951 Secondary malignant neoplasm of bone: Secondary | ICD-10-CM

## 2016-12-13 DIAGNOSIS — C541 Malignant neoplasm of endometrium: Secondary | ICD-10-CM | POA: Insufficient documentation

## 2016-12-13 DIAGNOSIS — Z880 Allergy status to penicillin: Secondary | ICD-10-CM | POA: Diagnosis not present

## 2016-12-13 DIAGNOSIS — Z923 Personal history of irradiation: Secondary | ICD-10-CM | POA: Insufficient documentation

## 2016-12-13 DIAGNOSIS — Z86010 Personal history of colonic polyps: Secondary | ICD-10-CM | POA: Insufficient documentation

## 2016-12-13 DIAGNOSIS — I82502 Chronic embolism and thrombosis of unspecified deep veins of left lower extremity: Secondary | ICD-10-CM | POA: Insufficient documentation

## 2016-12-13 DIAGNOSIS — K219 Gastro-esophageal reflux disease without esophagitis: Secondary | ICD-10-CM | POA: Insufficient documentation

## 2016-12-13 DIAGNOSIS — Z8619 Personal history of other infectious and parasitic diseases: Secondary | ICD-10-CM | POA: Insufficient documentation

## 2016-12-13 DIAGNOSIS — Z8673 Personal history of transient ischemic attack (TIA), and cerebral infarction without residual deficits: Secondary | ICD-10-CM | POA: Insufficient documentation

## 2016-12-13 DIAGNOSIS — Z79818 Long term (current) use of other agents affecting estrogen receptors and estrogen levels: Secondary | ICD-10-CM | POA: Diagnosis not present

## 2016-12-13 DIAGNOSIS — C7911 Secondary malignant neoplasm of bladder: Secondary | ICD-10-CM | POA: Insufficient documentation

## 2016-12-13 LAB — CBC WITH DIFFERENTIAL/PLATELET
Basophils Absolute: 0 10*3/uL (ref 0–0.1)
Basophils Relative: 1 %
EOS ABS: 0.2 10*3/uL (ref 0–0.7)
EOS PCT: 3 %
HCT: 39 % (ref 35.0–47.0)
Hemoglobin: 13.5 g/dL (ref 12.0–16.0)
LYMPHS ABS: 1.1 10*3/uL (ref 1.0–3.6)
Lymphocytes Relative: 21 %
MCH: 32.2 pg (ref 26.0–34.0)
MCHC: 34.7 g/dL (ref 32.0–36.0)
MCV: 92.8 fL (ref 80.0–100.0)
MONO ABS: 0.6 10*3/uL (ref 0.2–0.9)
Monocytes Relative: 11 %
Neutro Abs: 3.4 10*3/uL (ref 1.4–6.5)
Neutrophils Relative %: 64 %
PLATELETS: 249 10*3/uL (ref 150–440)
RBC: 4.2 MIL/uL (ref 3.80–5.20)
RDW: 13.3 % (ref 11.5–14.5)
WBC: 5.3 10*3/uL (ref 3.6–11.0)

## 2016-12-13 LAB — COMPREHENSIVE METABOLIC PANEL
ALBUMIN: 3.6 g/dL (ref 3.5–5.0)
ALT: 14 U/L (ref 14–54)
AST: 18 U/L (ref 15–41)
Alkaline Phosphatase: 32 U/L — ABNORMAL LOW (ref 38–126)
Anion gap: 7 (ref 5–15)
BILIRUBIN TOTAL: 0.6 mg/dL (ref 0.3–1.2)
BUN: 27 mg/dL — AB (ref 6–20)
CHLORIDE: 106 mmol/L (ref 101–111)
CO2: 25 mmol/L (ref 22–32)
CREATININE: 1.06 mg/dL — AB (ref 0.44–1.00)
Calcium: 8.8 mg/dL — ABNORMAL LOW (ref 8.9–10.3)
GFR calc Af Amer: 54 mL/min — ABNORMAL LOW (ref >60)
GFR, EST NON AFRICAN AMERICAN: 47 mL/min — AB (ref >60)
GLUCOSE: 98 mg/dL (ref 65–99)
Potassium: 3.8 mmol/L (ref 3.5–5.1)
Sodium: 138 mmol/L (ref 135–145)
TOTAL PROTEIN: 6.6 g/dL (ref 6.5–8.1)

## 2016-12-13 MED ORDER — DEXAMETHASONE 2 MG PO TABS: ORAL_TABLET | ORAL | 1 refills | Status: DC

## 2016-12-13 MED ORDER — DENOSUMAB 120 MG/1.7ML ~~LOC~~ SOLN
120.0000 mg | Freq: Once | SUBCUTANEOUS | Status: AC
  Administered 2016-12-13: 120 mg via SUBCUTANEOUS
  Filled 2016-12-13: qty 1.7

## 2016-12-13 NOTE — Progress Notes (Signed)
[facility] OFF[facility] PROGRESS NOTE  Patient Care Team: Idelle Crouch, MD as PCP - General (Unknown Physician Specialty) [name], MD as Referring Physician (Surgery) [name], MD as Consulting Physician (Urology)  Cancer of left breast [facility])   Staging form: Breast, AJCC 7th Edition     Clinical: Stage IA (T1b, N0, M0) - Signed by Forest Gleason, MD on 10/20/2014    Oncology History   . Patient has a previous history of endometrium carcinoma of endometrium stage IB disease status post bilateral salpingo-oophorectomy and radiation therapy 4. Right hydronephrosis detected on MRI scan off lumbosacral spine. Cystoscopy with attempted stent placement revealed bladder tumor in September of 2016 biopsies positive for recurrent endometrial cancer  5. Patient had stent placement in the right kidney Started radiation therapy 6. Patient has finished radiation therapy with relief in the pain (November, 2016) 7. Started on Megace in December of 2016 8 deep vein thrombosis in the left lower extremity (February 08, 2014) Hematuria due to xeralto IVC filter placement on February 11, 2015 She is off xeralto 9.condition recently had a cystoscopy no evidence of bleeding was found so patient has been started on ELOQUIS (March 15, 2015)  10. Patient again had a significant bleeding with hematuria with eloquis and has been discontinued; Patient is on Plavix. ON  Megace. AUG 2017- PET IMPROVED; mediastinal uptake- monitor for now.   # August 25 2015- LLE DVT- start Eliquis [stop plavix]; PET  FEB 2017-LEFT BREAST CA [9'0 clock- overlapping site] Estrogen receptor positive. Progesterone receptor positive. HER-2 receptor; Her 2neg; 5 mm tumor clinically stage IB N0 M0 tumor;s/p Lumpectomy; NO RT  # PET NOV 27th- STABLE right iliac lesions [increasing pain]/ right paratrac- refer to RT  # X-geva q 4 w  # MOLECULAR STUDIES: 08/23/15- MMR-STABLE.       Cancer of left breast ([place])   10/20/2014 Initial Diagnosis    Cancer of left breast       Endometrial cancer ([place])   11/18/2014 Initial Diagnosis    Endometrial cancer ([place])        INTERVAL HISTORY:  Traci [name] 83 y.o.  female pleasant patient above history of Metastatic endometrial cancer currently on Megace- Is here for follow-up/ Accompanied by her husband.  Patient continues to have intermittent episodes of generalized weakness with nausea every 3-4 days.  These episodes happen at least 2-3 a month. Patient feels dizzy/significantly fatigued. These resolved by itself.   Patient continues to have right hip pain; currently stable on fentanyl patch. She is also needing to take breakthrough pain medication up to 2 times a day.  No new shortness of breath or cough. No falls.  Plan to go to the beach this afternoon.  REVIEW OF SYSTEMS:  A complete 10 point review of system is done which is negative except mentioned above/history of present illness.   PAST MEDICAL HISTORY :  Past Medical History:  Diagnosis Date  . Arthritis    Degenerative Disc Disease  . Bone cancer ([place])   . Breast cancer ([place]) 2016   LT LUMPECTOMY 03-2015  . Chronic kidney disease    HYDRONEPHROSIS  . Deep vein blood clot of left lower extremity ([place]) February 11, 2015   Left Leg  . Endometrial adenocarcinoma ([place]) 01/2012    stage Ib, grade 1, positive peritoneal cytology, no other risk factors or extra-uterine disease  . Gastric ulcer   . GERD (gastroesophageal reflux disease)   . Hearing loss   .  Hiccups   . History of brachytherapy   . History of colon polyps   . History of DVT (deep vein thrombosis)   . History of esophageal stricture   . History of shingles   . Inguinal lymphocyst   . Mild aphasia   . Pain    SEVERE CHRONIC RIGHT LEG,HIP,FOOT  . PE (pulmonary embolism)    H/O  . Stroke ([place]) 04/06/2011   residual:  aphasia    PAST SURGICAL HISTORY :   Past Surgical History:   Procedure Laterality Date  . ABDOMINAL HYSTERECTOMY  December 2013   history of cervical cancer  . BACK SURGERY     fusion of L3&4  . BREAST BIOPSY Left 10/13/2014   POS  . BREAST LUMPECTOMY Left 03/2015  . CATARACT EXTRACTION W/ INTRAOCULAR LENS IMPLANT     right  . ESOPHAGEAL DILATION    . EYE SURGERY Bilateral    Cataract Extraction  . thyroid removed    . THYROIDECTOMY, PARTIAL    . TONSILLECTOMY    . TOTAL ABDOMINAL HYSTERECTOMY W/ BILATERAL SALPINGOOPHORECTOMY Bilateral    staging biopsies    FAMILY HISTORY :   Family History  Problem Relation Age of Onset  . Heart attack Mother   . Breast cancer Maternal Aunt        x 2    SOCIAL HISTORY:   Social History   Tobacco Use  . Smoking status: Never Smoker  . Smokeless tobacco: Never Used  Substance Use Topics  . Alcohol use: No    Alcohol/week: 0.0 oz  . Drug use: No    ALLERGIES:  is allergic to penicillins; acetaminophen; hydrocodone-acetaminophen; novocain [procaine]; and sulfa antibiotics.  MEDICATIONS:  Current Outpatient Medications  Medication Sig Dispense Refill  . apixaban (ELIQUIS) 5 MG TABS tablet Take 1 tablet (5 mg total) by mouth 2 (two) times daily. 60 tablet 0  . fentaNYL (DURAGESIC - DOSED MCG/HR) 75 MCG/HR Place 1 patch (75 mcg total) onto the skin every 3 (three) days. 10 patch 0  . gabapentin (NEURONTIN) 300 MG capsule Take 1 capsule (300 mg total) by mouth 3 (three) times daily. 90 capsule 2  . lidocaine-prilocaine (EMLA) cream Apply 1 application topically as needed. 30 g 3  . megestrol (MEGACE) 40 MG tablet Take 40 mg by mouth 4 (four) times daily.    . Multiple Vitamins-Minerals (CENTRUM SILVER) tablet Take 1 tablet by mouth daily.    Marland Kitchen oxyCODONE-acetaminophen (PERCOCET) 10-325 MG tablet Take 1 tablet by mouth 3 (three) times daily as needed for pain. 90 tablet 0  . dexamethasone (DECADRON) 2 MG tablet One pill a day with food 30 tablet 1   No current facility-administered medications  for this visit.     PHYSICAL EXAMINATION: ECOG PERFORMANCE STATUS: 1 - Symptomatic but completely ambulatory  BP 124/71 (BP Location: Left Arm, Patient Position: Sitting)   Pulse (!) 56   Temp 97.8 F (36.6 C) (Tympanic)   Resp 18   Ht 5' (1.524 m)   Wt 119 lb 6.4 oz (54.2 kg)   BMI 23.32 kg/m   Filed Weights   12/13/16 1029  Weight: 119 lb 6.4 oz (54.2 kg)    GENERAL: [place] Caucasian female patient walking herself Alert, no distress and comfortable. She Is accompanied by her husband.  EYES: no pallor or icterus OROPHARYNX: no thrush or ulceration; good dentition  NECK: supple, no masses felt LYMPH:  no palpable lymphadenopathy in the cervical, axillary or inguinal regions LUNGS: clear to  auscultation and  No wheeze or crackles HEART/CVS: regular rate & rhythm and no murmurs; Left lower extremity swollen compared to the right.  ABDOMEN:abdomen soft, non-tender and normal bowel sounds Musculoskeletal:no cyanosis of digits and no clubbing  PSYCH: alert & oriented x 3 with fluent speech NEURO: no focal motor/sensory deficits SKIN:  no rashes or significant lesions  LABORATORY DATA:  I have reviewed the data as listed    Component Value Date/Time   NA 138 12/13/2016 1010   NA 139 11/04/2013 1257   K 3.8 12/13/2016 1010   K 4.3 03/05/2014 1156   CL 106 12/13/2016 1010   CL 106 11/04/2013 1257   CO2 25 12/13/2016 1010   CO2 26 11/04/2013 1257   GLUCOSE 98 12/13/2016 1010   GLUCOSE 96 11/04/2013 1257   BUN 27 (H) 12/13/2016 1010   BUN 13 11/04/2013 1257   CREATININE 1.06 (H) 12/13/2016 1010   CREATININE 0.90 11/04/2013 1257   CALCIUM 8.8 (L) 12/13/2016 1010   CALCIUM 8.5 11/04/2013 1257   PROT 6.6 12/13/2016 1010   PROT 6.8 11/04/2013 1257   ALBUMIN 3.6 12/13/2016 1010   ALBUMIN 3.5 11/04/2013 1257   AST 18 12/13/2016 1010   AST 25 11/04/2013 1257   ALT 14 12/13/2016 1010   ALT 20 11/04/2013 1257   ALKPHOS 32 (L) 12/13/2016 1010   ALKPHOS 64  11/04/2013 1257   BILITOT 0.6 12/13/2016 1010   BILITOT 0.5 11/04/2013 1257   GFRNONAA 47 (L) 12/13/2016 1010   GFRNONAA >60 11/04/2013 1257   GFRNONAA >60 03/29/2012 1650   GFRAA 54 (L) 12/13/2016 1010   GFRAA >60 11/04/2013 1257   GFRAA >60 03/29/2012 1650    No results found for: SPEP, UPEP  Lab Results  Component Value Date   WBC 5.3 12/13/2016   NEUTROABS 3.4 12/13/2016   HGB 13.5 12/13/2016   HCT 39.0 12/13/2016   MCV 92.8 12/13/2016   PLT 249 12/13/2016      Chemistry      Component Value Date/Time   NA 138 12/13/2016 1010   NA 139 11/04/2013 1257   K 3.8 12/13/2016 1010   K 4.3 03/05/2014 1156   CL 106 12/13/2016 1010   CL 106 11/04/2013 1257   CO2 25 12/13/2016 1010   CO2 26 11/04/2013 1257   BUN 27 (H) 12/13/2016 1010   BUN 13 11/04/2013 1257   CREATININE 1.06 (H) 12/13/2016 1010   CREATININE 0.90 11/04/2013 1257      Component Value Date/Time   CALCIUM 8.8 (L) 12/13/2016 1010   CALCIUM 8.5 11/04/2013 1257   ALKPHOS 32 (L) 12/13/2016 1010   ALKPHOS 64 11/04/2013 1257   AST 18 12/13/2016 1010   AST 25 11/04/2013 1257   ALT 14 12/13/2016 1010   ALT 20 11/04/2013 1257   BILITOT 0.6 12/13/2016 1010   BILITOT 0.5 11/04/2013 1257       RADIOGRAPHIC STUDIES: I have personally reviewed the radiological images as listed and agreed with the findings in the report. No results found.   ASSESSMENT & PLAN:  Endometrial cancer ([place]) Metastatic endometrial cancer to the bone- currently on Megace. Tolerating it well. July 2018- abdomen pelvis CT scan stable right iliac lesion.  Continue Megace at this time. No clinical progression noted. Will plan CT scan in dec 2018; will order at next visit.   # Pain- improved. on fentanyl patch to 75/ and percocet 10 every 8-12 hours. New scripts given.   # Adrenal insuff-cortisol morning level 5;  question related to Megace.  As patient is symptomatic-recommend daily IV dexamethasone 8 mg x1; and then start Dex '2mg'$  po with  food.  Based on clinical improvement-we will transition to hydrocortisone.  # Left lower extremity swelling chronic/DVT- continue Eliquis.  # Metastatic lesions to the bone on X-geva- every 4 weeks. Tolerating well.  Calcium normal; no concerns for osteonecrosis of the jaw. Continue ca+ vit D.   # Port flushed every 8 weeks; continue the same.   # follow up in 2 weeks/ no labs.   No orders of the defined types were placed in this encounter.     Cammie Sickle, MD 12/14/2016 8:21 AM

## 2016-12-13 NOTE — Assessment & Plan Note (Addendum)
Metastatic endometrial cancer to the bone- currently on Megace. Tolerating it well. July 2018- abdomen pelvis CT scan stable right iliac lesion.  Continue Megace at this time. No clinical progression noted. Will plan CT scan in dec 2018; will order at next visit.   # Pain- improved. on fentanyl patch to 75/ and percocet 10 every 8-12 hours. New scripts given.   # Adrenal insuff-cortisol morning level 5; question related to Megace.  As patient is symptomatic-recommend daily IV dexamethasone 8 mg x1; and then start Dex 2mg  po with food.  Based on clinical improvement-we will transition to hydrocortisone.  # Left lower extremity swelling chronic/DVT- continue Eliquis.  # Metastatic lesions to the bone on X-geva- every 4 weeks. Tolerating well.  Calcium normal; no concerns for osteonecrosis of the jaw. Continue ca+ vit D.   # Port flushed every 8 weeks; continue the same.   # follow up in 2 weeks/ no labs.

## 2016-12-13 NOTE — Progress Notes (Signed)
Patient here for follow-up for endometerial cancer. Pt's husband reports that patient "almost lost her balance yesterday at home." per spouse Pt became lightheaded and dizzy and nauseated at home during this episode.

## 2016-12-18 ENCOUNTER — Other Ambulatory Visit: Payer: Self-pay | Admitting: *Deleted

## 2016-12-18 ENCOUNTER — Other Ambulatory Visit: Payer: Self-pay | Admitting: Family Medicine

## 2016-12-18 DIAGNOSIS — R2689 Other abnormalities of gait and mobility: Secondary | ICD-10-CM

## 2016-12-18 DIAGNOSIS — C541 Malignant neoplasm of endometrium: Secondary | ICD-10-CM

## 2016-12-18 DIAGNOSIS — C7951 Secondary malignant neoplasm of bone: Secondary | ICD-10-CM

## 2016-12-18 DIAGNOSIS — R42 Dizziness and giddiness: Secondary | ICD-10-CM

## 2016-12-18 MED ORDER — OXYCODONE-ACETAMINOPHEN 10-325 MG PO TABS: 1.0000 | ORAL_TABLET | Freq: Three times a day (TID) | ORAL | 0 refills | Status: DC | PRN

## 2016-12-18 MED ORDER — MEGESTROL ACETATE 40 MG PO TABS: 40.0000 mg | ORAL_TABLET | Freq: Four times a day (QID) | ORAL | 2 refills | Status: DC

## 2016-12-18 MED ORDER — FENTANYL 75 MCG/HR TD PT72: 75.0000 ug | MEDICATED_PATCH | TRANSDERMAL | 0 refills | Status: DC

## 2016-12-19 ENCOUNTER — Ambulatory Visit
Admission: RE | Admit: 2016-12-19 | Discharge: 2016-12-19 | Disposition: A | Discharge status: Home / Self Care | Payer: Medicare Other | Source: Ambulatory Visit | Attending: Family Medicine | Admitting: Family Medicine

## 2016-12-19 DIAGNOSIS — R42 Dizziness and giddiness: Secondary | ICD-10-CM | POA: Insufficient documentation

## 2016-12-19 DIAGNOSIS — R2689 Other abnormalities of gait and mobility: Secondary | ICD-10-CM | POA: Diagnosis not present

## 2016-12-25 NOTE — Progress Notes (Signed)
Gynecologic Oncology Visit   Referring Provider:  Dr. Rogue Bussing  Chief Concern: recurrent endometroid endometrial adenocarcinoma, stage Ib, grade 1 currently on Megace  Subjective:  Traci [name] is a 83 y.o. female who is seen in consultation from Dr. Oliva Bustard for recurrent endometroid endometrial adenocarcinoma, stage Ib, grade 1. Diagnosed with metastatic endometrial cancer to the bone. Stable disease on Megace therapy. She is also on X-geva- every 4 weeks, calcium, and vitamin D.   She saw Dr. Rogue Bussing on 12/13/2016 and had no evidence of clinical progress on exam. He recommended repeat scan in December 2018. She is also scheduled to see him today.  Her last scan 08/14/2016 IMPRESSION: No acute abnormality or finding to explain the patient's symptoms. No change in chronic lytic lesion in the right ilium. Sigmoid diverticulosis without diverticulitis.  She was started on IV dexamethasone 8 mg x1; followed by Dex '2mg'$  po due to adrenal insuff-cortisol morning level 5. Uncertain if this is related to Megace.   She continued on Eliquis for Left lower extremity swelling chronic/DVT.  Metastatic lesions to the bone on X-geva- every 4 weeks.  She presents today for a pelvic exam. She has no complaints of a vaginal discharge, vulvar irritation/itching. She does not have vaginal bleeding. She complains of dizziness and her husband says she is not drinking very much at all. She also complaints of fatigue.    Oncology History She was diagnosed with endometrial cancer in 12/13 and had recurrence in 2016.  01/2012  endometroid endometrial adenocarcinoma, stage Ib, grade 1, positive peritoneal cytology, no other risk factors or extra-uterine disease, TAH/BSO and staging, lymphocyst post-op, vaginal brachytherapy  In 2016 patient had abnormal mammogram of the left breast and biopsy was positive for invasive carcinoma ER+/PR+ treated with lumpectomy. Staging work up showed right hydronephrosis and  right pelvic mass invading bone and muscle.  See below.  10/28/14 Cystoscopy done for stent placement and showed tumor invading bladder.  BIOPSY: WELL-DIFFERENTIATED ADENOCARCINOMA MORPHOLOGICALLY SIMILAR TO PREVIOUS ENDOMETRIAL ADENOCARCINOMA.  The stent could not be placed so 11/13/14 Right PCN placed in radiology.  CT scan 11/09/14 Bilateral pelvic lymphadenopathy, right side greater than left, consistent with metastatic disease. Small soft tissue nodule involving the left vaginal cuff and 1.5 cm soft tissue nodule or lymphadenopathy in the sigmoid mesocolon, consistent with carcinoma. Right iliac bone metastasis with associated iliopsoas soft tissue mass. Severe right hydronephrosis and diffuse right renal parenchymal atrophy secondary to pelvic metastatic disease. No metastatic disease identified within the abdomen or chest.  She underwent external pelvic radiation and completed in 11/16 with relief in the pain,  Then started on Megace 40 mg qid in December of 2016 and developed deep vein thrombosis in the left lower extremity (February 09, 2015). Hematuria due toxeralto IVC filter placement on February 11, 2015 and xeralto stopped. Cystoscopy no evidence of bleeding, so patient has been started on ELOQUIS (March 15, 2015).  Patient again had a significant bleeding with hematuria with eloquis and was discontinued and Plavix started. June 2017- PET IMPROVED. August 25 2015- LLE DVT- start Eliquis [stop plavix];   PET/CT 09/28/15 CHEST A new 9 mm hypermetabolic mediastinal lymph node is seen in the precarinal region. This measures 9 mm on image 75/3, with SUV max of 4.5. No other hypermetabolic lymph nodes identified within the thorax. No suspicious pulmonary nodules seen on CT images. Mild emphysema and bilateral upper lobe scarring again noted.  ABDOMEN/PELVIS No abnormal hypermetabolic activity within the liver, pancreas, adrenal glands, or spleen. No  hypermetabolic lymph nodes or  other nodules seen within in abdomen or pelvis.  Previously seen soft tissue prominence along the left pelvic sidewall obturator space, and soft tissue nodularity along the left vaginal cuff are no longer visualized. No hypermetabolic peritoneal nodules identified. No evidence of ascites.  Right ureteral stent remains in appropriate position. No evidence of hydronephrosis. Diffuse right renal parenchymal atrophy again noted. IVC filter remains in place. Sigmoid diverticulosis again demonstrated, without evidence of diverticulitis. Prior hysterectomy.  SKELETON Decreased size of lytic bone lesion and associated soft tissue mass involving the right ilium. This currently measures 2.9 x 4.1 cm on image 184/3 compared to 3.4 x 4.4 cm previously. This shows low-grade metabolic activity with SUV max of 3.5. No other suspicious bone lesions identified. Lumbar spine fusion hardware again noted.  IMPRESSION: New 9 mm hypermetabolic precarinal mediastinal lymph node. Differential diagnosis includes metastatic disease and reactive/ inflammatory etiology. Recommend continued followup by chest CT with contrast in 3-6 months.  Decreased size of lytic bone metastasis involving the right ilium.  No other sites of metabolically active metastatic disease identified   MOLECULAR STUDIES: 08/23/15- MMR-ordered.  MICROSATELLITE INSTABILITY IMMUNOHISTOCHEMISTRY  MISMATCH REPAIR PROTEINS:   MLH1: Intact nuclear expression  MSH2: Intact nuclear expression  MSH6: Intact nuclear expression  PMS2: Intact nuclear expression   Interpretation: No loss of nuclear expression of mismatch repair  proteins: Low probability of MSI-H.    01/03/2016 PET/CT result below.   FINDINGS:  Hypermetabolic low right paratracheal node and right iliac wing metastasis stable.  CHEST Left thyroid is mildly hypermetabolic, as before, nonspecific. Low right paratracheal lymph node measures 7 mm (CT image 80)  with an SUV max of 4.7, stable from 09/28/2015. SKELETON An expansile lytic lesion in the right iliac wing is unchanged, measuring 3.5 x 3.7 cm and with an SUV max of 3.1, as before.     Health maintenance: Her breast cancer screening is done by her PCP, Dr. Doy Hutching, and she is scheduled for a mammogram today.   Problem List: Patient Active Problem List   Diagnosis Date Noted  . Cancer, metastatic to bone ([place]) 06/21/2016  . Pain and swelling of left lower leg 08/25/2015  . Seizure disorder ([place]) 04/06/2015  . Personal history of urinary infection 03/15/2015  . Cerebrovascular accident (CVA) ([place]) 02/18/2015  . Endometrial cancer ([place]) 11/18/2014  . Degeneration of intervertebral disc of lumbosacral region 11/14/2014  . Overflow incontinence 11/14/2014  . Pure hypercholesterolemia 11/14/2014  . Acquired hypothyroidism 10/20/2014  . DDD (degenerative disc disease), lumbosacral 10/20/2014  . Benign neoplasm of colon 10/20/2014  . Acid reflux 10/20/2014  . Esophageal stenosis 10/20/2014  . Bloodgood disease 10/20/2014  . History of colon polyps 10/20/2014  . H/O gastric ulcer 10/20/2014  . Healed or old pulmonary embolism 10/20/2014  . History of urinary anomaly 10/20/2014  . BP (high blood pressure) 10/20/2014  . Hypersomnia with sleep apnea 10/20/2014  . Multinodular goiter 10/20/2014  . Angina pectoris ([place]) 10/20/2014  . Seizure ([place]) 10/20/2014  . Digestive symptom 10/20/2014  . Incontinence overflow, urine 10/20/2014  . Dupuytren's contracture of foot 10/20/2014  . Hypercholesterolemia without hypertriglyceridemia 10/20/2014  . Cancer of left breast ([place]) 10/20/2014  . History of endometrial cancer 09/30/2014  . Lumbar radiculopathy 09/04/2014  . Deep vein thrombosis ([place]) 07/15/2013  . Deep vein thrombosis (DVT) ([place]) 07/15/2013  . Stroke [facility]) 04/07/2011    Past Medical History: Past Medical History:  Diagnosis Date  . Arthritis    Degenerative Disc  Disease  .  Bone cancer ([place])   . Breast cancer ([place]) 2016   LT LUMPECTOMY 03-2015  . Chronic kidney disease    HYDRONEPHROSIS  . Deep vein blood clot of left lower extremity ([place]) February 11, 2015   Left Leg  . Endometrial adenocarcinoma ([place]) 01/2012    stage Ib, grade 1, positive peritoneal cytology, no other risk factors or extra-uterine disease  . Gastric ulcer   . GERD (gastroesophageal reflux disease)   . Hearing loss   . Hiccups   . History of brachytherapy   . History of colon polyps   . History of DVT (deep vein thrombosis)   . History of esophageal stricture   . History of shingles   . Inguinal lymphocyst   . Mild aphasia   . Pain    SEVERE CHRONIC RIGHT LEG,HIP,FOOT  . PE (pulmonary embolism)    H/O  . Stroke [facility]) 04/06/2011   residual:  aphasia    Past Surgical History: Past Surgical History:  Procedure Laterality Date  . ABDOMINAL HYSTERECTOMY  December 2013   history of cervical cancer  . BACK SURGERY     fusion of L3&4  . BREAST BIOPSY Left 10/13/2014   POS  . BREAST LUMPECTOMY Left 03/2015  . CATARACT EXTRACTION W/ INTRAOCULAR LENS IMPLANT     right  . CYSTOSCOPY W/ RETROGRADES Right 10/28/2014   Procedure: CYSTOSCOPY WITH RETROGRADE PYELOGRAM ATTEMPT, UNABLE TO PASS ;  Surgeon: [name], MD;  Location: [facility] ORS;  Service: Urology;  Laterality: Right;  . CYSTOSCOPY W/ RETROGRADES Right 09/15/2015   Procedure: CYSTOSCOPY WITH RETROGRADE PYELOGRAM;  Surgeon: [name], MD;  Location: [facility] OR[facility];  Service: Urology;  Laterality: Right;  . CYSTOSCOPY W/ URETERAL STENT PLACEMENT Right 03/10/2015   Procedure: CYSTOSCOPY WITH STENT REPLACEMENT;  Surgeon: [name], MD;  Location: [facility] OR[facility];  Service: Urology;  Laterality: Right;  . CYSTOSCOPY W/ URETERAL STENT PLACEMENT Right 06/09/2015   Procedure: CYSTOSCOPY WITH STENT REPLACEMENT;  Surgeon: [name], MD;  Location: [facility] OR[facility];  Service: Urology;  Laterality: Right;  . CYSTOSCOPY W/ URETERAL STENT  PLACEMENT Right 09/15/2015   Procedure: CYSTOSCOPY WITH STENT REPLACEMENT;  Surgeon: [name], MD;  Location: [facility];  Service: Urology;  Laterality: Right;  . CYSTOSCOPY WITH STENT PLACEMENT Right 10/28/2014   Procedure: BLADDER BIOPSY WITH FULGERATION ;  Surgeon: [name], MD;  Location: [facility] OR[facility];  Service: Urology;  Laterality: Right;  . ESOPHAGEAL DILATION    . EYE SURGERY Bilateral    Cataract Extraction  . PARTIAL MASTECTOMY WITH NEEDLE LOCALIZATION Left 03/30/2015   Procedure: PARTIAL MASTECTOMY WITH NEEDLE LOCALIZATION;  Surgeon: [name], MD;  Location: [facility];  Service: General;  Laterality: Left;  . PERIPHERAL VASCULAR CATHETERIZATION N/A 02/11/2015   Procedure: IVC Filter Insertion;  Surgeon: Algernon Huxley, MD;  Location: [place] [facility];  Service: Cardiovascular;  Laterality: N/A;  . PORTACATH PLACEMENT Right 11/26/2014   Procedure: INSERTION PORT-A-CATH;  Surgeon: [name], MD;  Location: [facility];  Service: General;  Laterality: Right;  . SENTINEL NODE BIOPSY Left 03/30/2015   Procedure: SENTINEL NODE BIOPSY;  Surgeon: [name], MD;  Location: [facility];  Service: General;  Laterality: Left;  . thyroid removed    . THYROIDECTOMY, PARTIAL    . TONSILLECTOMY    . TOTAL ABDOMINAL HYSTERECTOMY W/ BILATERAL SALPINGOOPHORECTOMY Bilateral    staging biopsies    Past Gynecologic History:  See HPI  OB History:  OB History  Gravida Para Term Preterm AB Living  2            SAB TAB Ectopic Multiple Live Births               # Outcome Date GA Lbr Len/2nd Weight Sex Delivery Anes PTL Lv  2 Gravida           1 Gravida               Family History: Family History  Problem Relation Age of Onset  . Heart attack Mother   . Breast cancer Maternal Aunt        x 2    Social History: Social History   Socioeconomic History  . Marital status: Married    Spouse name: Not on file  . Number of children: Not on file  . Years  of education: Not on file  . Highest education level: Not on file  Social Needs  . Financial resource strain: Not on file  . Food insecurity - worry: Not on file  . Food insecurity - inability: Not on file  . Transportation needs - medical: Not on file  . Transportation needs - non-medical: Not on file  Occupational History  . Not on file  Tobacco Use  . Smoking status: Never Smoker  . Smokeless tobacco: Never Used  Substance and Sexual Activity  . Alcohol use: No    Alcohol/week: 0.0 oz  . Drug use: No  . Sexual activity: Yes    Partners: Male  Other Topics Concern  . Not on file  Social History Narrative  . Not on file    Allergies: Allergies  Allergen Reactions  . Penicillins Rash    .Has patient had a PCN reaction causing immediate rash, facial/tongue/throat swelling, SOB or lightheadedness with hypotension: No Has patient had a PCN reaction causing severe rash involving mucus membranes or skin necrosis: No Has patient had a PCN reaction that required hospitalization: No Has patient had a PCN reaction occurring within the last 10 years: Unknown If all of the above answers are "NO", then may proceed with Cephalosporin use.   . Acetaminophen     Other reaction(s): Unknown  . Hydrocodone-Acetaminophen Nausea Only  . Novocain [Procaine] Other (See Comments) and Nausea And Vomiting    Other reaction(s): Unknown Chest pain  . Sulfa Antibiotics Other (See Comments)    CHEST PAIN    Current Medications: Current Outpatient Medications  Medication Sig Dispense Refill  . apixaban (ELIQUIS) 5 MG TABS tablet Take 1 tablet (5 mg total) by mouth 2 (two) times daily. 60 tablet 0  . dexamethasone (DECADRON) 2 MG tablet One pill a day with food 30 tablet 1  . fentaNYL (DURAGESIC - DOSED MCG/HR) 75 MCG/HR Place 1 patch (75 mcg total) every 3 (three) days onto the skin. 10 patch 0  . gabapentin (NEURONTIN) 300 MG capsule Take 1 capsule (300 mg total) by mouth 3 (three) times daily.  90 capsule 2  . lidocaine-prilocaine (EMLA) cream Apply 1 application topically as needed. 30 g 3  . meclizine (ANTIVERT) 12.5 MG tablet Take by mouth.    . megestrol (MEGACE) 40 MG tablet Take 1 tablet (40 mg total) 4 (four) times daily by mouth. 120 tablet 2  . Multiple Vitamins-Minerals (CENTRUM SILVER) tablet Take 1 tablet by mouth daily.    Marland Kitchen oxyCODONE-acetaminophen (PERCOCET) 10-325 MG tablet Take 1 tablet 3 (three) times daily as needed by mouth for pain.  90 tablet 0   No current facility-administered medications for this visit.     Review of Systems - as per interval history General: fatigue  HEENT: no complaints  Lungs: no complaints  Cardiac: no complaints  GI: no complaints  GU: no complaints  Musculoskeletal: h/o back and right hip pain  Extremities: no complaints  Skin: no complaints  Neuro: neuropathic pain in her RLE due to back issues in the past - no complaints today  Endocrine: no complaints  Psych: h/o feeling sad but no complaints today      Objective:  Physical Examination:  BP 139/68 (BP Location: Right Arm, Patient Position: Sitting)   Pulse 60   Temp 99.2 F (37.3 C) (Tympanic)   Resp 18   Ht 5' (1.524 m)   Wt 116 lb 4.8 oz (52.8 kg)   BMI 22.71 kg/m    Orthostatics: Laying- 149/73 pulse 72 Sitting- 139/68 pulse 60 Standing- 123/75  Pulse 83     ECOG Performance Status: 1 - Symptomatic but completely ambulatory  General appearance: alert and appears stated age HEENT:PERRLA, extra ocular movement intact and sclera clear, anicteric Lymph node survey: non-palpable inguinal nodes Abdomen: soft, non-tender, without masses or organomegaly, no hernias and well healed incision Extremities: extremities normal, atraumatic, no cyanosis or edema Neurological exam reveals alert, oriented, normal speech, no focal findings.  Pelvic: exam chaperoned by nurse;  Vulva: diffuse erythematous appearing vulva with no masses, tenderness or lesions; Vagina:  agglutinated and length is only 4 cm, no tumor seen or palpated, no discharge appreciated at all; Adnexa: surgically absent; Uterus and Cervix: surgically absent, vaginal cuff not visualized due to agglutination;  No masses.  Rectal: confirmatory.    Assessment:  Traci [name] is a 83 y.o. female diagnosed with pelvic recurrence of stage IB grade 1 endometroid endometrial adenocarcinoma s/p surgery and brachytherapy 2013. She had a right pelvic side wall mass invading bone/bladder and hydronephrosis, and got a ureteral stent and external pelvic radiation in 11/16.  On adjuvant Megace and has residual disease in pelvis, with partial response on PET/CT followed by stable CT scan. No evidence of disease on pelvic exam.  Probable Vulvitis most likely fungal in origin, asymptomatic   Metastatic lesions to the bone on X-geva- every 4 weeks, calcium and vit D managed by Dr. Rogue Bussing.   Left lower extremity swelling chronic/DVT- continue Eliquis. Has had issues with VTE and bleeding managed by Dr Rogue Bussing. IVC filter remains in place.    She has an atrophic right kidney and has right ureteral stent managed by Urology.  Dizziness and orthostatic, concern for dehydration.    Plan:   Problem List Items Addressed This Visit      Genitourinary   Endometrial cancer ([place]) - Primary   Relevant Medications   meclizine (ANTIVERT) 12.5 MG tablet      Continue Megace therapy per Dr Rogue Bussing.  Her recurrent grade I endometrial cancer is still persistent in the right pelvis and involves bone, but she has minimal symptoms and partial response to Megace with stable disease on follow up imaging.    Dizziness and orthostatic, concern for dehydration and poor oral intake. I provided orthostatic precautions. She may benefit from IV fluids but I will defer to Dr. Rogue Bussing.   She will RTC to see [place] in 3 months for pelvic exam and will follow up with Dr Rogue Bussing as scheduled.  Gillis Ends,  MD  CC: Dr. Ouida Sills  Dr. Rogue Bussing

## 2016-12-27 ENCOUNTER — Inpatient Hospital Stay (HOSPITAL_BASED_OUTPATIENT_CLINIC_OR_DEPARTMENT_OTHER): Payer: Medicare Other | Admitting: Obstetrics and Gynecology

## 2016-12-27 ENCOUNTER — Inpatient Hospital Stay: Payer: Medicare Other

## 2016-12-27 ENCOUNTER — Inpatient Hospital Stay (HOSPITAL_BASED_OUTPATIENT_CLINIC_OR_DEPARTMENT_OTHER): Payer: Medicare Other | Admitting: Internal Medicine

## 2016-12-27 VITALS — BP 139/68 | HR 60 | Temp 99.2°F | Resp 18 | Ht 60.0 in | Wt 116.3 lb

## 2016-12-27 DIAGNOSIS — C541 Malignant neoplasm of endometrium: Secondary | ICD-10-CM

## 2016-12-27 DIAGNOSIS — Z8673 Personal history of transient ischemic attack (TIA), and cerebral infarction without residual deficits: Secondary | ICD-10-CM

## 2016-12-27 DIAGNOSIS — M25551 Pain in right hip: Secondary | ICD-10-CM

## 2016-12-27 DIAGNOSIS — Z880 Allergy status to penicillin: Secondary | ICD-10-CM

## 2016-12-27 DIAGNOSIS — Z79818 Long term (current) use of other agents affecting estrogen receptors and estrogen levels: Secondary | ICD-10-CM

## 2016-12-27 DIAGNOSIS — Z90710 Acquired absence of both cervix and uterus: Secondary | ICD-10-CM

## 2016-12-27 DIAGNOSIS — Z90722 Acquired absence of ovaries, bilateral: Secondary | ICD-10-CM | POA: Diagnosis not present

## 2016-12-27 DIAGNOSIS — E2740 Unspecified adrenocortical insufficiency: Secondary | ICD-10-CM

## 2016-12-27 DIAGNOSIS — I82502 Chronic embolism and thrombosis of unspecified deep veins of left lower extremity: Secondary | ICD-10-CM | POA: Diagnosis not present

## 2016-12-27 DIAGNOSIS — Z9221 Personal history of antineoplastic chemotherapy: Secondary | ICD-10-CM | POA: Diagnosis not present

## 2016-12-27 DIAGNOSIS — C50812 Malignant neoplasm of overlapping sites of left female breast: Secondary | ICD-10-CM | POA: Diagnosis not present

## 2016-12-27 DIAGNOSIS — Z9012 Acquired absence of left breast and nipple: Secondary | ICD-10-CM | POA: Diagnosis not present

## 2016-12-27 DIAGNOSIS — Z86711 Personal history of pulmonary embolism: Secondary | ICD-10-CM

## 2016-12-27 DIAGNOSIS — Z86718 Personal history of other venous thrombosis and embolism: Secondary | ICD-10-CM

## 2016-12-27 DIAGNOSIS — C7951 Secondary malignant neoplasm of bone: Secondary | ICD-10-CM

## 2016-12-27 DIAGNOSIS — Z923 Personal history of irradiation: Secondary | ICD-10-CM | POA: Diagnosis not present

## 2016-12-27 DIAGNOSIS — K219 Gastro-esophageal reflux disease without esophagitis: Secondary | ICD-10-CM

## 2016-12-27 DIAGNOSIS — Z86010 Personal history of colonic polyps: Secondary | ICD-10-CM

## 2016-12-27 DIAGNOSIS — N189 Chronic kidney disease, unspecified: Secondary | ICD-10-CM

## 2016-12-27 DIAGNOSIS — Z170 Estrogen receptor positive status [ER+]: Secondary | ICD-10-CM

## 2016-12-27 DIAGNOSIS — Z7901 Long term (current) use of anticoagulants: Secondary | ICD-10-CM

## 2016-12-27 DIAGNOSIS — C7911 Secondary malignant neoplasm of bladder: Secondary | ICD-10-CM

## 2016-12-27 DIAGNOSIS — Z8541 Personal history of malignant neoplasm of cervix uteri: Secondary | ICD-10-CM | POA: Diagnosis not present

## 2016-12-27 DIAGNOSIS — Z79899 Other long term (current) drug therapy: Secondary | ICD-10-CM

## 2016-12-27 DIAGNOSIS — Z8619 Personal history of other infectious and parasitic diseases: Secondary | ICD-10-CM

## 2016-12-27 DIAGNOSIS — Z803 Family history of malignant neoplasm of breast: Secondary | ICD-10-CM

## 2016-12-27 DIAGNOSIS — R42 Dizziness and giddiness: Secondary | ICD-10-CM

## 2016-12-27 LAB — CBC WITH DIFFERENTIAL/PLATELET
BASOS ABS: 0 10*3/uL (ref 0–0.1)
Basophils Relative: 1 %
EOS ABS: 0 10*3/uL (ref 0–0.7)
Eosinophils Relative: 0 %
HCT: 42.4 % (ref 35.0–47.0)
HEMOGLOBIN: 14.3 g/dL (ref 12.0–16.0)
LYMPHS ABS: 0.6 10*3/uL — AB (ref 1.0–3.6)
Lymphocytes Relative: 8 %
MCH: 31.8 pg (ref 26.0–34.0)
MCHC: 33.7 g/dL (ref 32.0–36.0)
MCV: 94.2 fL (ref 80.0–100.0)
Monocytes Absolute: 0.5 10*3/uL (ref 0.2–0.9)
Monocytes Relative: 6 %
NEUTROS PCT: 85 %
Neutro Abs: 6.5 10*3/uL (ref 1.4–6.5)
Platelets: 214 10*3/uL (ref 150–440)
RBC: 4.5 MIL/uL (ref 3.80–5.20)
RDW: 14.2 % (ref 11.5–14.5)
WBC: 7.6 10*3/uL (ref 3.6–11.0)

## 2016-12-27 LAB — COMPREHENSIVE METABOLIC PANEL
ALT: 20 U/L (ref 14–54)
AST: 17 U/L (ref 15–41)
Albumin: 3.6 g/dL (ref 3.5–5.0)
Alkaline Phosphatase: 33 U/L — ABNORMAL LOW (ref 38–126)
Anion gap: 7 (ref 5–15)
BUN: 35 mg/dL — ABNORMAL HIGH (ref 6–20)
CHLORIDE: 103 mmol/L (ref 101–111)
CO2: 26 mmol/L (ref 22–32)
CREATININE: 1.03 mg/dL — AB (ref 0.44–1.00)
Calcium: 9.1 mg/dL (ref 8.9–10.3)
GFR, EST AFRICAN AMERICAN: 56 mL/min — AB (ref >60)
GFR, EST NON AFRICAN AMERICAN: 48 mL/min — AB (ref >60)
Glucose, Bld: 104 mg/dL — ABNORMAL HIGH (ref 65–99)
POTASSIUM: 4 mmol/L (ref 3.5–5.1)
Sodium: 136 mmol/L (ref 135–145)
TOTAL PROTEIN: 6.3 g/dL — AB (ref 6.5–8.1)
Total Bilirubin: 0.7 mg/dL (ref 0.3–1.2)

## 2016-12-27 MED ORDER — MIDODRINE HCL 2.5 MG PO TABS: 2.5000 mg | ORAL_TABLET | Freq: Three times a day (TID) | ORAL | 1 refills | Status: DC

## 2016-12-27 MED ORDER — DEXAMETHASONE 2 MG PO TABS: ORAL_TABLET | ORAL | 1 refills | Status: DC

## 2016-12-27 MED ORDER — DENOSUMAB 120 MG/1.7ML ~~LOC~~ SOLN
120.0000 mg | Freq: Once | SUBCUTANEOUS | Status: AC
  Administered 2016-12-27: 120 mg via SUBCUTANEOUS
  Filled 2016-12-27: qty 1.7

## 2016-12-27 NOTE — Progress Notes (Signed)
Patient reports dizziness Ortho stats were  Laying- 149/73 pulse 72 Sitting- 139/68 pulse 60 Standing- 123/75  Pulse 83

## 2016-12-27 NOTE — Assessment & Plan Note (Addendum)
#   Metastatic endometrial cancer to the bone- currently on Megace. Tolerating it well-question adrenal insufficiency [see discussion below]. July 2018- abdomen pelvis CT scan stable right iliac lesion.   #  No clinical progression noted. Continue Megace at this time. CT scan has been ordered today   # Pain- improved. on fentanyl patch to 75/ and percocet 10 every 8-12 hours. New scripts given.   #Dizzy spells chronic nausea-orthostatic vital signs positive.  Question insufficiency versus others [a.m. cortisol level 5]-question secondary to Megace.  Nausea improved.  Given orthostasis-recommend Midodrin 2.5 mg 3 times daily.  Prescription given.  Discussed regarding plan hypertension.  Discussed with Dr. Edger House recommendations.   # Left lower extremity swelling chronic/DVT- continue Eliquis.  # Metastatic lesions to the bone on X-geva- every 4 weeks. Tolerating well.  Calcium normal; no concerns for osteonecrosis of the jaw. Continue ca+ vit D.   # follow up in 4 weeks or sooner/CT scan prior; labs/ x-geva.  Also discussed with the husband to contact Dr. Trena Platt office for an appointment for a follow-up of ongoing dizzy spells.

## 2016-12-27 NOTE — Progress Notes (Signed)
[facility] OFFICE PROGRESS NOTE  Patient Care Team: Idelle Crouch, MD as PCP - General (Unknown Physician Specialty) [name], MD as Referring Physician (Surgery) [name], MD as Consulting Physician (Urology)  Cancer of left breast [facility])   Staging form: Breast, AJCC 7th Edition     Clinical: Stage IA (T1b, N0, M0) - Signed by Forest Gleason, MD on 10/20/2014    Oncology History   . Patient has a previous history of endometrium carcinoma of endometrium stage IB disease status post bilateral salpingo-oophorectomy and radiation therapy 4. Right hydronephrosis detected on MRI scan off lumbosacral spine. Cystoscopy with attempted stent placement revealed bladder tumor in September of 2016 biopsies positive for recurrent endometrial cancer  5. Patient had stent placement in the right kidney Started radiation therapy 6. Patient has finished radiation therapy with relief in the pain (November, 2016) 7. Started on Megace in December of 2016 8 deep vein thrombosis in the left lower extremity (February 08, 2014) Hematuria due to xeralto IVC filter placement on February 11, 2015 She is off xeralto 9.condition recently had a cystoscopy no evidence of bleeding was found so patient has been started on ELOQUIS (March 15, 2015)  10. Patient again had a significant bleeding with hematuria with eloquis and has been discontinued; Patient is on Plavix. ON  Megace. AUG 2017- PET IMPROVED; mediastinal uptake- monitor for now.   # August 25 2015- LLE DVT- start Eliquis [stop plavix]; PET  FEB 2017-LEFT BREAST CA [9'0 clock- overlapping site] Estrogen receptor positive. Progesterone receptor positive. HER-2 receptor; Her 2neg; 5 mm tumor clinically stage IB N0 M0 tumor;s/p Lumpectomy; NO RT  # PET NOV 27th- STABLE right iliac lesions [increasing pain]/ right paratrac- refer to RT  # NOV 7th 2018- ? Adrenal insuff- Start Dex '2mg'$ /day  # X-geva q 4  w  # MOLECULAR STUDIES: 08/23/15- MMR-STABLE.      Cancer of left breast ([place])   10/20/2014 Initial Diagnosis    Cancer of left breast       Endometrial cancer ([place])   INTERVAL HISTORY:  Traci [name] 83 y.o.  female pleasant patient above history of Metastatic endometrial cancer currently on Megace- Is here for follow-up/ Accompanied by her husband.   At the last visit patient was started on dexamethasone 2 mg once a day for possible adrenal insufficiency [given low fasting cortisol]; chronic nausea.   Patient symptoms of nausea improved.  However she denies any significant improvement the dizzy spells.  The dizzy spells continue to be ongoing basis.  Patient continues to have right hip pain; currently stable on fentanyl patch. She is also needing to take breakthrough pain medication up to 2 times a day.  No new shortness of breath or cough. No falls.   Patient was evaluated by gynecology oncology this morning.   REVIEW OF SYSTEMS:  A complete 10 point review of system is done which is negative except mentioned above/history of present illness.   PAST MEDICAL HISTORY :  Past Medical History:  Diagnosis Date  . Arthritis    Degenerative Disc Disease  . Bone cancer ([place])   . Breast cancer ([place]) 2016   LT LUMPECTOMY 03-2015  . Chronic kidney disease    HYDRONEPHROSIS  . Deep vein blood clot of left lower extremity ([place]) February 11, 2015   Left Leg  . Endometrial adenocarcinoma ([place]) 01/2012    stage Ib, grade 1, positive peritoneal cytology, no other risk factors or extra-uterine disease  .  Gastric ulcer   . GERD (gastroesophageal reflux disease)   . Hearing loss   . Hiccups   . History of brachytherapy   . History of colon polyps   . History of DVT (deep vein thrombosis)   . History of esophageal stricture   . History of shingles   . Inguinal lymphocyst   . Mild aphasia   . Pain    SEVERE CHRONIC RIGHT LEG,HIP,FOOT  . PE (pulmonary embolism)    H/O  . Stroke ([place])  04/06/2011   residual:  aphasia    PAST SURGICAL HISTORY :   Past Surgical History:  Procedure Laterality Date  . ABDOMINAL HYSTERECTOMY  December 2013   history of cervical cancer  . BACK SURGERY     fusion of L3&4  . BREAST BIOPSY Left 10/13/2014   POS  . BREAST LUMPECTOMY Left 03/2015  . CATARACT EXTRACTION W/ INTRAOCULAR LENS IMPLANT     right  . CYSTOSCOPY W/ RETROGRADES Right 10/28/2014   Procedure: CYSTOSCOPY WITH RETROGRADE PYELOGRAM ATTEMPT, UNABLE TO PASS ;  Surgeon: [name], MD;  Location: [facility];  Service: Urology;  Laterality: Right;  . CYSTOSCOPY W/ RETROGRADES Right 09/15/2015   Procedure: CYSTOSCOPY WITH RETROGRADE PYELOGRAM;  Surgeon: [name], MD;  Location: [facility];  Service: Urology;  Laterality: Right;  . CYSTOSCOPY W/ URETERAL STENT PLACEMENT Right 03/10/2015   Procedure: CYSTOSCOPY WITH STENT REPLACEMENT;  Surgeon: [name], MD;  Location: [facility];  Service: Urology;  Laterality: Right;  . CYSTOSCOPY W/ URETERAL STENT PLACEMENT Right 06/09/2015   Procedure: CYSTOSCOPY WITH STENT REPLACEMENT;  Surgeon: [name], MD;  Location: [facility];  Service: Urology;  Laterality: Right;  . CYSTOSCOPY W/ URETERAL STENT PLACEMENT Right 09/15/2015   Procedure: CYSTOSCOPY WITH STENT REPLACEMENT;  Surgeon: [name], MD;  Location: [facility];  Service: Urology;  Laterality: Right;  . CYSTOSCOPY WITH STENT PLACEMENT Right 10/28/2014   Procedure: BLADDER BIOPSY WITH FULGERATION ;  Surgeon: [name], MD;  Location: [facility];  Service: Urology;  Laterality: Right;  . ESOPHAGEAL DILATION    . EYE SURGERY Bilateral    Cataract Extraction  . PARTIAL MASTECTOMY WITH NEEDLE LOCALIZATION Left 03/30/2015   Procedure: PARTIAL MASTECTOMY WITH NEEDLE LOCALIZATION;  Surgeon: [name], MD;  Location: [facility];  Service: General;  Laterality: Left;  . PERIPHERAL VASCULAR CATHETERIZATION N/A 02/11/2015   Procedure: IVC Filter Insertion;   Surgeon: Algernon Huxley, MD;  Location: [place] [facility];  Service: Cardiovascular;  Laterality: N/A;  . PORTACATH PLACEMENT Right 11/26/2014   Procedure: INSERTION PORT-A-CATH;  Surgeon: [name], MD;  Location: [facility];  Service: General;  Laterality: Right;  . SENTINEL NODE BIOPSY Left 03/30/2015   Procedure: SENTINEL NODE BIOPSY;  Surgeon: [name], MD;  Location: [facility];  Service: General;  Laterality: Left;  . thyroid removed    . THYROIDECTOMY, PARTIAL    . TONSILLECTOMY    . TOTAL ABDOMINAL HYSTERECTOMY W/ BILATERAL SALPINGOOPHORECTOMY Bilateral    staging biopsies    FAMILY HISTORY :   Family History  Problem Relation Age of Onset  . Heart attack Mother   . Breast cancer Maternal Aunt        x 2    SOCIAL HISTORY:   Social History   Tobacco Use  . Smoking status: Never Smoker  . Smokeless tobacco: Never Used  Substance Use Topics  . Alcohol use: No    Alcohol/week: 0.0 oz  .  Drug use: No    ALLERGIES:  is allergic to penicillins; acetaminophen; hydrocodone-acetaminophen; novocain [procaine]; and sulfa antibiotics.  MEDICATIONS:  Current Outpatient Medications  Medication Sig Dispense Refill  . apixaban (ELIQUIS) 5 MG TABS tablet Take 1 tablet (5 mg total) by mouth 2 (two) times daily. 60 tablet 0  . dexamethasone (DECADRON) 2 MG tablet One pill once a day with food. 30 tablet 1  . fentaNYL (DURAGESIC - DOSED MCG/HR) 75 MCG/HR Place 1 patch (75 mcg total) every 3 (three) days onto the skin. 10 patch 0  . gabapentin (NEURONTIN) 300 MG capsule Take 1 capsule (300 mg total) by mouth 3 (three) times daily. 90 capsule 2  . lidocaine-prilocaine (EMLA) cream Apply 1 application topically as needed. 30 g 3  . meclizine (ANTIVERT) 12.5 MG tablet Take by mouth.    . megestrol (MEGACE) 40 MG tablet Take 1 tablet (40 mg total) 4 (four) times daily by mouth. 120 tablet 2  . midodrine (PROAMATINE) 2.5 MG tablet Take 1 tablet (2.5 mg total) by mouth 3  (three) times daily with meals. 90 tablet 1  . Multiple Vitamins-Minerals (CENTRUM SILVER) tablet Take 1 tablet by mouth daily.    Marland Kitchen oxyCODONE-acetaminophen (PERCOCET) 10-325 MG tablet Take 1 tablet 3 (three) times daily as needed by mouth for pain. 90 tablet 0   No current facility-administered medications for this visit.     PHYSICAL EXAMINATION: ECOG PERFORMANCE STATUS: 1 - Symptomatic but completely ambulatory  There were no vitals taken for this visit.  There were no vitals filed for this visit.  GENERAL: Mechele Claude Caucasian female patient walking herself Alert, no distress and comfortable. She Is accompanied by her husband.  EYES: no pallor or icterus OROPHARYNX: no thrush or ulceration; good dentition  NECK: supple, no masses felt LYMPH:  no palpable lymphadenopathy in the cervical, axillary or inguinal regions LUNGS: clear to auscultation and  No wheeze or crackles HEART/CVS: regular rate & rhythm and no murmurs; Left lower extremity swollen compared to the right.  ABDOMEN:abdomen soft, non-tender and normal bowel sounds Musculoskeletal:no cyanosis of digits and no clubbing  PSYCH: alert & oriented x 3 with fluent speech NEURO: no focal motor/sensory deficits SKIN:  no rashes or significant lesions  LABORATORY DATA:  I have reviewed the data as listed    Component Value Date/Time   NA 136 12/27/2016 1130   NA 139 11/04/2013 1257   K 4.0 12/27/2016 1130   K 4.3 03/05/2014 1156   CL 103 12/27/2016 1130   CL 106 11/04/2013 1257   CO2 26 12/27/2016 1130   CO2 26 11/04/2013 1257   GLUCOSE 104 (H) 12/27/2016 1130   GLUCOSE 96 11/04/2013 1257   BUN 35 (H) 12/27/2016 1130   BUN 13 11/04/2013 1257   CREATININE 1.03 (H) 12/27/2016 1130   CREATININE 0.90 11/04/2013 1257   CALCIUM 9.1 12/27/2016 1130   CALCIUM 8.5 11/04/2013 1257   PROT 6.3 (L) 12/27/2016 1130   PROT 6.8 11/04/2013 1257   ALBUMIN 3.6 12/27/2016 1130   ALBUMIN 3.5 11/04/2013 1257   AST 17  12/27/2016 1130   AST 25 11/04/2013 1257   ALT 20 12/27/2016 1130   ALT 20 11/04/2013 1257   ALKPHOS 33 (L) 12/27/2016 1130   ALKPHOS 64 11/04/2013 1257   BILITOT 0.7 12/27/2016 1130   BILITOT 0.5 11/04/2013 1257   GFRNONAA 48 (L) 12/27/2016 1130   GFRNONAA >60 11/04/2013 1257   GFRNONAA >60 03/29/2012 1650   GFRAA 56 (L) 12/27/2016 1130  GFRAA >60 11/04/2013 1257   GFRAA >60 03/29/2012 1650    No results found for: SPEP, UPEP  Lab Results  Component Value Date   WBC 7.6 12/27/2016   NEUTROABS 6.5 12/27/2016   HGB 14.3 12/27/2016   HCT 42.4 12/27/2016   MCV 94.2 12/27/2016   PLT 214 12/27/2016      Chemistry      Component Value Date/Time   NA 136 12/27/2016 1130   NA 139 11/04/2013 1257   K 4.0 12/27/2016 1130   K 4.3 03/05/2014 1156   CL 103 12/27/2016 1130   CL 106 11/04/2013 1257   CO2 26 12/27/2016 1130   CO2 26 11/04/2013 1257   BUN 35 (H) 12/27/2016 1130   BUN 13 11/04/2013 1257   CREATININE 1.03 (H) 12/27/2016 1130   CREATININE 0.90 11/04/2013 1257      Component Value Date/Time   CALCIUM 9.1 12/27/2016 1130   CALCIUM 8.5 11/04/2013 1257   ALKPHOS 33 (L) 12/27/2016 1130   ALKPHOS 64 11/04/2013 1257   AST 17 12/27/2016 1130   AST 25 11/04/2013 1257   ALT 20 12/27/2016 1130   ALT 20 11/04/2013 1257   BILITOT 0.7 12/27/2016 1130   BILITOT 0.5 11/04/2013 1257       RADIOGRAPHIC STUDIES: I have personally reviewed the radiological images as listed and agreed with the findings in the report. No results found.   ASSESSMENT & PLAN:  Endometrial cancer ([place]) # Metastatic endometrial cancer to the bone- currently on Megace. Tolerating it well-question adrenal insufficiency [see discussion below]. July 2018- abdomen pelvis CT scan stable right iliac lesion.   #  No clinical progression noted. Continue Megace at this time. CT scan has been ordered today   # Pain- improved. on fentanyl patch to 75/ and percocet 10 every 8-12 hours. New scripts given.    #Dizzy spells chronic nausea-orthostatic vital signs positive.  Question insufficiency versus others [a.m. cortisol level 5]-question secondary to Megace.  Nausea improved.  Given orthostasis-recommend Midodrin 2.5 mg 3 times daily.  Prescription given.  Discussed regarding plan hypertension.  Discussed with Dr. Edger House recommendations.   # Left lower extremity swelling chronic/DVT- continue Eliquis.  # Metastatic lesions to the bone on X-geva- every 4 weeks. Tolerating well.  Calcium normal; no concerns for osteonecrosis of the jaw. Continue ca+ vit D.   # follow up in 4 weeks or sooner/CT scan prior; labs/ x-geva.  Also discussed with the husband to contact Dr. Trena Platt office for an appointment for a follow-up of ongoing dizzy spells.   Orders Placed This Encounter  Procedures  . CT ABDOMEN PELVIS W CONTRAST    Standing Status:   Future    Standing Expiration Date:   12/27/2017    Order Specific Question:   If indicated for the ordered procedure, I authorize the administration of contrast media per Radiology protocol    Answer:   Yes    Order Specific Question:   Preferred imaging location?    Answer:   [facility]    Order Specific Question:   Radiology Contrast Protocol - do NOT remove file path    Answer:   [email or web address]    Order Specific Question:   Reason for Exam additional comments    Answer:   endometrial cancer      Cammie Sickle, MD 12/28/2016 3:45 AM

## 2017-01-18 ENCOUNTER — Ambulatory Visit: Payer: Medicare Other

## 2017-01-19 ENCOUNTER — Ambulatory Visit
Admission: RE | Admit: 2017-01-19 | Discharge: 2017-01-19 | Disposition: A | Discharge status: Home / Self Care | Payer: Medicare Other | Source: Ambulatory Visit | Attending: Internal Medicine | Admitting: Internal Medicine

## 2017-01-19 DIAGNOSIS — C541 Malignant neoplasm of endometrium: Secondary | ICD-10-CM | POA: Diagnosis not present

## 2017-01-19 DIAGNOSIS — N261 Atrophy of kidney (terminal): Secondary | ICD-10-CM | POA: Diagnosis not present

## 2017-01-19 DIAGNOSIS — M899 Disorder of bone, unspecified: Secondary | ICD-10-CM | POA: Insufficient documentation

## 2017-01-19 LAB — POCT I-STAT CREATININE: Creatinine, Ser: 1.2 mg/dL — ABNORMAL HIGH (ref 0.44–1.00)

## 2017-01-19 MED ORDER — IOPAMIDOL (ISOVUE-300) INJECTION 61%
80.0000 mL | Freq: Once | INTRAVENOUS | Status: AC | PRN
  Administered 2017-01-19: 100 mL via INTRAVENOUS

## 2017-01-25 ENCOUNTER — Other Ambulatory Visit: Payer: Self-pay

## 2017-01-25 ENCOUNTER — Inpatient Hospital Stay: Payer: Medicare Other

## 2017-01-25 ENCOUNTER — Inpatient Hospital Stay: Payer: Medicare Other | Attending: Internal Medicine | Admitting: Internal Medicine

## 2017-01-25 VITALS — BP 112/72 | HR 65 | Temp 97.8°F | Resp 18

## 2017-01-25 DIAGNOSIS — Z86718 Personal history of other venous thrombosis and embolism: Secondary | ICD-10-CM | POA: Insufficient documentation

## 2017-01-25 DIAGNOSIS — I951 Orthostatic hypotension: Secondary | ICD-10-CM | POA: Diagnosis not present

## 2017-01-25 DIAGNOSIS — Z90710 Acquired absence of both cervix and uterus: Secondary | ICD-10-CM | POA: Diagnosis not present

## 2017-01-25 DIAGNOSIS — Z79899 Other long term (current) drug therapy: Secondary | ICD-10-CM | POA: Diagnosis not present

## 2017-01-25 DIAGNOSIS — Z923 Personal history of irradiation: Secondary | ICD-10-CM | POA: Diagnosis not present

## 2017-01-25 DIAGNOSIS — M25551 Pain in right hip: Secondary | ICD-10-CM | POA: Diagnosis not present

## 2017-01-25 DIAGNOSIS — Z9221 Personal history of antineoplastic chemotherapy: Secondary | ICD-10-CM | POA: Insufficient documentation

## 2017-01-25 DIAGNOSIS — C7911 Secondary malignant neoplasm of bladder: Secondary | ICD-10-CM

## 2017-01-25 DIAGNOSIS — Z7901 Long term (current) use of anticoagulants: Secondary | ICD-10-CM | POA: Diagnosis not present

## 2017-01-25 DIAGNOSIS — Z86711 Personal history of pulmonary embolism: Secondary | ICD-10-CM | POA: Diagnosis not present

## 2017-01-25 DIAGNOSIS — Z9012 Acquired absence of left breast and nipple: Secondary | ICD-10-CM | POA: Diagnosis not present

## 2017-01-25 DIAGNOSIS — C50812 Malignant neoplasm of overlapping sites of left female breast: Secondary | ICD-10-CM | POA: Insufficient documentation

## 2017-01-25 DIAGNOSIS — C7951 Secondary malignant neoplasm of bone: Secondary | ICD-10-CM | POA: Insufficient documentation

## 2017-01-25 DIAGNOSIS — Z90722 Acquired absence of ovaries, bilateral: Secondary | ICD-10-CM | POA: Diagnosis not present

## 2017-01-25 DIAGNOSIS — C541 Malignant neoplasm of endometrium: Secondary | ICD-10-CM

## 2017-01-25 DIAGNOSIS — M1990 Unspecified osteoarthritis, unspecified site: Secondary | ICD-10-CM | POA: Diagnosis not present

## 2017-01-25 DIAGNOSIS — Z803 Family history of malignant neoplasm of breast: Secondary | ICD-10-CM | POA: Insufficient documentation

## 2017-01-25 DIAGNOSIS — Z86010 Personal history of colonic polyps: Secondary | ICD-10-CM | POA: Diagnosis not present

## 2017-01-25 DIAGNOSIS — E2740 Unspecified adrenocortical insufficiency: Secondary | ICD-10-CM | POA: Insufficient documentation

## 2017-01-25 DIAGNOSIS — K219 Gastro-esophageal reflux disease without esophagitis: Secondary | ICD-10-CM | POA: Diagnosis not present

## 2017-01-25 DIAGNOSIS — Z8541 Personal history of malignant neoplasm of cervix uteri: Secondary | ICD-10-CM | POA: Insufficient documentation

## 2017-01-25 DIAGNOSIS — Z170 Estrogen receptor positive status [ER+]: Secondary | ICD-10-CM | POA: Insufficient documentation

## 2017-01-25 DIAGNOSIS — G4700 Insomnia, unspecified: Secondary | ICD-10-CM | POA: Insufficient documentation

## 2017-01-25 DIAGNOSIS — Z8673 Personal history of transient ischemic attack (TIA), and cerebral infarction without residual deficits: Secondary | ICD-10-CM | POA: Insufficient documentation

## 2017-01-25 DIAGNOSIS — Z880 Allergy status to penicillin: Secondary | ICD-10-CM | POA: Diagnosis not present

## 2017-01-25 DIAGNOSIS — N189 Chronic kidney disease, unspecified: Secondary | ICD-10-CM

## 2017-01-25 DIAGNOSIS — M7989 Other specified soft tissue disorders: Secondary | ICD-10-CM

## 2017-01-25 DIAGNOSIS — M79662 Pain in left lower leg: Secondary | ICD-10-CM

## 2017-01-25 LAB — CBC WITH DIFFERENTIAL/PLATELET
BASOS PCT: 1 %
Basophils Absolute: 0 10*3/uL (ref 0–0.1)
EOS ABS: 0.1 10*3/uL (ref 0–0.7)
EOS PCT: 3 %
HCT: 38.3 % (ref 35.0–47.0)
Hemoglobin: 13.1 g/dL (ref 12.0–16.0)
LYMPHS ABS: 1 10*3/uL (ref 1.0–3.6)
Lymphocytes Relative: 22 %
MCH: 33.2 pg (ref 26.0–34.0)
MCHC: 34.2 g/dL (ref 32.0–36.0)
MCV: 97.1 fL (ref 80.0–100.0)
MONOS PCT: 9 %
Monocytes Absolute: 0.4 10*3/uL (ref 0.2–0.9)
Neutro Abs: 3.1 10*3/uL (ref 1.4–6.5)
Neutrophils Relative %: 65 %
PLATELETS: 262 10*3/uL (ref 150–440)
RBC: 3.95 MIL/uL (ref 3.80–5.20)
RDW: 15.8 % — ABNORMAL HIGH (ref 11.5–14.5)
WBC: 4.7 10*3/uL (ref 3.6–11.0)

## 2017-01-25 LAB — COMPREHENSIVE METABOLIC PANEL
ALK PHOS: 32 U/L — AB (ref 38–126)
ALT: 15 U/L (ref 14–54)
AST: 20 U/L (ref 15–41)
Albumin: 3.1 g/dL — ABNORMAL LOW (ref 3.5–5.0)
Anion gap: 7 (ref 5–15)
BILIRUBIN TOTAL: 0.7 mg/dL (ref 0.3–1.2)
BUN: 24 mg/dL — AB (ref 6–20)
CALCIUM: 8.4 mg/dL — AB (ref 8.9–10.3)
CHLORIDE: 110 mmol/L (ref 101–111)
CO2: 22 mmol/L (ref 22–32)
CREATININE: 1.02 mg/dL — AB (ref 0.44–1.00)
GFR, EST AFRICAN AMERICAN: 56 mL/min — AB (ref >60)
GFR, EST NON AFRICAN AMERICAN: 49 mL/min — AB (ref >60)
Glucose, Bld: 112 mg/dL — ABNORMAL HIGH (ref 65–99)
Potassium: 3.7 mmol/L (ref 3.5–5.1)
Sodium: 139 mmol/L (ref 135–145)
Total Protein: 5.9 g/dL — ABNORMAL LOW (ref 6.5–8.1)

## 2017-01-25 MED ORDER — OXYCODONE-ACETAMINOPHEN 10-325 MG PO TABS: 1.0000 | ORAL_TABLET | Freq: Three times a day (TID) | ORAL | 0 refills | Status: DC | PRN

## 2017-01-25 MED ORDER — SODIUM CHLORIDE 0.9% FLUSH
10.0000 mL | INTRAVENOUS | Status: AC | PRN
  Administered 2017-01-25: 10 mL
  Filled 2017-01-25: qty 10

## 2017-01-25 MED ORDER — FENTANYL 75 MCG/HR TD PT72: 75.0000 ug | MEDICATED_PATCH | TRANSDERMAL | 0 refills | Status: DC

## 2017-01-25 MED ORDER — HEPARIN SOD (PORK) LOCK FLUSH 100 UNIT/ML IV SOLN
500.0000 [IU] | INTRAVENOUS | Status: AC | PRN
  Administered 2017-01-25: 500 [IU]

## 2017-01-25 MED ORDER — APIXABAN 2.5 MG PO TABS: 2.5000 mg | ORAL_TABLET | Freq: Two times a day (BID) | ORAL | 4 refills | Status: DC

## 2017-01-25 MED ORDER — DENOSUMAB 120 MG/1.7ML ~~LOC~~ SOLN
120.0000 mg | Freq: Once | SUBCUTANEOUS | Status: AC
  Administered 2017-01-25: 120 mg via SUBCUTANEOUS
  Filled 2017-01-25: qty 1.7

## 2017-01-25 NOTE — Assessment & Plan Note (Addendum)
#   Metastatic endometrial cancer to the bone- currently on Megace. Tolerating it well-question adrenal insufficiency [see discussion below]. DEC 2018- abdomen pelvis CT scan stable right iliac lesion.   #  No clinical progression noted. Continue Megace at this time. CT scan-STABLE.    # Pain- improved. on fentanyl patch to 75/ and percocet 10 every 8-12 hours. New scripts given.   # Dizzy spells/orthostatic hypotension- improved; on midrodrine. Falls sec to valium; still high risk for falls [see discussion below].  Discontinue/stop dexamethasone.  # Left lower extremity swelling chronic/DVT- continue Eliquis; but decrease the dose given falls-2.5 mg twice daily.  # Metastatic lesions to the bone on X-geva- every 4 weeks. Tolerating well.  Calcium normal; no concerns for osteonecrosis of the jaw. Continue ca+ vit D.   # follow up in 4 weeks/labs-X-geva.

## 2017-01-25 NOTE — Progress Notes (Signed)
[facility] OFFICE PROGRESS NOTE  Patient Care Team: Idelle Crouch, MD as PCP - General (Unknown Physician Specialty) [name], MD as Referring Physician (Surgery) [name], MD as Consulting Physician (Urology)  Cancer of left breast [facility])   Staging form: Breast, AJCC 7th Edition     Clinical: Stage IA (T1b, N0, M0) - Signed by Forest Gleason, MD on 10/20/2014    Oncology History   . Patient has a previous history of endometrium carcinoma of endometrium stage IB disease status post bilateral salpingo-oophorectomy and radiation therapy 4. Right hydronephrosis detected on MRI scan off lumbosacral spine. Cystoscopy with attempted stent placement revealed bladder tumor in September of 2016 biopsies positive for recurrent endometrial cancer  5. Patient had stent placement in the right kidney Started radiation therapy 6. Patient has finished radiation therapy with relief in the pain (November, 2016) 7. Started on Megace in December of 2016 8 deep vein thrombosis in the left lower extremity (February 08, 2014) Hematuria due to xeralto IVC filter placement on February 11, 2015 She is off xeralto 9.condition recently had a cystoscopy no evidence of bleeding was found so patient has been started on ELOQUIS (March 15, 2015)  10. Patient again had a significant bleeding with hematuria with eloquis and has been discontinued; Patient is on Plavix. ON  Megace. AUG 2017- PET IMPROVED; mediastinal uptake- monitor for now.   # August 25 2015- LLE DVT- start Eliquis [stop plavix]; PET  FEB 2017-LEFT BREAST CA [9'0 clock- overlapping site] Estrogen receptor positive. Progesterone receptor positive. HER-2 receptor; Her 2neg; 5 mm tumor clinically stage IB N0 M0 tumor;s/p Lumpectomy; NO RT  # PET NOV 27th- STABLE right iliac lesions [increasing pain]/ right paratrac- refer to RT  # NOV 7th 2018- ? Adrenal insuff- Start Dex '2mg'$ /day  # X-geva q 4  w  # MOLECULAR STUDIES: 08/23/15- MMR-STABLE.      Cancer of left breast ([place])   10/20/2014 Initial Diagnosis    Cancer of left breast       Endometrial cancer ([place])   INTERVAL HISTORY:  Traci [name] 83 y.o.  female pleasant patient above history of Metastatic endometrial cancer currently on Megace- Is here for follow-up/ Accompanied by her husband.   Patient started on Midodrine approximately a month ago.  Her symptoms of dizziness have improved.   However, because of difficulty sleeping patient was started on Valium by PCP.  As per the husband patient had falls because of altered mental status which he attributes to Valium.  Since stopping Valium symptoms have improved.  Patient continues to have right hip pain; currently stable on fentanyl patch. She is also needing to take breakthrough pain medication up to 2 times a day.  No new shortness of breath or cough.   REVIEW OF SYSTEMS:  A complete 10 point review of system is done which is negative except mentioned above/history of present illness.   PAST MEDICAL HISTORY :  Past Medical History:  Diagnosis Date  . Arthritis    Degenerative Disc Disease  . Bone cancer ([place])   . Breast cancer ([place]) 2016   LT LUMPECTOMY 03-2015  . Chronic kidney disease    HYDRONEPHROSIS  . Deep vein blood clot of left lower extremity ([place]) February 11, 2015   Left Leg  . Endometrial adenocarcinoma ([place]) 01/2012    stage Ib, grade 1, positive peritoneal cytology, no other risk factors or extra-uterine disease  . Gastric ulcer   . GERD (  gastroesophageal reflux disease)   . Hearing loss   . Hiccups   . History of brachytherapy   . History of colon polyps   . History of DVT (deep vein thrombosis)   . History of esophageal stricture   . History of shingles   . Inguinal lymphocyst   . Mild aphasia   . Pain    SEVERE CHRONIC RIGHT LEG,HIP,FOOT  . PE (pulmonary embolism)    H/O  . Stroke ([place]) 04/06/2011   residual:  aphasia    PAST  SURGICAL HISTORY :   Past Surgical History:  Procedure Laterality Date  . ABDOMINAL HYSTERECTOMY  December 2013   history of cervical cancer  . BACK SURGERY     fusion of L3&4  . BREAST BIOPSY Left 10/13/2014   POS  . BREAST LUMPECTOMY Left 03/2015  . CATARACT EXTRACTION W/ INTRAOCULAR LENS IMPLANT     right  . CYSTOSCOPY W/ RETROGRADES Right 10/28/2014   Procedure: CYSTOSCOPY WITH RETROGRADE PYELOGRAM ATTEMPT, UNABLE TO PASS ;  Surgeon: [name], MD;  Location: [facility];  Service: Urology;  Laterality: Right;  . CYSTOSCOPY W/ RETROGRADES Right 09/15/2015   Procedure: CYSTOSCOPY WITH RETROGRADE PYELOGRAM;  Surgeon: [name], MD;  Location: [facility];  Service: Urology;  Laterality: Right;  . CYSTOSCOPY W/ URETERAL STENT PLACEMENT Right 03/10/2015   Procedure: CYSTOSCOPY WITH STENT REPLACEMENT;  Surgeon: [name], MD;  Location: [facility];  Service: Urology;  Laterality: Right;  . CYSTOSCOPY W/ URETERAL STENT PLACEMENT Right 06/09/2015   Procedure: CYSTOSCOPY WITH STENT REPLACEMENT;  Surgeon: [name], MD;  Location: [facility];  Service: Urology;  Laterality: Right;  . CYSTOSCOPY W/ URETERAL STENT PLACEMENT Right 09/15/2015   Procedure: CYSTOSCOPY WITH STENT REPLACEMENT;  Surgeon: [name], MD;  Location: [facility];  Service: Urology;  Laterality: Right;  . CYSTOSCOPY WITH STENT PLACEMENT Right 10/28/2014   Procedure: BLADDER BIOPSY WITH FULGERATION ;  Surgeon: [name], MD;  Location: [facility];  Service: Urology;  Laterality: Right;  . ESOPHAGEAL DILATION    . EYE SURGERY Bilateral    Cataract Extraction  . PARTIAL MASTECTOMY WITH NEEDLE LOCALIZATION Left 03/30/2015   Procedure: PARTIAL MASTECTOMY WITH NEEDLE LOCALIZATION;  Surgeon: [name], MD;  Location: [facility];  Service: General;  Laterality: Left;  . PERIPHERAL VASCULAR CATHETERIZATION N/A 02/11/2015   Procedure: IVC Filter Insertion;  Surgeon: Algernon Huxley, MD;  Location: [facility];  Service: Cardiovascular;  Laterality: N/A;  . PORTACATH PLACEMENT Right 11/26/2014   Procedure: INSERTION PORT-A-CATH;  Surgeon: [name], MD;  Location: [facility];  Service: General;  Laterality: Right;  . SENTINEL NODE BIOPSY Left 03/30/2015   Procedure: SENTINEL NODE BIOPSY;  Surgeon: [name], MD;  Location: [facility];  Service: General;  Laterality: Left;  . thyroid removed    . THYROIDECTOMY, PARTIAL    . TONSILLECTOMY    . TOTAL ABDOMINAL HYSTERECTOMY W/ BILATERAL SALPINGOOPHORECTOMY Bilateral    staging biopsies    FAMILY HISTORY :   Family History  Problem Relation Age of Onset  . Heart attack Mother   . Breast cancer Maternal Aunt        x 2    SOCIAL HISTORY:   Social History   Tobacco Use  . Smoking status: Never Smoker  . Smokeless tobacco: Never Used  Substance Use Topics  . Alcohol use: No    Alcohol/week: 0.0 oz  . Drug use: No  ALLERGIES:  is allergic to penicillins; acetaminophen; hydrocodone-acetaminophen; novocain [procaine]; sulfa antibiotics; and valium [diazepam].  MEDICATIONS:  Current Outpatient Medications  Medication Sig Dispense Refill  . apixaban (ELIQUIS) 2.5 MG TABS tablet Take 1 tablet (2.5 mg total) by mouth 2 (two) times daily. 60 tablet 4  . dexamethasone (DECADRON) 2 MG tablet One pill once a day with food. 30 tablet 1  . fentaNYL (DURAGESIC - DOSED MCG/HR) 75 MCG/HR Place 1 patch (75 mcg total) onto the skin every 3 (three) days. 10 patch 0  . gabapentin (NEURONTIN) 300 MG capsule Take 1 capsule (300 mg total) by mouth 3 (three) times daily. 90 capsule 2  . lidocaine-prilocaine (EMLA) cream Apply 1 application topically as needed. 30 g 3  . megestrol (MEGACE) 40 MG tablet Take 1 tablet (40 mg total) 4 (four) times daily by mouth. (Patient taking differently: Take 40 mg by mouth 2 (two) times daily. ) 120 tablet 2  . midodrine (PROAMATINE) 2.5 MG tablet Take 1 tablet (2.5 mg total) by mouth 3 (three)  times daily with meals. 90 tablet 1  . Multiple Vitamins-Minerals (CENTRUM SILVER) tablet Take 1 tablet by mouth daily.    Marland Kitchen oxyCODONE-acetaminophen (PERCOCET) 10-325 MG tablet Take 1 tablet by mouth 3 (three) times daily as needed for pain. 90 tablet 0   No current facility-administered medications for this visit.     PHYSICAL EXAMINATION: ECOG PERFORMANCE STATUS: 1 - Symptomatic but completely ambulatory  BP 112/72   Pulse 65   Temp 97.8 F (36.6 C) (Tympanic)   Resp 18   There were no vitals filed for this visit.  GENERAL: Traci [name] Caucasian female patient walking herself Alert, no distress and comfortable. She Is accompanied by her husband.  EYES: no pallor or icterus OROPHARYNX: no thrush or ulceration; good dentition  NECK: supple, no masses felt LYMPH:  no palpable lymphadenopathy in the cervical, axillary or inguinal regions LUNGS: clear to auscultation and  No wheeze or crackles HEART/CVS: regular rate & rhythm and no murmurs; Left lower extremity swollen compared to the right.  ABDOMEN:abdomen soft, non-tender and normal bowel sounds Musculoskeletal:no cyanosis of digits and no clubbing  PSYCH: alert & oriented x 3 with fluent speech NEURO: no focal motor/sensory deficits SKIN:  no rashes or significant lesions  LABORATORY DATA:  I have reviewed the data as listed    Component Value Date/Time   NA 139 01/25/2017 1030   NA 139 11/04/2013 1257   K 3.7 01/25/2017 1030   K 4.3 03/05/2014 1156   CL 110 01/25/2017 1030   CL 106 11/04/2013 1257   CO2 22 01/25/2017 1030   CO2 26 11/04/2013 1257   GLUCOSE 112 (H) 01/25/2017 1030   GLUCOSE 96 11/04/2013 1257   BUN 24 (H) 01/25/2017 1030   BUN 13 11/04/2013 1257   CREATININE 1.02 (H) 01/25/2017 1030   CREATININE 0.90 11/04/2013 1257   CALCIUM 8.4 (L) 01/25/2017 1030   CALCIUM 8.5 11/04/2013 1257   PROT 5.9 (L) 01/25/2017 1030   PROT 6.8 11/04/2013 1257   ALBUMIN 3.1 (L) 01/25/2017 1030   ALBUMIN 3.5  11/04/2013 1257   AST 20 01/25/2017 1030   AST 25 11/04/2013 1257   ALT 15 01/25/2017 1030   ALT 20 11/04/2013 1257   ALKPHOS 32 (L) 01/25/2017 1030   ALKPHOS 64 11/04/2013 1257   BILITOT 0.7 01/25/2017 1030   BILITOT 0.5 11/04/2013 1257   GFRNONAA 49 (L) 01/25/2017 1030   GFRNONAA >60 11/04/2013 1257   GFRNONAA >  60 03/29/2012 1650   GFRAA 56 (L) 01/25/2017 1030   GFRAA >60 11/04/2013 1257   GFRAA >60 03/29/2012 1650    No results found for: SPEP, UPEP  Lab Results  Component Value Date   WBC 4.7 01/25/2017   NEUTROABS 3.1 01/25/2017   HGB 13.1 01/25/2017   HCT 38.3 01/25/2017   MCV 97.1 01/25/2017   PLT 262 01/25/2017      Chemistry      Component Value Date/Time   NA 139 01/25/2017 1030   NA 139 11/04/2013 1257   K 3.7 01/25/2017 1030   K 4.3 03/05/2014 1156   CL 110 01/25/2017 1030   CL 106 11/04/2013 1257   CO2 22 01/25/2017 1030   CO2 26 11/04/2013 1257   BUN 24 (H) 01/25/2017 1030   BUN 13 11/04/2013 1257   CREATININE 1.02 (H) 01/25/2017 1030   CREATININE 0.90 11/04/2013 1257      Component Value Date/Time   CALCIUM 8.4 (L) 01/25/2017 1030   CALCIUM 8.5 11/04/2013 1257   ALKPHOS 32 (L) 01/25/2017 1030   ALKPHOS 64 11/04/2013 1257   AST 20 01/25/2017 1030   AST 25 11/04/2013 1257   ALT 15 01/25/2017 1030   ALT 20 11/04/2013 1257   BILITOT 0.7 01/25/2017 1030   BILITOT 0.5 11/04/2013 1257       RADIOGRAPHIC STUDIES: I have personally reviewed the radiological images as listed and agreed with the findings in the report. No results found.   ASSESSMENT & PLAN:  Endometrial cancer ([place]) # Metastatic endometrial cancer to the bone- currently on Megace. Tolerating it well-question adrenal insufficiency [see discussion below]. DEC 2018- abdomen pelvis CT scan stable right iliac lesion.   #  No clinical progression noted. Continue Megace at this time. CT scan-STABLE.    # Pain- improved. on fentanyl patch to 75/ and percocet 10 every 8-12 hours. New  scripts given.   # Dizzy spells/orthostatic hypotension- improved; on midrodrine. Falls sec to valium; still high risk for falls [see discussion below].  Discontinue/stop dexamethasone.  # Left lower extremity swelling chronic/DVT- continue Eliquis; but decrease the dose given falls-2.5 mg twice daily.  # Metastatic lesions to the bone on X-geva- every 4 weeks. Tolerating well.  Calcium normal; no concerns for osteonecrosis of the jaw. Continue ca+ vit D.   # follow up in 4 weeks/labs-X-geva.   Orders Placed This Encounter  Procedures  . CBC with Differential    Standing Status:   Future    Standing Expiration Date:   01/25/2018  . Comprehensive metabolic panel    Standing Status:   Future    Standing Expiration Date:   01/25/2018      Cammie Sickle, MD 01/25/2017 6:30 PM

## 2017-02-06 ENCOUNTER — Other Ambulatory Visit: Payer: Self-pay | Admitting: Internal Medicine

## 2017-02-06 DIAGNOSIS — M7989 Other specified soft tissue disorders: Secondary | ICD-10-CM

## 2017-02-06 DIAGNOSIS — C541 Malignant neoplasm of endometrium: Secondary | ICD-10-CM

## 2017-02-06 DIAGNOSIS — M79662 Pain in left lower leg: Secondary | ICD-10-CM

## 2017-02-22 ENCOUNTER — Inpatient Hospital Stay: Payer: Medicare Other

## 2017-02-22 ENCOUNTER — Inpatient Hospital Stay: Payer: Medicare Other | Attending: Internal Medicine

## 2017-02-22 ENCOUNTER — Encounter: Payer: Self-pay | Admitting: Internal Medicine

## 2017-02-22 ENCOUNTER — Inpatient Hospital Stay (HOSPITAL_BASED_OUTPATIENT_CLINIC_OR_DEPARTMENT_OTHER): Payer: Medicare Other | Admitting: Internal Medicine

## 2017-02-22 VITALS — BP 126/84 | HR 64 | Temp 97.6°F | Resp 18

## 2017-02-22 DIAGNOSIS — C541 Malignant neoplasm of endometrium: Secondary | ICD-10-CM

## 2017-02-22 DIAGNOSIS — I825Z2 Chronic embolism and thrombosis of unspecified deep veins of left distal lower extremity: Secondary | ICD-10-CM

## 2017-02-22 DIAGNOSIS — C7951 Secondary malignant neoplasm of bone: Secondary | ICD-10-CM | POA: Diagnosis not present

## 2017-02-22 DIAGNOSIS — Z7901 Long term (current) use of anticoagulants: Secondary | ICD-10-CM | POA: Insufficient documentation

## 2017-02-22 LAB — COMPREHENSIVE METABOLIC PANEL
ALBUMIN: 3.6 g/dL (ref 3.5–5.0)
ALK PHOS: 34 U/L — AB (ref 38–126)
ALT: 15 U/L (ref 14–54)
AST: 19 U/L (ref 15–41)
Anion gap: 5 (ref 5–15)
BUN: 30 mg/dL — AB (ref 6–20)
CO2: 27 mmol/L (ref 22–32)
CREATININE: 1.1 mg/dL — AB (ref 0.44–1.00)
Calcium: 9 mg/dL (ref 8.9–10.3)
Chloride: 108 mmol/L (ref 101–111)
GFR calc Af Amer: 52 mL/min — ABNORMAL LOW (ref >60)
GFR calc non Af Amer: 44 mL/min — ABNORMAL LOW (ref >60)
GLUCOSE: 97 mg/dL (ref 65–99)
Potassium: 3.8 mmol/L (ref 3.5–5.1)
SODIUM: 140 mmol/L (ref 135–145)
Total Bilirubin: 0.7 mg/dL (ref 0.3–1.2)
Total Protein: 6.6 g/dL (ref 6.5–8.1)

## 2017-02-22 LAB — CBC WITH DIFFERENTIAL/PLATELET
Basophils Absolute: 0.1 10*3/uL (ref 0–0.1)
Basophils Relative: 1 %
Eosinophils Absolute: 0.2 10*3/uL (ref 0–0.7)
Eosinophils Relative: 5 %
HCT: 40.7 % (ref 35.0–47.0)
Hemoglobin: 13.9 g/dL (ref 12.0–16.0)
Lymphocytes Relative: 25 %
Lymphs Abs: 1.1 10*3/uL (ref 1.0–3.6)
MCH: 32.7 pg (ref 26.0–34.0)
MCHC: 34.1 g/dL (ref 32.0–36.0)
MCV: 96.1 fL (ref 80.0–100.0)
Monocytes Absolute: 0.5 10*3/uL (ref 0.2–0.9)
Monocytes Relative: 11 %
Neutro Abs: 2.4 10*3/uL (ref 1.4–6.5)
Neutrophils Relative %: 58 %
Platelets: 224 10*3/uL (ref 150–440)
RBC: 4.23 MIL/uL (ref 3.80–5.20)
RDW: 14.6 % — ABNORMAL HIGH (ref 11.5–14.5)
WBC: 4.2 10*3/uL (ref 3.6–11.0)

## 2017-02-22 MED ORDER — DENOSUMAB 120 MG/1.7ML ~~LOC~~ SOLN
120.0000 mg | Freq: Once | SUBCUTANEOUS | Status: AC
  Administered 2017-02-22: 120 mg via SUBCUTANEOUS
  Filled 2017-02-22: qty 1.7

## 2017-02-22 NOTE — Assessment & Plan Note (Addendum)
#   Metastatic endometrial cancer to the bone- currently on Megace. Tolerating it well-question adrenal insufficiency [see discussion below]. DEC 2018- abdomen pelvis CT scan stable right iliac lesion.   #  No clinical progression noted. Continue Megace at this time.  December 2018 CT scan-STABLE.   #  Pain- improved. on fentanyl patch to 75/ and percocet 10 every 8-12 hours.  # Dizzy spells/orthostatic hypotension- improved; on midrodrine.improved.  # Left lower extremity swelling chronic/DVT- continue Eliquis;but at-2.5 mg twice daily.  # Metastatic lesions to the bone on X-geva- every 4 weeks. Tolerating well.  Calcium normal; no concerns for osteonecrosis of the jaw. Continue ca+ vit D.  However given the absence of significant bony metastatic disease/stable disease recommend switching Xgeva to every 8 weeks.    # follow up in 8  weeks/labs-X-geva.

## 2017-02-22 NOTE — Progress Notes (Signed)
Traci [name] OFFICE PROGRESS NOTE  Patient Care Team: Idelle Crouch, MD as PCP - General (Unknown Physician Specialty) [name], MD as Referring Physician (Surgery) [name], MD as Consulting Physician (Urology)  Cancer of left breast [facility])   Staging form: Breast, AJCC 7th Edition     Clinical: Stage IA (T1b, N0, M0) - Signed by Forest Gleason, MD on 10/20/2014    Oncology History   . Patient has a previous history of endometrium carcinoma of endometrium stage IB disease status post bilateral salpingo-oophorectomy and radiation therapy 4. Right hydronephrosis detected on MRI scan off lumbosacral spine. Cystoscopy with attempted stent placement revealed bladder tumor in September of 2016 biopsies positive for recurrent endometrial cancer  5. Patient had stent placement in the right kidney Started radiation therapy 6. Patient has finished radiation therapy with relief in the pain (November, 2016) 7. Started on Megace in December of 2016 8 deep vein thrombosis in the left lower extremity (February 08, 2014) Hematuria due to xeralto IVC filter placement on February 11, 2015 She is off xeralto 9.condition recently had a cystoscopy no evidence of bleeding was found so patient has been started on ELOQUIS (March 15, 2015)  10. Patient again had a significant bleeding with hematuria with eloquis and has been discontinued; Patient is on Plavix. ON  Megace. AUG 2017- PET IMPROVED; mediastinal uptake- monitor for now.   FEB 2017-LEFT BREAST CA [9'0 clock- overlapping site] Estrogen receptor positive. Progesterone receptor positive. HER-2 receptor; Her 2neg; 5 mm tumor clinically stage IB N0 M0 tumor;s/p Lumpectomy; NO RT  # PET NOV 27th- STABLE right iliac lesions [increasing pain]/ right paratrac- refer to RT  # NOV 7th 2018- ? Adrenal insuff; STARTED MIDODRINE  # X-geva q 4 w; Jan 2019- every 8 w  # # August 25 2015- LLE DVT- start  Eliquis [stop plavix]; Jan 2019- Eliquis 2.5 mg BID  # MOLECULAR STUDIES: 08/23/15- MMR-STABLE.      Cancer of left breast ([place])   10/20/2014 Initial Diagnosis    Cancer of left breast       Endometrial cancer ([place])   INTERVAL HISTORY:  Traci [name] 84 y.o.  female pleasant patient above history of Metastatic endometrial cancer currently on Megace- Is here for follow-up/ Accompanied by her husband.   Dizziness improved since starting the midodrine. Patient continues to have right hip pain; currently stable on fentanyl patch. She is also needing to take breakthrough pain medication up to 2 times a day.  She continues to be at risk for falls.  However no recent falls.  No new shortness of breath or cough.   REVIEW OF SYSTEMS:  A complete 10 point review of system is done which is negative except mentioned above/history of present illness.   PAST MEDICAL HISTORY :  Past Medical History:  Diagnosis Date  . Arthritis    Degenerative Disc Disease  . Bone cancer ([place])   . Breast cancer ([place]) 2016   LT LUMPECTOMY 03-2015  . Chronic kidney disease    HYDRONEPHROSIS  . Deep vein blood clot of left lower extremity ([place]) February 11, 2015   Left Leg  . Endometrial adenocarcinoma ([place]) 01/2012    stage Ib, grade 1, positive peritoneal cytology, no other risk factors or extra-uterine disease  . Gastric ulcer   . GERD (gastroesophageal reflux disease)   . Hearing loss   . Hiccups   . History of brachytherapy   . History of colon polyps   .  History of DVT (deep vein thrombosis)   . History of esophageal stricture   . History of shingles   . Inguinal lymphocyst   . Mild aphasia   . Pain    SEVERE CHRONIC RIGHT LEG,HIP,FOOT  . PE (pulmonary embolism)    H/O  . Stroke ([place]) 04/06/2011   residual:  aphasia    PAST SURGICAL HISTORY :   Past Surgical History:  Procedure Laterality Date  . ABDOMINAL HYSTERECTOMY  December 2013   history of cervical cancer  . BACK SURGERY     fusion  of L3&4  . BREAST BIOPSY Left 10/13/2014   POS  . BREAST LUMPECTOMY Left 03/2015  . CATARACT EXTRACTION W/ INTRAOCULAR LENS IMPLANT     right  . CYSTOSCOPY W/ RETROGRADES Right 10/28/2014   Procedure: CYSTOSCOPY WITH RETROGRADE PYELOGRAM ATTEMPT, UNABLE TO PASS ;  Surgeon: [name], MD;  Location: [facility];  Service: Urology;  Laterality: Right;  . CYSTOSCOPY W/ RETROGRADES Right 09/15/2015   Procedure: CYSTOSCOPY WITH RETROGRADE PYELOGRAM;  Surgeon: [name], MD;  Location: [facility];  Service: Urology;  Laterality: Right;  . CYSTOSCOPY W/ URETERAL STENT PLACEMENT Right 03/10/2015   Procedure: CYSTOSCOPY WITH STENT REPLACEMENT;  Surgeon: [name], MD;  Location: [facility];  Service: Urology;  Laterality: Right;  . CYSTOSCOPY W/ URETERAL STENT PLACEMENT Right 06/09/2015   Procedure: CYSTOSCOPY WITH STENT REPLACEMENT;  Surgeon: [name], MD;  Location: [facility];  Service: Urology;  Laterality: Right;  . CYSTOSCOPY W/ URETERAL STENT PLACEMENT Right 09/15/2015   Procedure: CYSTOSCOPY WITH STENT REPLACEMENT;  Surgeon: [name], MD;  Location: [facility];  Service: Urology;  Laterality: Right;  . CYSTOSCOPY WITH STENT PLACEMENT Right 10/28/2014   Procedure: BLADDER BIOPSY WITH FULGERATION ;  Surgeon: [name], MD;  Location: [facility];  Service: Urology;  Laterality: Right;  . ESOPHAGEAL DILATION    . EYE SURGERY Bilateral    Cataract Extraction  . PARTIAL MASTECTOMY WITH NEEDLE LOCALIZATION Left 03/30/2015   Procedure: PARTIAL MASTECTOMY WITH NEEDLE LOCALIZATION;  Surgeon: [name], MD;  Location: [facility];  Service: General;  Laterality: Left;  . PERIPHERAL VASCULAR CATHETERIZATION N/A 02/11/2015   Procedure: IVC Filter Insertion;  Surgeon: Algernon Huxley, MD;  Location: [facility];  Service: Cardiovascular;  Laterality: N/A;  . PORTACATH PLACEMENT Right 11/26/2014   Procedure: INSERTION PORT-A-CATH;  Surgeon: [name], MD;   Location: [facility];  Service: General;  Laterality: Right;  . SENTINEL NODE BIOPSY Left 03/30/2015   Procedure: SENTINEL NODE BIOPSY;  Surgeon: [name], MD;  Location: [facility];  Service: General;  Laterality: Left;  . thyroid removed    . THYROIDECTOMY, PARTIAL    . TONSILLECTOMY    . TOTAL ABDOMINAL HYSTERECTOMY W/ BILATERAL SALPINGOOPHORECTOMY Bilateral    staging biopsies    FAMILY HISTORY :   Family History  Problem Relation Age of Onset  . Heart attack Mother   . Breast cancer Maternal Aunt        x 2    SOCIAL HISTORY:   Social History   Tobacco Use  . Smoking status: Never Smoker  . Smokeless tobacco: Never Used  Substance Use Topics  . Alcohol use: No    Alcohol/week: 0.0 oz  . Drug use: No    ALLERGIES:  is allergic to penicillins; acetaminophen; hydrocodone-acetaminophen; novocain [procaine]; sulfa antibiotics; and valium [diazepam].  MEDICATIONS:  Current Outpatient Medications  Medication Sig Dispense Refill  .  apixaban (ELIQUIS) 2.5 MG TABS tablet Take 1 tablet (2.5 mg total) by mouth 2 (two) times daily. 60 tablet 4  . dexamethasone (DECADRON) 2 MG tablet One pill once a day with food. 30 tablet 1  . fentaNYL (DURAGESIC - DOSED MCG/HR) 75 MCG/HR Place 1 patch (75 mcg total) onto the skin every 3 (three) days. 10 patch 0  . gabapentin (NEURONTIN) 300 MG capsule Take 1 capsule (300 mg total) by mouth 3 (three) times daily. 90 capsule 2  . lidocaine-prilocaine (EMLA) cream Apply 1 application topically as needed. 30 g 3  . megestrol (MEGACE) 40 MG tablet Take 1 tablet (40 mg total) 4 (four) times daily by mouth. (Patient taking differently: Take 40 mg by mouth 2 (two) times daily. ) 120 tablet 2  . midodrine (PROAMATINE) 2.5 MG tablet TAKE 1 TABLET BY MOUTH THREE TIMES DAILY WITH MEALS 90 tablet 3  . Multiple Vitamins-Minerals (CENTRUM SILVER) tablet Take 1 tablet by mouth daily.    Marland Kitchen oxyCODONE-acetaminophen (PERCOCET) 10-325 MG tablet Take 1 tablet  by mouth 3 (three) times daily as needed for pain. 90 tablet 0   No current facility-administered medications for this visit.     PHYSICAL EXAMINATION: ECOG PERFORMANCE STATUS: 1 - Symptomatic but completely ambulatory  BP 126/84   Pulse 64   Temp 97.6 F (36.4 C) (Tympanic)   Resp 18   There were no vitals filed for this visit.  GENERAL: Traci [name] Caucasian female patient walking herself Alert, no distress and comfortable. She Is accompanied by her husband.  EYES: no pallor or icterus OROPHARYNX: no thrush or ulceration; good dentition  NECK: supple, no masses felt LYMPH:  no palpable lymphadenopathy in the cervical, axillary or inguinal regions LUNGS: clear to auscultation and  No wheeze or crackles HEART/CVS: regular rate & rhythm and no murmurs; Left lower extremity swollen compared to the right.  ABDOMEN:abdomen soft, non-tender and normal bowel sounds Musculoskeletal:no cyanosis of digits and no clubbing  PSYCH: alert & oriented x 3 with fluent speech NEURO: no focal motor/sensory deficits SKIN:  no rashes or significant lesions  LABORATORY DATA:  I have reviewed the data as listed    Component Value Date/Time   NA 140 02/22/2017 0907   NA 139 11/04/2013 1257   K 3.8 02/22/2017 0907   K 4.3 03/05/2014 1156   CL 108 02/22/2017 0907   CL 106 11/04/2013 1257   CO2 27 02/22/2017 0907   CO2 26 11/04/2013 1257   GLUCOSE 97 02/22/2017 0907   GLUCOSE 96 11/04/2013 1257   BUN 30 (H) 02/22/2017 0907   BUN 13 11/04/2013 1257   CREATININE 1.10 (H) 02/22/2017 0907   CREATININE 0.90 11/04/2013 1257   CALCIUM 9.0 02/22/2017 0907   CALCIUM 8.5 11/04/2013 1257   PROT 6.6 02/22/2017 0907   PROT 6.8 11/04/2013 1257   ALBUMIN 3.6 02/22/2017 0907   ALBUMIN 3.5 11/04/2013 1257   AST 19 02/22/2017 0907   AST 25 11/04/2013 1257   ALT 15 02/22/2017 0907   ALT 20 11/04/2013 1257   ALKPHOS 34 (L) 02/22/2017 0907   ALKPHOS 64 11/04/2013 1257   BILITOT 0.7 02/22/2017 0907    BILITOT 0.5 11/04/2013 1257   GFRNONAA 44 (L) 02/22/2017 0907   GFRNONAA >60 11/04/2013 1257   GFRNONAA >60 03/29/2012 1650   GFRAA 52 (L) 02/22/2017 0907   GFRAA >60 11/04/2013 1257   GFRAA >60 03/29/2012 1650    No results found for: SPEP, UPEP  Lab Results  Component  Value Date   WBC 4.2 02/22/2017   NEUTROABS 2.4 02/22/2017   HGB 13.9 02/22/2017   HCT 40.7 02/22/2017   MCV 96.1 02/22/2017   PLT 224 02/22/2017      Chemistry      Component Value Date/Time   NA 140 02/22/2017 0907   NA 139 11/04/2013 1257   K 3.8 02/22/2017 0907   K 4.3 03/05/2014 1156   CL 108 02/22/2017 0907   CL 106 11/04/2013 1257   CO2 27 02/22/2017 0907   CO2 26 11/04/2013 1257   BUN 30 (H) 02/22/2017 0907   BUN 13 11/04/2013 1257   CREATININE 1.10 (H) 02/22/2017 0907   CREATININE 0.90 11/04/2013 1257      Component Value Date/Time   CALCIUM 9.0 02/22/2017 0907   CALCIUM 8.5 11/04/2013 1257   ALKPHOS 34 (L) 02/22/2017 0907   ALKPHOS 64 11/04/2013 1257   AST 19 02/22/2017 0907   AST 25 11/04/2013 1257   ALT 15 02/22/2017 0907   ALT 20 11/04/2013 1257   BILITOT 0.7 02/22/2017 0907   BILITOT 0.5 11/04/2013 1257       RADIOGRAPHIC STUDIES: I have personally reviewed the radiological images as listed and agreed with the findings in the report. No results found.   ASSESSMENT & PLAN:  Endometrial cancer ([place]) # Metastatic endometrial cancer to the bone- currently on Megace. Tolerating it well-question adrenal insufficiency [see discussion below]. DEC 2018- abdomen pelvis CT scan stable right iliac lesion.   #  No clinical progression noted. Continue Megace at this time.  December 2018 CT scan-STABLE.   #  Pain- improved. on fentanyl patch to 75/ and percocet 10 every 8-12 hours.  # Dizzy spells/orthostatic hypotension- improved; on midrodrine.improved.  # Left lower extremity swelling chronic/DVT- continue Eliquis;but at-2.5 mg twice daily.  # Metastatic lesions to the bone on  X-geva- every 4 weeks. Tolerating well.  Calcium normal; no concerns for osteonecrosis of the jaw. Continue ca+ vit D.  However given the absence of significant bony metastatic disease/stable disease recommend switching Xgeva to every 8 weeks.    # follow up in 8  weeks/labs-X-geva.  Orders Placed This Encounter  Procedures  . CBC with Differential    Standing Status:   Future    Standing Expiration Date:   02/22/2018  . Comprehensive metabolic panel    Standing Status:   Future    Standing Expiration Date:   02/22/2018  . CA 125    Standing Status:   Future    Standing Expiration Date:   02/22/2018      Cammie Sickle, MD 02/24/2017 6:11 PM

## 2017-03-23 ENCOUNTER — Other Ambulatory Visit: Payer: Self-pay | Admitting: *Deleted

## 2017-03-23 DIAGNOSIS — C7951 Secondary malignant neoplasm of bone: Secondary | ICD-10-CM

## 2017-03-23 DIAGNOSIS — C541 Malignant neoplasm of endometrium: Secondary | ICD-10-CM

## 2017-03-23 MED ORDER — OXYCODONE-ACETAMINOPHEN 10-325 MG PO TABS: 1.0000 | ORAL_TABLET | Freq: Three times a day (TID) | ORAL | 0 refills | Status: DC | PRN

## 2017-03-23 MED ORDER — MIDODRINE HCL 2.5 MG PO TABS: 2.5000 mg | ORAL_TABLET | Freq: Three times a day (TID) | ORAL | 3 refills | Status: AC

## 2017-03-28 ENCOUNTER — Ambulatory Visit: Payer: Medicare Other | Admitting: Obstetrics and Gynecology

## 2017-04-04 ENCOUNTER — Inpatient Hospital Stay: Payer: Medicare Other | Attending: Obstetrics and Gynecology | Admitting: Obstetrics and Gynecology

## 2017-04-04 VITALS — BP 113/71 | HR 75 | Temp 99.2°F | Ht 60.0 in | Wt 123.9 lb

## 2017-04-04 DIAGNOSIS — C7951 Secondary malignant neoplasm of bone: Secondary | ICD-10-CM | POA: Insufficient documentation

## 2017-04-04 DIAGNOSIS — Z7901 Long term (current) use of anticoagulants: Secondary | ICD-10-CM | POA: Diagnosis not present

## 2017-04-04 DIAGNOSIS — C7911 Secondary malignant neoplasm of bladder: Secondary | ICD-10-CM | POA: Insufficient documentation

## 2017-04-04 DIAGNOSIS — M25551 Pain in right hip: Secondary | ICD-10-CM

## 2017-04-04 DIAGNOSIS — N261 Atrophy of kidney (terminal): Secondary | ICD-10-CM | POA: Insufficient documentation

## 2017-04-04 DIAGNOSIS — C541 Malignant neoplasm of endometrium: Secondary | ICD-10-CM

## 2017-04-04 DIAGNOSIS — G893 Neoplasm related pain (acute) (chronic): Secondary | ICD-10-CM | POA: Diagnosis not present

## 2017-04-04 DIAGNOSIS — I82502 Chronic embolism and thrombosis of unspecified deep veins of left lower extremity: Secondary | ICD-10-CM | POA: Diagnosis not present

## 2017-04-04 DIAGNOSIS — Z853 Personal history of malignant neoplasm of breast: Secondary | ICD-10-CM | POA: Insufficient documentation

## 2017-04-04 NOTE — Progress Notes (Signed)
Right hip and right side of back ( pain level 8). No new Gyn changes noted. Unsure of any changes with medication the patient is unsure.

## 2017-04-04 NOTE — Progress Notes (Signed)
Gynecologic Oncology Visit   Referring Provider:  Dr. Rogue Bussing  Chief Concern: recurrent endometroid endometrial adenocarcinoma, stage Ib, grade 1 currently on Megace  Subjective:  Traci [name] is a 84 y.o. female who is seen in consultation from Dr. Oliva Bustard for recurrent endometroid endometrial adenocarcinoma, stage Ib, grade 1. Diagnosed with metastatic endometrial cancer to the bone. Stable disease on Megace therapy. She is also on X-geva- every 4 weeks, calcium, and vitamin D.   She saw Dr. Rogue Bussing on 02/12/2017 and had no evidence of clinical progress on exam.  DEC 2018- abdomen pelvis CT scan stable right iliac lesion. Given the absence of significant bony metastatic disease/stable disease he recommended switching Xgeva to every 8 weeks.   Her Dizzy spells/orthostatic hypotension have improved on midrodrine.  She continued on Eliquis for Left lower extremity swelling chronic/DVT.   She presents today for a pelvic exam. She  complains of increasing right hip/side pain.   Oncology History She was diagnosed with endometrial cancer in 12/13 and had recurrence in 2016.  01/2012  endometroid endometrial adenocarcinoma, stage Ib, grade 1, positive peritoneal cytology, no other risk factors or extra-uterine disease, TAH/BSO and staging, lymphocyst post-op, vaginal brachytherapy  In 2016 patient had abnormal mammogram of the left breast and biopsy was positive for invasive carcinoma ER+/PR+ treated with lumpectomy. Staging work up showed right hydronephrosis and right pelvic mass invading bone and muscle.  See below.  10/28/14 Cystoscopy done for stent placement and showed tumor invading bladder.  BIOPSY: WELL-DIFFERENTIATED ADENOCARCINOMA MORPHOLOGICALLY SIMILAR TO PREVIOUS ENDOMETRIAL ADENOCARCINOMA.  The stent could not be placed so 11/13/14 Right PCN placed in radiology.  CT scan 11/09/14 Bilateral pelvic lymphadenopathy, right side greater than left, consistent with metastatic  disease. Small soft tissue nodule involving the left vaginal cuff and 1.5 cm soft tissue nodule or lymphadenopathy in the sigmoid mesocolon, consistent with carcinoma. Right iliac bone metastasis with associated iliopsoas soft tissue mass. Severe right hydronephrosis and diffuse right renal parenchymal atrophy secondary to pelvic metastatic disease. No metastatic disease identified within the abdomen or chest.  She underwent external pelvic radiation and completed in 11/16 with relief in the pain,  Then started on Megace 40 mg qid in December of 2016 and developed deep vein thrombosis in the left lower extremity (February 09, 2015). Hematuria due toxeralto IVC filter placement on February 11, 2015 and xeralto stopped. Cystoscopy no evidence of bleeding, so patient has been started on ELOQUIS (March 15, 2015).  Patient again had a significant bleeding with hematuria with eloquis and was discontinued and Plavix started. June 2017- PET IMPROVED. August 25 2015- LLE DVT- start Eliquis [stop plavix];   PET/CT 09/28/15 CHEST A new 9 mm hypermetabolic mediastinal lymph node is seen in the precarinal region. This measures 9 mm on image 75/3, with SUV max of 4.5. No other hypermetabolic lymph nodes identified within the thorax. No suspicious pulmonary nodules seen on CT images. Mild emphysema and bilateral upper lobe scarring again noted.  ABDOMEN/PELVIS No abnormal hypermetabolic activity within the liver, pancreas, adrenal glands, or spleen. No hypermetabolic lymph nodes or other nodules seen within in abdomen or pelvis.  Previously seen soft tissue prominence along the left pelvic sidewall obturator space, and soft tissue nodularity along the left vaginal cuff are no longer visualized. No hypermetabolic peritoneal nodules identified. No evidence of ascites.  Right ureteral stent remains in appropriate position. No evidence of hydronephrosis. Diffuse right renal parenchymal atrophy again  noted. IVC filter remains in place. Sigmoid diverticulosis again demonstrated, without  evidence of diverticulitis. Prior hysterectomy.  SKELETON Decreased size of lytic bone lesion and associated soft tissue mass involving the right ilium. This currently measures 2.9 x 4.1 cm on image 184/3 compared to 3.4 x 4.4 cm previously. This shows low-grade metabolic activity with SUV max of 3.5. No other suspicious bone lesions identified. Lumbar spine fusion hardware again noted.  IMPRESSION: New 9 mm hypermetabolic precarinal mediastinal lymph node. Differential diagnosis includes metastatic disease and reactive/ inflammatory etiology. Recommend continued followup by chest CT with contrast in 3-6 months.  Decreased size of lytic bone metastasis involving the right ilium.  No other sites of metabolically active metastatic disease identified   01/03/2016 PET/CT result below.   FINDINGS:  Hypermetabolic low right paratracheal node and right iliac wing metastasis stable.  CHEST Left thyroid is mildly hypermetabolic, as before, nonspecific. Low right paratracheal lymph node measures 7 mm (CT image 80) with an SUV max of 4.7, stable from 09/28/2015. SKELETON An expansile lytic lesion in the right iliac wing is unchanged, measuring 3.5 x 3.7 cm and with an SUV max of 3.1, as before.  08/14/2016 IMPRESSION: No acute abnormality or finding to explain the patient's symptoms. No change in chronic lytic lesion in the right ilium. Sigmoid diverticulosis without diverticulitis.   TUMOR TESTING MOLECULAR STUDIES:  08/23/15- MICROSATELLITE INSTABILITY IMMUNOHISTOCHEMISTRY  MISMATCH REPAIR PROTEINS:   MLH1: Intact nuclear expression  MSH2: Intact nuclear expression  MSH6: Intact nuclear expression  PMS2: Intact nuclear expression   Interpretation: No loss of nuclear expression of mismatch repair  proteins: Low probability of MSI-H  Health maintenance: Her breast cancer screening is done  by her PCP, Dr. Doy Hutching, and  mammogram 10/12/2016 negative for malignancy  Problem List: Patient Active Problem List   Diagnosis Date Noted  . Cancer, metastatic to bone ([place]) 06/21/2016  . Pain and swelling of left lower leg 08/25/2015  . Seizure disorder ([place]) 04/06/2015  . Personal history of urinary infection 03/15/2015  . Cerebrovascular accident (CVA) ([place]) 02/18/2015  . Endometrial cancer ([place]) 11/18/2014  . Degeneration of intervertebral disc of lumbosacral region 11/14/2014  . Overflow incontinence 11/14/2014  . Pure hypercholesterolemia 11/14/2014  . Acquired hypothyroidism 10/20/2014  . DDD (degenerative disc disease), lumbosacral 10/20/2014  . Benign neoplasm of colon 10/20/2014  . Acid reflux 10/20/2014  . Esophageal stenosis 10/20/2014  . Bloodgood disease 10/20/2014  . History of colon polyps 10/20/2014  . H/O gastric ulcer 10/20/2014  . Healed or old pulmonary embolism 10/20/2014  . History of urinary anomaly 10/20/2014  . BP (high blood pressure) 10/20/2014  . Hypersomnia with sleep apnea 10/20/2014  . Multinodular goiter 10/20/2014  . Angina pectoris ([place]) 10/20/2014  . Seizure ([place]) 10/20/2014  . Digestive symptom 10/20/2014  . Incontinence overflow, urine 10/20/2014  . Dupuytren's contracture of foot 10/20/2014  . Hypercholesterolemia without hypertriglyceridemia 10/20/2014  . Cancer of left breast ([place]) 10/20/2014  . History of endometrial cancer 09/30/2014  . Lumbar radiculopathy 09/04/2014  . Deep vein thrombosis ([place]) 07/15/2013  . Deep vein thrombosis (DVT) ([place]) 07/15/2013  . Stroke [facility]) 04/07/2011    Past Medical History: Past Medical History:  Diagnosis Date  . Arthritis    Degenerative Disc Disease  . Bone cancer ([place])   . Breast cancer ([place]) 2016   LT LUMPECTOMY 03-2015  . Chronic kidney disease    HYDRONEPHROSIS  . Deep vein blood clot of left lower extremity ([place]) February 11, 2015   Left Leg  . Endometrial adenocarcinoma ([place]) 01/2012  stage Ib, grade 1, positive peritoneal cytology, no other risk factors or extra-uterine disease  . Gastric ulcer   . GERD (gastroesophageal reflux disease)   . Hearing loss   . Hiccups   . History of brachytherapy   . History of colon polyps   . History of DVT (deep vein thrombosis)   . History of esophageal stricture   . History of shingles   . Inguinal lymphocyst   . Mild aphasia   . Pain    SEVERE CHRONIC RIGHT LEG,HIP,FOOT  . PE (pulmonary embolism)    H/O  . Stroke [facility]) 04/06/2011   residual:  aphasia    Past Surgical History: Past Surgical History:  Procedure Laterality Date  . ABDOMINAL HYSTERECTOMY  December 2013   history of cervical cancer  . BACK SURGERY     fusion of L3&4  . BREAST BIOPSY Left 10/13/2014   POS  . BREAST LUMPECTOMY Left 03/2015  . CATARACT EXTRACTION W/ INTRAOCULAR LENS IMPLANT     right  . CYSTOSCOPY W/ RETROGRADES Right 10/28/2014   Procedure: CYSTOSCOPY WITH RETROGRADE PYELOGRAM ATTEMPT, UNABLE TO PASS ;  Surgeon: [name], MD;  Location: [facility];  Service: Urology;  Laterality: Right;  . CYSTOSCOPY W/ RETROGRADES Right 09/15/2015   Procedure: CYSTOSCOPY WITH RETROGRADE PYELOGRAM;  Surgeon: [name], MD;  Location: [facility];  Service: Urology;  Laterality: Right;  . CYSTOSCOPY W/ URETERAL STENT PLACEMENT Right 03/10/2015   Procedure: CYSTOSCOPY WITH STENT REPLACEMENT;  Surgeon: [name], MD;  Location: [facility];  Service: Urology;  Laterality: Right;  . CYSTOSCOPY W/ URETERAL STENT PLACEMENT Right 06/09/2015   Procedure: CYSTOSCOPY WITH STENT REPLACEMENT;  Surgeon: [name], MD;  Location: [facility];  Service: Urology;  Laterality: Right;  . CYSTOSCOPY W/ URETERAL STENT PLACEMENT Right 09/15/2015   Procedure: CYSTOSCOPY WITH STENT REPLACEMENT;  Surgeon: [name], MD;  Location: [facility];  Service: Urology;  Laterality: Right;  . CYSTOSCOPY WITH STENT PLACEMENT Right 10/28/2014   Procedure: BLADDER  BIOPSY WITH FULGERATION ;  Surgeon: [name], MD;  Location: [facility];  Service: Urology;  Laterality: Right;  . ESOPHAGEAL DILATION    . EYE SURGERY Bilateral    Cataract Extraction  . PARTIAL MASTECTOMY WITH NEEDLE LOCALIZATION Left 03/30/2015   Procedure: PARTIAL MASTECTOMY WITH NEEDLE LOCALIZATION;  Surgeon: [name], MD;  Location: [facility];  Service: General;  Laterality: Left;  . PERIPHERAL VASCULAR CATHETERIZATION N/A 02/11/2015   Procedure: IVC Filter Insertion;  Surgeon: Algernon Huxley, MD;  Location: [place] [facility];  Service: Cardiovascular;  Laterality: N/A;  . PORTACATH PLACEMENT Right 11/26/2014   Procedure: INSERTION PORT-A-CATH;  Surgeon: [name], MD;  Location: [facility];  Service: General;  Laterality: Right;  . SENTINEL NODE BIOPSY Left 03/30/2015   Procedure: SENTINEL NODE BIOPSY;  Surgeon: [name], MD;  Location: [facility];  Service: General;  Laterality: Left;  . thyroid removed    . THYROIDECTOMY, PARTIAL    . TONSILLECTOMY    . TOTAL ABDOMINAL HYSTERECTOMY W/ BILATERAL SALPINGOOPHORECTOMY Bilateral    staging biopsies    Past Gynecologic History:  See HPI  OB History:  OB History  Gravida Para Term Preterm AB Living  2            SAB TAB Ectopic Multiple Live Births               # Outcome Date GA Lbr Len/2nd Weight Sex Delivery Anes  PTL Lv  2 Gravida           1 Gravida               Family History: Family History  Problem Relation Age of Onset  . Heart attack Mother   . Breast cancer Maternal Aunt        x 2    Social History: Social History   Socioeconomic History  . Marital status: Married    Spouse name: Not on file  . Number of children: Not on file  . Years of education: Not on file  . Highest education level: Not on file  Social Needs  . Financial resource strain: Not on file  . Food insecurity - worry: Not on file  . Food insecurity - inability: Not on file  . Transportation needs -  medical: Not on file  . Transportation needs - non-medical: Not on file  Occupational History  . Not on file  Tobacco Use  . Smoking status: Never Smoker  . Smokeless tobacco: Never Used  Substance and Sexual Activity  . Alcohol use: No    Alcohol/week: 0.0 oz  . Drug use: No  . Sexual activity: Yes    Partners: Male  Other Topics Concern  . Not on file  Social History Narrative  . Not on file    Allergies: Allergies  Allergen Reactions  . Penicillins Rash    .Has patient had a PCN reaction causing immediate rash, facial/tongue/throat swelling, SOB or lightheadedness with hypotension: No Has patient had a PCN reaction causing severe rash involving mucus membranes or skin necrosis: No Has patient had a PCN reaction that required hospitalization: No Has patient had a PCN reaction occurring within the last 10 years: Unknown If all of the above answers are "NO", then may proceed with Cephalosporin use.   . Acetaminophen     Other reaction(s): Unknown  . Hydrocodone-Acetaminophen Nausea Only  . Novocain [Procaine] Other (See Comments) and Nausea And Vomiting    Other reaction(s): Unknown Chest pain  . Sulfa Antibiotics Other (See Comments)    CHEST PAIN  . Valium [Diazepam] Other (See Comments)    disorientation    Current Medications: Current Outpatient Medications  Medication Sig Dispense Refill  . apixaban (ELIQUIS) 2.5 MG TABS tablet Take 1 tablet (2.5 mg total) by mouth 2 (two) times daily. 60 tablet 4  . dexamethasone (DECADRON) 2 MG tablet One pill once a day with food. 30 tablet 1  . fentaNYL (DURAGESIC - DOSED MCG/HR) 75 MCG/HR Place 1 patch (75 mcg total) onto the skin every 3 (three) days. 10 patch 0  . gabapentin (NEURONTIN) 300 MG capsule Take 1 capsule (300 mg total) by mouth 3 (three) times daily. 90 capsule 2  . lidocaine-prilocaine (EMLA) cream Apply 1 application topically as needed. 30 g 3  . megestrol (MEGACE) 40 MG tablet Take 1 tablet (40 mg total) 4  (four) times daily by mouth. (Patient taking differently: Take 40 mg by mouth 2 (two) times daily. ) 120 tablet 2  . midodrine (PROAMATINE) 2.5 MG tablet Take 1 tablet (2.5 mg total) by mouth 3 (three) times daily with meals. 90 tablet 3  . Multiple Vitamins-Minerals (CENTRUM SILVER) tablet Take 1 tablet by mouth daily.    Marland Kitchen oxyCODONE-acetaminophen (PERCOCET) 10-325 MG tablet Take 1 tablet by mouth 3 (three) times daily as needed for pain. 90 tablet 0   No current facility-administered medications for this visit.     Review of  Systems - as per interval history General: no complaints  HEENT: no complaints  Lungs: no complaints  Cardiac: no complaints  GI: no complaints  GU: no complaints  Musculoskeletal: h/o back and right hip pain  Extremities: no complaints  Skin: no complaints  Neuro: neuropathic pain in her RLE due to back issues in the past - no complaints today  Endocrine: no complaints  Psych: h/o feeling sad but no complaints today      Objective:  Physical Examination:  BP 113/71 (BP Location: Right Arm, Patient Position: Sitting)   Pulse 75   Temp 99.2 F (37.3 C) (Tympanic)   Ht 5' (1.524 m)   Wt 123 lb 14.4 oz (56.2 kg)   BMI 24.20 kg/m    ECOG Performance Status: 2 - Symptomatic, <50% confined to bed  General appearance: alert and appears stated age HEENT:PERRLA, extra ocular movement intact and sclera clear, anicteric CV: RRR Lungs: BCTA  Lymph node survey: non-palpable inguinal nodes Abdomen: soft, non-tender, without masses or organomegaly, no hernias and well healed incision Extremities: extremities normal, atraumatic, no cyanosis or edema Neurological exam reveals alert, oriented, normal speech, no focal findings.  Pelvic: exam chaperoned by nurse;  Vulva: diffuse erythematous appearing vulva with no masses, tenderness or lesions; Vagina: agglutinated and length is only 4 cm, no tumor  palpated, no discharge appreciated at all; Adnexa: surgically  absent; Uterus and Cervix: surgically absent, vaginal cuff not visualized due to agglutination, speculum exam not even attempted;  No masses.  Rectal: confirmatory.    Assessment:  Traci [name] is a 84 y.o. female diagnosed with pelvic recurrence of stage IB grade 1 endometroid endometrial adenocarcinoma s/p surgery and brachytherapy 2013. She had a right pelvic side wall mass invading bone/bladder and hydronephrosis, and got a ureteral stent and external pelvic radiation in 11/16.  On adjuvant Megace and X-geva and has residual disease in pelvis, with partial response on PET/CT followed by stable CT scan. No evidence of disease on pelvic exam, but symptomatic with right hip/side pain concerning for progressive disease.  Left lower extremity swelling chronic/DVT- continue Eliquis. Has had issues with VTE and bleeding managed by Dr Rogue Bussing. IVC filter remains in place.    She has an atrophic right kidney and has right ureteral stent managed by Urology.  Dizziness improved on midrodrine   Plan:   Problem List Items Addressed This Visit      Genitourinary   Endometrial cancer ([place]) - Primary   Relevant Orders   CT Abdomen Pelvis Wo Contrast   CT Chest Wo Contrast    Other Visit Diagnoses    Right hip pain          Continue Megace and X-geva therapy per Dr Rogue Bussing.  Her recurrent grade I endometrial cancer is still persistent in the right pelvis and involves bone. She had a partial response to Megace with stable disease on follow up imaging.  But symptoms concerning for progression.   We discussed with Dr Rogue Bussing and recommend repeat imaging CT C/A/P to assess disease. She agrees with this plan. Given concern for renal toxicity we will not use contrast.   She will RTC to see [place] in 3 months for pelvic exam.  I personally reviewed the patient's history, completed key elements of her exam, and was involved in decision making in conjunction with Ms. Allen.   Gillis Ends, MD CC: Dr. Ouida Sills

## 2017-04-17 ENCOUNTER — Ambulatory Visit
Admission: RE | Admit: 2017-04-17 | Discharge: 2017-04-17 | Disposition: A | Discharge status: Home / Self Care | Payer: Medicare Other | Source: Ambulatory Visit | Attending: Nurse Practitioner | Admitting: Nurse Practitioner

## 2017-04-17 DIAGNOSIS — K5730 Diverticulosis of large intestine without perforation or abscess without bleeding: Secondary | ICD-10-CM | POA: Insufficient documentation

## 2017-04-17 DIAGNOSIS — M899 Disorder of bone, unspecified: Secondary | ICD-10-CM | POA: Diagnosis not present

## 2017-04-17 DIAGNOSIS — N261 Atrophy of kidney (terminal): Secondary | ICD-10-CM | POA: Diagnosis not present

## 2017-04-17 DIAGNOSIS — R911 Solitary pulmonary nodule: Secondary | ICD-10-CM | POA: Diagnosis not present

## 2017-04-17 DIAGNOSIS — I700 Atherosclerosis of aorta: Secondary | ICD-10-CM | POA: Insufficient documentation

## 2017-04-17 DIAGNOSIS — K8020 Calculus of gallbladder without cholecystitis without obstruction: Secondary | ICD-10-CM | POA: Diagnosis not present

## 2017-04-17 DIAGNOSIS — C541 Malignant neoplasm of endometrium: Secondary | ICD-10-CM | POA: Insufficient documentation

## 2017-04-18 ENCOUNTER — Other Ambulatory Visit: Payer: Self-pay | Admitting: Nurse Practitioner

## 2017-04-18 DIAGNOSIS — C541 Malignant neoplasm of endometrium: Secondary | ICD-10-CM

## 2017-04-18 NOTE — Progress Notes (Signed)
Called patient. Spoke to husband. Briefly discussed results of CT scans and discussed following up with PET scan. Husband agrees. Will keep follow-up with Dr. Rogue Bussing tomorrow to discuss results of CT in more detail.

## 2017-04-19 ENCOUNTER — Encounter: Payer: Self-pay | Admitting: Internal Medicine

## 2017-04-19 ENCOUNTER — Inpatient Hospital Stay (HOSPITAL_BASED_OUTPATIENT_CLINIC_OR_DEPARTMENT_OTHER): Payer: Medicare Other | Admitting: Internal Medicine

## 2017-04-19 ENCOUNTER — Inpatient Hospital Stay: Payer: Medicare Other | Attending: Internal Medicine

## 2017-04-19 ENCOUNTER — Inpatient Hospital Stay: Payer: Medicare Other

## 2017-04-19 ENCOUNTER — Other Ambulatory Visit: Payer: Self-pay

## 2017-04-19 VITALS — BP 123/79 | HR 66 | Temp 97.9°F | Resp 20 | Ht 60.0 in | Wt 122.0 lb

## 2017-04-19 DIAGNOSIS — I82409 Acute embolism and thrombosis of unspecified deep veins of unspecified lower extremity: Secondary | ICD-10-CM | POA: Insufficient documentation

## 2017-04-19 DIAGNOSIS — C7951 Secondary malignant neoplasm of bone: Secondary | ICD-10-CM | POA: Diagnosis not present

## 2017-04-19 DIAGNOSIS — G893 Neoplasm related pain (acute) (chronic): Secondary | ICD-10-CM | POA: Insufficient documentation

## 2017-04-19 DIAGNOSIS — Z9181 History of falling: Secondary | ICD-10-CM

## 2017-04-19 DIAGNOSIS — C50812 Malignant neoplasm of overlapping sites of left female breast: Secondary | ICD-10-CM

## 2017-04-19 DIAGNOSIS — I951 Orthostatic hypotension: Secondary | ICD-10-CM | POA: Diagnosis not present

## 2017-04-19 DIAGNOSIS — R911 Solitary pulmonary nodule: Secondary | ICD-10-CM | POA: Insufficient documentation

## 2017-04-19 DIAGNOSIS — Z7901 Long term (current) use of anticoagulants: Secondary | ICD-10-CM

## 2017-04-19 DIAGNOSIS — Z170 Estrogen receptor positive status [ER+]: Secondary | ICD-10-CM

## 2017-04-19 DIAGNOSIS — F0390 Unspecified dementia without behavioral disturbance: Secondary | ICD-10-CM

## 2017-04-19 DIAGNOSIS — Z95828 Presence of other vascular implants and grafts: Secondary | ICD-10-CM

## 2017-04-19 DIAGNOSIS — M21371 Foot drop, right foot: Secondary | ICD-10-CM

## 2017-04-19 DIAGNOSIS — R296 Repeated falls: Secondary | ICD-10-CM | POA: Diagnosis not present

## 2017-04-19 DIAGNOSIS — I825Z2 Chronic embolism and thrombosis of unspecified deep veins of left distal lower extremity: Secondary | ICD-10-CM

## 2017-04-19 DIAGNOSIS — C541 Malignant neoplasm of endometrium: Secondary | ICD-10-CM | POA: Insufficient documentation

## 2017-04-19 LAB — CBC WITH DIFFERENTIAL/PLATELET
Basophils Absolute: 0.1 10*3/uL (ref 0–0.1)
Basophils Relative: 1 %
Eosinophils Absolute: 0.2 10*3/uL (ref 0–0.7)
Eosinophils Relative: 4 %
HEMATOCRIT: 40.6 % (ref 35.0–47.0)
HEMOGLOBIN: 14 g/dL (ref 12.0–16.0)
LYMPHS PCT: 25 %
Lymphs Abs: 1.1 10*3/uL (ref 1.0–3.6)
MCH: 32.6 pg (ref 26.0–34.0)
MCHC: 34.6 g/dL (ref 32.0–36.0)
MCV: 94.5 fL (ref 80.0–100.0)
MONOS PCT: 9 %
Monocytes Absolute: 0.4 10*3/uL (ref 0.2–0.9)
NEUTROS ABS: 2.7 10*3/uL (ref 1.4–6.5)
NEUTROS PCT: 61 %
Platelets: 233 10*3/uL (ref 150–440)
RBC: 4.3 MIL/uL (ref 3.80–5.20)
RDW: 13 % (ref 11.5–14.5)
WBC: 4.3 10*3/uL (ref 3.6–11.0)

## 2017-04-19 LAB — COMPREHENSIVE METABOLIC PANEL
ALT: 14 U/L (ref 14–54)
ANION GAP: 8 (ref 5–15)
AST: 18 U/L (ref 15–41)
Albumin: 3.7 g/dL (ref 3.5–5.0)
Alkaline Phosphatase: 33 U/L — ABNORMAL LOW (ref 38–126)
BUN: 23 mg/dL — ABNORMAL HIGH (ref 6–20)
CHLORIDE: 108 mmol/L (ref 101–111)
CO2: 23 mmol/L (ref 22–32)
Calcium: 9.1 mg/dL (ref 8.9–10.3)
Creatinine, Ser: 1.32 mg/dL — ABNORMAL HIGH (ref 0.44–1.00)
GFR, EST AFRICAN AMERICAN: 41 mL/min — AB (ref >60)
GFR, EST NON AFRICAN AMERICAN: 36 mL/min — AB (ref >60)
Glucose, Bld: 124 mg/dL — ABNORMAL HIGH (ref 65–99)
POTASSIUM: 4 mmol/L (ref 3.5–5.1)
SODIUM: 139 mmol/L (ref 135–145)
Total Bilirubin: 0.8 mg/dL (ref 0.3–1.2)
Total Protein: 6.6 g/dL (ref 6.5–8.1)

## 2017-04-19 MED ORDER — DENOSUMAB 120 MG/1.7ML ~~LOC~~ SOLN
120.0000 mg | Freq: Once | SUBCUTANEOUS | Status: AC
  Administered 2017-04-19: 120 mg via SUBCUTANEOUS
  Filled 2017-04-19: qty 1.7

## 2017-04-19 MED ORDER — SODIUM CHLORIDE 0.9% FLUSH
10.0000 mL | Freq: Once | INTRAVENOUS | Status: AC
  Administered 2017-04-19: 10 mL via INTRAVENOUS
  Filled 2017-04-19: qty 10

## 2017-04-19 MED ORDER — PREDNISONE 20 MG PO TABS: 20.0000 mg | ORAL_TABLET | Freq: Every day | ORAL | 0 refills | Status: AC

## 2017-04-19 MED ORDER — HEPARIN SOD (PORK) LOCK FLUSH 100 UNIT/ML IV SOLN
500.0000 [IU] | Freq: Once | INTRAVENOUS | Status: AC
  Administered 2017-04-19: 500 [IU]
  Filled 2017-04-19: qty 5

## 2017-04-19 NOTE — Progress Notes (Signed)
[place] OFFICE PROGRESS NOTE  Patient Care Team: Idelle Crouch, MD as PCP - General (Unknown Physician Specialty) [name], MD as Referring Physician (Surgery) [name], MD as Consulting Physician (Urology)  Cancer of left breast [facility])   Staging form: Breast, AJCC 7th Edition     Clinical: Stage IA (T1b, N0, M0) - Signed by Forest Gleason, MD on 10/20/2014    Oncology History   . Patient has a previous history of endometrium carcinoma of endometrium stage IB disease status post bilateral salpingo-oophorectomy and radiation therapy 4. Right hydronephrosis detected on MRI scan off lumbosacral spine. Cystoscopy with attempted stent placement revealed bladder tumor in September of 2016 biopsies positive for recurrent endometrial cancer "ENDOMETROID"  5. Patient had stent placement in the right kidney Started radiation therapy 6. Patient has finished radiation therapy with relief in the pain (November, 2016) 7. Started on Megace in December of 2016 8 deep vein thrombosis in the left lower extremity (February 08, 2014) Hematuria due to xeralto IVC filter placement on February 11, 2015 She is off xeralto 9.condition recently had a cystoscopy no evidence of bleeding was found so patient has been started on ELOQUIS (March 15, 2015)  10. Patient again had a significant bleeding with hematuria with eloquis and has been discontinued; Patient is on Plavix. ON  Megace. AUG 2017- PET IMPROVED; mediastinal uptake- monitor for now.   FEB 2017-LEFT BREAST CA [9'0 clock- overlapping site] Estrogen receptor positive. Progesterone receptor positive. HER-2 receptor; Her 2neg; 5 mm tumor clinically stage IB N0 M0 tumor;s/p Lumpectomy; NO RT  # PET NOV 27th- STABLE right iliac lesions [increasing pain]/ right paratrac- refer to RT  # NOV 7th 2018- ? Adrenal insuff; STARTED MIDODRINE  # X-geva q 4 w; Jan 2019- every 8 w  # # August 25 2015- LLE  DVT- start Eliquis [stop plavix]; Jan 2019- Eliquis 2.5 mg BID  # MOLECULAR STUDIES: 08/23/15- MMR-STABLE.      Cancer of left breast ([place])   10/20/2014 Initial Diagnosis    Cancer of left breast       Endometrial cancer ([place])   INTERVAL HISTORY: Patient has mild to moderate dementia /accompanied by husband.  Traci [name] 84 y.o.  female pleasant patient above history of Metastatic endometrial cancer currently on Megace- Is here for follow-up/ accompanied by her husband.   Patient was recently evaluated by gynecology oncology for worsening right leg pain; and had a CT scan of the abdomen pelvis and chest.  she is here to review the findings.  Patient complains of "weakness in the right leg" .  Continues to have intermittent falls.  She continues to be on fentanyl patch. She is also needing to take breakthrough pain medication up to 2 times a day.  Dizzy spells are improved.  REVIEW OF SYSTEMS:  A complete 10 point review of system is done which is negative except mentioned above/history of present illness.   PAST MEDICAL HISTORY :  Past Medical History:  Diagnosis Date  . Arthritis    Degenerative Disc Disease  . Bone cancer ([place])   . Breast cancer ([place]) 2016   LT LUMPECTOMY 03-2015  . Chronic kidney disease    HYDRONEPHROSIS  . Deep vein blood clot of left lower extremity ([place]) February 11, 2015   Left Leg  . Endometrial adenocarcinoma ([place]) 01/2012    stage Ib, grade 1, positive peritoneal cytology, no other risk factors or extra-uterine disease  . Gastric ulcer   .  GERD (gastroesophageal reflux disease)   . Hearing loss   . Hiccups   . History of brachytherapy   . History of colon polyps   . History of DVT (deep vein thrombosis)   . History of esophageal stricture   . History of shingles   . Inguinal lymphocyst   . Mild aphasia   . Pain    SEVERE CHRONIC RIGHT LEG,HIP,FOOT  . PE (pulmonary embolism)    H/O  . Stroke ([place]) 04/06/2011   residual:  aphasia    PAST  SURGICAL HISTORY :   Past Surgical History:  Procedure Laterality Date  . ABDOMINAL HYSTERECTOMY  December 2013   history of cervical cancer  . BACK SURGERY     fusion of L3&4  . BREAST BIOPSY Left 10/13/2014   POS  . BREAST LUMPECTOMY Left 03/2015  . CATARACT EXTRACTION W/ INTRAOCULAR LENS IMPLANT     right  . CYSTOSCOPY W/ RETROGRADES Right 10/28/2014   Procedure: CYSTOSCOPY WITH RETROGRADE PYELOGRAM ATTEMPT, UNABLE TO PASS ;  Surgeon: [name], MD;  Location: [facility] OR[facility];  Service: Urology;  Laterality: Right;  . CYSTOSCOPY W/ RETROGRADES Right 09/15/2015   Procedure: CYSTOSCOPY WITH RETROGRADE PYELOGRAM;  Surgeon: [name], MD;  Location: [facility];  Service: Urology;  Laterality: Right;  . CYSTOSCOPY W/ URETERAL STENT PLACEMENT Right 03/10/2015   Procedure: CYSTOSCOPY WITH STENT REPLACEMENT;  Surgeon: [name], MD;  Location: [facility];  Service: Urology;  Laterality: Right;  . CYSTOSCOPY W/ URETERAL STENT PLACEMENT Right 06/09/2015   Procedure: CYSTOSCOPY WITH STENT REPLACEMENT;  Surgeon: [name], MD;  Location: [facility];  Service: Urology;  Laterality: Right;  . CYSTOSCOPY W/ URETERAL STENT PLACEMENT Right 09/15/2015   Procedure: CYSTOSCOPY WITH STENT REPLACEMENT;  Surgeon: [name], MD;  Location: [facility];  Service: Urology;  Laterality: Right;  . CYSTOSCOPY WITH STENT PLACEMENT Right 10/28/2014   Procedure: BLADDER BIOPSY WITH FULGERATION ;  Surgeon: [name], MD;  Location: [facility];  Service: Urology;  Laterality: Right;  . ESOPHAGEAL DILATION    . EYE SURGERY Bilateral    Cataract Extraction  . PARTIAL MASTECTOMY WITH NEEDLE LOCALIZATION Left 03/30/2015   Procedure: PARTIAL MASTECTOMY WITH NEEDLE LOCALIZATION;  Surgeon: [name], MD;  Location: [facility];  Service: General;  Laterality: Left;  . PERIPHERAL VASCULAR CATHETERIZATION N/A 02/11/2015   Procedure: IVC Filter Insertion;  Surgeon: Algernon Huxley, MD;  Location: [place] [facility];  Service: Cardiovascular;  Laterality: N/A;  . PORTACATH PLACEMENT Right 11/26/2014   Procedure: INSERTION PORT-A-CATH;  Surgeon: [name], MD;  Location: [facility];  Service: General;  Laterality: Right;  . SENTINEL NODE BIOPSY Left 03/30/2015   Procedure: SENTINEL NODE BIOPSY;  Surgeon: [name], MD;  Location: [facility];  Service: General;  Laterality: Left;  . thyroid removed    . THYROIDECTOMY, PARTIAL    . TONSILLECTOMY    . TOTAL ABDOMINAL HYSTERECTOMY W/ BILATERAL SALPINGOOPHORECTOMY Bilateral    staging biopsies    FAMILY HISTORY :   Family History  Problem Relation Age of Onset  . Heart attack Mother   . Breast cancer Maternal Aunt        x 2    SOCIAL HISTORY:   Social History   Tobacco Use  . Smoking status: Never Smoker  . Smokeless tobacco: Never Used  Substance Use Topics  . Alcohol use: No    Alcohol/week: 0.0 oz  . Drug use: No  ALLERGIES:  is allergic to penicillins; acetaminophen; hydrocodone-acetaminophen; novocain [procaine]; sulfa antibiotics; and valium [diazepam].  MEDICATIONS:  Current Outpatient Medications  Medication Sig Dispense Refill  . apixaban (ELIQUIS) 2.5 MG TABS tablet Take 1 tablet (2.5 mg total) by mouth 2 (two) times daily. 60 tablet 4  . dexamethasone (DECADRON) 2 MG tablet One pill once a day with food. 30 tablet 1  . fentaNYL (DURAGESIC - DOSED MCG/HR) 75 MCG/HR Place 1 patch (75 mcg total) onto the skin every 3 (three) days. 10 patch 0  . gabapentin (NEURONTIN) 300 MG capsule Take 1 capsule (300 mg total) by mouth 3 (three) times daily. (Patient taking differently: Take 300 mg by mouth 2 (two) times daily. ) 90 capsule 2  . lidocaine-prilocaine (EMLA) cream Apply 1 application topically as needed. 30 g 3  . megestrol (MEGACE) 40 MG tablet Take 1 tablet (40 mg total) 4 (four) times daily by mouth. (Patient taking differently: Take 40 mg by mouth 2 (two) times daily. ) 120 tablet 2  . midodrine  (PROAMATINE) 2.5 MG tablet Take 1 tablet (2.5 mg total) by mouth 3 (three) times daily with meals. 90 tablet 3  . Multiple Vitamins-Minerals (CENTRUM SILVER) tablet Take 1 tablet by mouth daily.    Marland Kitchen oxyCODONE-acetaminophen (PERCOCET) 10-325 MG tablet Take 1 tablet by mouth 3 (three) times daily as needed for pain. 90 tablet 0  . predniSONE (DELTASONE) 20 MG tablet Take 1 tablet (20 mg total) by mouth daily with breakfast. Once a day with food x 10 days. 10 tablet 0   No current facility-administered medications for this visit.     PHYSICAL EXAMINATION: ECOG PERFORMANCE STATUS: 1 - Symptomatic but completely ambulatory  BP 123/79   Pulse 66   Temp 97.9 F (36.6 C) (Tympanic)   Resp 20   Ht 5' (1.524 m)   Wt 122 lb (55.3 kg)   BMI 23.83 kg/m   Filed Weights   04/19/17 1028  Weight: 122 lb (55.3 kg)    GENERAL: [place] Caucasian female patient walking herself Alert, no distress and comfortable. She Is accompanied by her husband.  EYES: no pallor or icterus OROPHARYNX: no thrush or ulceration; good dentition  NECK: supple, no masses felt LYMPH:  no palpable lymphadenopathy in the cervical, axillary or inguinal regions LUNGS: clear to auscultation and  No wheeze or crackles HEART/CVS: regular rate & rhythm and no murmurs; Left lower extremity swollen compared to the right.  ABDOMEN:abdomen soft, non-tender and normal bowel sounds Musculoskeletal:no cyanosis of digits and no clubbing  PSYCH: alert & oriented x 3 with fluent speech NEURO: no focal motor/sensory deficits; mild weakness of flexion of the right foot [foot drop] SKIN:  no rashes or significant lesions  LABORATORY DATA:  I have reviewed the data as listed    Component Value Date/Time   NA 139 04/19/2017 0940   NA 139 11/04/2013 1257   K 4.0 04/19/2017 0940   K 4.3 03/05/2014 1156   CL 108 04/19/2017 0940   CL 106 11/04/2013 1257   CO2 23 04/19/2017 0940   CO2 26 11/04/2013 1257   GLUCOSE 124 (H)  04/19/2017 0940   GLUCOSE 96 11/04/2013 1257   BUN 23 (H) 04/19/2017 0940   BUN 13 11/04/2013 1257   CREATININE 1.32 (H) 04/19/2017 0940   CREATININE 0.90 11/04/2013 1257   CALCIUM 9.1 04/19/2017 0940   CALCIUM 8.5 11/04/2013 1257   PROT 6.6 04/19/2017 0940   PROT 6.8 11/04/2013 1257   ALBUMIN 3.7  04/19/2017 0940   ALBUMIN 3.5 11/04/2013 1257   AST 18 04/19/2017 0940   AST 25 11/04/2013 1257   ALT 14 04/19/2017 0940   ALT 20 11/04/2013 1257   ALKPHOS 33 (L) 04/19/2017 0940   ALKPHOS 64 11/04/2013 1257   BILITOT 0.8 04/19/2017 0940   BILITOT 0.5 11/04/2013 1257   GFRNONAA 36 (L) 04/19/2017 0940   GFRNONAA >60 11/04/2013 1257   GFRNONAA >60 03/29/2012 1650   GFRAA 41 (L) 04/19/2017 0940   GFRAA >60 11/04/2013 1257   GFRAA >60 03/29/2012 1650    No results found for: SPEP, UPEP  Lab Results  Component Value Date   WBC 4.3 04/19/2017   NEUTROABS 2.7 04/19/2017   HGB 14.0 04/19/2017   HCT 40.6 04/19/2017   MCV 94.5 04/19/2017   PLT 233 04/19/2017      Chemistry      Component Value Date/Time   NA 139 04/19/2017 0940   NA 139 11/04/2013 1257   K 4.0 04/19/2017 0940   K 4.3 03/05/2014 1156   CL 108 04/19/2017 0940   CL 106 11/04/2013 1257   CO2 23 04/19/2017 0940   CO2 26 11/04/2013 1257   BUN 23 (H) 04/19/2017 0940   BUN 13 11/04/2013 1257   CREATININE 1.32 (H) 04/19/2017 0940   CREATININE 0.90 11/04/2013 1257      Component Value Date/Time   CALCIUM 9.1 04/19/2017 0940   CALCIUM 8.5 11/04/2013 1257   ALKPHOS 33 (L) 04/19/2017 0940   ALKPHOS 64 11/04/2013 1257   AST 18 04/19/2017 0940   AST 25 11/04/2013 1257   ALT 14 04/19/2017 0940   ALT 20 11/04/2013 1257   BILITOT 0.8 04/19/2017 0940   BILITOT 0.5 11/04/2013 1257     IMPRESSION: 15 x 7 mm mass or nodule is noted in left upper lobe which appears to be enlarged compared to prior exam. This is concerning for metastatic disease or malignancy. Further evaluation with PET scan may be  performed.  Minimal cholelithiasis.  Stable right renal atrophy.  Sigmoid diverticulosis without inflammation.  Stable lytic expansile lesion seen in right iliac wing consistent with metastatic lesion.  Aortic Atherosclerosis (ICD10-I70.0).   Electronically Signed   By: Marijo Conception, M.D.   On: 04/17/2017 16:07  RADIOGRAPHIC STUDIES: I have personally reviewed the radiological images as listed and agreed with the findings in the report. No results found.   ASSESSMENT & PLAN:  Endometrial cancer [facility]) # Metastatic endometrial cancer to the bone- currently on Megace; March 2019 CT scan shows-stable right pelvic/ischial lesion stable; however shows a progressive /increasing size of the left upper lobe lung nodule.  PET scan is ordered that is pending at this time. t D. #Left upper lobe lung lesion-metastatic versus primary lung; await PET scan.  Will review tumor conference; biopsy versus local therapy like SBR T.  This will be reviewed at the tumor conference on 3/21-after the PET scan is available.  #Right hip pain-secondary to cancer/stable on fentanyl patch to 75/ and percocet 10 every 8-12 hours.  # Dizzy spells/orthostatic hypotension- improved; on midrodrine.improved.  # Left lower extremity swelling chronic/DVT- continue Eliquis;but at-2.5 mg twice daily.  # Metastatic lesions to the bone on X-geva- every 8 weeks tolerating well.  Calcium normal; no concerns for osteonecrosis of the jaw. Continue ca+ t B.  #Falls/secondary dementia/ right foot drop- ? Recommend PT eval/Dr.Shah.   # Await PET scan at next week/ 3/20th; Kristie/Lauren will discuss at tumor conference on 3/21;  will have pt follow up on 25th- no labs.   Cc; Dr.Shah.  No orders of the defined types were placed in this encounter.     Cammie Sickle, MD 04/22/2017 8:12 PM

## 2017-04-19 NOTE — Assessment & Plan Note (Addendum)
#   Metastatic endometrial cancer to the bone- currently on Megace; March 2019 CT scan shows-stable right pelvic/ischial lesion stable; however shows a progressive /increasing size of the left upper lobe lung nodule.  PET scan is ordered that is pending at this time. t D. #Left upper lobe lung lesion-metastatic versus primary lung; await PET scan.  Will review tumor conference; biopsy versus local therapy like SBR T.  This will be reviewed at the tumor conference on 3/21-after the PET scan is available.  #Right hip pain-secondary to cancer/stable on fentanyl patch to 75/ and percocet 10 every 8-12 hours.  # Dizzy spells/orthostatic hypotension- improved; on midrodrine.improved.  # Left lower extremity swelling chronic/DVT- continue Eliquis;but at-2.5 mg twice daily.  # Metastatic lesions to the bone on X-geva- every 8 weeks tolerating well.  Calcium normal; no concerns for osteonecrosis of the jaw. Continue ca+ t B.  #Falls/secondary dementia/ right foot drop- ? Recommend PT eval/Dr.Shah.   # Await PET scan at next week/ 3/20th; Kristie/Lauren will discuss at tumor conference on 3/21; will have pt follow up on 25th- no labs.   Cc; Dr.Shah.

## 2017-04-20 LAB — CA 125: CANCER ANTIGEN (CA) 125: 22.3 U/mL (ref 0.0–38.1)

## 2017-04-22 ENCOUNTER — Telehealth: Payer: Self-pay | Admitting: Internal Medicine

## 2017-04-22 NOTE — Telephone Encounter (Signed)
Lauren-I am on CME during this coming Thursday/Friday. Can you please present her case at the tumor conference on 3/21-after the PET scan is done?  Main question being-left upper lobe nodule metastatic versus primary lung?  How amenable is this to a biopsy?  If yes, what kind of biopsy?  And, ? RT  As you know-with her ongoing dementia/fraility-I just want to hear what radiology/pulmonology/Dr.Crystal have to say?   Let me know, if not Drue Dun can present.   Thanks, GB

## 2017-04-24 ENCOUNTER — Other Ambulatory Visit: Payer: Self-pay | Admitting: Nurse Practitioner

## 2017-04-24 DIAGNOSIS — C541 Malignant neoplasm of endometrium: Secondary | ICD-10-CM

## 2017-04-25 ENCOUNTER — Ambulatory Visit
Admission: RE | Admit: 2017-04-25 | Discharge: 2017-04-25 | Disposition: A | Discharge status: Home / Self Care | Payer: Medicare Other | Source: Ambulatory Visit | Attending: Nurse Practitioner | Admitting: Nurse Practitioner

## 2017-04-25 DIAGNOSIS — C541 Malignant neoplasm of endometrium: Secondary | ICD-10-CM

## 2017-04-30 ENCOUNTER — Encounter
Admission: RE | Admit: 2017-04-30 | Discharge: 2017-04-30 | Disposition: A | Discharge status: Home / Self Care | Payer: Medicare Other | Source: Ambulatory Visit | Attending: Nurse Practitioner | Admitting: Nurse Practitioner

## 2017-04-30 ENCOUNTER — Ambulatory Visit: Payer: Medicare Other | Admitting: Internal Medicine

## 2017-04-30 DIAGNOSIS — C541 Malignant neoplasm of endometrium: Secondary | ICD-10-CM | POA: Insufficient documentation

## 2017-04-30 LAB — GLUCOSE, CAPILLARY: GLUCOSE-CAPILLARY: 104 mg/dL — AB (ref 65–99)

## 2017-04-30 MED ORDER — FLUDEOXYGLUCOSE F - 18 (FDG) INJECTION
6.0200 | Freq: Once | INTRAVENOUS | Status: AC | PRN
  Administered 2017-04-30: 6.02 via INTRAVENOUS

## 2017-05-01 NOTE — Progress Notes (Signed)
FYI- PET results. You are seeing her in clinic on 05/02/17. Thanks. Ander Purpura

## 2017-05-02 ENCOUNTER — Encounter: Payer: Self-pay | Admitting: Internal Medicine

## 2017-05-02 ENCOUNTER — Inpatient Hospital Stay (HOSPITAL_BASED_OUTPATIENT_CLINIC_OR_DEPARTMENT_OTHER): Payer: Medicare Other | Admitting: Internal Medicine

## 2017-05-02 ENCOUNTER — Other Ambulatory Visit: Payer: Self-pay

## 2017-05-02 DIAGNOSIS — I82409 Acute embolism and thrombosis of unspecified deep veins of unspecified lower extremity: Secondary | ICD-10-CM

## 2017-05-02 DIAGNOSIS — R911 Solitary pulmonary nodule: Secondary | ICD-10-CM | POA: Diagnosis not present

## 2017-05-02 DIAGNOSIS — C7951 Secondary malignant neoplasm of bone: Secondary | ICD-10-CM | POA: Diagnosis not present

## 2017-05-02 DIAGNOSIS — G893 Neoplasm related pain (acute) (chronic): Secondary | ICD-10-CM

## 2017-05-02 DIAGNOSIS — C541 Malignant neoplasm of endometrium: Secondary | ICD-10-CM | POA: Diagnosis not present

## 2017-05-02 DIAGNOSIS — R296 Repeated falls: Secondary | ICD-10-CM

## 2017-05-02 MED ORDER — OXYCODONE-ACETAMINOPHEN 10-325 MG PO TABS: 1.0000 | ORAL_TABLET | Freq: Three times a day (TID) | ORAL | 0 refills | Status: DC | PRN

## 2017-05-02 NOTE — Assessment & Plan Note (Addendum)
#   Metastatic endometrial cancer to the bone- currently on Megace; March 2019 CT scan shows-stable right pelvic/ischial lesion stable; however shows a progressive /increasing size of the left upper lobe lung nodule.  PET scan March 2019-stable right pelvic/ischial lesion; only low-grade uptake in the left upper lobe lung nodule [see discussion below]  # With regards to endometrial cancer-disease seems to be stable; continue Megace.  #Left upper lobe branching nodule not significant uptake on PET scan-suggestive of mucus impaction versus others.  Discussed at the tumor conference-no biopsy/intervention recommended continued surveillance.  # Right hip pain-secondary to cancer/stable on fentanyl patch to 75/ and percocet 10 every 8-12 hours.  #Falls-question etiology; do not suspect for malignancy.  Question secondary to progressive dementia-recommend follow-up with Dr. Brigitte Pulse for further evaluation.  A long discussion the family regarding the concerns of fall while on anticoagulation   Could be catastrophic.  Recommend walking with a walker.  # Dizzy spells/orthostatic hypotension- improved; on midrodrine.overall improved  # Left lower extremity swelling chronic/DVT- continue Eliquis; but at-2.5 mg twice daily-falls.   # Metastatic lesions to the bone on X-geva- every 8 weeks tolerating well.  Calcium normal; no concerns for osteonecrosis of the jaw.  Continue calcium plus vitamin D.  # X-geva/lab in 1 month; follow with MD/lasb as planned.  #Prognosis overall poor [even though her metastatic endometrial cancer is stable]-given the recurrent falls/especially on anticoagulation/question secondary to worsening dementia-await Dr. Raul Del evaluation.  # I reviewed the blood work- with the patient in detail; also reviewed the imaging independently [as summarized above]; and with the patient in detail.   # 40 minutes face-to-face with the patient discussing the above plan of care; more than 50% of time  spent on prognosis/ natural history; counseling and coordination.   Cc; Dr.Shah.

## 2017-05-06 NOTE — Progress Notes (Signed)
[place] OFFICE PROGRESS NOTE  Patient Care Team: Idelle Crouch, MD as PCP - General (Unknown Physician Specialty) [name], MD as Referring Physician (Surgery) [name], MD as Consulting Physician (Urology)  Cancer of left breast [facility]   Staging form: Breast, AJCC 7th Edition     Clinical: Stage IA (T1b, N0, M0) - Signed by Forest Gleason, MD on 10/20/2014    Oncology History   . Patient has a previous history of endometrium carcinoma of endometrium stage IB disease status post bilateral salpingo-oophorectomy and radiation therapy.  4. Right hydronephrosis detected on MRI scan off lumbosacral spine. Cystoscopy with attempted stent placement revealed bladder tumor in September of 2016 biopsies positive for recurrent endometrial cancer "ENDOMETROID"  5. Patient had stent placement in the right kidney Started radiation therapy 6. Patient has finished radiation therapy with relief in the pain (November, 2016) 7. Started on Megace in December of 2016 8 deep vein thrombosis in the left lower extremity (February 08, 2014) Hematuria due to xeralto IVC filter placement on February 11, 2015 She is off xeralto 9.condition recently had a cystoscopy no evidence of bleeding was found so patient has been started on ELOQUIS (March 15, 2015)  10. Patient again had a significant bleeding with hematuria with eloquis and has been discontinued; Patient is on Plavix. ON  Megace. AUG 2017- PET IMPROVED; mediastinal uptake- monitor for now.   FEB 2017-LEFT BREAST CA [9'0 clock- overlapping site] Estrogen receptor positive. Progesterone receptor positive. HER-2 receptor; Her 2neg; 5 mm tumor clinically stage IB N0 M0 tumor;s/p Lumpectomy; NO RT  # PET NOV 27th- STABLE right iliac lesions [increasing pain]/ right paratrac- refer to RT  # NOV 7th 2018- ? Adrenal insuff; STARTED MIDODRINE  # X-geva q 4 w; Jan 2019- every 8 w  # # August 25 2015- LLE  DVT- start Eliquis [stop plavix]; Jan 2019- Eliquis 2.5 mg BID  # MOLECULAR STUDIES: 08/23/15- MMR-STABLE.      Cancer of left breast ([place])   10/20/2014 Initial Diagnosis    Cancer of left breast       Endometrial cancer ([place])   INTERVAL HISTORY: Patient has mild to moderate dementia /accompanied by husband/son and daughter  Traci [name] 84 y.o.  female pleasant patient above history of Metastatic endometrial cancer currently on Megace- Is here for follow-up/ accompanied by her husband/son and daughter.  She is here to review the results of her PET scan that was ordered given the new finding of left upper lobe nodule/and the worsening pain.  Patient continues to have intermittent falls.  She attributes this to tripping; denies any worsening dizzy spells/or lightheadedness.   She also complains of pain-although not localized to one part of the body.  She continues to be on fentanyl patch. She is also needing to take breakthrough pain medication up to 2 times a day.   Appetite is fair at best.  No nausea vomiting.  Denies any headaches.  REVIEW OF SYSTEMS:  A complete 10 point review of system is done which is negative except mentioned above/history of present illness.   PAST MEDICAL HISTORY :  Past Medical History:  Diagnosis Date  . Arthritis    Degenerative Disc Disease  . Bone cancer ([place])   . Breast cancer ([place]) 2016   LT LUMPECTOMY 03-2015  . Chronic kidney disease    HYDRONEPHROSIS  . Deep vein blood clot of left lower extremity ([place]) February 11, 2015   Left Leg  .  Endometrial adenocarcinoma ([place]) 01/2012    stage Ib, grade 1, positive peritoneal cytology, no other risk factors or extra-uterine disease  . Gastric ulcer   . GERD (gastroesophageal reflux disease)   . Hearing loss   . Hiccups   . History of brachytherapy   . History of colon polyps   . History of DVT (deep vein thrombosis)   . History of esophageal stricture   . History of shingles   . Inguinal  lymphocyst   . Mild aphasia   . Pain    SEVERE CHRONIC RIGHT LEG,HIP,FOOT  . PE (pulmonary embolism)    H/O  . Stroke ([place]) 04/06/2011   residual:  aphasia    PAST SURGICAL HISTORY :   Past Surgical History:  Procedure Laterality Date  . ABDOMINAL HYSTERECTOMY  December 2013   history of cervical cancer  . BACK SURGERY     fusion of L3&4  . BREAST BIOPSY Left 10/13/2014   POS  . BREAST LUMPECTOMY Left 03/2015  . CATARACT EXTRACTION W/ INTRAOCULAR LENS IMPLANT     right  . CYSTOSCOPY W/ RETROGRADES Right 10/28/2014   Procedure: CYSTOSCOPY WITH RETROGRADE PYELOGRAM ATTEMPT, UNABLE TO PASS ;  Surgeon: [name], MD;  Location: [facility] ORS;  Service: Urology;  Laterality: Right;  . CYSTOSCOPY W/ RETROGRADES Right 09/15/2015   Procedure: CYSTOSCOPY WITH RETROGRADE PYELOGRAM;  Surgeon: [name], MD;  Location: [facility] ORS;  Service: Urology;  Laterality: Right;  . CYSTOSCOPY W/ URETERAL STENT PLACEMENT Right 03/10/2015   Procedure: CYSTOSCOPY WITH STENT REPLACEMENT;  Surgeon: [name], MD;  Location: [facility] ORS;  Service: Urology;  Laterality: Right;  . CYSTOSCOPY W/ URETERAL STENT PLACEMENT Right 06/09/2015   Procedure: CYSTOSCOPY WITH STENT REPLACEMENT;  Surgeon: [name], MD;  Location: [facility] ORS;  Service: Urology;  Laterality: Right;  . CYSTOSCOPY W/ URETERAL STENT PLACEMENT Right 09/15/2015   Procedure: CYSTOSCOPY WITH STENT REPLACEMENT;  Surgeon: [name], MD;  Location: [facility] OR[facility];  Service: Urology;  Laterality: Right;  . CYSTOSCOPY WITH STENT PLACEMENT Right 10/28/2014   Procedure: BLADDER BIOPSY WITH FULGERATION ;  Surgeon: [name], MD;  Location: [facility] ORS;  Service: Urology;  Laterality: Right;  . ESOPHAGEAL DILATION    . EYE SURGERY Bilateral    Cataract Extraction  . PARTIAL MASTECTOMY WITH NEEDLE LOCALIZATION Left 03/30/2015   Procedure: PARTIAL MASTECTOMY WITH NEEDLE LOCALIZATION;  Surgeon: [name], MD;  Location: [facility]  ORS;  Service: General;  Laterality: Left;  . PERIPHERAL VASCULAR CATHETERIZATION N/A 02/11/2015   Procedure: IVC Filter Insertion;  Surgeon: Algernon Huxley, MD;  Location: [place] [facility];  Service: Cardiovascular;  Laterality: N/A;  . PORTACATH PLACEMENT Right 11/26/2014   Procedure: INSERTION PORT-A-CATH;  Surgeon: [name], MD;  Location: [facility] OR[facility];  Service: General;  Laterality: Right;  . SENTINEL NODE BIOPSY Left 03/30/2015   Procedure: SENTINEL NODE BIOPSY;  Surgeon: [name], MD;  Location: [facility] OR[facility];  Service: General;  Laterality: Left;  . thyroid removed    . THYROIDECTOMY, PARTIAL    . TONSILLECTOMY    . TOTAL ABDOMINAL HYSTERECTOMY W/ BILATERAL SALPINGOOPHORECTOMY Bilateral    staging biopsies    FAMILY HISTORY :   Family History  Problem Relation Age of Onset  . Heart attack Mother   . Breast cancer Maternal Aunt        x 2    SOCIAL HISTORY:   Social History   Tobacco Use  . Smoking status:  Never Smoker  . Smokeless tobacco: Never Used  Substance Use Topics  . Alcohol use: No    Alcohol/week: 0.0 oz  . Drug use: No    ALLERGIES:  is allergic to penicillins; acetaminophen; hydrocodone-acetaminophen; novocain [procaine]; sulfa antibiotics; and valium [diazepam].  MEDICATIONS:  Current Outpatient Medications  Medication Sig Dispense Refill  . apixaban (ELIQUIS) 2.5 MG TABS tablet Take 1 tablet (2.5 mg total) by mouth 2 (two) times daily. 60 tablet 4  . fentaNYL (DURAGESIC - DOSED MCG/HR) 75 MCG/HR Place 1 patch (75 mcg total) onto the skin every 3 (three) days. 10 patch 0  . gabapentin (NEURONTIN) 300 MG capsule Take 1 capsule (300 mg total) by mouth 3 (three) times daily. (Patient taking differently: Take 300 mg by mouth 2 (two) times daily. ) 90 capsule 2  . lidocaine-prilocaine (EMLA) cream Apply 1 application topically as needed. 30 g 3  . megestrol (MEGACE) 40 MG tablet Take 1 tablet (40 mg total) 4 (four) times daily by mouth. (Patient  taking differently: Take 40 mg by mouth 2 (two) times daily. ) 120 tablet 2  . midodrine (PROAMATINE) 2.5 MG tablet Take 1 tablet (2.5 mg total) by mouth 3 (three) times daily with meals. 90 tablet 3  . Multiple Vitamins-Minerals (CENTRUM SILVER) tablet Take 1 tablet by mouth daily.    Marland Kitchen oxyCODONE-acetaminophen (PERCOCET) 10-325 MG tablet Take 1 tablet by mouth 3 (three) times daily as needed for pain. 90 tablet 0  . predniSONE (DELTASONE) 20 MG tablet Take 1 tablet (20 mg total) by mouth daily with breakfast. Once a day with food x 10 days. 10 tablet 0   No current facility-administered medications for this visit.     PHYSICAL EXAMINATION: ECOG PERFORMANCE STATUS: 1 - Symptomatic but completely ambulatory  BP 133/80   Pulse 86   Temp 98 F (36.7 C) (Oral)   Resp 20   Ht 5' (1.524 m)   Wt 122 lb (55.3 kg)   BMI 23.83 kg/m   Filed Weights   05/02/17 1153  Weight: 122 lb (55.3 kg)    GENERAL: [place] Caucasian female patient walking herself Alert, no distress and comfortable. She Is accompanied by her husband.  EYES: no pallor or icterus OROPHARYNX: no thrush or ulceration; good dentition  NECK: supple, no masses felt LYMPH:  no palpable lymphadenopathy in the cervical, axillary or inguinal regions LUNGS: clear to auscultation and  No wheeze or crackles HEART/CVS: regular rate & rhythm and no murmurs; Left lower extremity swollen compared to the right.  ABDOMEN:abdomen soft, non-tender and normal bowel sounds Musculoskeletal:no cyanosis of digits and no clubbing  PSYCH: alert & oriented x 3 with fluent speech NEURO: no focal motor/sensory deficits; mild weakness of flexion of the right foot [foot drop] SKIN:  no rashes or significant lesions  LABORATORY DATA:  I have reviewed the data as listed    Component Value Date/Time   NA 139 04/19/2017 0940   NA 139 11/04/2013 1257   K 4.0 04/19/2017 0940   K 4.3 03/05/2014 1156   CL 108 04/19/2017 0940   CL 106  11/04/2013 1257   CO2 23 04/19/2017 0940   CO2 26 11/04/2013 1257   GLUCOSE 124 (H) 04/19/2017 0940   GLUCOSE 96 11/04/2013 1257   BUN 23 (H) 04/19/2017 0940   BUN 13 11/04/2013 1257   CREATININE 1.32 (H) 04/19/2017 0940   CREATININE 0.90 11/04/2013 1257   CALCIUM 9.1 04/19/2017 0940   CALCIUM 8.5 11/04/2013 1257  PROT 6.6 04/19/2017 0940   PROT 6.8 11/04/2013 1257   ALBUMIN 3.7 04/19/2017 0940   ALBUMIN 3.5 11/04/2013 1257   AST 18 04/19/2017 0940   AST 25 11/04/2013 1257   ALT 14 04/19/2017 0940   ALT 20 11/04/2013 1257   ALKPHOS 33 (L) 04/19/2017 0940   ALKPHOS 64 11/04/2013 1257   BILITOT 0.8 04/19/2017 0940   BILITOT 0.5 11/04/2013 1257   GFRNONAA 36 (L) 04/19/2017 0940   GFRNONAA >60 11/04/2013 1257   GFRNONAA >60 03/29/2012 1650   GFRAA 41 (L) 04/19/2017 0940   GFRAA >60 11/04/2013 1257   GFRAA >60 03/29/2012 1650    No results found for: SPEP, UPEP  Lab Results  Component Value Date   WBC 4.3 04/19/2017   NEUTROABS 2.7 04/19/2017   HGB 14.0 04/19/2017   HCT 40.6 04/19/2017   MCV 94.5 04/19/2017   PLT 233 04/19/2017      Chemistry      Component Value Date/Time   NA 139 04/19/2017 0940   NA 139 11/04/2013 1257   K 4.0 04/19/2017 0940   K 4.3 03/05/2014 1156   CL 108 04/19/2017 0940   CL 106 11/04/2013 1257   CO2 23 04/19/2017 0940   CO2 26 11/04/2013 1257   BUN 23 (H) 04/19/2017 0940   BUN 13 11/04/2013 1257   CREATININE 1.32 (H) 04/19/2017 0940   CREATININE 0.90 11/04/2013 1257      Component Value Date/Time   CALCIUM 9.1 04/19/2017 0940   CALCIUM 8.5 11/04/2013 1257   ALKPHOS 33 (L) 04/19/2017 0940   ALKPHOS 64 11/04/2013 1257   AST 18 04/19/2017 0940   AST 25 11/04/2013 1257   ALT 14 04/19/2017 0940   ALT 20 11/04/2013 1257   BILITOT 0.8 04/19/2017 0940   BILITOT 0.5 11/04/2013 1257     IMPRESSION: 15 x 7 mm mass or nodule is noted in left upper lobe which appears to be enlarged compared to prior exam. This is concerning  for metastatic disease or malignancy. Further evaluation with PET scan may be performed.  Minimal cholelithiasis.  Stable right renal atrophy.  Sigmoid diverticulosis without inflammation.  Stable lytic expansile lesion seen in right iliac wing consistent with metastatic lesion.  Aortic Atherosclerosis (ICD10-I70.0).   Electronically Signed   By: Marijo Conception, M.D.   On: 04/17/2017 16:07  RADIOGRAPHIC STUDIES: I have personally reviewed the radiological images as listed and agreed with the findings in the report. No results found.   ASSESSMENT & PLAN:  Endometrial cancer [facility]) # Metastatic endometrial cancer to the bone- currently on Megace; March 2019 CT scan shows-stable right pelvic/ischial lesion stable; however shows a progressive /increasing size of the left upper lobe lung nodule.  PET scan March 2019-stable right pelvic/ischial lesion; only low-grade uptake in the left upper lobe lung nodule [see discussion below]  # With regards to endometrial cancer-disease seems to be stable; continue Megace.  #Left upper lobe branching nodule not significant uptake on PET scan-suggestive of mucus impaction versus others.  Discussed at the tumor conference-no biopsy/intervention recommended continued surveillance.  # Right hip pain-secondary to cancer/stable on fentanyl patch to 75/ and percocet 10 every 8-12 hours.  #Falls-question etiology; do not suspect for malignancy.  Question secondary to progressive dementia-recommend follow-up with Dr. Brigitte Pulse for further evaluation.  A long discussion the family regarding the concerns of fall while on anticoagulation   Could be catastrophic.  Recommend walking with a walker.  # Dizzy spells/orthostatic hypotension- improved; on  midrodrine.overall improved  # Left lower extremity swelling chronic/DVT- continue Eliquis; but at-2.5 mg twice daily-falls.   # Metastatic lesions to the bone on X-geva- every 8 weeks tolerating well.   Calcium normal; no concerns for osteonecrosis of the jaw.  Continue calcium plus vitamin D.  # X-geva/lab in 1 month; follow with MD/lasb as planned.  #Prognosis overall poor [even though her metastatic endometrial cancer is stable]-given the recurrent falls/especially on anticoagulation/question secondary to worsening dementia-await Dr. Raul Del evaluation.  # I reviewed the blood work- with the patient in detail; also reviewed the imaging independently [as summarized above]; and with the patient in detail.   # 40 minutes face-to-face with the patient discussing the above plan of care; more than 50% of time spent on prognosis/ natural history; counseling and coordination.   Cc; Dr.Shah.  Orders Placed This Encounter  Procedures  . Basic metabolic panel    Standing Status:   Future    Standing Expiration Date:   05/03/2018      Cammie Sickle, MD 05/06/2017 9:30 AM

## 2017-05-11 ENCOUNTER — Other Ambulatory Visit: Payer: Self-pay | Admitting: Nurse Practitioner

## 2017-05-11 DIAGNOSIS — R413 Other amnesia: Secondary | ICD-10-CM

## 2017-05-11 DIAGNOSIS — C7951 Secondary malignant neoplasm of bone: Secondary | ICD-10-CM

## 2017-05-11 DIAGNOSIS — C541 Malignant neoplasm of endometrium: Secondary | ICD-10-CM

## 2017-05-21 ENCOUNTER — Ambulatory Visit
Admission: RE | Admit: 2017-05-21 | Discharge: 2017-05-21 | Disposition: A | Discharge status: Home / Self Care | Payer: Medicare Other | Source: Ambulatory Visit | Attending: Nurse Practitioner | Admitting: Nurse Practitioner

## 2017-05-21 DIAGNOSIS — C541 Malignant neoplasm of endometrium: Secondary | ICD-10-CM | POA: Diagnosis not present

## 2017-05-21 DIAGNOSIS — R413 Other amnesia: Secondary | ICD-10-CM

## 2017-05-21 DIAGNOSIS — C7951 Secondary malignant neoplasm of bone: Secondary | ICD-10-CM | POA: Diagnosis not present

## 2017-05-21 DIAGNOSIS — I6782 Cerebral ischemia: Secondary | ICD-10-CM | POA: Insufficient documentation

## 2017-05-30 ENCOUNTER — Inpatient Hospital Stay: Payer: Medicare Other

## 2017-05-30 ENCOUNTER — Inpatient Hospital Stay: Payer: Medicare Other | Attending: Internal Medicine

## 2017-05-30 DIAGNOSIS — C541 Malignant neoplasm of endometrium: Secondary | ICD-10-CM

## 2017-05-30 DIAGNOSIS — C7951 Secondary malignant neoplasm of bone: Secondary | ICD-10-CM | POA: Insufficient documentation

## 2017-05-30 LAB — BASIC METABOLIC PANEL
Anion gap: 7 (ref 5–15)
BUN: 23 mg/dL — ABNORMAL HIGH (ref 6–20)
CALCIUM: 9.1 mg/dL (ref 8.9–10.3)
CO2: 25 mmol/L (ref 22–32)
CREATININE: 1.29 mg/dL — AB (ref 0.44–1.00)
Chloride: 107 mmol/L (ref 101–111)
GFR calc Af Amer: 43 mL/min — ABNORMAL LOW (ref >60)
GFR calc non Af Amer: 37 mL/min — ABNORMAL LOW (ref >60)
GLUCOSE: 94 mg/dL (ref 65–99)
Potassium: 4.3 mmol/L (ref 3.5–5.1)
Sodium: 139 mmol/L (ref 135–145)

## 2017-05-30 MED ORDER — DENOSUMAB 120 MG/1.7ML ~~LOC~~ SOLN
120.0000 mg | Freq: Once | SUBCUTANEOUS | Status: AC
  Administered 2017-05-30: 120 mg via SUBCUTANEOUS

## 2017-06-11 ENCOUNTER — Ambulatory Visit: Payer: Medicare Other | Attending: Neurology

## 2017-06-11 VITALS — BP 129/63 | HR 70

## 2017-06-11 DIAGNOSIS — M6281 Muscle weakness (generalized): Secondary | ICD-10-CM | POA: Diagnosis present

## 2017-06-11 DIAGNOSIS — R2681 Unsteadiness on feet: Secondary | ICD-10-CM | POA: Diagnosis not present

## 2017-06-11 NOTE — Patient Instructions (Signed)
Access Code: [email or web address]  URL: [email or web address]  Date: 06/11/2017  Prepared by: Roxana Hires   Exercises  Sit to Stand with Arms Crossed - 5 reps - 2 sets - 1x daily - 7x weekly  Standing Romberg to 1/2 Tandem Stance - 3 reps - 30s on each side hold - 2x daily - 7x weekly

## 2017-06-12 NOTE — Therapy (Signed)
[place] MAIN [facility] SERVICES [address] [place], [place], [address] Phone: [phone]   Fax:  [phone]  Physical Therapy Evaluation  Patient Details  Name: Traci [name] MRN: [id number] Date of Birth: Mar 23, 1929 Referring Provider: Jennings Books   Encounter Date: 06/11/2017  PT End of Session - 06/11/17 1129    Visit Number  1    Number of Visits  17    Date for PT Re-Evaluation  08/06/17    Authorization Type  progress note 1/10    PT Start Time  1108    PT Stop Time  1155    PT Time Calculation (min)  47 min    Equipment Utilized During Treatment  Gait belt    Activity Tolerance  Patient tolerated treatment well    Behavior During Therapy  --       Past Medical History:  Diagnosis Date  . Arthritis    Degenerative Disc Disease  . Bone cancer ([place])   . Breast cancer ([place]) 2016   LT LUMPECTOMY 03-2015  . Chronic kidney disease    HYDRONEPHROSIS  . Deep vein blood clot of left lower extremity ([place]) February 11, 2015   Left Leg  . Endometrial adenocarcinoma ([place]) 01/2012    stage Ib, grade 1, positive peritoneal cytology, no other risk factors or extra-uterine disease  . Gastric ulcer   . GERD (gastroesophageal reflux disease)   . Hearing loss   . Hiccups   . History of brachytherapy   . History of colon polyps   . History of DVT (deep vein thrombosis)   . History of esophageal stricture   . History of shingles   . Inguinal lymphocyst   . Mild aphasia   . Pain    SEVERE CHRONIC RIGHT LEG,HIP,FOOT  . PE (pulmonary embolism)    H/O  . Stroke [facility]) 04/06/2011   residual:  aphasia    Past Surgical History:  Procedure Laterality Date  . ABDOMINAL HYSTERECTOMY  December 2013   history of cervical cancer  . BACK SURGERY     fusion of L3&4  . BREAST BIOPSY Left 10/13/2014   POS  . BREAST LUMPECTOMY Left 03/2015  . CATARACT EXTRACTION W/ INTRAOCULAR LENS IMPLANT     right  . CYSTOSCOPY W/ RETROGRADES Right 10/28/2014    Procedure: CYSTOSCOPY WITH RETROGRADE PYELOGRAM ATTEMPT, UNABLE TO PASS ;  Surgeon: [name], MD;  Location: [facility] ORS;  Service: Urology;  Laterality: Right;  . CYSTOSCOPY W/ RETROGRADES Right 09/15/2015   Procedure: CYSTOSCOPY WITH RETROGRADE PYELOGRAM;  Surgeon: [name], MD;  Location: [facility] OR[facility];  Service: Urology;  Laterality: Right;  . CYSTOSCOPY W/ URETERAL STENT PLACEMENT Right 03/10/2015   Procedure: CYSTOSCOPY WITH STENT REPLACEMENT;  Surgeon: [name], MD;  Location: [facility] OR[facility];  Service: Urology;  Laterality: Right;  . CYSTOSCOPY W/ URETERAL STENT PLACEMENT Right 06/09/2015   Procedure: CYSTOSCOPY WITH STENT REPLACEMENT;  Surgeon: [name], MD;  Location: [facility];  Service: Urology;  Laterality: Right;  . CYSTOSCOPY W/ URETERAL STENT PLACEMENT Right 09/15/2015   Procedure: CYSTOSCOPY WITH STENT REPLACEMENT;  Surgeon: [name], MD;  Location: [facility];  Service: Urology;  Laterality: Right;  . CYSTOSCOPY WITH STENT PLACEMENT Right 10/28/2014   Procedure: BLADDER BIOPSY WITH FULGERATION ;  Surgeon: [name], MD;  Location: [facility];  Service: Urology;  Laterality: Right;  . ESOPHAGEAL DILATION    . EYE SURGERY Bilateral    Cataract Extraction  .  PARTIAL MASTECTOMY WITH NEEDLE LOCALIZATION Left 03/30/2015   Procedure: PARTIAL MASTECTOMY WITH NEEDLE LOCALIZATION;  Surgeon: [name], MD;  Location: [facility];  Service: General;  Laterality: Left;  . PERIPHERAL VASCULAR CATHETERIZATION N/A 02/11/2015   Procedure: IVC Filter Insertion;  Surgeon: Algernon Huxley, MD;  Location: [place] [facility];  Service: Cardiovascular;  Laterality: N/A;  . PORTACATH PLACEMENT Right 11/26/2014   Procedure: INSERTION PORT-A-CATH;  Surgeon: [name], MD;  Location: [facility];  Service: General;  Laterality: Right;  . SENTINEL NODE BIOPSY Left 03/30/2015   Procedure: SENTINEL NODE BIOPSY;  Surgeon: [name], MD;  Location: [facility];   Service: General;  Laterality: Left;  . thyroid removed    . THYROIDECTOMY, PARTIAL    . TONSILLECTOMY    . TOTAL ABDOMINAL HYSTERECTOMY W/ BILATERAL SALPINGOOPHORECTOMY Bilateral    staging biopsies    Vitals:   06/11/17 1118  BP: 129/63  Pulse: 70  SpO2: 100%     Subjective Assessment - 06/12/17 1547    Subjective  Imbalance    Patient is accompained by:  Family member Husband    Pertinent History  [name] is a 84 y.o. female referred for imbalance and frequent falls. She is AOx1 at time of initial evaluation and husband reports that this is her baseline. She has difficulty with word finding. She has a history of CVA with R sided deficits initially but husband states no further weakness. Multiple lacunar infarcts found on MRI. Patient diagnosed with breast, abdominal cancer recently and has had several rounds of radiation. Her husband reports frequent falls, at least 14 in the last month. She would like to work on her balance to prevent falls. Multiple comorbid conditions.     Limitations  Walking    Diagnostic tests  MRI Stable noncontrast MRI appearance the brain since 2018. Scattered chronic small vessel ischemic sequelae which overall is mild for age    Patient Stated Goals  Decrease falls    Currently in Pain?  No/denies         [facility] PT Assessment - 06/11/17 1112      Assessment   Medical Diagnosis  Imbalance    Referring Provider  Jennings Books    Onset Date/Surgical Date  06/11/16 Worsening over the last year    Hand Dominance  Right    Next MD Visit  Not reported    Prior Therapy  None reported      Precautions   Precautions  Fall      Restrictions   Weight Bearing Restrictions  No      Balance Screen   Has the patient fallen in the past 6 months  Yes    How many times?  14 in the last month    Has the patient had a decrease in activity level because of a fear of falling?   Yes    Is the patient reluctant to leave their home because of a fear of falling?    No      Home Social worker  Private residence    Living Arrangements  Spouse/significant other    Available Help at Discharge  Family    Type of [place] to enter    Entrance Stairs-Number of Steps  2    Entrance Stairs-Rails  Right    [place]  One level      Prior Function   Level of  Independence  Needs assistance with homemaking    Vocation  Retired      Associate Professor   Overall Cognitive Status  History of cognitive impairments - at baseline AOx1, partially oriented to place with cues    Memory  Impaired      Observation/Other Assessments   Other Surveys   Other Surveys    Activities of Balance Confidence Scale (ABC Scale)   41.3%    Dizziness Handicap Inventory (DHI)   68/100      Posture/Postural Control   Posture Comments  Significant forward kyphosis with L thoracic covex scoliosis      ROM / Strength   AROM / PROM / Strength  Strength;AROM      AROM   Overall AROM Comments  Chronic limitation in bilateral shoulder flexion and abdcution      Strength   Overall Strength Comments  Grossly 4+/5 throughout bilateral UE/LE without focal weakness identified      Standardized Balance Assessment   Standardized Balance Assessment  Timed Up and Go Test;Five Times Sit to Stand;10 meter walk test    Five times sit to stand comments   23.8s    10 Meter Walk  Self-selected: 24.5s, Fastest: 18.0s      Berg Balance Test   Sit to Stand  --      Timed Up and Go Test   TUG  Normal TUG    Normal TUG (seconds)  36.4                   Objective measurements completed on examination: See above findings.     TREATMENT  Neuromuscular Re-education  Modified tandem balance alternating forward LE x 30s each; Sit to stand without UE support x 10; Written handout with exercises provided to patient and husband with verbal instruction about how to complete safely.          PT Education - 06/11/17 1129    Education  provided  Yes    Education Details  HEP and plan of care,     Person(s) Educated  Patient;Spouse    Methods  Explanation;Handout    Comprehension  Verbalized understanding       PT Short Term Goals - 06/12/17 1553      PT SHORT TERM GOAL #1   Title  Pt will be independent with HEP in order to improve strength and balance in order to decrease fall risk and improve function at home and work.     Time  8    Period  Weeks    Status  New    Target Date  08/06/17        PT Long Term Goals - 06/12/17 1554      PT LONG TERM GOAL #1   Title  Pt will decrease 5TSTS by at least 3 seconds in order to demonstrate clinically significant improvement in LE strength     Baseline  06/11/17: 23.8s    Time  8    Period  Weeks    Status  New    Target Date  08/06/17      PT LONG TERM GOAL #2   Title  Pt will increase 10MWT by at least 0.13 m/s in order to demonstrate clinically significant improvement in community ambulation.    Baseline  06/11/17: self selected 24.5=0.41 m/s, fastest: 18.0=0.56 m/s     Time  8    Period  Weeks    Status  New    Target Date  08/06/17      PT LONG TERM GOAL #3   Title  Pt will decrease TUG to below 14 seconds in order to demonstrate decreased fall risk     Baseline  06/11/17: 36.4s    Time  8    Period  Weeks    Status  New    Target Date  08/06/17             Plan - 06/12/17 1548    Clinical Impression Statement  Pt is a pleasant 84 yo female referred for imbalance and frequent falls. She has word finding difficulty and memory issues. Husband is present throughout entirety of session and supplements significant portions or systems review and history. No focal weakness identified with strength testing. Function weakness noted with TUG, Five Time Sit to Stand, and 69m gait speed which are all abnormal for age/gender norms. ABC is 41.3% indicating very low balance confidence. Pt will benefit from PT services to address deficits in strength, balance, and  mobility in order to return to full function at home.     History and Personal Factors relevant to plan of care:   2 personal factors/comorbidities, 3 body systems/activity limitations/participation restrictions      Clinical Presentation  Unstable    Clinical Decision Making  Moderate    Rehab Potential  Fair    Clinical Impairments Affecting Rehab Potential  positive: motivation, support from husband; negative: cognitive impairments, age    PT Frequency  2x / week    PT Duration  8 weeks    PT Treatment/Interventions  Aquatic Therapy;Iontophoresis 4mg /ml Dexamethasone;ADLs/Self Care Home Management;Canalith Repostioning;Cryotherapy;Electrical Stimulation;Moist Heat;Traction;Ultrasound;Gait training;DME Instruction;Functional mobility training;Therapeutic activities;Therapeutic exercise;Balance training;Neuromuscular re-education;Stair training;Patient/family education;Manual techniques;Energy conservation;Vestibular    PT Next Visit Plan  BERG, 6MWT?, review HEP, progress balance and strengthening    PT Home Exercise Plan  modified tandem balance with guarding by husband, sit to stand without UE support    Consulted and Agree with Plan of Care  Patient;Family member/caregiver    Family Member Consulted  Husband       Patient will benefit from skilled therapeutic intervention in order to improve the following deficits and impairments:  Abnormal gait, Decreased balance, Decreased strength, Decreased knowledge of use of DME, Difficulty walking  Visit Diagnosis: Unsteadiness on feet - Plan: PT plan of care cert/re-cert  Muscle weakness (generalized) - Plan: PT plan of care cert/re-cert     Problem List Patient Active Problem List   Diagnosis Date Noted  . Cancer, metastatic to bone ([place]) 06/21/2016  . Pain and swelling of left lower leg 08/25/2015  . Seizure disorder ([place]) 04/06/2015  . Personal history of urinary infection 03/15/2015  . Cerebrovascular accident (CVA) ([place]) 02/18/2015   . Endometrial cancer ([place]) 11/18/2014  . Degeneration of intervertebral disc of lumbosacral region 11/14/2014  . Overflow incontinence 11/14/2014  . Pure hypercholesterolemia 11/14/2014  . Acquired hypothyroidism 10/20/2014  . DDD (degenerative disc disease), lumbosacral 10/20/2014  . Benign neoplasm of colon 10/20/2014  . Acid reflux 10/20/2014  . Esophageal stenosis 10/20/2014  . Bloodgood disease 10/20/2014  . History of colon polyps 10/20/2014  . H/O gastric ulcer 10/20/2014  . Healed or old pulmonary embolism 10/20/2014  . History of urinary anomaly 10/20/2014  . BP (high blood pressure) 10/20/2014  . Hypersomnia with sleep apnea 10/20/2014  . Multinodular goiter 10/20/2014  . Angina pectoris ([place]) 10/20/2014  . Seizure ([place]) 10/20/2014  . Digestive symptom 10/20/2014  . Incontinence overflow, urine 10/20/2014  . Dupuytren's  contracture of foot 10/20/2014  . Hypercholesterolemia without hypertriglyceridemia 10/20/2014  . Cancer of left breast ([place]) 10/20/2014  . History of endometrial cancer 09/30/2014  . Lumbar radiculopathy 09/04/2014  . Deep vein thrombosis ([place]) 07/15/2013  . Deep vein thrombosis (DVT) ([place]) 07/15/2013  . Stroke [facility]) 04/07/2011   Phillips Grout PT, DPT   Mataio Mele 06/12/2017, 4:01 PM  [place] MAIN [facility] SERVICES [address] [place], [place], [address] Phone: [phone]   Fax:  [phone]  Name: SYRIA KESTNER MRN: [id number] Date of Birth: 1929/04/29

## 2017-06-15 ENCOUNTER — Ambulatory Visit: Payer: Medicare Other | Admitting: Physical Therapy

## 2017-06-16 ENCOUNTER — Other Ambulatory Visit: Payer: Self-pay | Admitting: Internal Medicine

## 2017-06-19 ENCOUNTER — Other Ambulatory Visit: Payer: Self-pay | Admitting: *Deleted

## 2017-06-19 ENCOUNTER — Ambulatory Visit: Payer: Medicare Other

## 2017-06-19 VITALS — BP 141/68 | HR 71 | Temp 98.3°F

## 2017-06-19 DIAGNOSIS — R2681 Unsteadiness on feet: Secondary | ICD-10-CM | POA: Diagnosis not present

## 2017-06-19 DIAGNOSIS — M6281 Muscle weakness (generalized): Secondary | ICD-10-CM

## 2017-06-19 DIAGNOSIS — C541 Malignant neoplasm of endometrium: Secondary | ICD-10-CM

## 2017-06-19 DIAGNOSIS — C7951 Secondary malignant neoplasm of bone: Secondary | ICD-10-CM

## 2017-06-19 MED ORDER — FENTANYL 75 MCG/HR TD PT72: 75.0000 ug | MEDICATED_PATCH | TRANSDERMAL | 0 refills | Status: DC

## 2017-06-19 NOTE — Addendum Note (Signed)
Addended by: Sabino Gasser on: 06/19/2017 09:24 AM   Modules accepted: Orders

## 2017-06-19 NOTE — Therapy (Signed)
[place] MAIN [facility] SERVICES [address] [place], [place], [address] Phone: [phone]   Fax:  [phone]  Physical Therapy Treatment  Patient Details  Name: Traci [name] MRN: [id number] Date of Birth: [birth year] Referring Provider: Jennings Books   Encounter Date: 06/19/2017  PT End of Session - 06/19/17 1108    Visit Number  2    Number of Visits  17    Date for PT Re-Evaluation  08/06/17    Authorization Type  progress note 2/10    PT Start Time  1115    PT Stop Time  1200    PT Time Calculation (min)  45 min    Equipment Utilized During Treatment  Gait belt    Activity Tolerance  Patient tolerated treatment well       Past Medical History:  Diagnosis Date  . Arthritis    Degenerative Disc Disease  . Bone cancer ([place])   . Breast cancer ([place]) 2016   LT LUMPECTOMY 03-2015  . Chronic kidney disease    HYDRONEPHROSIS  . Deep vein blood clot of left lower extremity ([place]) February 11, 2015   Left Leg  . Endometrial adenocarcinoma ([place]) 01/2012    stage Ib, grade 1, positive peritoneal cytology, no other risk factors or extra-uterine disease  . Gastric ulcer   . GERD (gastroesophageal reflux disease)   . Hearing loss   . Hiccups   . History of brachytherapy   . History of colon polyps   . History of DVT (deep vein thrombosis)   . History of esophageal stricture   . History of shingles   . Inguinal lymphocyst   . Mild aphasia   . Pain    SEVERE CHRONIC RIGHT LEG,HIP,FOOT  . PE (pulmonary embolism)    H/O  . Stroke [facility]) 04/06/2011   residual:  aphasia    Past Surgical History:  Procedure Laterality Date  . ABDOMINAL HYSTERECTOMY  December 2013   history of cervical cancer  . BACK SURGERY     fusion of L3&4  . BREAST BIOPSY Left 10/13/2014   POS  . BREAST LUMPECTOMY Left 03/2015  . CATARACT EXTRACTION W/ INTRAOCULAR LENS IMPLANT     right  . CYSTOSCOPY W/ RETROGRADES Right 10/28/2014   Procedure: CYSTOSCOPY WITH  RETROGRADE PYELOGRAM ATTEMPT, UNABLE TO PASS ;  Surgeon: [name], MD;  Location: [facility];  Service: Urology;  Laterality: Right;  . CYSTOSCOPY W/ RETROGRADES Right 09/15/2015   Procedure: CYSTOSCOPY WITH RETROGRADE PYELOGRAM;  Surgeon: [name], MD;  Location: [facility];  Service: Urology;  Laterality: Right;  . CYSTOSCOPY W/ URETERAL STENT PLACEMENT Right 03/10/2015   Procedure: CYSTOSCOPY WITH STENT REPLACEMENT;  Surgeon: [name], MD;  Location: [facility];  Service: Urology;  Laterality: Right;  . CYSTOSCOPY W/ URETERAL STENT PLACEMENT Right 06/09/2015   Procedure: CYSTOSCOPY WITH STENT REPLACEMENT;  Surgeon: [name], MD;  Location: [facility];  Service: Urology;  Laterality: Right;  . CYSTOSCOPY W/ URETERAL STENT PLACEMENT Right 09/15/2015   Procedure: CYSTOSCOPY WITH STENT REPLACEMENT;  Surgeon: [name], MD;  Location: [facility];  Service: Urology;  Laterality: Right;  . CYSTOSCOPY WITH STENT PLACEMENT Right 10/28/2014   Procedure: BLADDER BIOPSY WITH FULGERATION ;  Surgeon: [name], MD;  Location: [facility];  Service: Urology;  Laterality: Right;  . ESOPHAGEAL DILATION    . EYE SURGERY Bilateral    Cataract Extraction  . PARTIAL MASTECTOMY WITH NEEDLE LOCALIZATION Left  03/30/2015   Procedure: PARTIAL MASTECTOMY WITH NEEDLE LOCALIZATION;  Surgeon: [name], MD;  Location: [facility];  Service: General;  Laterality: Left;  . PERIPHERAL VASCULAR CATHETERIZATION N/A 02/11/2015   Procedure: IVC Filter Insertion;  Surgeon: Algernon Huxley, MD;  Location: [facility];  Service: Cardiovascular;  Laterality: N/A;  . PORTACATH PLACEMENT Right 11/26/2014   Procedure: INSERTION PORT-A-CATH;  Surgeon: [name], MD;  Location: [facility];  Service: General;  Laterality: Right;  . SENTINEL NODE BIOPSY Left 03/30/2015   Procedure: SENTINEL NODE BIOPSY;  Surgeon: [name], MD;  Location: [facility];  Service: General;   Laterality: Left;  . thyroid removed    . THYROIDECTOMY, PARTIAL    . TONSILLECTOMY    . TOTAL ABDOMINAL HYSTERECTOMY W/ BILATERAL SALPINGOOPHORECTOMY Bilateral    staging biopsies    Vitals:   06/19/17 1116  BP: (!) 141/68  Pulse: 71  Temp: 98.3 F (36.8 C)  SpO2: 100%    Subjective Assessment - 06/19/17 1107    Subjective  Husband denies any falls since the initial evaluation. He reports that she has a "little cold" today. Denies any shortness of breath. Husband states that she has been having increased problems with her cognition over the last few days. Pt denies pain currently and no visible signs of pain upon arrival.     Patient is accompained by:  Family member Husband    Pertinent History  Traci [name] is a 84 y.o. female referred for imbalance and frequent falls. She is AOx1 at time of initial evaluation and husband reports that this is her baseline. She has difficulty with word finding. She has a history of CVA with R sided deficits initially but husband states no further weakness. Multiple lacunar infarcts found on MRI. Patient diagnosed with breast, abdominal cancer recently and has had several rounds of radiation. Her husband reports frequent falls, at least 14 in the last month. She would like to work on her balance to prevent falls. Multiple comorbid conditions.     Limitations  Walking    Diagnostic tests  MRI Stable noncontrast MRI appearance the brain since 2018. Scattered chronic small vessel ischemic sequelae which overall is mild for age    Patient Stated Goals  Decrease falls    Currently in Pain?  No/denies         [facility] PT Assessment - 06/19/17 1131      Standardized Balance Assessment   Standardized Balance Assessment  Berg Balance Test      Berg Balance Test   Sit to Stand  Able to stand  independently using hands    Standing Unsupported  Able to stand safely 2 minutes    Sitting with Back Unsupported but Feet Supported on Floor or Stool  Able to sit safely  and securely 2 minutes    Stand to Sit  Sits safely with minimal use of hands    Transfers  Able to transfer safely, definite need of hands    Standing Unsupported with Eyes Closed  Able to stand 10 seconds safely    Standing Ubsupported with Feet Together  Able to place feet together independently and stand 1 minute safely    From Standing, Reach Forward with Outstretched Arm  Can reach forward >12 cm safely (5")    From Standing Position, Pick up Object from Floor  Able to pick up shoe, needs supervision    From Standing Position, Turn to Look Behind Over each Shoulder  Needs assist to keep from losing balance and falling    Turn 360 Degrees  Needs assistance while turning    Standing Unsupported, Alternately Place Feet on Step/Stool  Needs assistance to keep from falling or unable to try    Standing Unsupported, One Foot in [employer] balance while stepping or standing    Standing on One Leg  Unable to try or needs assist to prevent fall    Total Score  32       TREATMENT  Ther-ex  NuStep L1 x 5 minutes for warm-up during history (3 minutes unbilled); Sit to stand 2 x 5; Quantum bilateral leg press 75# x 15, 90# x 15; Step-ups to 6" step with RUE support on railing and LUE handheld support x 5 each;  Neuromuscular Re-education  Performed BERG with patient who scored 32/56; Modified tandem balance alternating forward LE 30s hold x 3 bilateral; Toe taps to 2" Airex pad without UE support alternating LE x 10 each;;  Pt requires extensive verbal and tactile cues for exercises as well as demonstration so less exercises are able to be completed during session.                      PT Education - 06/19/17 1107    Education provided  Yes    Education Details  BERG results, exercise form/technique    Person(s) Educated  Patient    Methods  Explanation    Comprehension  Verbalized understanding       PT Short Term Goals - 06/12/17 1553      PT SHORT TERM GOAL #1    Title  Pt will be independent with HEP in order to improve strength and balance in order to decrease fall risk and improve function at home and work.     Time  8    Period  Weeks    Status  New    Target Date  08/06/17        PT Long Term Goals - 06/19/17 2106      PT LONG TERM GOAL #1   Title  Pt will decrease 5TSTS by at least 3 seconds in order to demonstrate clinically significant improvement in LE strength     Baseline  06/11/17: 23.8s    Time  8    Period  Weeks    Status  New    Target Date  08/06/17      PT LONG TERM GOAL #2   Title  Pt will increase 10MWT by at least 0.13 m/s in order to demonstrate clinically significant improvement in community ambulation.    Baseline  06/11/17: self selected 24.5=0.41 m/s, fastest: 18.0=0.56 m/s     Time  8    Period  Weeks    Status  New    Target Date  08/06/17      PT LONG TERM GOAL #3   Title  Pt will decrease TUG to below 14 seconds in order to demonstrate decreased fall risk     Baseline  06/11/17: 36.4s    Time  8    Period  Weeks    Status  New    Target Date  08/06/17      PT LONG TERM GOAL #4   Title  Pt will improve BERG by at least 3 points in order to demonstrate clinically significant improvement in balance.    Baseline  06/19/17: 32/56    Time  8  Status  New    Target Date  08/06/17            Plan - 06/19/17 1119    Clinical Impression Statement  Pt requires extensive verbal and tactile cues for exercises as well as demonstration so less exercises are able to be completed during session. She demonstrate min to mod fatigue with seated rest breaks during session. Pt and husband encouraged to continue HEP and follow-up as scheduled.     Rehab Potential  Fair    Clinical Impairments Affecting Rehab Potential  positive: motivation, support from husband; negative: cognitive impairments, age    PT Frequency  2x / week    PT Duration  8 weeks    PT Treatment/Interventions  Aquatic Therapy;Iontophoresis 4mg /ml  Dexamethasone;ADLs/Self Care Home Management;Canalith Repostioning;Cryotherapy;Electrical Stimulation;Moist Heat;Traction;Ultrasound;Gait training;DME Instruction;Functional mobility training;Therapeutic activities;Therapeutic exercise;Balance training;Neuromuscular re-education;Stair training;Patient/family education;Manual techniques;Energy conservation;Vestibular    PT Next Visit Plan  Review HEP, progress balance and strengthening    PT Home Exercise Plan  modified tandem balance with guarding by husband, sit to stand without UE support    Consulted and Agree with Plan of Care  Patient;Family member/caregiver    Family Member Consulted  Husband       Patient will benefit from skilled therapeutic intervention in order to improve the following deficits and impairments:  Abnormal gait, Decreased balance, Decreased strength, Decreased knowledge of use of DME, Difficulty walking  Visit Diagnosis: Unsteadiness on feet  Muscle weakness (generalized)     Problem List Patient Active Problem List   Diagnosis Date Noted  . Cancer, metastatic to bone ([place]) 06/21/2016  . Pain and swelling of left lower leg 08/25/2015  . Seizure disorder ([place]) 04/06/2015  . Personal history of urinary infection 03/15/2015  . Cerebrovascular accident (CVA) ([place]) 02/18/2015  . Endometrial cancer ([place]) 11/18/2014  . Degeneration of intervertebral disc of lumbosacral region 11/14/2014  . Overflow incontinence 11/14/2014  . Pure hypercholesterolemia 11/14/2014  . Acquired hypothyroidism 10/20/2014  . DDD (degenerative disc disease), lumbosacral 10/20/2014  . Benign neoplasm of colon 10/20/2014  . Acid reflux 10/20/2014  . Esophageal stenosis 10/20/2014  . Bloodgood disease 10/20/2014  . History of colon polyps 10/20/2014  . H/O gastric ulcer 10/20/2014  . Healed or old pulmonary embolism 10/20/2014  . History of urinary anomaly 10/20/2014  . BP (high blood pressure) 10/20/2014  . Hypersomnia with sleep  apnea 10/20/2014  . Multinodular goiter 10/20/2014  . Angina pectoris ([place]) 10/20/2014  . Seizure ([place]) 10/20/2014  . Digestive symptom 10/20/2014  . Incontinence overflow, urine 10/20/2014  . Dupuytren's contracture of foot 10/20/2014  . Hypercholesterolemia without hypertriglyceridemia 10/20/2014  . Cancer of left breast ([place]) 10/20/2014  . History of endometrial cancer 09/30/2014  . Lumbar radiculopathy 09/04/2014  . Deep vein thrombosis ([place]) 07/15/2013  . Deep vein thrombosis (DVT) ([place]) 07/15/2013  . Stroke [facility]) 04/07/2011   Phillips Grout PT, DPT   Huprich,[name] 06/19/2017, 9:10 PM  [place] [facility]IN [facility] SERVICES [address] [place], [place], [address] Phone: [phone]   Fax:  [phone]  Name: RAGAN DUHON MRN: [id number] Date of Birth: September 17, 1929

## 2017-06-19 NOTE — Telephone Encounter (Signed)
Patient needs her Fentanyl Patch 75 mcg refilled, and husband would like to pick it up today; he has appointment at [facility] around noon.     dhs

## 2017-06-19 NOTE — Telephone Encounter (Signed)
Spoke with pt's husband. Pt's husband made aware that script was to pt pharmacy and he does not need to pick up the script. He gave verbal understanding of this.

## 2017-06-19 NOTE — Telephone Encounter (Signed)
No need for husband to pick up script, MD can escribe.

## 2017-06-22 ENCOUNTER — Ambulatory Visit: Payer: Medicare Other

## 2017-06-22 VITALS — BP 120/63 | HR 75

## 2017-06-22 DIAGNOSIS — M6281 Muscle weakness (generalized): Secondary | ICD-10-CM

## 2017-06-22 DIAGNOSIS — R2681 Unsteadiness on feet: Secondary | ICD-10-CM | POA: Diagnosis not present

## 2017-06-22 NOTE — Therapy (Signed)
[place] MAIN [facility] SERVICES [address] [place], [place], [address] Phone: [phone]   Fax:  [phone]  Physical Therapy Treatment  Patient Details  Name: STEVI HOLLINSHEAD MRN: [id number] Date of Birth: Nov 04, 1929 Referring Provider: Jennings Books   Encounter Date: 06/22/2017  PT End of Session - 06/22/17 1138    Visit Number  3    Number of Visits  17    Date for PT Re-Evaluation  08/06/17    Authorization Type  progress note 3/10    PT Start Time  1046    PT Stop Time  1129    PT Time Calculation (min)  43 min    Equipment Utilized During Treatment  Gait belt    Activity Tolerance  Patient tolerated treatment well    Behavior During Therapy  [facility] for tasks assessed/performed;Flat affect       Past Medical History:  Diagnosis Date  . Arthritis    Degenerative Disc Disease  . Bone cancer ([place])   . Breast cancer ([place]) 2016   LT LUMPECTOMY 03-2015  . Chronic kidney disease    HYDRONEPHROSIS  . Deep vein blood clot of left lower extremity ([place]) February 11, 2015   Left Leg  . Endometrial adenocarcinoma ([place]) 01/2012    stage Ib, grade 1, positive peritoneal cytology, no other risk factors or extra-uterine disease  . Gastric ulcer   . GERD (gastroesophageal reflux disease)   . Hearing loss   . Hiccups   . History of brachytherapy   . History of colon polyps   . History of DVT (deep vein thrombosis)   . History of esophageal stricture   . History of shingles   . Inguinal lymphocyst   . Mild aphasia   . Pain    SEVERE CHRONIC RIGHT LEG,HIP,FOOT  . PE (pulmonary embolism)    H/O  . Stroke [facility]) 04/06/2011   residual:  aphasia    Past Surgical History:  Procedure Laterality Date  . ABDOMINAL HYSTERECTOMY  December 2013   history of cervical cancer  . BACK SURGERY     fusion of L3&4  . BREAST BIOPSY Left 10/13/2014   POS  . BREAST LUMPECTOMY Left 03/2015  . CATARACT EXTRACTION W/ INTRAOCULAR LENS IMPLANT     right  .  CYSTOSCOPY W/ RETROGRADES Right 10/28/2014   Procedure: CYSTOSCOPY WITH RETROGRADE PYELOGRAM ATTEMPT, UNABLE TO PASS ;  Surgeon: [name], MD;  Location: [facility];  Service: Urology;  Laterality: Right;  . CYSTOSCOPY W/ RETROGRADES Right 09/15/2015   Procedure: CYSTOSCOPY WITH RETROGRADE PYELOGRAM;  Surgeon: [name], MD;  Location: [facility];  Service: Urology;  Laterality: Right;  . CYSTOSCOPY W/ URETERAL STENT PLACEMENT Right 03/10/2015   Procedure: CYSTOSCOPY WITH STENT REPLACEMENT;  Surgeon: [name], MD;  Location: [facility];  Service: Urology;  Laterality: Right;  . CYSTOSCOPY W/ URETERAL STENT PLACEMENT Right 06/09/2015   Procedure: CYSTOSCOPY WITH STENT REPLACEMENT;  Surgeon: [name], MD;  Location: [facility];  Service: Urology;  Laterality: Right;  . CYSTOSCOPY W/ URETERAL STENT PLACEMENT Right 09/15/2015   Procedure: CYSTOSCOPY WITH STENT REPLACEMENT;  Surgeon: [name], MD;  Location: [facility];  Service: Urology;  Laterality: Right;  . CYSTOSCOPY WITH STENT PLACEMENT Right 10/28/2014   Procedure: BLADDER BIOPSY WITH FULGERATION ;  Surgeon: [name], MD;  Location: [facility];  Service: Urology;  Laterality: Right;  . ESOPHAGEAL DILATION    . EYE SURGERY Bilateral  Cataract Extraction  . PARTIAL MASTECTOMY WITH NEEDLE LOCALIZATION Left 03/30/2015   Procedure: PARTIAL MASTECTOMY WITH NEEDLE LOCALIZATION;  Surgeon: [name], MD;  Location: [facility];  Service: General;  Laterality: Left;  . PERIPHERAL VASCULAR CATHETERIZATION N/A 02/11/2015   Procedure: IVC Filter Insertion;  Surgeon: Algernon Huxley, MD;  Location: [place] [facility];  Service: Cardiovascular;  Laterality: N/A;  . PORTACATH PLACEMENT Right 11/26/2014   Procedure: INSERTION PORT-A-CATH;  Surgeon: [name], MD;  Location: [facility];  Service: General;  Laterality: Right;  . SENTINEL NODE BIOPSY Left 03/30/2015   Procedure: SENTINEL NODE BIOPSY;  Surgeon:  [name], MD;  Location: [facility];  Service: General;  Laterality: Left;  . thyroid removed    . THYROIDECTOMY, PARTIAL    . TONSILLECTOMY    . TOTAL ABDOMINAL HYSTERECTOMY W/ BILATERAL SALPINGOOPHORECTOMY Bilateral    staging biopsies    Vitals:   06/22/17 1049  BP: 120/63  Pulse: 75  SpO2: 98%    Subjective Assessment - 06/22/17 1049    Subjective  Husband denies any falls since the last visit. No health changes. Per husband pt had one of her best days in a long time yesterday and worked out in her garden. However he states that this morning has been "one of her worst days" with respect to her memory and balance. Pt denies pain currently and no visible signs of pain upon arrival. No specific questions or concerns from pt or caregiver.    Patient is accompained by:  Family member Husband    Pertinent History  Ms. Lizama is a 84 y.o. female referred for imbalance and frequent falls. She is AOx1 at time of initial evaluation and husband reports that this is her baseline. She has difficulty with word finding. She has a history of CVA with R sided deficits initially but husband states no further weakness. Multiple lacunar infarcts found on MRI. Patient diagnosed with breast, abdominal cancer recently and has had several rounds of radiation. Her husband reports frequent falls, at least 14 in the last month. She would like to work on her balance to prevent falls. Multiple comorbid conditions.     Limitations  Walking    Diagnostic tests  MRI Stable noncontrast MRI appearance the brain since 2018. Scattered chronic small vessel ischemic sequelae which overall is mild for age    Patient Stated Goals  Decrease falls    Currently in Pain?  No/denies          TREATMENT  Ther-ex  NuStep L1 x 5 minutes for warm-up seat position 9, hand position 10 (unbilled); Quantum bilateral leg press 90# 2 x 15; Sit to stand 2 x 10;  Step-ups to 6" step from Airex pad with RUE support on railing  and LUE handheld support x 5 each; Seated marches 2 x 10; Seated adductor squeeze with manual resistance 2 x 10; Seated clams with manual resistance 2 x 10;  Neuromuscular Re-education  Toe taps to 2" Airex pad without UE support alternating LE x 10 each; Modified tandem balance alternating forward LE 30s hold bilateral; Side stepping in // bars with bilateral hand held assist from therapist who varies assistance throughout x 2 lengths;  Pt requires extensive verbal and tactile cues for exercises as well as demonstration so less exercises are able to be completed during session.                       PT Education -  06/22/17 1138    Education provided  Yes    Education Details  exercise form/technique    Person(s) Educated  Patient    Methods  Explanation    Comprehension  Verbalized understanding       PT Short Term Goals - 06/12/17 1553      PT SHORT TERM GOAL #1   Title  Pt will be independent with HEP in order to improve strength and balance in order to decrease fall risk and improve function at home and work.     Time  8    Period  Weeks    Status  New    Target Date  08/06/17        PT Long Term Goals - 06/19/17 2106      PT LONG TERM GOAL #1   Title  Pt will decrease 5TSTS by at least 3 seconds in order to demonstrate clinically significant improvement in LE strength     Baseline  06/11/17: 23.8s    Time  8    Period  Weeks    Status  New    Target Date  08/06/17      PT LONG TERM GOAL #2   Title  Pt will increase 10MWT by at least 0.13 m/s in order to demonstrate clinically significant improvement in community ambulation.    Baseline  06/11/17: self selected 24.5=0.41 m/s, fastest: 18.0=0.56 m/s     Time  8    Period  Weeks    Status  New    Target Date  08/06/17      PT LONG TERM GOAL #3   Title  Pt will decrease TUG to below 14 seconds in order to demonstrate decreased fall risk     Baseline  06/11/17: 36.4s    Time  8    Period  Weeks     Status  New    Target Date  08/06/17      PT LONG TERM GOAL #4   Title  Pt will improve BERG by at least 3 points in order to demonstrate clinically significant improvement in balance.    Baseline  06/19/17: 32/56    Time  8    Status  New    Target Date  08/06/17            Plan - 06/22/17 1138    Clinical Impression Statement  Pt continues to require extensive verbal and tactile cues for exercises as well as demonstration so less exercises are able to be completed today than during a typical session. However she does demonstrate some recall of last session so is able to complete today. She demonstrates min to mod fatigue with seated rest breaks during session. Pt and husband encouraged to continue HEP and follow-up as scheduled    Rehab Potential  Fair    Clinical Impairments Affecting Rehab Potential  positive: motivation, support from husband; negative: cognitive impairments, age    PT Frequency  2x / week    PT Duration  8 weeks    PT Treatment/Interventions  Aquatic Therapy;Iontophoresis 4mg /ml Dexamethasone;ADLs/Self Care Home Management;Canalith Repostioning;Cryotherapy;Electrical Stimulation;Moist Heat;Traction;Ultrasound;Gait training;DME Instruction;Functional mobility training;Therapeutic activities;Therapeutic exercise;Balance training;Neuromuscular re-education;Stair training;Patient/family education;Manual techniques;Energy conservation;Vestibular    PT Next Visit Plan  progress balance and strengthening    PT Home Exercise Plan  modified tandem balance with guarding by husband, sit to stand without UE support    Consulted and Agree with Plan of Care  Patient;Family member/caregiver    Family Member Consulted  Husband       Patient will benefit from skilled therapeutic intervention in order to improve the following deficits and impairments:  Abnormal gait, Decreased balance, Decreased strength, Decreased knowledge of use of DME, Difficulty walking  Visit  Diagnosis: Unsteadiness on feet  Muscle weakness (generalized)     Problem List Patient Active Problem List   Diagnosis Date Noted  . Cancer, metastatic to bone ([place]) 06/21/2016  . Pain and swelling of left lower leg 08/25/2015  . Seizure disorder ([place]) 04/06/2015  . Personal history of urinary infection 03/15/2015  . Cerebrovascular accident (CVA) ([place]) 02/18/2015  . Endometrial cancer ([place]) 11/18/2014  . Degeneration of intervertebral disc of lumbosacral region 11/14/2014  . Overflow incontinence 11/14/2014  . Pure hypercholesterolemia 11/14/2014  . Acquired hypothyroidism 10/20/2014  . DDD (degenerative disc disease), lumbosacral 10/20/2014  . Benign neoplasm of colon 10/20/2014  . Acid reflux 10/20/2014  . Esophageal stenosis 10/20/2014  . Bloodgood disease 10/20/2014  . History of colon polyps 10/20/2014  . H/O gastric ulcer 10/20/2014  . Healed or old pulmonary embolism 10/20/2014  . History of urinary anomaly 10/20/2014  . BP (high blood pressure) 10/20/2014  . Hypersomnia with sleep apnea 10/20/2014  . Multinodular goiter 10/20/2014  . Angina pectoris ([place]) 10/20/2014  . Seizure ([place]) 10/20/2014  . Digestive symptom 10/20/2014  . Incontinence overflow, urine 10/20/2014  . Dupuytren's contracture of foot 10/20/2014  . Hypercholesterolemia without hypertriglyceridemia 10/20/2014  . Cancer of left breast ([place]) 10/20/2014  . History of endometrial cancer 09/30/2014  . Lumbar radiculopathy 09/04/2014  . Deep vein thrombosis ([place]) 07/15/2013  . Deep vein thrombosis (DVT) ([place]) 07/15/2013  . Stroke [facility]) 04/07/2011   Phillips Grout PT, DPT   Tramane Gorum 06/22/2017, 12:02 PM  [name] MAIN [facility] SERVICES [address] [place], [place], [address] Phone: [phone]   Fax:  [phone]  Name: KYNZLEIGH BANDEL MRN: [id number] Date of Birth: 10/09/1929

## 2017-06-25 ENCOUNTER — Other Ambulatory Visit: Payer: Self-pay | Admitting: *Deleted

## 2017-06-25 DIAGNOSIS — C7951 Secondary malignant neoplasm of bone: Secondary | ICD-10-CM

## 2017-06-25 DIAGNOSIS — C541 Malignant neoplasm of endometrium: Secondary | ICD-10-CM

## 2017-06-25 MED ORDER — OXYCODONE-ACETAMINOPHEN 10-325 MG PO TABS: 1.0000 | ORAL_TABLET | Freq: Three times a day (TID) | ORAL | 0 refills | Status: DC | PRN

## 2017-06-26 ENCOUNTER — Ambulatory Visit: Payer: Medicare Other

## 2017-06-27 ENCOUNTER — Ambulatory Visit: Payer: Medicare Other | Admitting: Internal Medicine

## 2017-06-27 ENCOUNTER — Ambulatory Visit: Payer: Medicare Other

## 2017-06-28 ENCOUNTER — Ambulatory Visit: Payer: Medicare Other

## 2017-06-28 VITALS — BP 128/59 | HR 59

## 2017-06-28 DIAGNOSIS — R2681 Unsteadiness on feet: Secondary | ICD-10-CM

## 2017-06-28 DIAGNOSIS — M6281 Muscle weakness (generalized): Secondary | ICD-10-CM

## 2017-06-28 NOTE — Therapy (Signed)
Traci [name] [address] [place], [place], [address] Phone: [phone]   Fax:  [phone]  Physical Therapy Treatment  Patient Details  Name: Traci [name] MRN: [id number] Date of Birth: [birth year] Referring Provider: Jennings [name]   Encounter Date: 06/28/2017  PT End of Session - 06/28/17 1057    Visit Number  4    Number of Visits  17    Date for PT Re-Evaluation  08/06/17    Authorization Type  progress note 4/10    PT Start Time  1052    PT Stop Time  1135    PT Time Calculation (min)  43 min    Equipment Utilized During Treatment  Gait belt    Activity Tolerance  Patient tolerated treatment well    Behavior During Therapy  [facility] for tasks assessed/performed;Flat affect       Past Medical History:  Diagnosis Date  . Arthritis    Degenerative Disc Disease  . Bone cancer ([place])   . Breast cancer ([place]) 2016   LT LUMPECTOMY 03-2015  . Chronic kidney disease    HYDRONEPHROSIS  . Deep vein blood clot of left lower extremity ([place]) February 11, 2015   Left Leg  . Endometrial adenocarcinoma ([place]) 01/2012    stage Ib, grade 1, positive peritoneal cytology, no other risk factors or extra-uterine disease  . Gastric ulcer   . GERD (gastroesophageal reflux disease)   . Hearing loss   . Hiccups   . History of brachytherapy   . History of colon polyps   . History of DVT (deep vein thrombosis)   . History of esophageal stricture   . History of shingles   . Inguinal lymphocyst   . Mild aphasia   . Pain    SEVERE CHRONIC RIGHT LEG,HIP,FOOT  . PE (pulmonary embolism)    H/O  . Stroke [facility]) 04/06/2011   residual:  aphasia    Past Surgical History:  Procedure Laterality Date  . ABDOMINAL HYSTERECTOMY  December 2013   history of cervical cancer  . BACK SURGERY     fusion of L3&4  . BREAST BIOPSY Left 10/13/2014   POS  . BREAST LUMPECTOMY Left 03/2015  . CATARACT EXTRACTION W/ INTRAOCULAR LENS IMPLANT     right  .  CYSTOSCOPY W/ RETROGRADES Right 10/28/2014   Procedure: CYSTOSCOPY WITH RETROGRADE PYELOGRAM ATTEMPT, UNABLE TO PASS ;  Surgeon: [name], MD;  Location: [facility];  Service: Urology;  Laterality: Right;  . CYSTOSCOPY W/ RETROGRADES Right 09/15/2015   Procedure: CYSTOSCOPY WITH RETROGRADE PYELOGRAM;  Surgeon: [name], MD;  Location: [facility];  Service: Urology;  Laterality: Right;  . CYSTOSCOPY W/ URETERAL STENT PLACEMENT Right 03/10/2015   Procedure: CYSTOSCOPY WITH STENT REPLACEMENT;  Surgeon: [name], MD;  Location: [facility];  Service: Urology;  Laterality: Right;  . CYSTOSCOPY W/ URETERAL STENT PLACEMENT Right 06/09/2015   Procedure: CYSTOSCOPY WITH STENT REPLACEMENT;  Surgeon: [name], MD;  Location: [facility];  Service: Urology;  Laterality: Right;  . CYSTOSCOPY W/ URETERAL STENT PLACEMENT Right 09/15/2015   Procedure: CYSTOSCOPY WITH STENT REPLACEMENT;  Surgeon: [name], MD;  Location: [facility];  Service: Urology;  Laterality: Right;  . CYSTOSCOPY WITH STENT PLACEMENT Right 10/28/2014   Procedure: BLADDER BIOPSY WITH FULGERATION ;  Surgeon: [name], MD;  Location: [facility];  Service: Urology;  Laterality: Right;  . ESOPHAGEAL DILATION    . EYE SURGERY Bilateral  Cataract Extraction  . PARTIAL MASTECTOMY WITH NEEDLE LOCALIZATION Left 03/30/2015   Procedure: PARTIAL MASTECTOMY WITH NEEDLE LOCALIZATION;  Surgeon: [name], MD;  Location: [facility];  Service: General;  Laterality: Left;  . PERIPHERAL VASCULAR CATHETERIZATION N/A 02/11/2015   Procedure: IVC Filter Insertion;  Surgeon: Algernon Huxley, MD;  Location: [place] [facility];  Service: Cardiovascular;  Laterality: N/A;  . PORTACATH PLACEMENT Right 11/26/2014   Procedure: INSERTION PORT-A-CATH;  Surgeon: [name], MD;  Location: [facility];  Service: General;  Laterality: Right;  . SENTINEL NODE BIOPSY Left 03/30/2015   Procedure: SENTINEL NODE BIOPSY;  Surgeon:  [name], MD;  Location: [facility];  Service: General;  Laterality: Left;  . thyroid removed    . THYROIDECTOMY, PARTIAL    . TONSILLECTOMY    . TOTAL ABDOMINAL HYSTERECTOMY W/ BILATERAL SALPINGOOPHORECTOMY Bilateral    staging biopsies    Vitals:   06/28/17 1054  BP: (!) 128/59  Pulse: (!) 59  SpO2: 100%    Subjective Assessment - 06/28/17 1054    Subjective  Husband denies any falls since the last visit. No health changes other than multiple "bad days" with respect to her memory. She is complaining of R low back/hip pain today at the site of her cancer which is chronic. She is unable to rate her pain on a 0-10 scale. No visible signs of pain upon arrival. No specific questions or concerns from pt or caregiver.    Patient is accompained by:  Family member Husband    Pertinent History  [name] is a 84 y.o. female referred for imbalance and frequent falls. She is AOx1 at time of initial evaluation and husband reports that this is her baseline. She has difficulty with word finding. She has a history of CVA with R sided deficits initially but husband states no further weakness. Multiple lacunar infarcts found on MRI. Patient diagnosed with breast, abdominal cancer recently and has had several rounds of radiation. Her husband reports frequent falls, at least 14 in the last month. She would like to work on her balance to prevent falls. Multiple comorbid conditions.     Limitations  Walking    Diagnostic tests  MRI Stable noncontrast MRI appearance the brain since 2018. Scattered chronic small vessel ischemic sequelae which overall is mild for age    Patient Stated Goals  Decrease falls           TREATMENT  Ther-ex NuStep L1 x 5 minutes for warm-up seat position 8, hand position 9, speed around 30 spm (unbilled); Quantum bilateral leg press 90# 20, 105# x 15 ; Sit to stand 2 x 10;  Seated marches 2 x 15; Seated adductor squeeze with manual resistance 2 x 15; Seated clams  with manual resistance 2 x 15; Step-ups to 6" step from Airex pad with RUE support on railing and LUE handheld support x 5 each;  Neuromuscular Re-education Feet apart balance with eyes closed 30s x multiple bouts; Feet together balance with eyes closed 30s x multiple bouts; Toe taps to 4" step without UE supportalternating LE x 10 each;  Pt requires extensive verbal and tactile cues for exercises as well as demonstration so less exercises are able to be completed during session.                       PT Education - 06/28/17 1057    Education provided  Yes    Education Details  exercise  form/technique    Person(s) Educated  Patient    Methods  Explanation       PT Short Term Goals - 06/12/17 1553      PT SHORT TERM GOAL #1   Title  Pt will be independent with HEP in order to improve strength and balance in order to decrease fall risk and improve function at home and work.     Time  8    Period  Weeks    Status  New    Target Date  08/06/17        PT Long Term Goals - 06/19/17 2106      PT LONG TERM GOAL #1   Title  Pt will decrease 5TSTS by at least 3 seconds in order to demonstrate clinically significant improvement in LE strength     Baseline  06/11/17: 23.8s    Time  8    Period  Weeks    Status  New    Target Date  08/06/17      PT LONG TERM GOAL #2   Title  Pt will increase 10MWT by at least 0.13 m/s in order to demonstrate clinically significant improvement in community ambulation.    Baseline  06/11/17: self selected 24.5=0.41 m/s, fastest: 18.0=0.56 m/s     Time  8    Period  Weeks    Status  New    Target Date  08/06/17      PT LONG TERM GOAL #3   Title  Pt will decrease TUG to below 14 seconds in order to demonstrate decreased fall risk     Baseline  06/11/17: 36.4s    Time  8    Period  Weeks    Status  New    Target Date  08/06/17      PT LONG TERM GOAL #4   Title  Pt will improve BERG by at least 3 points in order to demonstrate  clinically significant improvement in balance.    Baseline  06/19/17: 32/56    Time  8    Status  New    Target Date  08/06/17            Plan - 06/28/17 1057    Clinical Impression Statement  Pt continues to require extensive verbal and tactile cues for exercises as well as demonstration so less exercises are able to be completed today than during a typical session. Her cognition is worse today than during prior sessions and overall she has less energy. She demonstrates min to mod fatigue with seated rest breaks during session. Pt and husband encouraged to continue HEP and follow-up as scheduled.    Rehab Potential  Fair    Clinical Impairments Affecting Rehab Potential  positive: motivation, support from husband; negative: cognitive impairments, age    PT Frequency  2x / week    PT Duration  8 weeks    PT Treatment/Interventions  Aquatic Therapy;Iontophoresis 4mg /ml Dexamethasone;ADLs/Self Care Home Management;Canalith Repostioning;Cryotherapy;Electrical Stimulation;Moist Heat;Traction;Ultrasound;Gait training;DME Instruction;Functional mobility training;Therapeutic activities;Therapeutic exercise;Balance training;Neuromuscular re-education;Stair training;Patient/family education;Manual techniques;Energy conservation;Vestibular    PT Next Visit Plan  progress balance and strengthening    PT Home Exercise Plan  modified tandem balance with guarding by husband, sit to stand without UE support    Consulted and Agree with Plan of Care  Patient;Family member/caregiver    Family Member Consulted  Husband       Patient will benefit from skilled therapeutic intervention in order to improve the following deficits and  impairments:  Abnormal gait, Decreased balance, Decreased strength, Decreased knowledge of use of DME, Difficulty walking  Visit Diagnosis: Unsteadiness on feet  Muscle weakness (generalized)     Problem List Patient Active Problem List   Diagnosis Date Noted  . Cancer,  metastatic to bone ([place]) 06/21/2016  . Pain and swelling of left lower leg 08/25/2015  . Seizure disorder ([place]) 04/06/2015  . Personal history of urinary infection 03/15/2015  . Cerebrovascular accident (CVA) ([place]) 02/18/2015  . Endometrial cancer ([place]) 11/18/2014  . Degeneration of intervertebral disc of lumbosacral region 11/14/2014  . Overflow incontinence 11/14/2014  . Pure hypercholesterolemia 11/14/2014  . Acquired hypothyroidism 10/20/2014  . DDD (degenerative disc disease), lumbosacral 10/20/2014  . Benign neoplasm of colon 10/20/2014  . Acid reflux 10/20/2014  . Esophageal stenosis 10/20/2014  . Bloodgood disease 10/20/2014  . History of colon polyps 10/20/2014  . H/O gastric ulcer 10/20/2014  . Healed or old pulmonary embolism 10/20/2014  . History of urinary anomaly 10/20/2014  . BP (high blood pressure) 10/20/2014  . Hypersomnia with sleep apnea 10/20/2014  . Multinodular goiter 10/20/2014  . Angina pectoris ([place]) 10/20/2014  . Seizure ([place]) 10/20/2014  . Digestive symptom 10/20/2014  . Incontinence overflow, urine 10/20/2014  . Dupuytren's contracture of foot 10/20/2014  . Hypercholesterolemia without hypertriglyceridemia 10/20/2014  . Cancer of left breast ([place]) 10/20/2014  . History of endometrial cancer 09/30/2014  . Lumbar radiculopathy 09/04/2014  . Deep vein thrombosis ([place]) 07/15/2013  . Deep vein thrombosis (DVT) ([place]) 07/15/2013  . Stroke [facility]) 04/07/2011   Phillips Grout PT, DPT   [place],[name] 06/29/2017, 4:21 PM  [place] MAIN [name] [address] [place], [place], [address] Phone: [phone]   Fax:  [phone]  Name: Traci [name] MRN: [id number] Date of Birth: 1930-01-29

## 2017-07-02 ENCOUNTER — Ambulatory Visit: Payer: Medicare Other

## 2017-07-03 ENCOUNTER — Ambulatory Visit: Payer: Medicare Other | Admitting: Physical Therapy

## 2017-07-04 ENCOUNTER — Inpatient Hospital Stay (HOSPITAL_BASED_OUTPATIENT_CLINIC_OR_DEPARTMENT_OTHER): Payer: Medicare Other | Admitting: Internal Medicine

## 2017-07-04 ENCOUNTER — Other Ambulatory Visit: Payer: Self-pay

## 2017-07-04 ENCOUNTER — Inpatient Hospital Stay: Payer: Medicare Other | Attending: Obstetrics and Gynecology | Admitting: Obstetrics and Gynecology

## 2017-07-04 ENCOUNTER — Encounter: Payer: Self-pay | Admitting: Internal Medicine

## 2017-07-04 VITALS — BP 112/73 | HR 66 | Temp 97.9°F | Ht 60.0 in | Wt 116.0 lb

## 2017-07-04 VITALS — BP 112/73 | HR 66 | Temp 97.9°F | Resp 18 | Ht 60.0 in | Wt 116.0 lb

## 2017-07-04 DIAGNOSIS — C7951 Secondary malignant neoplasm of bone: Secondary | ICD-10-CM | POA: Insufficient documentation

## 2017-07-04 DIAGNOSIS — R296 Repeated falls: Secondary | ICD-10-CM | POA: Insufficient documentation

## 2017-07-04 DIAGNOSIS — R911 Solitary pulmonary nodule: Secondary | ICD-10-CM | POA: Diagnosis not present

## 2017-07-04 DIAGNOSIS — Z90710 Acquired absence of both cervix and uterus: Secondary | ICD-10-CM | POA: Insufficient documentation

## 2017-07-04 DIAGNOSIS — Z90722 Acquired absence of ovaries, bilateral: Secondary | ICD-10-CM | POA: Diagnosis not present

## 2017-07-04 DIAGNOSIS — R634 Abnormal weight loss: Secondary | ICD-10-CM | POA: Insufficient documentation

## 2017-07-04 DIAGNOSIS — I82402 Acute embolism and thrombosis of unspecified deep veins of left lower extremity: Secondary | ICD-10-CM

## 2017-07-04 DIAGNOSIS — M25551 Pain in right hip: Secondary | ICD-10-CM | POA: Diagnosis not present

## 2017-07-04 DIAGNOSIS — Z7901 Long term (current) use of anticoagulants: Secondary | ICD-10-CM | POA: Diagnosis not present

## 2017-07-04 DIAGNOSIS — R5381 Other malaise: Secondary | ICD-10-CM | POA: Insufficient documentation

## 2017-07-04 DIAGNOSIS — M7989 Other specified soft tissue disorders: Secondary | ICD-10-CM

## 2017-07-04 DIAGNOSIS — Z79811 Long term (current) use of aromatase inhibitors: Secondary | ICD-10-CM | POA: Insufficient documentation

## 2017-07-04 DIAGNOSIS — R5383 Other fatigue: Secondary | ICD-10-CM | POA: Insufficient documentation

## 2017-07-04 DIAGNOSIS — C541 Malignant neoplasm of endometrium: Secondary | ICD-10-CM | POA: Diagnosis not present

## 2017-07-04 NOTE — Progress Notes (Signed)
[facility] OFFICE PROGRESS NOTE  Patient Care Team: Idelle Crouch, MD as PCP - General (Unknown Physician Specialty) [name], MD as Referring Physician (Surgery) [name], MD as Consulting Physician (Urology)  Cancer Staging Cancer of left breast [facility]) Staging form: Breast, AJCC 7th Edition - Clinical: Stage IA (T1b, N0, M0) - Signed by Forest Gleason, MD on 10/20/2014    Oncology History   . Patient has a previous history of endometrium carcinoma of endometrium stage IB disease status post bilateral salpingo-oophorectomy and radiation therapy.  4. Right hydronephrosis detected on MRI scan off lumbosacral spine. Cystoscopy with attempted stent placement revealed bladder tumor in September of 2016 biopsies positive for recurrent endometrial cancer "ENDOMETROID"  5. Patient had stent placement in the right kidney Started radiation therapy 6. Patient has finished radiation therapy with relief in the pain (November, 2016) 7. Started on Megace in December of 2016 8 deep vein thrombosis in the left lower extremity (February 08, 2014) Hematuria due to xeralto IVC filter placement on February 11, 2015 She is off xeralto 9.condition recently had a cystoscopy no evidence of bleeding was found so patient has been started on ELOQUIS (March 15, 2015)  10. Patient again had a significant bleeding with hematuria with eloquis and has been discontinued; Patient is on Plavix. ON  Megace. AUG 2017- PET IMPROVED; mediastinal uptake- monitor for now.   FEB 2017-LEFT BREAST CA [9'0 clock- overlapping site] Estrogen receptor positive. Progesterone receptor positive. HER-2 receptor; Her 2neg; 5 mm tumor clinically stage IB N0 M0 tumor;s/p Lumpectomy; NO RT  # PET NOV 27th- STABLE right iliac lesions [increasing pain]/ right paratrac- refer to RT  # NOV 7th 2018- ? Adrenal insuff; STARTED MIDODRINE  # X-geva q 4 w; Jan 2019- every 8 w  # # August 25 2015- LLE DVT- start Eliquis [stop plavix]; Jan 2019- Eliquis 2.5 mg BID  # MOLECULAR STUDIES: 08/23/15- MMR-STABLE.      Cancer of left breast ([place])   10/20/2014 Initial Diagnosis    Cancer of left breast       Endometrial cancer ([place])      INTERVAL HISTORY: Patient a poor historian/accompanied by husband.  Traci [name] 84 y.o.  female pleasant patient above history of metastatic endometrial cancer is here for follow-up.  As per the husband patient continues to have memory problems; however she complains of worsening pain in the right hip going down her leg.  Denies any tingling or numbness.  She states the pain is getting worse; especially with movement.  Denies any falls.  As per the husband slight worsening of the leg swelling.  Review of Systems  Constitutional: Positive for malaise/fatigue and weight loss. Negative for chills, diaphoresis and fever.  HENT: Negative for nosebleeds and sore throat.   Eyes: Negative for double vision.  Respiratory: Negative for cough, hemoptysis, sputum production, shortness of breath and wheezing.   Cardiovascular: Positive for leg swelling. Negative for chest pain, palpitations and orthopnea.  Gastrointestinal: Negative for abdominal pain, blood in stool, constipation, diarrhea, heartburn, melena, nausea and vomiting.  Genitourinary: Negative for dysuria, frequency and urgency.  Musculoskeletal: Negative for back pain and joint pain.  Skin: Negative.  Negative for itching and rash.  Neurological: Negative for dizziness, tingling, focal weakness, weakness and headaches.  Endo/Heme/Allergies: Does not bruise/bleed easily.  Psychiatric/Behavioral: Positive for memory loss. Negative for depression. The patient is not nervous/anxious and does not have insomnia.       PAST MEDICAL  HISTORY :  Past Medical History:  Diagnosis Date  . Arthritis    Degenerative Disc Disease  . Bone cancer ([facility])   . Breast cancer ([place]) 2016   LT LUMPECTOMY  03-2015  . Chronic kidney disease    HYDRONEPHROSIS  . Deep vein blood clot of left lower extremity ([place]) February 11, 2015   Left Leg  . Endometrial adenocarcinoma ([place]) 01/2012    stage Ib, grade 1, positive peritoneal cytology, no other risk factors or extra-uterine disease  . Gastric ulcer   . GERD (gastroesophageal reflux disease)   . Hearing loss   . Hiccups   . History of brachytherapy   . History of colon polyps   . History of DVT (deep vein thrombosis)   . History of esophageal stricture   . History of shingles   . Inguinal lymphocyst   . Mild aphasia   . Pain    SEVERE CHRONIC RIGHT LEG,HIP,FOOT  . PE (pulmonary embolism)    H/O  . Stroke ([place]) 04/06/2011   residual:  aphasia    PAST SURGICAL HISTORY :   Past Surgical History:  Procedure Laterality Date  . ABDOMINAL HYSTERECTOMY  December 2013   history of cervical cancer  . BACK SURGERY     fusion of L3&4  . BREAST BIOPSY Left 10/13/2014   POS  . BREAST LUMPECTOMY Left 03/2015  . CATARACT EXTRACTION W/ INTRAOCULAR LENS IMPLANT     right  . CYSTOSCOPY W/ RETROGRADES Right 10/28/2014   Procedure: CYSTOSCOPY WITH RETROGRADE PYELOGRAM ATTEMPT, UNABLE TO PASS ;  Surgeon: [name], MD;  Location: [facility];  Service: Urology;  Laterality: Right;  . CYSTOSCOPY W/ RETROGRADES Right 09/15/2015   Procedure: CYSTOSCOPY WITH RETROGRADE PYELOGRAM;  Surgeon: [name], MD;  Location: [facility];  Service: Urology;  Laterality: Right;  . CYSTOSCOPY W/ URETERAL STENT PLACEMENT Right 03/10/2015   Procedure: CYSTOSCOPY WITH STENT REPLACEMENT;  Surgeon: [name], MD;  Location: [facility];  Service: Urology;  Laterality: Right;  . CYSTOSCOPY W/ URETERAL STENT PLACEMENT Right 06/09/2015   Procedure: CYSTOSCOPY WITH STENT REPLACEMENT;  Surgeon: [name], MD;  Location: [facility];  Service: Urology;  Laterality: Right;  . CYSTOSCOPY W/ URETERAL STENT PLACEMENT Right 09/15/2015   Procedure: CYSTOSCOPY WITH STENT  REPLACEMENT;  Surgeon: [name], MD;  Location: [facility];  Service: Urology;  Laterality: Right;  . CYSTOSCOPY WITH STENT PLACEMENT Right 10/28/2014   Procedure: BLADDER BIOPSY WITH FULGERATION ;  Surgeon: [name], MD;  Location: [facility];  Service: Urology;  Laterality: Right;  . ESOPHAGEAL DILATION    . EYE SURGERY Bilateral    Cataract Extraction  . PARTIAL MASTECTOMY WITH NEEDLE LOCALIZATION Left 03/30/2015   Procedure: PARTIAL MASTECTOMY WITH NEEDLE LOCALIZATION;  Surgeon: [name], MD;  Location: [facility];  Service: General;  Laterality: Left;  . PERIPHERAL VASCULAR CATHETERIZATION N/A 02/11/2015   Procedure: IVC Filter Insertion;  Surgeon: Algernon Huxley, MD;  Location: [facility];  Service: Cardiovascular;  Laterality: N/A;  . PORTACATH PLACEMENT Right 11/26/2014   Procedure: INSERTION PORT-A-CATH;  Surgeon: [name], MD;  Location: [facility];  Service: General;  Laterality: Right;  . SENTINEL NODE BIOPSY Left 03/30/2015   Procedure: SENTINEL NODE BIOPSY;  Surgeon: [name], MD;  Location: [facility];  Service: General;  Laterality: Left;  . thyroid removed    . THYROIDECTOMY, PARTIAL    . TONSILLECTOMY    . TOTAL ABDOMINAL  HYSTERECTOMY W/ BILATERAL SALPINGOOPHORECTOMY Bilateral    staging biopsies    FAMILY HISTORY :   Family History  Problem Relation Age of Onset  . Heart attack Mother   . Breast cancer Maternal Aunt        x 2    SOCIAL HISTORY:   Social History   Tobacco Use  . Smoking status: Never Smoker  . Smokeless tobacco: Never Used  Substance Use Topics  . Alcohol use: No    Alcohol/week: 0.0 oz  . Drug use: No    ALLERGIES:  is allergic to penicillins; acetaminophen; hydrocodone-acetaminophen; novocain [procaine]; sulfa antibiotics; and valium [diazepam].  MEDICATIONS:  Current Outpatient Medications  Medication Sig Dispense Refill  . apixaban (ELIQUIS) 2.5 MG TABS tablet Take 1 tablet (2.5 mg  total) by mouth 2 (two) times daily. 60 tablet 4  . donepezil (ARICEPT) 10 MG tablet Take 0.5 tablets by mouth 2 (two) times daily.     . fentaNYL (DURAGESIC - DOSED MCG/HR) 75 MCG/HR Place 1 patch (75 mcg total) onto the skin every 3 (three) days. 10 patch 0  . gabapentin (NEURONTIN) 300 MG capsule Take 1 capsule (300 mg total) by mouth 3 (three) times daily. (Patient taking differently: Take 300 mg by mouth 2 (two) times daily. ) 90 capsule 2  . lidocaine-prilocaine (EMLA) cream Apply 1 application topically as needed. 30 g 3  . megestrol (MEGACE) 40 MG tablet TAKE 1 TABLET BY MOUTH 4 TIMES DAILY 120 tablet 2  . midodrine (PROAMATINE) 2.5 MG tablet Take 1 tablet (2.5 mg total) by mouth 3 (three) times daily with meals. 90 tablet 3  . Multiple Vitamins-Minerals (CENTRUM SILVER) tablet Take 1 tablet by mouth daily.    Marland Kitchen oxyCODONE-acetaminophen (PERCOCET) 10-325 MG tablet Take 1 tablet by mouth 3 (three) times daily as needed for pain. 90 tablet 0  . predniSONE (DELTASONE) 20 MG tablet Take 1 tablet (20 mg total) by mouth daily with breakfast. Once a day with food x 10 days. (Patient not taking: Reported on 06/11/2017) 10 tablet 0   No current facility-administered medications for this visit.     PHYSICAL EXAMINATION: ECOG PERFORMANCE STATUS: 1 - Symptomatic but completely ambulatory  BP 112/73   Pulse 66   Temp 97.9 F (36.6 C) (Tympanic)   Resp 18   Ht 5' (1.524 m)   Wt 116 lb (52.6 kg)   BMI 22.65 kg/m   Filed Weights   07/04/17 1052  Weight: 116 lb (52.6 kg)    GENERAL: Well-nourished well-developed; Alert, no distress and comfortable.  No wheelchair.  Accompanied by family.  EYES: no pallor or icterus OROPHARYNX: no thrush or ulceration; NECK: supple; no lymph nodes felt. LYMPH:  no palpable lymphadenopathy in the axillary or inguinal regions LUNGS: Decreased breath sounds auscultation bilaterally. No wheeze or crackles HEART/CVS: regular rate & rhythm and no murmurs; No lower  extremity edema ABDOMEN:abdomen soft, non-tender and normal bowel sounds. No hepatomegaly or splenomegaly.  Musculoskeletal:no cyanosis of digits and no clubbing  PSYCH: alert & oriented x 3 with fluent speech NEURO: no focal motor/sensory deficits SKIN:  no rashes or significant lesions    LABORATORY DATA:  I have reviewed the data as listed    Component Value Date/Time   NA 139 05/30/2017 1258   NA 139 11/04/2013 1257   K 4.3 05/30/2017 1258   K 4.3 03/05/2014 1156   CL 107 05/30/2017 1258   CL 106 11/04/2013 1257   CO2 25 05/30/2017 1258  CO2 26 11/04/2013 1257   GLUCOSE 94 05/30/2017 1258   GLUCOSE 96 11/04/2013 1257   BUN 23 (H) 05/30/2017 1258   BUN 13 11/04/2013 1257   CREATININE 1.29 (H) 05/30/2017 1258   CREATININE 0.90 11/04/2013 1257   CALCIUM 9.1 05/30/2017 1258   CALCIUM 8.5 11/04/2013 1257   PROT 6.6 04/19/2017 0940   PROT 6.8 11/04/2013 1257   ALBUMIN 3.7 04/19/2017 0940   ALBUMIN 3.5 11/04/2013 1257   AST 18 04/19/2017 0940   AST 25 11/04/2013 1257   ALT 14 04/19/2017 0940   ALT 20 11/04/2013 1257   ALKPHOS 33 (L) 04/19/2017 0940   ALKPHOS 64 11/04/2013 1257   BILITOT 0.8 04/19/2017 0940   BILITOT 0.5 11/04/2013 1257   GFRNONAA 37 (L) 05/30/2017 1258   GFRNONAA >60 11/04/2013 1257   GFRNONAA >60 03/29/2012 1650   GFRAA 43 (L) 05/30/2017 1258   GFRAA >60 11/04/2013 1257   GFRAA >60 03/29/2012 1650    No results found for: SPEP, UPEP  Lab Results  Component Value Date   WBC 4.3 04/19/2017   NEUTROABS 2.7 04/19/2017   HGB 14.0 04/19/2017   HCT 40.6 04/19/2017   MCV 94.5 04/19/2017   PLT 233 04/19/2017      Chemistry      Component Value Date/Time   NA 139 05/30/2017 1258   NA 139 11/04/2013 1257   K 4.3 05/30/2017 1258   K 4.3 03/05/2014 1156   CL 107 05/30/2017 1258   CL 106 11/04/2013 1257   CO2 25 05/30/2017 1258   CO2 26 11/04/2013 1257   BUN 23 (H) 05/30/2017 1258   BUN 13 11/04/2013 1257   CREATININE 1.29 (H) 05/30/2017  1258   CREATININE 0.90 11/04/2013 1257      Component Value Date/Time   CALCIUM 9.1 05/30/2017 1258   CALCIUM 8.5 11/04/2013 1257   ALKPHOS 33 (L) 04/19/2017 0940   ALKPHOS 64 11/04/2013 1257   AST 18 04/19/2017 0940   AST 25 11/04/2013 1257   ALT 14 04/19/2017 0940   ALT 20 11/04/2013 1257   BILITOT 0.8 04/19/2017 0940   BILITOT 0.5 11/04/2013 1257       RADIOGRAPHIC STUDIES: I have personally reviewed the radiological images as listed and agreed with the findings in the report. No results found.   ASSESSMENT & PLAN:  Endometrial cancer ([place]) # Endometrial cancer ER PR positive; stage IV-right iliac bone.  Stable; continue Megace.  #Worsening right hip pain-unlikely secondary to metastatic disease in the right pelvic bone; as March 2019 PET scan was not avid.  Suspect arthritic pain.  Recommend referral to orthopedics.  #Left upper lobe nodule-question mucoid impaction less likely metastatic disease.  We will repeat a CT scan in a few months.   # Right hip pain-secondary to cancer/worse; see discussion above. Continue on fentanyl patch to 75/ and percocet 10 every 8-12 hours.  #History of falls likely secondary to dementia/gait instability; currently undergoing physical therapy.  I discussed with neurology, Ms.ouhma-who feels patient has progressively gotten worse from dementia standpoint/unfortunately will continue to progress.  Overall prognosis is poor.  # Left lower extremity swelling chronic/DVT- continue Eliquis; but at-2.5 mg twice daily-falls.   #Metastatic lesions to the bone; continue Xgeva every 3 months; X-geva [4/24];  # follow up in 2 months-X-geva /MD/labs. referal to [place].  Cc; Dr.Shah.   Orders Placed This Encounter  Procedures  . CBC with Differential/Platelet    Standing Status:   Future    Standing  Expiration Date:   07/05/2018  . Comprehensive metabolic panel    Standing Status:   Future    Standing Expiration Date:   07/05/2018   All  questions were answered. The patient knows to call the clinic with any problems, questions or concerns.      Cammie Sickle, MD 07/04/2017 2:50 PM

## 2017-07-04 NOTE — Assessment & Plan Note (Addendum)
#   Endometrial cancer ER PR positive; stage IV-right iliac bone.  Stable; continue Megace.  #Worsening right hip pain-unlikely secondary to metastatic disease in the right pelvic bone; as March 2019 PET scan was not avid.  Suspect arthritic pain.  Recommend referral to orthopedics.  #Left upper lobe nodule-question mucoid impaction less likely metastatic disease.  We will repeat a CT scan in a few months.   # Right hip pain-secondary to cancer/worse; see discussion above. Continue on fentanyl patch to 75/ and percocet 10 every 8-12 hours.  #History of falls likely secondary to dementia/gait instability; currently undergoing physical therapy.  I discussed with neurology, Ms.ouhma-who feels patient has progressively gotten worse from dementia standpoint/unfortunately will continue to progress.  Overall prognosis is poor.  # Left lower extremity swelling chronic/DVT- continue Eliquis; but at-2.5 mg twice daily-falls.   #Metastatic lesions to the bone; continue Xgeva every 3 months; X-geva [4/24];  # follow up in 2 months-X-geva /MD/labs. referal to [place].  Cc; Dr.Shah.

## 2017-07-04 NOTE — Progress Notes (Signed)
Gynecologic Oncology Visit   Referring Provider:  Dr. Rogue Bussing  Chief Concern: recurrent endometroid endometrial adenocarcinoma, stage Ib, grade 1 currently on Megace  Subjective:  Traci [name] is a 84 y.o. female who is seen in consultation from Dr. Oliva Bustard for recurrent endometroid endometrial adenocarcinoma, stage Ib, grade 1. Diagnosed with metastatic endometrial cancer to the bone. Stable disease on Megace therapy. She is also on X-geva- every 4 weeks, calcium, and vitamin D.   March 2019 CT scan shows-stable right pelvic/ischial lesion stable; however shows a progressive /increasing size of the left upper lobe lung nodule.  PET scan March 2019-stable right pelvic/ischial lesion; only low-grade uptake in the left upper lobe lung nodule so suspected not to be due to malignancy.  She saw Dr. Rogue Bussing on today. He recommended follow up in 2 months-X-geva and continued Megace.  She continued on Eliquis for Left lower extremity swelling chronic/DVT.  She presents today for a pelvic exam. She  complains of persistent right hip/side pain extending to leg and occasional ankle swelling.   Oncology History She was diagnosed with endometrial cancer in 12/13 and had recurrence in 2016.  01/2012  endometroid endometrial adenocarcinoma, stage Ib, grade 1, positive peritoneal cytology, no other risk factors or extra-uterine disease, TAH/BSO and staging, lymphocyst post-op, vaginal brachytherapy  In 2016 patient had abnormal mammogram of the left breast and biopsy was positive for invasive carcinoma ER+/PR+ treated with lumpectomy. Staging work up showed right hydronephrosis and right pelvic mass invading bone and muscle.  See below.  10/28/14 Cystoscopy done for stent placement and showed tumor invading bladder.  BIOPSY: WELL-DIFFERENTIATED ADENOCARCINOMA MORPHOLOGICALLY SIMILAR TO PREVIOUS ENDOMETRIAL ADENOCARCINOMA.  The stent could not be placed so 11/13/14 Right PCN placed in radiology.  CT  scan 11/09/14 Bilateral pelvic lymphadenopathy, right side greater than left, consistent with metastatic disease. Small soft tissue nodule involving the left vaginal cuff and 1.5 cm soft tissue nodule or lymphadenopathy in the sigmoid mesocolon, consistent with carcinoma. Right iliac bone metastasis with associated iliopsoas soft tissue mass. Severe right hydronephrosis and diffuse right renal parenchymal atrophy secondary to pelvic metastatic disease. No metastatic disease identified within the abdomen or chest.  She underwent external pelvic radiation and completed in 11/16 with relief in the pain,  Then started on Megace 40 mg qid in December of 2016 and developed deep vein thrombosis in the left lower extremity (February 09, 2015). Hematuria due toxeralto IVC filter placement on February 11, 2015 and xeralto stopped. Cystoscopy no evidence of bleeding, so patient has been started on ELOQUIS (March 15, 2015).  Patient again had a significant bleeding with hematuria with eloquis and was discontinued and Plavix started. June 2017- PET IMPROVED. August 25 2015- LLE DVT- start Eliquis [stop plavix];   PET/CT 09/28/15 CHEST A new 9 mm hypermetabolic mediastinal lymph node is seen in the precarinal region. This measures 9 mm on image 75/3, with SUV max of 4.5. No other hypermetabolic lymph nodes identified within the thorax. No suspicious pulmonary nodules seen on CT images. Mild emphysema and bilateral upper lobe scarring again noted.  ABDOMEN/PELVIS No abnormal hypermetabolic activity within the liver, pancreas, adrenal glands, or spleen. No hypermetabolic lymph nodes or other nodules seen within in abdomen or pelvis.  Previously seen soft tissue prominence along the left pelvic sidewall obturator space, and soft tissue nodularity along the left vaginal cuff are no longer visualized. No hypermetabolic peritoneal nodules identified. No evidence of ascites.  Right ureteral stent remains in  appropriate position. No evidence of  hydronephrosis. Diffuse right renal parenchymal atrophy again noted. IVC filter remains in place. Sigmoid diverticulosis again demonstrated, without evidence of diverticulitis. Prior hysterectomy.  SKELETON Decreased size of lytic bone lesion and associated soft tissue mass involving the right ilium. This currently measures 2.9 x 4.1 cm on image 184/3 compared to 3.4 x 4.4 cm previously. This shows low-grade metabolic activity with SUV max of 3.5. No other suspicious bone lesions identified. Lumbar spine fusion hardware again noted.  IMPRESSION: New 9 mm hypermetabolic precarinal mediastinal lymph node. Differential diagnosis includes metastatic disease and reactive/ inflammatory etiology. Recommend continued followup by chest CT with contrast in 3-6 months.  Decreased size of lytic bone metastasis involving the right ilium.  No other sites of metabolically active metastatic disease identified   01/03/2016 PET/CT result below.   FINDINGS:  Hypermetabolic low right paratracheal node and right iliac wing metastasis stable.  CHEST Left thyroid is mildly hypermetabolic, as before, nonspecific. Low right paratracheal lymph node measures 7 mm (CT image 80) with an SUV max of 4.7, stable from 09/28/2015. SKELETON An expansile lytic lesion in the right iliac wing is unchanged, measuring 3.5 x 3.7 cm and with an SUV max of 3.1, as before.  08/14/2016 IMPRESSION: No acute abnormality or finding to explain the patient's symptoms. No change in chronic lytic lesion in the right ilium. Sigmoid diverticulosis without diverticulitis.  DEC 2018- abdomen pelvis CT scan stable right iliac lesion. Given the absence of significant bony metastatic disease/stable disease he recommended switching Xgeva to every 8 weeks.    TUMOR TESTING MOLECULAR STUDIES:  08/23/15- MICROSATELLITE INSTABILITY IMMUNOHISTOCHEMISTRY  MISMATCH REPAIR PROTEINS:   MLH1: Intact  nuclear expression  MSH2: Intact nuclear expression  MSH6: Intact nuclear expression  PMS2: Intact nuclear expression   Interpretation: No loss of nuclear expression of mismatch repair  proteins: Low probability of MSI-H  Health maintenance: Her breast cancer screening is done by her PCP, Dr. Doy Hutching, and  mammogram 10/12/2016 negative for malignancy  Problem List: Patient Active Problem List   Diagnosis Date Noted  . Cancer, metastatic to bone ([place]) 06/21/2016  . Pain and swelling of left lower leg 08/25/2015  . Seizure disorder ([place]) 04/06/2015  . Personal history of urinary infection 03/15/2015  . Cerebrovascular accident (CVA) ([place]) 02/18/2015  . Endometrial cancer ([place]) 11/18/2014  . Degeneration of intervertebral disc of lumbosacral region 11/14/2014  . Overflow incontinence 11/14/2014  . Pure hypercholesterolemia 11/14/2014  . Acquired hypothyroidism 10/20/2014  . DDD (degenerative disc disease), lumbosacral 10/20/2014  . Benign neoplasm of colon 10/20/2014  . Acid reflux 10/20/2014  . Esophageal stenosis 10/20/2014  . Bloodgood disease 10/20/2014  . History of colon polyps 10/20/2014  . H/O gastric ulcer 10/20/2014  . Healed or old pulmonary embolism 10/20/2014  . History of urinary anomaly 10/20/2014  . BP (high blood pressure) 10/20/2014  . Hypersomnia with sleep apnea 10/20/2014  . Multinodular goiter 10/20/2014  . Angina pectoris ([place]) 10/20/2014  . Seizure ([place]) 10/20/2014  . Digestive symptom 10/20/2014  . Incontinence overflow, urine 10/20/2014  . Dupuytren's contracture of foot 10/20/2014  . Hypercholesterolemia without hypertriglyceridemia 10/20/2014  . Cancer of left breast ([place]) 10/20/2014  . History of endometrial cancer 09/30/2014  . Lumbar radiculopathy 09/04/2014  . Deep vein thrombosis ([place]) 07/15/2013  . Deep vein thrombosis (DVT) ([place]) 07/15/2013  . Stroke [facility]) 04/07/2011    Past Medical History: Past Medical History:  Diagnosis Date  .  Arthritis    Degenerative Disc Disease  . Bone cancer ([place])   .  Breast cancer ([place]) 2016   LT LUMPECTOMY 03-2015  . Chronic kidney disease    HYDRONEPHROSIS  . Deep vein blood clot of left lower extremity ([place]) February 11, 2015   Left Leg  . Endometrial adenocarcinoma ([place]) 01/2012    stage Ib, grade 1, positive peritoneal cytology, no other risk factors or extra-uterine disease  . Gastric ulcer   . GERD (gastroesophageal reflux disease)   . Hearing loss   . Hiccups   . History of brachytherapy   . History of colon polyps   . History of DVT (deep vein thrombosis)   . History of esophageal stricture   . History of shingles   . Inguinal lymphocyst   . Mild aphasia   . Pain    SEVERE CHRONIC RIGHT LEG,HIP,FOOT  . PE (pulmonary embolism)    H/O  . Stroke [facility]) 04/06/2011   residual:  aphasia    Past Surgical History: Past Surgical History:  Procedure Laterality Date  . ABDOMINAL HYSTERECTOMY  December 2013   history of cervical cancer  . BACK SURGERY     fusion of L3&4  . BREAST BIOPSY Left 10/13/2014   POS  . BREAST LUMPECTOMY Left 03/2015  . CATARACT EXTRACTION W/ INTRAOCULAR LENS IMPLANT     right  . CYSTOSCOPY W/ RETROGRADES Right 10/28/2014   Procedure: CYSTOSCOPY WITH RETROGRADE PYELOGRAM ATTEMPT, UNABLE TO PASS ;  Surgeon: [name], MD;  Location: [facility] ORS;  Service: Urology;  Laterality: Right;  . CYSTOSCOPY W/ RETROGRADES Right 09/15/2015   Procedure: CYSTOSCOPY WITH RETROGRADE PYELOGRAM;  Surgeon: [name], MD;  Location: [facility] OR[facility];  Service: Urology;  Laterality: Right;  . CYSTOSCOPY W/ URETERAL STENT PLACEMENT Right 03/10/2015   Procedure: CYSTOSCOPY WITH STENT REPLACEMENT;  Surgeon: [name], MD;  Location: [facility] OR[facility];  Service: Urology;  Laterality: Right;  . CYSTOSCOPY W/ URETERAL STENT PLACEMENT Right 06/09/2015   Procedure: CYSTOSCOPY WITH STENT REPLACEMENT;  Surgeon: [name], MD;  Location: [facility] OR[facility];  Service: Urology;   Laterality: Right;  . CYSTOSCOPY W/ URETERAL STENT PLACEMENT Right 09/15/2015   Procedure: CYSTOSCOPY WITH STENT REPLACEMENT;  Surgeon: [name], MD;  Location: [facility];  Service: Urology;  Laterality: Right;  . CYSTOSCOPY WITH STENT PLACEMENT Right 10/28/2014   Procedure: BLADDER BIOPSY WITH FULGERATION ;  Surgeon: [name], MD;  Location: [facility];  Service: Urology;  Laterality: Right;  . ESOPHAGEAL DILATION    . EYE SURGERY Bilateral    Cataract Extraction  . PARTIAL MASTECTOMY WITH NEEDLE LOCALIZATION Left 03/30/2015   Procedure: PARTIAL MASTECTOMY WITH NEEDLE LOCALIZATION;  Surgeon: [name], MD;  Location: [facility];  Service: General;  Laterality: Left;  . PERIPHERAL VASCULAR CATHETERIZATION N/A 02/11/2015   Procedure: IVC Filter Insertion;  Surgeon: Algernon Huxley, MD;  Location: [place] [facility];  Service: Cardiovascular;  Laterality: N/A;  . PORTACATH PLACEMENT Right 11/26/2014   Procedure: INSERTION PORT-A-CATH;  Surgeon: [name], MD;  Location: [facility];  Service: General;  Laterality: Right;  . SENTINEL NODE BIOPSY Left 03/30/2015   Procedure: SENTINEL NODE BIOPSY;  Surgeon: [name], MD;  Location: [facility];  Service: General;  Laterality: Left;  . thyroid removed    . THYROIDECTOMY, PARTIAL    . TONSILLECTOMY    . TOTAL ABDOMINAL HYSTERECTOMY W/ BILATERAL SALPINGOOPHORECTOMY Bilateral    staging biopsies    Past Gynecologic History:  See HPI  OB History:  OB History  Gravida Para Term Preterm  AB Living  2            SAB TAB Ectopic Multiple Live Births               # Outcome Date GA Lbr Len/2nd Weight Sex Delivery Anes PTL Lv  2 Gravida           1 Gravida             Family History: Family History  Problem Relation Age of Onset  . Heart attack Mother   . Breast cancer Maternal Aunt        x 2    Social History: Social History   Socioeconomic History  . Marital status: Married    Spouse name: Not on  file  . Number of children: Not on file  . Years of education: Not on file  . Highest education level: Not on file  Occupational History  . Not on file  Social Needs  . Financial resource strain: Not on file  . Food insecurity:    Worry: Not on file    Inability: Not on file  . Transportation needs:    Medical: Not on file    Non-medical: Not on file  Tobacco Use  . Smoking status: Never Smoker  . Smokeless tobacco: Never Used  Substance and Sexual Activity  . Alcohol use: No    Alcohol/week: 0.0 oz  . Drug use: No  . Sexual activity: Yes    Partners: Male  Lifestyle  . Physical activity:    Days per week: Not on file    Minutes per session: Not on file  . Stress: Not on file  Relationships  . Social connections:    Talks on phone: Not on file    Gets together: Not on file    Attends religious service: Not on file    Active member of club or organization: Not on file    Attends meetings of clubs or organizations: Not on file    Relationship status: Not on file  . Intimate partner violence:    Fear of current or ex partner: Not on file    Emotionally abused: Not on file    Physically abused: Not on file    Forced sexual activity: Not on file  Other Topics Concern  . Not on file  Social History Narrative  . Not on file    Allergies: Allergies  Allergen Reactions  . Penicillins Rash    .Has patient had a PCN reaction causing immediate rash, facial/tongue/throat swelling, SOB or lightheadedness with hypotension: No Has patient had a PCN reaction causing severe rash involving mucus membranes or skin necrosis: No Has patient had a PCN reaction that required hospitalization: No Has patient had a PCN reaction occurring within the last 10 years: Unknown If all of the above answers are "NO", then may proceed with Cephalosporin use.   . Acetaminophen     Other reaction(s): Unknown  . Hydrocodone-Acetaminophen Nausea Only  . Novocain [Procaine] Other (See Comments) and  Nausea And Vomiting    Other reaction(s): Unknown Chest pain  . Sulfa Antibiotics Other (See Comments)    CHEST PAIN  . Valium [Diazepam] Other (See Comments)    disorientation    Current Medications: Current Outpatient Medications  Medication Sig Dispense Refill  . apixaban (ELIQUIS) 2.5 MG TABS tablet Take 1 tablet (2.5 mg total) by mouth 2 (two) times daily. 60 tablet 4  . donepezil (ARICEPT) 10 MG tablet Take 0.5 tablets by  mouth 2 (two) times daily.     . fentaNYL (DURAGESIC - DOSED MCG/HR) 75 MCG/HR Place 1 patch (75 mcg total) onto the skin every 3 (three) days. 10 patch 0  . gabapentin (NEURONTIN) 300 MG capsule Take 1 capsule (300 mg total) by mouth 3 (three) times daily. (Patient taking differently: Take 300 mg by mouth 2 (two) times daily. ) 90 capsule 2  . lidocaine-prilocaine (EMLA) cream Apply 1 application topically as needed. 30 g 3  . megestrol (MEGACE) 40 MG tablet TAKE 1 TABLET BY MOUTH 4 TIMES DAILY 120 tablet 2  . midodrine (PROAMATINE) 2.5 MG tablet Take 1 tablet (2.5 mg total) by mouth 3 (three) times daily with meals. 90 tablet 3  . Multiple Vitamins-Minerals (CENTRUM SILVER) tablet Take 1 tablet by mouth daily.    Marland Kitchen oxyCODONE-acetaminophen (PERCOCET) 10-325 MG tablet Take 1 tablet by mouth 3 (three) times daily as needed for pain. 90 tablet 0  . predniSONE (DELTASONE) 20 MG tablet Take 1 tablet (20 mg total) by mouth daily with breakfast. Once a day with food x 10 days. (Patient not taking: Reported on 06/11/2017) 10 tablet 0   No current facility-administered medications for this visit.     Review of Systems - as per interval history General: no complaints  HEENT: no complaints  Lungs: no complaints  Cardiac: no complaints  GI: no complaints  GU: no complaints  Musculoskeletal: h/o back and right hip pain  Extremities: occasional ankle swelling  Skin: no complaints  Neuro: history or neuropathic pain in her RLE due to back issues in the past - no complaints  today  Endocrine: no complaints  Psych: h/o feeling sad but no complaints today      Objective:  Physical Examination:  BP 112/73 (Patient Position: Sitting)   Pulse 66   Temp 97.9 F (36.6 C)   Ht 5' (1.524 m)   Wt 116 lb (52.6 kg)   BMI 22.65 kg/m    ECOG Performance Status: 2 - Symptomatic, <50% confined to bed  General appearance: alert, cooperative and appears stated age HEENT: ATNC Abdomen: SNDNT. No hernias, no masses, no ascites.  Extremities:No edema Pelvic: exam chaperoned by nurse;  Vulva:  no masses, swelling, tenderness or lesions; Vagina: agglutinated and length is only 4 cm, no tumor  palpated, no discharge appreciated at all; Adnexa: surgically absent; Uterus and Cervix: surgically absent, vaginal cuff not visualized due to agglutination, speculum exam not even attempted;  No masses.  Rectal: deferred.    Assessment:  Traci [name] is a 84 y.o. female diagnosed with pelvic recurrence of stage IB grade 1 endometroid endometrial adenocarcinoma s/p surgery and brachytherapy 2013. She had a right pelvic side wall mass invading bone/bladder and hydronephrosis, and got a ureteral stent and external pelvic radiation in 11/16.  On adjuvant Megace and X-geva and has residual disease in pelvis, with partial response on PET/CT followed by stable CT scan and subsequent scans.   Left lower extremity swelling chronic/DVT- continue Eliquis. Has had issues with VTE and bleeding managed by Dr Rogue Bussing. IVC filter remains in place.    History of atrophic right kidney and has right ureteral stent managed by Urology.   Plan:   Problem List Items Addressed This Visit      Genitourinary   Endometrial cancer ([place]) - Primary      Continue Megace and X-geva therapy per Dr Rogue Bussing. I recommended that she follow up as needed for gynecologic related symptoms given stable pelvic exam.  I personally reviewed the patient's history, completed key elements of her exam, and was  involved in decision making in conjunction with Ms. Allen.   Gillis Ends, MD CC: Dr. Ouida Sills

## 2017-07-04 NOTE — Progress Notes (Signed)
Patient reports leg edema in ankles. Patient does not elevate her legs until bedtime per pt's husband.

## 2017-07-05 ENCOUNTER — Ambulatory Visit: Payer: Medicare Other

## 2017-07-05 VITALS — BP 114/65 | HR 84

## 2017-07-05 DIAGNOSIS — R2681 Unsteadiness on feet: Secondary | ICD-10-CM

## 2017-07-05 DIAGNOSIS — M6281 Muscle weakness (generalized): Secondary | ICD-10-CM

## 2017-07-05 NOTE — Therapy (Signed)
[place] MAIN [facility] SERVICES [address] [place], [place], [address] Phone: [phone]   Fax:  [phone]  Physical Therapy Treatment  Patient Details  Name: Traci [name] MRN: [id number] Date of Birth: [birth year] Referring Provider: Jennings [name]   Encounter Date: 07/05/2017  PT End of Session - 07/05/17 1107    Visit Number  5    Number of Visits  17    Date for PT Re-Evaluation  08/06/17    Authorization Type  progress note 5/10    PT Start Time  1105    PT Stop Time  1150    PT Time Calculation (min)  45 min    Equipment Utilized During Treatment  Gait belt    Activity Tolerance  Patient tolerated treatment well    Behavior During Therapy  [facility] for tasks assessed/performed;Flat affect       Past Medical History:  Diagnosis Date  . Arthritis    Degenerative Disc Disease  . Bone cancer ([place])   . Breast cancer ([place]) 2016   LT LUMPECTOMY 03-2015  . Chronic kidney disease    HYDRONEPHROSIS  . Deep vein blood clot of left lower extremity ([place]) February 11, 2015   Left Leg  . Endometrial adenocarcinoma ([place]) 01/2012    stage Ib, grade 1, positive peritoneal cytology, no other risk factors or extra-uterine disease  . Gastric ulcer   . GERD (gastroesophageal reflux disease)   . Hearing loss   . Hiccups   . History of brachytherapy   . History of colon polyps   . History of DVT (deep vein thrombosis)   . History of esophageal stricture   . History of shingles   . Inguinal lymphocyst   . Mild aphasia   . Pain    SEVERE CHRONIC RIGHT LEG,HIP,FOOT  . PE (pulmonary embolism)    H/O  . Stroke [facility]) 04/06/2011   residual:  aphasia    Past Surgical History:  Procedure Laterality Date  . ABDOMINAL HYSTERECTOMY  December 2013   history of cervical cancer  . BACK SURGERY     fusion of L3&4  . BREAST BIOPSY Left 10/13/2014   POS  . BREAST LUMPECTOMY Left 03/2015  . CATARACT EXTRACTION W/ INTRAOCULAR LENS IMPLANT     right  .  CYSTOSCOPY W/ RETROGRADES Right 10/28/2014   Procedure: CYSTOSCOPY WITH RETROGRADE PYELOGRAM ATTEMPT, UNABLE TO PASS ;  Surgeon: [name], MD;  Location: [facility];  Service: Urology;  Laterality: Right;  . CYSTOSCOPY W/ RETROGRADES Right 09/15/2015   Procedure: CYSTOSCOPY WITH RETROGRADE PYELOGRAM;  Surgeon: [name], MD;  Location: [facility];  Service: Urology;  Laterality: Right;  . CYSTOSCOPY W/ URETERAL STENT PLACEMENT Right 03/10/2015   Procedure: CYSTOSCOPY WITH STENT REPLACEMENT;  Surgeon: [name], MD;  Location: [facility];  Service: Urology;  Laterality: Right;  . CYSTOSCOPY W/ URETERAL STENT PLACEMENT Right 06/09/2015   Procedure: CYSTOSCOPY WITH STENT REPLACEMENT;  Surgeon: [name], MD;  Location: [facility];  Service: Urology;  Laterality: Right;  . CYSTOSCOPY W/ URETERAL STENT PLACEMENT Right 09/15/2015   Procedure: CYSTOSCOPY WITH STENT REPLACEMENT;  Surgeon: [name], MD;  Location: [facility];  Service: Urology;  Laterality: Right;  . CYSTOSCOPY WITH STENT PLACEMENT Right 10/28/2014   Procedure: BLADDER BIOPSY WITH FULGERATION ;  Surgeon: [name], MD;  Location: [facility];  Service: Urology;  Laterality: Right;  . ESOPHAGEAL DILATION    . EYE SURGERY Bilateral  Cataract Extraction  . PARTIAL MASTECTOMY WITH NEEDLE LOCALIZATION Left 03/30/2015   Procedure: PARTIAL MASTECTOMY WITH NEEDLE LOCALIZATION;  Surgeon: [name], MD;  Location: [facility];  Service: General;  Laterality: Left;  . PERIPHERAL VASCULAR CATHETERIZATION N/A 02/11/2015   Procedure: IVC Filter Insertion;  Surgeon: Algernon Huxley, MD;  Location: [place] [facility];  Service: Cardiovascular;  Laterality: N/A;  . PORTACATH PLACEMENT Right 11/26/2014   Procedure: INSERTION PORT-A-CATH;  Surgeon: [name], MD;  Location: [facility];  Service: General;  Laterality: Right;  . SENTINEL NODE BIOPSY Left 03/30/2015   Procedure: SENTINEL NODE BIOPSY;  Surgeon:  [name], MD;  Location: [facility];  Service: General;  Laterality: Left;  . thyroid removed    . THYROIDECTOMY, PARTIAL    . TONSILLECTOMY    . TOTAL ABDOMINAL HYSTERECTOMY W/ BILATERAL SALPINGOOPHORECTOMY Bilateral    staging biopsies    Vitals:   07/05/17 1109  BP: 114/65  Pulse: 84  SpO2: 99%    Subjective Assessment - 07/05/17 1107    Subjective  Husband denies any falls since the last visit or since starting therapy. No health changes other than multiple "bad days" with respect to her memory. He is unsure whether PT is helpful but reports that she has not fallen since starting therapy. No visible signs of pain upon arrival and pt denies pain. No specific questions or concerns from pt or husband    Patient is accompained by:  Family member Husband    Pertinent History  Traci [name] is a 84 y.o. female referred for imbalance and frequent falls. She is AOx1 at time of initial evaluation and husband reports that this is her baseline. She has difficulty with word finding. She has a history of CVA with R sided deficits initially but husband states no further weakness. Multiple lacunar infarcts found on MRI. Patient diagnosed with breast, abdominal cancer recently and has had several rounds of radiation. Her husband reports frequent falls, at least 14 in the last month. She would like to work on her balance to prevent falls. Multiple comorbid conditions.     Limitations  Walking    Diagnostic tests  MRI Stable noncontrast MRI appearance the brain since 2018. Scattered chronic small vessel ischemic sequelae which overall is mild for age    Patient Stated Goals  Decrease falls    Currently in Pain?  No/denies          TREATMENT  Ther-ex NuStep L1 x 5 minutes for warm-upseat position 8, hand position 9, speed around 30 spm (unbilled); Quantum bilateral leg press 105# x 10, x 15; Sit to stand 2 x10;  Seated marches with 2# ankle weights 2 x 15; Seated adductor squeeze with  manual resistance 2 x 15; Seated clams with manual resistance 2 x 15; Seated LAQ with 2# ankle weights 2 x 15;  Pt requires extensive verbal and tactile cues for exercises as well as demonstration so less exercises are able to be completed during session.                PT Education - 07/05/17 1115    Education provided  Yes    Education Details  exercise form/technique    Person(s) Educated  Patient    Methods  Explanation    Comprehension  Verbalized understanding       PT Short Term Goals - 06/12/17 1553      PT SHORT TERM GOAL #1   Title  Pt will  be independent with HEP in order to improve strength and balance in order to decrease fall risk and improve function at home and work.     Time  8    Period  Weeks    Status  New    Target Date  08/06/17        PT Long Term Goals - 06/19/17 2106      PT LONG TERM GOAL #1   Title  Pt will decrease 5TSTS by at least 3 seconds in order to demonstrate clinically significant improvement in LE strength     Baseline  06/11/17: 23.8s    Time  8    Period  Weeks    Status  New    Target Date  08/06/17      PT LONG TERM GOAL #2   Title  Pt will increase 10MWT by at least 0.13 m/s in order to demonstrate clinically significant improvement in community ambulation.    Baseline  06/11/17: self selected 24.5=0.41 m/s, fastest: 18.0=0.56 m/s     Time  8    Period  Weeks    Status  New    Target Date  08/06/17      PT LONG TERM GOAL #3   Title  Pt will decrease TUG to below 14 seconds in order to demonstrate decreased fall risk     Baseline  06/11/17: 36.4s    Time  8    Period  Weeks    Status  New    Target Date  08/06/17      PT LONG TERM GOAL #4   Title  Pt will improve BERG by at least 3 points in order to demonstrate clinically significant improvement in balance.    Baseline  06/19/17: 32/56    Time  8    Status  New    Target Date  08/06/17            Plan - 07/05/17 1108    Clinical Impression Statement   Pt continues to require extensive verbal and tactile cues for exercises as well as demonstration so less exercises are able to be completed today than during a typical session. Her cognition is somewhat limiting and pt requires frequent redirection. Husband reports that pt has not fallen since starting therapy. She demonstrates min to mod fatigue with seated rest breaks during session. Pt and husband encouraged to continue HEP and follow-up as scheduled.    Rehab Potential  Fair    Clinical Impairments Affecting Rehab Potential  positive: motivation, support from husband; negative: cognitive impairments, age    PT Frequency  2x / week    PT Duration  8 weeks    PT Treatment/Interventions  Aquatic Therapy;Iontophoresis 4mg /ml Dexamethasone;ADLs/Self Care Home Management;Canalith Repostioning;Cryotherapy;Electrical Stimulation;Moist Heat;Traction;Ultrasound;Gait training;DME Instruction;Functional mobility training;Therapeutic activities;Therapeutic exercise;Balance training;Neuromuscular re-education;Stair training;Patient/family education;Manual techniques;Energy conservation;Vestibular    PT Next Visit Plan  progress balance and strengthening    PT Home Exercise Plan  modified tandem balance with guarding by husband, sit to stand without UE support    Consulted and Agree with Plan of Care  Patient;Family member/caregiver    Family Member Consulted  Husband       Patient will benefit from skilled therapeutic intervention in order to improve the following deficits and impairments:  Abnormal gait, Decreased balance, Decreased strength, Decreased knowledge of use of DME, Difficulty walking  Visit Diagnosis: Unsteadiness on feet  Muscle weakness (generalized)     Problem List Patient Active Problem List  Diagnosis Date Noted  . Cancer, metastatic to bone ([place]) 06/21/2016  . Pain and swelling of left lower leg 08/25/2015  . Seizure disorder ([place]) 04/06/2015  . Personal history of urinary  infection 03/15/2015  . Cerebrovascular accident (CVA) ([place]) 02/18/2015  . Endometrial cancer ([place]) 11/18/2014  . Degeneration of intervertebral disc of lumbosacral region 11/14/2014  . Overflow incontinence 11/14/2014  . Pure hypercholesterolemia 11/14/2014  . Acquired hypothyroidism 10/20/2014  . DDD (degenerative disc disease), lumbosacral 10/20/2014  . Benign neoplasm of colon 10/20/2014  . Acid reflux 10/20/2014  . Esophageal stenosis 10/20/2014  . Bloodgood disease 10/20/2014  . History of colon polyps 10/20/2014  . H/O gastric ulcer 10/20/2014  . Healed or old pulmonary embolism 10/20/2014  . History of urinary anomaly 10/20/2014  . BP (high blood pressure) 10/20/2014  . Hypersomnia with sleep apnea 10/20/2014  . Multinodular goiter 10/20/2014  . Angina pectoris ([place]) 10/20/2014  . Seizure ([place]) 10/20/2014  . Digestive symptom 10/20/2014  . Incontinence overflow, urine 10/20/2014  . Dupuytren's contracture of foot 10/20/2014  . Hypercholesterolemia without hypertriglyceridemia 10/20/2014  . Cancer of left breast ([place]) 10/20/2014  . History of endometrial cancer 09/30/2014  . Lumbar radiculopathy 09/04/2014  . Deep vein thrombosis ([place]) 07/15/2013  . Deep vein thrombosis (DVT) ([place]) 07/15/2013  . Stroke [facility]) 04/07/2011   Phillips Grout PT, DPT   Phill Steck 07/06/2017, 3:19 PM  [place] MAIN [facility] SERVICES [address] [place], [place], [address] Phone: [phone]   Fax:  [phone]  Name: Traci [name] MRN: [id number] Date of Birth: 1929-11-25

## 2017-07-10 ENCOUNTER — Encounter: Payer: Self-pay | Admitting: Physical Therapy

## 2017-07-10 ENCOUNTER — Ambulatory Visit: Payer: Medicare Other | Attending: Neurology | Admitting: Physical Therapy

## 2017-07-10 DIAGNOSIS — M6281 Muscle weakness (generalized): Secondary | ICD-10-CM | POA: Insufficient documentation

## 2017-07-10 DIAGNOSIS — R2681 Unsteadiness on feet: Secondary | ICD-10-CM

## 2017-07-10 NOTE — Therapy (Signed)
[place] MAIN [facility] SERVICES [address] [place], [place], [address] Phone: [phone]   Fax:  [phone]  Physical Therapy Treatment  Patient Details  Name: Traci [name] MRN: [id number] Date of Birth: [birth year] Referring Provider: Jennings Books   Encounter Date: 07/10/2017  PT End of Session - 07/10/17 1057    Visit Number  6    Number of Visits  17    Date for PT Re-Evaluation  08/06/17    Authorization Type  progress note 6/10    PT Start Time  1046    PT Stop Time  1130    PT Time Calculation (min)  44 min    Equipment Utilized During Treatment  Gait belt    Activity Tolerance  Patient tolerated treatment well    Behavior During Therapy  [facility] for tasks assessed/performed;Flat affect       Past Medical History:  Diagnosis Date  . Arthritis    Degenerative Disc Disease  . Bone cancer ([place])   . Breast cancer ([place]) 2016   LT LUMPECTOMY 03-2015  . Chronic kidney disease    HYDRONEPHROSIS  . Deep vein blood clot of left lower extremity ([place]) February 11, 2015   Left Leg  . Endometrial adenocarcinoma ([place]) 01/2012    stage Ib, grade 1, positive peritoneal cytology, no other risk factors or extra-uterine disease  . Gastric ulcer   . GERD (gastroesophageal reflux disease)   . Hearing loss   . Hiccups   . History of brachytherapy   . History of colon polyps   . History of DVT (deep vein thrombosis)   . History of esophageal stricture   . History of shingles   . Inguinal lymphocyst   . Mild aphasia   . Pain    SEVERE CHRONIC RIGHT LEG,HIP,FOOT  . PE (pulmonary embolism)    H/O  . Stroke [facility]) 04/06/2011   residual:  aphasia    Past Surgical History:  Procedure Laterality Date  . ABDOMINAL HYSTERECTOMY  December 2013   history of cervical cancer  . BACK SURGERY     fusion of L3&4  . BREAST BIOPSY Left 10/13/2014   POS  . BREAST LUMPECTOMY Left 03/2015  . CATARACT EXTRACTION W/ INTRAOCULAR LENS IMPLANT     right  .  CYSTOSCOPY W/ RETROGRADES Right 10/28/2014   Procedure: CYSTOSCOPY WITH RETROGRADE PYELOGRAM ATTEMPT, UNABLE TO PASS ;  Surgeon: [name], MD;  Location: [facility];  Service: Urology;  Laterality: Right;  . CYSTOSCOPY W/ RETROGRADES Right 09/15/2015   Procedure: CYSTOSCOPY WITH RETROGRADE PYELOGRAM;  Surgeon: [name], MD;  Location: [facility];  Service: Urology;  Laterality: Right;  . CYSTOSCOPY W/ URETERAL STENT PLACEMENT Right 03/10/2015   Procedure: CYSTOSCOPY WITH STENT REPLACEMENT;  Surgeon: [name], MD;  Location: [facility];  Service: Urology;  Laterality: Right;  . CYSTOSCOPY W/ URETERAL STENT PLACEMENT Right 06/09/2015   Procedure: CYSTOSCOPY WITH STENT REPLACEMENT;  Surgeon: [name], MD;  Location: [facility];  Service: Urology;  Laterality: Right;  . CYSTOSCOPY W/ URETERAL STENT PLACEMENT Right 09/15/2015   Procedure: CYSTOSCOPY WITH STENT REPLACEMENT;  Surgeon: [name], MD;  Location: [facility];  Service: Urology;  Laterality: Right;  . CYSTOSCOPY WITH STENT PLACEMENT Right 10/28/2014   Procedure: BLADDER BIOPSY WITH FULGERATION ;  Surgeon: [name], MD;  Location: [facility];  Service: Urology;  Laterality: Right;  . ESOPHAGEAL DILATION    . EYE SURGERY Bilateral  Cataract Extraction  . PARTIAL MASTECTOMY WITH NEEDLE LOCALIZATION Left 03/30/2015   Procedure: PARTIAL MASTECTOMY WITH NEEDLE LOCALIZATION;  Surgeon: [name], MD;  Location: [facility];  Service: General;  Laterality: Left;  . PERIPHERAL VASCULAR CATHETERIZATION N/A 02/11/2015   Procedure: IVC Filter Insertion;  Surgeon: Algernon Huxley, MD;  Location: [place] [facility];  Service: Cardiovascular;  Laterality: N/A;  . PORTACATH PLACEMENT Right 11/26/2014   Procedure: INSERTION PORT-A-CATH;  Surgeon: [name], MD;  Location: [facility];  Service: General;  Laterality: Right;  . SENTINEL NODE BIOPSY Left 03/30/2015   Procedure: SENTINEL NODE BIOPSY;  Surgeon:  [name], MD;  Location: [facility];  Service: General;  Laterality: Left;  . thyroid removed    . THYROIDECTOMY, PARTIAL    . TONSILLECTOMY    . TOTAL ABDOMINAL HYSTERECTOMY W/ BILATERAL SALPINGOOPHORECTOMY Bilateral    staging biopsies    There were no vitals filed for this visit.  Subjective Assessment - 07/10/17 1054    Subjective  Patient and husband denies any recent falls; Patient reports having mild low back pain this morning; No significant changes;     Patient is accompained by:  Family member Husband    Pertinent History  Traci [name] is a 84 y.o. female referred for imbalance and frequent falls. She is AOx1 at time of initial evaluation and husband reports that this is her baseline. She has difficulty with word finding. She has a history of CVA with R sided deficits initially but husband states no further weakness. Multiple lacunar infarcts found on MRI. Patient diagnosed with breast, abdominal cancer recently and has had several rounds of radiation. Her husband reports frequent falls, at least 14 in the last month. She would like to work on her balance to prevent falls. Multiple comorbid conditions.     Limitations  Walking    Diagnostic tests  MRI Stable noncontrast MRI appearance the brain since 2018. Scattered chronic small vessel ischemic sequelae which overall is mild for age    Patient Stated Goals  Decrease falls    Currently in Pain?  Yes    Pain Score  2     Pain Location  Back    Pain Orientation  Right    Pain Descriptors / Indicators  Aching;Sore    Pain Type  Chronic pain    Pain Onset  More than a month ago    Pain Frequency  Intermittent    Aggravating Factors   worse with prolonged standing/walking    Pain Relieving Factors  rest/sitting    Effect of Pain on Daily Activities  decreased activity tolerance;     Multiple Pain Sites  No           TREATMENT  Ther-ex NuStep L0 x 4 minutes for warm-upseat position8, hand position9, speed around 30  spm(unbilled);  Seated with 2# ankle weights: Alternate march 2x15 bilaterally with min VCs to increase hip flexion for better strengthening;  LAQ with 2x15 bilaterally;  Hip adductor ball squeezes 2 x15 bilaterally; Patient required min VCs to increase ROM and slow down LE movement for better strengthening; Patient often forgets about exercise and requires constant cues and instruction to attenuate to task;    Balance:  Standing on airex: Feet apart, weight shift side/side x10 reps each direction with CGA for safety; Feet together, eyes open/closed 10 sec hold x2 reps with min A for safety; Patient exhibits heavy posterior lean with eyes closed requiring cues weight shift for better  stance control;  Modified tandem stance, head turns side/side x5 reps each direction with min VCs for gaze stabilization for better static balance;    Standing on 1/2 bolster (Flat side up): Heel/toe rock x10 reps  Feet apart, Alternate UE lift x10 reps with cues to keep ankle in neutral for increased balance challenge; Patient required min A for balance and mod VCs for weight shift for safety; Patient often loses balance posteriorly;                        PT Education - 07/10/17 1057    Education provided  Yes    Education Details  exercise technique, balance, positioning;     Person(s) Educated  Patient    Methods  Explanation;Demonstration;Verbal cues    Comprehension  Verbalized understanding;Returned demonstration;Need further instruction;Verbal cues required       PT Short Term Goals - 06/12/17 1553      PT SHORT TERM GOAL #1   Title  Pt will be independent with HEP in order to improve strength and balance in order to decrease fall risk and improve function at home and work.     Time  8    Period  Weeks    Status  New    Target Date  08/06/17        PT Long Term Goals - 06/19/17 2106      PT LONG TERM GOAL #1   Title  Pt will decrease 5TSTS by at least 3 seconds  in order to demonstrate clinically significant improvement in LE strength     Baseline  06/11/17: 23.8s    Time  8    Period  Weeks    Status  New    Target Date  08/06/17      PT LONG TERM GOAL #2   Title  Pt will increase 10MWT by at least 0.13 m/s in order to demonstrate clinically significant improvement in community ambulation.    Baseline  06/11/17: self selected 24.5=0.41 m/s, fastest: 18.0=0.56 m/s     Time  8    Period  Weeks    Status  New    Target Date  08/06/17      PT LONG TERM GOAL #3   Title  Pt will decrease TUG to below 14 seconds in order to demonstrate decreased fall risk     Baseline  06/11/17: 36.4s    Time  8    Period  Weeks    Status  New    Target Date  08/06/17      PT LONG TERM GOAL #4   Title  Pt will improve BERG by at least 3 points in order to demonstrate clinically significant improvement in balance.    Baseline  06/19/17: 32/56    Time  8    Status  New    Target Date  08/06/17            Plan - 07/10/17 1120    Clinical Impression Statement  Patient requires frequent verbal cues for correct exercise technique to improve strength and balance. She reports less back pain today; However she does exhibit significant scoliotic curve which limits posture in standing which contributes to imbalance with heavy right lateral lean. Patient instructed in advanced balance exercise on compliant surface to challenge stance control. She reports some fatigue at end of session requiring short rest breaks. Patient would benefit from additional skilled PT intervention to improve strength, balance and  gait safety;      Rehab Potential  Fair    Clinical Impairments Affecting Rehab Potential  positive: motivation, support from husband; negative: cognitive impairments, age    PT Frequency  2x / week    PT Duration  8 weeks    PT Treatment/Interventions  Aquatic Therapy;Iontophoresis 4mg /ml Dexamethasone;ADLs/Self Care Home Management;Canalith  Repostioning;Cryotherapy;Electrical Stimulation;Moist Heat;Traction;Ultrasound;Gait training;DME Instruction;Functional mobility training;Therapeutic activities;Therapeutic exercise;Balance training;Neuromuscular re-education;Stair training;Patient/family education;Manual techniques;Energy conservation;Vestibular    PT Next Visit Plan  progress balance and strengthening    PT Home Exercise Plan  modified tandem balance with guarding by husband, sit to stand without UE support    Consulted and Agree with Plan of Care  Patient;Family member/caregiver    Family Member Consulted  Husband       Patient will benefit from skilled therapeutic intervention in order to improve the following deficits and impairments:  Abnormal gait, Decreased balance, Decreased strength, Decreased knowledge of use of DME, Difficulty walking  Visit Diagnosis: Unsteadiness on feet  Muscle weakness (generalized)     Problem List Patient Active Problem List   Diagnosis Date Noted  . Cancer, metastatic to bone ([place]) 06/21/2016  . Pain and swelling of left lower leg 08/25/2015  . Seizure disorder ([place]) 04/06/2015  . Personal history of urinary infection 03/15/2015  . Cerebrovascular accident (CVA) ([place]) 02/18/2015  . Endometrial cancer ([place]) 11/18/2014  . Degeneration of intervertebral disc of lumbosacral region 11/14/2014  . Overflow incontinence 11/14/2014  . Pure hypercholesterolemia 11/14/2014  . Acquired hypothyroidism 10/20/2014  . DDD (degenerative disc disease), lumbosacral 10/20/2014  . Benign neoplasm of colon 10/20/2014  . Acid reflux 10/20/2014  . Esophageal stenosis 10/20/2014  . Bloodgood disease 10/20/2014  . History of colon polyps 10/20/2014  . H/O gastric ulcer 10/20/2014  . Healed or old pulmonary embolism 10/20/2014  . History of urinary anomaly 10/20/2014  . BP (high blood pressure) 10/20/2014  . Hypersomnia with sleep apnea 10/20/2014  . Multinodular goiter 10/20/2014  . Angina  pectoris ([place]) 10/20/2014  . Seizure ([place]) 10/20/2014  . Digestive symptom 10/20/2014  . Incontinence overflow, urine 10/20/2014  . Dupuytren's contracture of foot 10/20/2014  . Hypercholesterolemia without hypertriglyceridemia 10/20/2014  . Cancer of left breast ([place]) 10/20/2014  . History of endometrial cancer 09/30/2014  . Lumbar radiculopathy 09/04/2014  . Deep vein thrombosis ([place]) 07/15/2013  . Deep vein thrombosis (DVT) ([place]) 07/15/2013  . Stroke [facility]) 04/07/2011    Trotter,Margaret PT, DPT 07/10/2017, 11:21 AM  [place] MAIN [facility] SERVICES [address] [place], [place], [address] Phone: [phone]   Fax:  [phone]  Name: MASHAWN BRAZIL MRN: [id number] Date of Birth: 1929/04/24

## 2017-07-12 ENCOUNTER — Ambulatory Visit: Payer: Medicare Other | Admitting: Physical Therapy

## 2017-07-13 ENCOUNTER — Ambulatory Visit: Payer: Medicare Other

## 2017-07-16 ENCOUNTER — Ambulatory Visit: Payer: Medicare Other

## 2017-07-23 ENCOUNTER — Ambulatory Visit: Payer: Medicare Other

## 2017-07-25 ENCOUNTER — Ambulatory Visit: Payer: Medicare Other | Admitting: Physical Therapy

## 2017-07-30 ENCOUNTER — Ambulatory Visit: Payer: Medicare Other | Admitting: Physical Therapy

## 2017-08-02 ENCOUNTER — Ambulatory Visit: Payer: Medicare Other

## 2017-08-07 ENCOUNTER — Other Ambulatory Visit: Payer: Self-pay | Admitting: *Deleted

## 2017-08-07 DIAGNOSIS — C541 Malignant neoplasm of endometrium: Secondary | ICD-10-CM

## 2017-08-07 DIAGNOSIS — C7951 Secondary malignant neoplasm of bone: Secondary | ICD-10-CM

## 2017-08-07 MED ORDER — OXYCODONE-ACETAMINOPHEN 10-325 MG PO TABS: 1.0000 | ORAL_TABLET | Freq: Three times a day (TID) | ORAL | 0 refills | Status: DC | PRN

## 2017-08-22 ENCOUNTER — Other Ambulatory Visit: Payer: Self-pay | Admitting: *Deleted

## 2017-08-22 DIAGNOSIS — C7951 Secondary malignant neoplasm of bone: Secondary | ICD-10-CM

## 2017-08-22 DIAGNOSIS — C541 Malignant neoplasm of endometrium: Secondary | ICD-10-CM

## 2017-08-22 MED ORDER — FENTANYL 75 MCG/HR TD PT72: 75.0000 ug | MEDICATED_PATCH | TRANSDERMAL | 0 refills | Status: DC

## 2017-08-22 NOTE — Telephone Encounter (Signed)
md agreed to fill script

## 2017-08-22 NOTE — Telephone Encounter (Signed)
Patient needs her fentanyl patch refilled. Please call Antony Haste to let him know that it has been done.     dhs

## 2017-09-05 ENCOUNTER — Other Ambulatory Visit: Payer: Self-pay

## 2017-09-05 ENCOUNTER — Inpatient Hospital Stay (HOSPITAL_BASED_OUTPATIENT_CLINIC_OR_DEPARTMENT_OTHER): Payer: Medicare Other | Admitting: Internal Medicine

## 2017-09-05 ENCOUNTER — Encounter: Payer: Self-pay | Admitting: Internal Medicine

## 2017-09-05 ENCOUNTER — Inpatient Hospital Stay: Payer: Medicare Other

## 2017-09-05 ENCOUNTER — Inpatient Hospital Stay: Payer: Medicare Other | Attending: Internal Medicine

## 2017-09-05 VITALS — BP 116/68 | HR 68 | Temp 97.6°F | Wt 113.8 lb

## 2017-09-05 DIAGNOSIS — I82512 Chronic embolism and thrombosis of left femoral vein: Secondary | ICD-10-CM | POA: Insufficient documentation

## 2017-09-05 DIAGNOSIS — M549 Dorsalgia, unspecified: Secondary | ICD-10-CM

## 2017-09-05 DIAGNOSIS — F0390 Unspecified dementia without behavioral disturbance: Secondary | ICD-10-CM

## 2017-09-05 DIAGNOSIS — Z8541 Personal history of malignant neoplasm of cervix uteri: Secondary | ICD-10-CM | POA: Insufficient documentation

## 2017-09-05 DIAGNOSIS — Z90710 Acquired absence of both cervix and uterus: Secondary | ICD-10-CM

## 2017-09-05 DIAGNOSIS — C7951 Secondary malignant neoplasm of bone: Secondary | ICD-10-CM

## 2017-09-05 DIAGNOSIS — R911 Solitary pulmonary nodule: Secondary | ICD-10-CM

## 2017-09-05 DIAGNOSIS — R634 Abnormal weight loss: Secondary | ICD-10-CM | POA: Diagnosis not present

## 2017-09-05 DIAGNOSIS — N183 Chronic kidney disease, stage 3 (moderate): Secondary | ICD-10-CM

## 2017-09-05 DIAGNOSIS — Z803 Family history of malignant neoplasm of breast: Secondary | ICD-10-CM

## 2017-09-05 DIAGNOSIS — C541 Malignant neoplasm of endometrium: Secondary | ICD-10-CM

## 2017-09-05 DIAGNOSIS — Z7901 Long term (current) use of anticoagulants: Secondary | ICD-10-CM | POA: Diagnosis not present

## 2017-09-05 DIAGNOSIS — Z923 Personal history of irradiation: Secondary | ICD-10-CM | POA: Insufficient documentation

## 2017-09-05 DIAGNOSIS — M7989 Other specified soft tissue disorders: Secondary | ICD-10-CM

## 2017-09-05 DIAGNOSIS — R296 Repeated falls: Secondary | ICD-10-CM

## 2017-09-05 DIAGNOSIS — M25551 Pain in right hip: Secondary | ICD-10-CM | POA: Insufficient documentation

## 2017-09-05 LAB — COMPREHENSIVE METABOLIC PANEL
ALBUMIN: 3.6 g/dL (ref 3.5–5.0)
ALK PHOS: 35 U/L — AB (ref 38–126)
ALT: 12 U/L (ref 0–44)
ANION GAP: 9 (ref 5–15)
AST: 23 U/L (ref 15–41)
BUN: 20 mg/dL (ref 8–23)
CALCIUM: 8.9 mg/dL (ref 8.9–10.3)
CO2: 22 mmol/L (ref 22–32)
Chloride: 110 mmol/L (ref 98–111)
Creatinine, Ser: 1.2 mg/dL — ABNORMAL HIGH (ref 0.44–1.00)
GFR calc Af Amer: 46 mL/min — ABNORMAL LOW (ref >60)
GFR calc non Af Amer: 40 mL/min — ABNORMAL LOW (ref >60)
GLUCOSE: 113 mg/dL — AB (ref 70–99)
Potassium: 4 mmol/L (ref 3.5–5.1)
SODIUM: 141 mmol/L (ref 135–145)
Total Bilirubin: 0.7 mg/dL (ref 0.3–1.2)
Total Protein: 6.8 g/dL (ref 6.5–8.1)

## 2017-09-05 LAB — CBC WITH DIFFERENTIAL/PLATELET
BASOS PCT: 1 %
Basophils Absolute: 0 10*3/uL (ref 0–0.1)
Eosinophils Absolute: 0.1 10*3/uL (ref 0–0.7)
Eosinophils Relative: 3 %
HEMATOCRIT: 39.5 % (ref 35.0–47.0)
HEMOGLOBIN: 13.5 g/dL (ref 12.0–16.0)
LYMPHS PCT: 28 %
Lymphs Abs: 1.2 10*3/uL (ref 1.0–3.6)
MCH: 31.6 pg (ref 26.0–34.0)
MCHC: 34.1 g/dL (ref 32.0–36.0)
MCV: 92.6 fL (ref 80.0–100.0)
MONO ABS: 0.4 10*3/uL (ref 0.2–0.9)
Monocytes Relative: 10 %
NEUTROS ABS: 2.4 10*3/uL (ref 1.4–6.5)
Neutrophils Relative %: 58 %
Platelets: 219 10*3/uL (ref 150–440)
RBC: 4.26 MIL/uL (ref 3.80–5.20)
RDW: 13.3 % (ref 11.5–14.5)
WBC: 4.2 10*3/uL (ref 3.6–11.0)

## 2017-09-05 NOTE — Progress Notes (Signed)
Traci [name] OFFICE PROGRESS NOTE  Patient Care Team: Idelle Crouch, MD as PCP - General (Unknown Physician Specialty) [name], MD as Referring Physician (Surgery) [name], MD as Consulting Physician (Urology)  Cancer Staging Cancer of left breast [facility]) Staging form: Breast, AJCC 7th Edition - Clinical: Stage IA (T1b, N0, M0) - Signed by Forest Gleason, MD on 10/20/2014    Oncology History   . Patient has a previous history of endometrium carcinoma of endometrium stage IB disease status post bilateral salpingo-oophorectomy and radiation therapy.  4. Right hydronephrosis detected on MRI scan off lumbosacral spine. Cystoscopy with attempted stent placement revealed bladder tumor in September of 2016 biopsies positive for recurrent endometrial cancer "ENDOMETROID"  5. Patient had stent placement in the right kidney Started radiation therapy 6. Patient has finished radiation therapy with relief in the pain (November, 2016) 7. Started on Megace in December of 2016 8 deep vein thrombosis in the left lower extremity (February 08, 2014) Hematuria due to xeralto IVC filter placement on February 11, 2015 She is off xeralto 9.condition recently had a cystoscopy no evidence of bleeding was found so patient has been started on ELOQUIS (March 15, 2015)  10. Patient again had a significant bleeding with hematuria with eloquis and has been discontinued; Patient is on Plavix. ON  Megace. AUG 2017- PET IMPROVED; mediastinal uptake- monitor for now.   FEB 2017-LEFT BREAST CA [9'0 clock- overlapping site] Estrogen receptor positive. Progesterone receptor positive. HER-2 receptor; Her 2neg; 5 mm tumor clinically stage IB N0 M0 tumor;s/p Lumpectomy; NO RT  # PET NOV 27th- STABLE right iliac lesions [increasing pain]/ right paratrac- refer to RT  # NOV 7th 2018- ? Adrenal insuff; STARTED MIDODRINE  # X-geva q 4 w; Jan 2019- every 8 w  # # August 25 2015- LLE DVT- start Eliquis [stop plavix]; Jan 2019- Eliquis 2.5 mg BID  # MOLECULAR STUDIES: 08/23/15- MMR-STABLE.      Cancer of left breast ([place])   10/20/2014 Initial Diagnosis    Cancer of left breast       Endometrial cancer ([place])      INTERVAL HISTORY: Patient is a poor historian/dementia.  Husband by the patient side.  Traci [name] 84 y.o.  female pleasant patient above history of metastatic endometrial cancer currently on Megace is here for follow-up.   As per the patient's husband notes to have worsening dementia.  Worsening difficulty sleeping worsening memory problems.  Continues to have falls.  Patient complains of pain in her back and also in the right hip.  Review of Systems  Constitutional: Positive for weight loss. Negative for chills, diaphoresis, fever and malaise/fatigue.  HENT: Negative for nosebleeds and sore throat.   Eyes: Negative for double vision.  Respiratory: Negative for cough, hemoptysis, sputum production, shortness of breath and wheezing.   Cardiovascular: Negative for chest pain, palpitations, orthopnea and leg swelling.  Gastrointestinal: Negative for abdominal pain, blood in stool, constipation, diarrhea, heartburn, melena, nausea and vomiting.  Genitourinary: Negative for dysuria, frequency and urgency.  Musculoskeletal: Positive for back pain and joint pain.  Skin: Negative.  Negative for itching and rash.  Neurological: Positive for dizziness. Negative for tingling, focal weakness, weakness and headaches.  Endo/Heme/Allergies: Does not bruise/bleed easily.  Psychiatric/Behavioral: Positive for memory loss. Negative for depression. The patient has insomnia. The patient is not nervous/anxious.       PAST MEDICAL HISTORY :  Past Medical History:  Diagnosis Date  . Arthritis  Degenerative Disc Disease  . Bone cancer ([place])   . Breast cancer ([place]) 2016   LT LUMPECTOMY 03-2015  . Chronic kidney disease    HYDRONEPHROSIS  . Deep vein  blood clot of left lower extremity ([place]) February 11, 2015   Left Leg  . Endometrial adenocarcinoma ([place]) 01/2012    stage Ib, grade 1, positive peritoneal cytology, no other risk factors or extra-uterine disease  . Gastric ulcer   . GERD (gastroesophageal reflux disease)   . Hearing loss   . Hiccups   . History of brachytherapy   . History of colon polyps   . History of DVT (deep vein thrombosis)   . History of esophageal stricture   . History of shingles   . Inguinal lymphocyst   . Mild aphasia   . Pain    SEVERE CHRONIC RIGHT LEG,HIP,FOOT  . PE (pulmonary embolism)    H/O  . Stroke ([place]) 04/06/2011   residual:  aphasia    PAST SURGICAL HISTORY :   Past Surgical History:  Procedure Laterality Date  . ABDOMINAL HYSTERECTOMY  December 2013   history of cervical cancer  . BACK SURGERY     fusion of L3&4  . BREAST BIOPSY Left 10/13/2014   POS  . BREAST LUMPECTOMY Left 03/2015  . CATARACT EXTRACTION W/ INTRAOCULAR LENS IMPLANT     right  . CYSTOSCOPY W/ RETROGRADES Right 10/28/2014   Procedure: CYSTOSCOPY WITH RETROGRADE PYELOGRAM ATTEMPT, UNABLE TO PASS ;  Surgeon: [name], MD;  Location: [facility];  Service: Urology;  Laterality: Right;  . CYSTOSCOPY W/ RETROGRADES Right 09/15/2015   Procedure: CYSTOSCOPY WITH RETROGRADE PYELOGRAM;  Surgeon: [name], MD;  Location: [facility];  Service: Urology;  Laterality: Right;  . CYSTOSCOPY W/ URETERAL STENT PLACEMENT Right 03/10/2015   Procedure: CYSTOSCOPY WITH STENT REPLACEMENT;  Surgeon: [name], MD;  Location: [facility];  Service: Urology;  Laterality: Right;  . CYSTOSCOPY W/ URETERAL STENT PLACEMENT Right 06/09/2015   Procedure: CYSTOSCOPY WITH STENT REPLACEMENT;  Surgeon: [name], MD;  Location: [facility];  Service: Urology;  Laterality: Right;  . CYSTOSCOPY W/ URETERAL STENT PLACEMENT Right 09/15/2015   Procedure: CYSTOSCOPY WITH STENT REPLACEMENT;  Surgeon: [name], MD;  Location: [facility];   Service: Urology;  Laterality: Right;  . CYSTOSCOPY WITH STENT PLACEMENT Right 10/28/2014   Procedure: BLADDER BIOPSY WITH FULGERATION ;  Surgeon: [name], MD;  Location: [facility];  Service: Urology;  Laterality: Right;  . ESOPHAGEAL DILATION    . EYE SURGERY Bilateral    Cataract Extraction  . PARTIAL MASTECTOMY WITH NEEDLE LOCALIZATION Left 03/30/2015   Procedure: PARTIAL MASTECTOMY WITH NEEDLE LOCALIZATION;  Surgeon: [name], MD;  Location: [facility];  Service: General;  Laterality: Left;  . PERIPHERAL VASCULAR CATHETERIZATION N/A 02/11/2015   Procedure: IVC Filter Insertion;  Surgeon: Algernon Huxley, MD;  Location: [place] [facility];  Service: Cardiovascular;  Laterality: N/A;  . PORTACATH PLACEMENT Right 11/26/2014   Procedure: INSERTION PORT-A-CATH;  Surgeon: [name], MD;  Location: [facility];  Service: General;  Laterality: Right;  . SENTINEL NODE BIOPSY Left 03/30/2015   Procedure: SENTINEL NODE BIOPSY;  Surgeon: [name], MD;  Location: [facility];  Service: General;  Laterality: Left;  . thyroid removed    . THYROIDECTOMY, PARTIAL    . TONSILLECTOMY    . TOTAL ABDOMINAL HYSTERECTOMY W/ BILATERAL SALPINGOOPHORECTOMY Bilateral    staging biopsies    FAMILY HISTORY :  Family History  Problem Relation Age of Onset  . Heart attack Mother   . Breast cancer Maternal Aunt        x 2    SOCIAL HISTORY:   Social History   Tobacco Use  . Smoking status: Never Smoker  . Smokeless tobacco: Never Used  Substance Use Topics  . Alcohol use: No    Alcohol/week: 0.0 oz  . Drug use: No    ALLERGIES:  is allergic to penicillins; acetaminophen; hydrocodone-acetaminophen; novocain [procaine]; sulfa antibiotics; and valium [diazepam].  MEDICATIONS:  Current Outpatient Medications  Medication Sig Dispense Refill  . apixaban (ELIQUIS) 2.5 MG TABS tablet Take 1 tablet (2.5 mg total) by mouth 2 (two) times daily. 60 tablet 4  . donepezil (ARICEPT)  10 MG tablet Take 0.5 tablets by mouth 2 (two) times daily.     . fentaNYL (DURAGESIC - DOSED MCG/HR) 75 MCG/HR Place 1 patch (75 mcg total) onto the skin every 3 (three) days. 10 patch 0  . gabapentin (NEURONTIN) 300 MG capsule Take 1 capsule (300 mg total) by mouth 3 (three) times daily. (Patient taking differently: Take 300 mg by mouth 2 (two) times daily. ) 90 capsule 2  . lidocaine-prilocaine (EMLA) cream Apply 1 application topically as needed. 30 g 3  . megestrol (MEGACE) 40 MG tablet TAKE 1 TABLET BY MOUTH 4 TIMES DAILY 120 tablet 2  . midodrine (PROAMATINE) 2.5 MG tablet Take 1 tablet (2.5 mg total) by mouth 3 (three) times daily with meals. 90 tablet 3  . Multiple Vitamins-Minerals (CENTRUM SILVER) tablet Take 1 tablet by mouth daily.    Marland Kitchen oxyCODONE-acetaminophen (PERCOCET) 10-325 MG tablet Take 1 tablet by mouth 3 (three) times daily as needed for pain. 90 tablet 0  . predniSONE (DELTASONE) 20 MG tablet Take 1 tablet (20 mg total) by mouth daily with breakfast. Once a day with food x 10 days. 10 tablet 0   No current facility-administered medications for this visit.     PHYSICAL EXAMINATION: ECOG PERFORMANCE STATUS: 2 - Symptomatic, <50% confined to bed  BP 116/68 (BP Location: Left Arm, Patient Position: Sitting)   Pulse 68   Temp 97.6 F (36.4 C)   Wt 113 lb 12.8 oz (51.6 kg)   BMI 22.23 kg/m   Filed Weights   09/05/17 1052  Weight: 113 lb 12.8 oz (51.6 kg)    GENERAL: Well-nourished well-developed; Alert, no distress and comfortable.  Accompanied by family.  EYES: no pallor or icterus OROPHARYNX: no thrush or ulceration; NECK: supple; no lymph nodes felt. LYMPH:  no palpable lymphadenopathy in the axillary or inguinal regions LUNGS: Decreased breath sounds auscultation bilaterally. No wheeze or crackles HEART/CVS: regular rate & rhythm and no murmurs; No lower extremity edema ABDOMEN:abdomen soft, non-tender and normal bowel sounds. No hepatomegaly or splenomegaly.   Musculoskeletal:no cyanosis of digits and no clubbing  PSYCH: alert & oriented x 3 with fluent speech NEURO: no focal motor/sensory deficits SKIN:  no rashes or significant lesions    LABORATORY DATA:  I have reviewed the data as listed    Component Value Date/Time   NA 141 09/05/2017 1012   NA 139 11/04/2013 1257   K 4.0 09/05/2017 1012   K 4.3 03/05/2014 1156   CL 110 09/05/2017 1012   CL 106 11/04/2013 1257   CO2 22 09/05/2017 1012   CO2 26 11/04/2013 1257   GLUCOSE 113 (H) 09/05/2017 1012   GLUCOSE 96 11/04/2013 1257   BUN 20 09/05/2017 1012  BUN 13 11/04/2013 1257   CREATININE 1.20 (H) 09/05/2017 1012   CREATININE 0.90 11/04/2013 1257   CALCIUM 8.9 09/05/2017 1012   CALCIUM 8.5 11/04/2013 1257   PROT 6.8 09/05/2017 1012   PROT 6.8 11/04/2013 1257   ALBUMIN 3.6 09/05/2017 1012   ALBUMIN 3.5 11/04/2013 1257   AST 23 09/05/2017 1012   AST 25 11/04/2013 1257   ALT 12 09/05/2017 1012   ALT 20 11/04/2013 1257   ALKPHOS 35 (L) 09/05/2017 1012   ALKPHOS 64 11/04/2013 1257   BILITOT 0.7 09/05/2017 1012   BILITOT 0.5 11/04/2013 1257   GFRNONAA 40 (L) 09/05/2017 1012   GFRNONAA >60 11/04/2013 1257   GFRNONAA >60 03/29/2012 1650   GFRAA 46 (L) 09/05/2017 1012   GFRAA >60 11/04/2013 1257   GFRAA >60 03/29/2012 1650    No results found for: SPEP, UPEP  Lab Results  Component Value Date   WBC 4.2 09/05/2017   NEUTROABS 2.4 09/05/2017   HGB 13.5 09/05/2017   HCT 39.5 09/05/2017   MCV 92.6 09/05/2017   PLT 219 09/05/2017      Chemistry      Component Value Date/Time   NA 141 09/05/2017 1012   NA 139 11/04/2013 1257   K 4.0 09/05/2017 1012   K 4.3 03/05/2014 1156   CL 110 09/05/2017 1012   CL 106 11/04/2013 1257   CO2 22 09/05/2017 1012   CO2 26 11/04/2013 1257   BUN 20 09/05/2017 1012   BUN 13 11/04/2013 1257   CREATININE 1.20 (H) 09/05/2017 1012   CREATININE 0.90 11/04/2013 1257      Component Value Date/Time   CALCIUM 8.9 09/05/2017 1012   CALCIUM  8.5 11/04/2013 1257   ALKPHOS 35 (L) 09/05/2017 1012   ALKPHOS 64 11/04/2013 1257   AST 23 09/05/2017 1012   AST 25 11/04/2013 1257   ALT 12 09/05/2017 1012   ALT 20 11/04/2013 1257   BILITOT 0.7 09/05/2017 1012   BILITOT 0.5 11/04/2013 1257       RADIOGRAPHIC STUDIES: I have personally reviewed the radiological images as listed and agreed with the findings in the report. No results found.   ASSESSMENT & PLAN:  Endometrial cancer ([place]) # Endometrial cancer ER PR positive; stage IV-right iliac bone.  Clinically worse [see discussion below] continue Megace.   # Right hip pain- WORSE? metastatic disease in the right pelvic bone; as March 2019 PET scan was not avid. Recommend MRI of lumbar spine/pelvic. Continue on fentanyl patch to 75/ and percocet 10 every 8-12 hours.  #Left upper lobe lung nodule-question mucoid impactions clinically stable.  #History of falls likely secondary to dementia/gait instability.  Worse.  Question worsening dementia.  # Left lower extremity swelling chronic/DVT-stable.  Continue Eliquis; but at-2.5 mg twice daily-falls.   # CKD- III- creat 1.2; recommend increasing fluids.   # weight loss-worsening multifactorial/dementia  #Metastatic lesions to the bone; continue Xgeva every 3 months; hold infusion today.  #Long discussion the patient has been regarding overall poor prognosis; this patient the context of her worsening dementia/multiple falls.  Recommend palliative care evaluation.  # await MRI scans; palliative   Orders Placed This Encounter  Procedures  . MR Lumbar Spine W Wo Contrast    Standing Status:   Future    Number of Occurrences:   1    Standing Expiration Date:   09/05/2018    Order Specific Question:   ** REASON FOR EXAM (FREE TEXT)    Answer:  endometrial cancer    Order Specific Question:   If indicated for the ordered procedure, I authorize the administration of contrast media per Radiology protocol    Answer:   Yes    Order  Specific Question:   What is the patient's sedation requirement?    Answer:   No Sedation    Order Specific Question:   Does the patient have a pacemaker or implanted devices?    Answer:   No    Order Specific Question:   Use SRS Protocol?    Answer:   No    Order Specific Question:   Radiology Contrast Protocol - do NOT remove file path    Answer:   \\[email or web address]    Order Specific Question:   Preferred imaging location?    Answer:   [facility] (table limit-350lbs)  . MR PELVIS W WO CONTRAST    Standing Status:   Future    Number of Occurrences:   1    Standing Expiration Date:   11/06/2018    Order Specific Question:   ** REASON FOR EXAM (FREE TEXT)    Answer:   endometrail cancer; wosening hip pain    Order Specific Question:   If indicated for the ordered procedure, I authorize the administration of contrast media per Radiology protocol    Answer:   Yes    Order Specific Question:   What is the patient's sedation requirement?    Answer:   No Sedation    Order Specific Question:   Does the patient have a pacemaker or implanted devices?    Answer:   No    Order Specific Question:   Radiology Contrast Protocol - do NOT remove file path    Answer:   \\[email or web address]    Order Specific Question:   Preferred imaging location?    Answer:   [facility] (table limit-350lbs)   All questions were answered. The patient knows to call the clinic with any problems, questions or concerns.      Cammie Sickle, MD 09/11/2017 3:27 PM

## 2017-09-05 NOTE — Assessment & Plan Note (Addendum)
#   Endometrial cancer ER PR positive; stage IV-right iliac bone.  Clinically worse [see discussion below] continue Megace.   # Right hip pain- WORSE? metastatic disease in the right pelvic bone; as March 2019 PET scan was not avid. Recommend MRI of lumbar spine/pelvic. Continue on fentanyl patch to 75/ and percocet 10 every 8-12 hours.  #Left upper lobe lung nodule-question mucoid impactions clinically stable.  #History of falls likely secondary to dementia/gait instability.  Worse.  Question worsening dementia.  # Left lower extremity swelling chronic/DVT-stable.  Continue Eliquis; but at-2.5 mg twice daily-falls.   # CKD- III- creat 1.2; recommend increasing fluids.   # weight loss-worsening multifactorial/dementia  #Metastatic lesions to the bone; continue Xgeva every 3 months; hold infusion today.  #Long discussion the patient has been regarding overall poor prognosis; this patient the context of her worsening dementia/multiple falls.  Recommend palliative care evaluation.  # await MRI scans; palliative

## 2017-09-09 ENCOUNTER — Ambulatory Visit
Admission: RE | Admit: 2017-09-09 | Discharge: 2017-09-09 | Disposition: A | Discharge status: Home / Self Care | Payer: Medicare Other | Source: Ambulatory Visit | Attending: Internal Medicine | Admitting: Internal Medicine

## 2017-09-09 DIAGNOSIS — M25559 Pain in unspecified hip: Secondary | ICD-10-CM | POA: Insufficient documentation

## 2017-09-09 DIAGNOSIS — M899 Disorder of bone, unspecified: Secondary | ICD-10-CM | POA: Diagnosis not present

## 2017-09-09 DIAGNOSIS — M419 Scoliosis, unspecified: Secondary | ICD-10-CM | POA: Insufficient documentation

## 2017-09-09 DIAGNOSIS — M5136 Other intervertebral disc degeneration, lumbar region: Secondary | ICD-10-CM | POA: Diagnosis not present

## 2017-09-09 DIAGNOSIS — K5730 Diverticulosis of large intestine without perforation or abscess without bleeding: Secondary | ICD-10-CM | POA: Insufficient documentation

## 2017-09-09 DIAGNOSIS — C541 Malignant neoplasm of endometrium: Secondary | ICD-10-CM | POA: Diagnosis not present

## 2017-09-09 DIAGNOSIS — N329 Bladder disorder, unspecified: Secondary | ICD-10-CM | POA: Insufficient documentation

## 2017-09-09 MED ORDER — GADOBENATE DIMEGLUMINE 529 MG/ML IV SOLN: 10.0000 mL | Freq: Once | INTRAVENOUS | Status: DC | PRN

## 2017-09-09 MED ORDER — GADOBENATE DIMEGLUMINE 529 MG/ML IV SOLN
10.0000 mL | Freq: Once | INTRAVENOUS | Status: AC | PRN
  Administered 2017-09-09: 10 mL via INTRAVENOUS

## 2017-09-11 ENCOUNTER — Telehealth: Payer: Self-pay | Admitting: Internal Medicine

## 2017-09-11 ENCOUNTER — Telehealth: Payer: Self-pay | Admitting: *Deleted

## 2017-09-11 NOTE — Telephone Encounter (Signed)
Husband called asking if patient needs to come in regarding results of her MRI. If so, requests Thursday around 10. She currently does not have a FOLLOW UP appointment   IMPRESSION: 1. Levoconvex lumbar scoliosis with degenerative findings in the lumbar spine causing borderline impingement at L2-3, L4-5, and L5-S1. 2. Lytic expansile 4.2 by 4.3 by 4.1 cm lesion of the right iliac bone with internal cystic components with enhancing margins. The lesion is roughly similar in size back through 2017. There is some adjacent edema tracking along the deep surface of the right iliacus muscle which is likewise probably chronic. This appearance may reflect a previously treated metastatic lesion, correlate with patient history. No new osseous metastatic lesions are identified. 3. Low-level edema in the hip adductor musculature bilaterally, along the operator internus muscles bilaterally, and in the right gluteus minimus muscle. There is also subcutaneous edema and a small amount of free pelvic fluid. The appearance suggests mild third spacing of fluid. 4. Sigmoid colon diverticulosis. 5. Mild urinary bladder wall thickening could be from nondistention, cystitis is not completely excluded.   Electronically Signed   By: Van Clines M.D.   On: 09/10/2017 08:22

## 2017-09-11 NOTE — Telephone Encounter (Signed)
FYI- left message for pt's husband to call us back to discuss the results of the MRI scan.   Recommend palliative care evaluation/referral  Please schedule a follow up  With MD 1 month/ no labs.

## 2017-09-18 ENCOUNTER — Other Ambulatory Visit: Payer: Self-pay | Admitting: General Surgery

## 2017-09-18 DIAGNOSIS — Z853 Personal history of malignant neoplasm of breast: Secondary | ICD-10-CM

## 2017-09-20 ENCOUNTER — Ambulatory Visit: Payer: Medicare Other

## 2017-09-20 ENCOUNTER — Other Ambulatory Visit: Payer: Medicare Other

## 2017-09-26 ENCOUNTER — Other Ambulatory Visit: Payer: Self-pay | Admitting: Internal Medicine

## 2017-09-26 DIAGNOSIS — M7989 Other specified soft tissue disorders: Secondary | ICD-10-CM

## 2017-09-26 DIAGNOSIS — M79662 Pain in left lower leg: Secondary | ICD-10-CM

## 2017-09-26 DIAGNOSIS — C541 Malignant neoplasm of endometrium: Secondary | ICD-10-CM

## 2017-09-27 ENCOUNTER — Inpatient Hospital Stay: Payer: Medicare Other | Admitting: Internal Medicine

## 2017-10-04 ENCOUNTER — Other Ambulatory Visit: Payer: Self-pay | Admitting: *Deleted

## 2017-10-04 DIAGNOSIS — C7951 Secondary malignant neoplasm of bone: Secondary | ICD-10-CM

## 2017-10-04 DIAGNOSIS — C541 Malignant neoplasm of endometrium: Secondary | ICD-10-CM

## 2017-10-04 MED ORDER — OXYCODONE-ACETAMINOPHEN 10-325 MG PO TABS: 1.0000 | ORAL_TABLET | Freq: Three times a day (TID) | ORAL | 0 refills | Status: DC | PRN

## 2017-10-11 ENCOUNTER — Ambulatory Visit: Payer: Medicare Other | Admitting: Internal Medicine

## 2017-10-12 ENCOUNTER — Telehealth: Payer: Self-pay | Admitting: *Deleted

## 2017-10-12 NOTE — Telephone Encounter (Signed)
Per Dr Rogue Bussing, he signed orders. Christy iunformed at [facility]

## 2017-10-12 NOTE — Telephone Encounter (Signed)
Hospice orders faxed 

## 2017-10-12 NOTE — Telephone Encounter (Signed)
Palliative Care NP recommends Hospice referral. Please call or fax order if in agreement

## 2017-10-18 ENCOUNTER — Other Ambulatory Visit: Payer: Medicare Other

## 2017-10-25 ENCOUNTER — Ambulatory Visit
Admission: RE | Admit: 2017-10-25 | Discharge: 2017-10-25 | Disposition: A | Discharge status: Home / Self Care | Payer: Medicare Other | Source: Ambulatory Visit | Attending: General Surgery | Admitting: General Surgery

## 2017-10-25 DIAGNOSIS — Z853 Personal history of malignant neoplasm of breast: Secondary | ICD-10-CM

## 2017-11-05 ENCOUNTER — Other Ambulatory Visit: Payer: Self-pay | Admitting: *Deleted

## 2017-11-05 ENCOUNTER — Telehealth: Payer: Self-pay | Admitting: *Deleted

## 2017-11-05 DIAGNOSIS — C541 Malignant neoplasm of endometrium: Secondary | ICD-10-CM

## 2017-11-05 DIAGNOSIS — C7951 Secondary malignant neoplasm of bone: Secondary | ICD-10-CM

## 2017-11-05 MED ORDER — OXYCODONE-ACETAMINOPHEN 10-325 MG PO TABS: 1.0000 | ORAL_TABLET | Freq: Three times a day (TID) | ORAL | 0 refills | Status: DC | PRN

## 2017-11-05 NOTE — Telephone Encounter (Signed)
Son wanted to wait when referral made and is now ready to proceed with Hospice, Hospice needs an updated order from Dr B

## 2017-11-06 NOTE — Telephone Encounter (Signed)
Hospice form has been faxed. 

## 2017-11-09 ENCOUNTER — Other Ambulatory Visit: Payer: Self-pay | Admitting: Oncology

## 2017-11-09 ENCOUNTER — Telehealth: Payer: Self-pay | Admitting: *Deleted

## 2017-11-09 MED ORDER — FENTANYL 25 MCG/HR TD PT72: 25.0000 ug | MEDICATED_PATCH | TRANSDERMAL | 0 refills | Status: DC

## 2017-11-09 NOTE — Telephone Encounter (Signed)
Traci Harp, NP added Fentanyl 25 mcg to her current fentanyl dose. Traci [name] informed

## 2017-11-09 NOTE — Telephone Encounter (Signed)
Patient pain not controlled with Fentanyl 100 mcg and Oxycodone 10/325mg . Asking for and increase in pain medicine. Please advise

## 2017-11-12 ENCOUNTER — Telehealth: Payer: Self-pay | Admitting: *Deleted

## 2017-11-12 NOTE — Telephone Encounter (Signed)
Called back to hospice nurse regarding the fact that patient got #90 tabs 1 week ago and should not be out of medicine at this time. Will await return call

## 2017-11-12 NOTE — Telephone Encounter (Signed)
Discussed with Otila Kluver and she is trying to get in touch with pharmacy to see how many were dispensed.

## 2017-11-12 NOTE — Telephone Encounter (Signed)
Patient called [facility] requesting refill of Percocet.   As mandated by the [place] STOP Act (Strengthen Opioid Misuse Prevention), the [place] Controlled Substance Reporting System ([place]) was reviewed for this patient.    Per [place], patient filled prescription for Percocet on 11/05/17 for 90 tablets which is 30 day supply. Refill not appropriate at this time.     Please contact Hospice to determine status of medications including fentanyl, percocet, and valium. Could consider having one provider/clinic write for her medications.   Beckey Rutter, DNP, AGNP-C [place] at [facility] [phone] (work cell) [phone] (office)

## 2017-11-13 NOTE — Telephone Encounter (Signed)
I spoke with Traci [name] who states she will follow up with patient case manager who is supposed to visit patient today

## 2017-11-29 ENCOUNTER — Other Ambulatory Visit: Payer: Self-pay | Admitting: Internal Medicine

## 2017-11-29 ENCOUNTER — Other Ambulatory Visit: Payer: Self-pay | Admitting: *Deleted

## 2017-11-29 NOTE — Telephone Encounter (Signed)
Informed hospice too early for refill Spoke with Traci [name]

## 2017-12-05 ENCOUNTER — Other Ambulatory Visit: Payer: Self-pay | Admitting: Nurse Practitioner

## 2017-12-05 ENCOUNTER — Other Ambulatory Visit: Payer: Self-pay | Admitting: *Deleted

## 2017-12-05 DIAGNOSIS — C541 Malignant neoplasm of endometrium: Secondary | ICD-10-CM

## 2017-12-05 DIAGNOSIS — C7951 Secondary malignant neoplasm of bone: Secondary | ICD-10-CM

## 2017-12-05 MED ORDER — FENTANYL 75 MCG/HR TD PT72: 75.0000 ug | MEDICATED_PATCH | TRANSDERMAL | 0 refills | Status: DC

## 2017-12-05 MED ORDER — FENTANYL 25 MCG/HR TD PT72: 25.0000 ug | MEDICATED_PATCH | TRANSDERMAL | 0 refills | Status: DC

## 2017-12-05 MED ORDER — FENTANYL 100 MCG/HR TD PT72: 100.0000 ug | MEDICATED_PATCH | TRANSDERMAL | 0 refills | Status: DC

## 2017-12-05 MED ORDER — OXYCODONE-ACETAMINOPHEN 10-325 MG PO TABS: 1.0000 | ORAL_TABLET | Freq: Three times a day (TID) | ORAL | 0 refills | Status: DC | PRN

## 2017-12-05 MED ORDER — MORPHINE SULFATE (CONCENTRATE) 20 MG/ML PO SOLN: 5.0000 mg | ORAL | 0 refills | Status: DC | PRN

## 2017-12-05 NOTE — Telephone Encounter (Signed)
Hospice called for refill of pain medications and also requests Roxanol for night time use when her pain is uncontrolled

## 2017-12-17 IMAGING — CT CT HEAD W/O CM
3 series · 15 of 47 positions shown, 18 images · non-contrast
Comparison: Brain MRI 05/30/2016.  Head CT 11/04/2013.

CLINICAL DATA: 87-year-old female with dizziness for 6 days.
Personal history of endometrial cancer.

EXAM:
CT HEAD WITHOUT CONTRAST
TECHNIQUE: Contiguous axial images were obtained from the base of the skull
through the vertex without intravenous contrast.

[Series 2: head wo · axial · 0.47mm/px · z∈[-125,+0]mm · 9 of 30 slices shown, 12 images]
[im 3/30  brain]
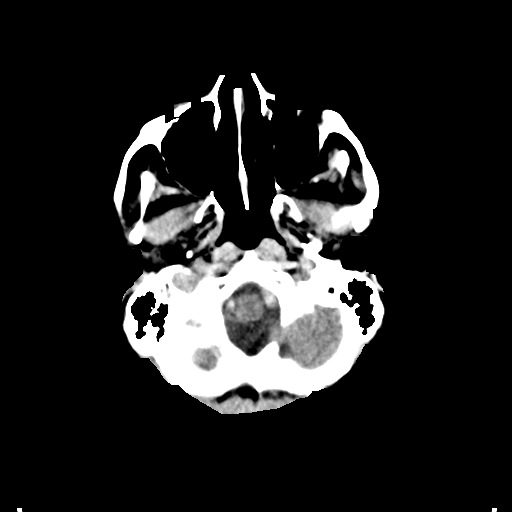
[im 3/30  bone]
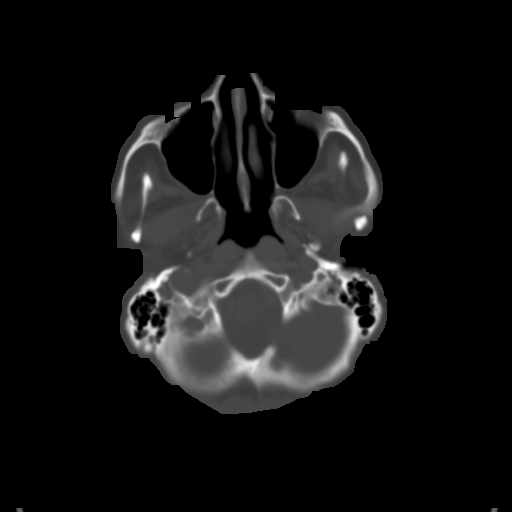
[im 6/30  brain]
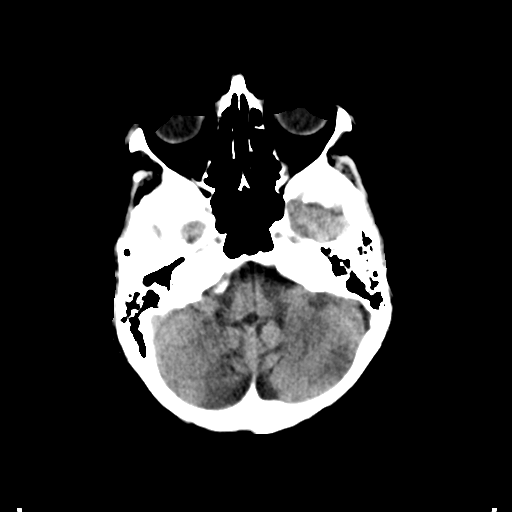
[im 9/30  brain]
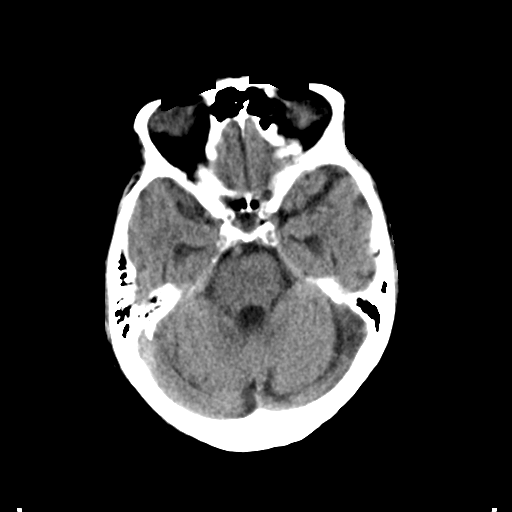
[im 12/30  brain]
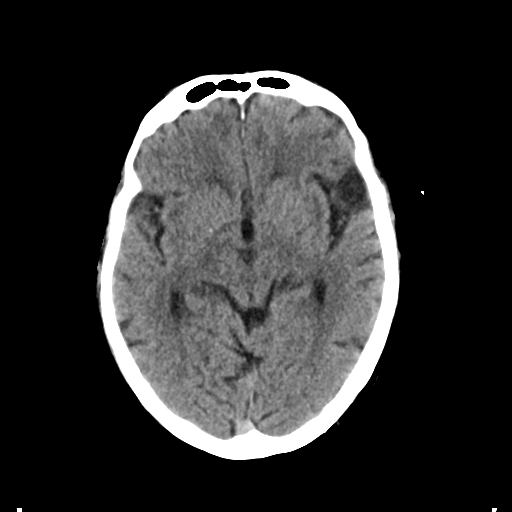
[im 16/30  brain]
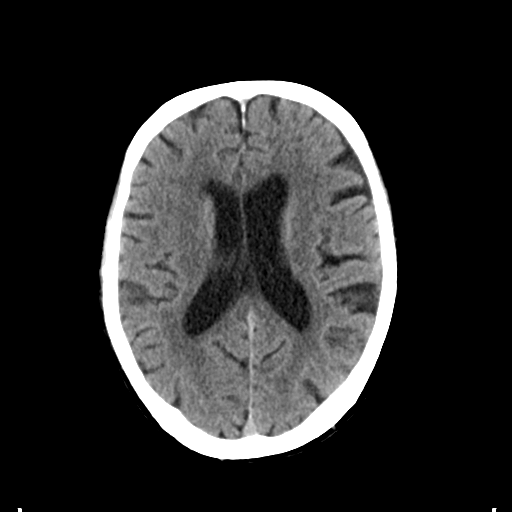
[im 16/30  bone]
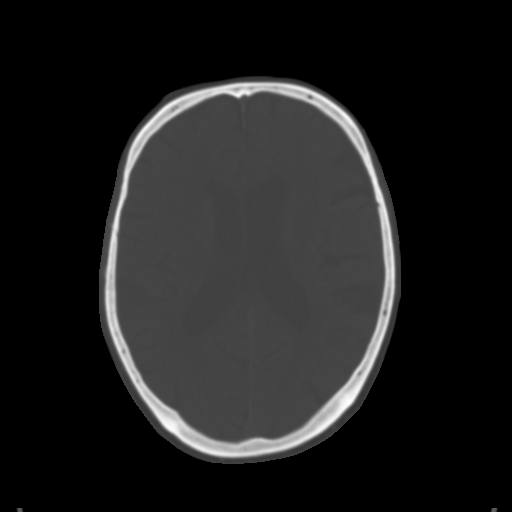
[im 19/30  brain]
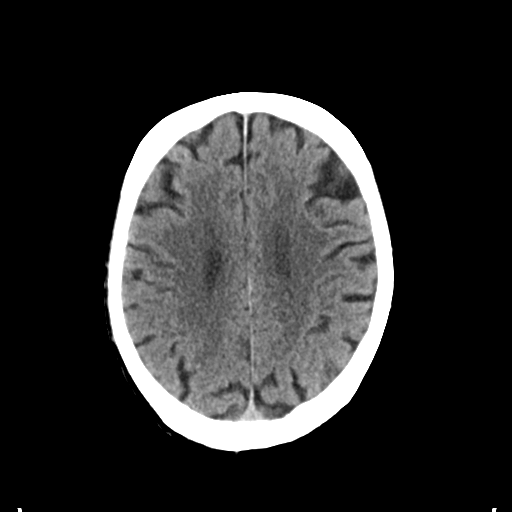
[im 22/30  brain]
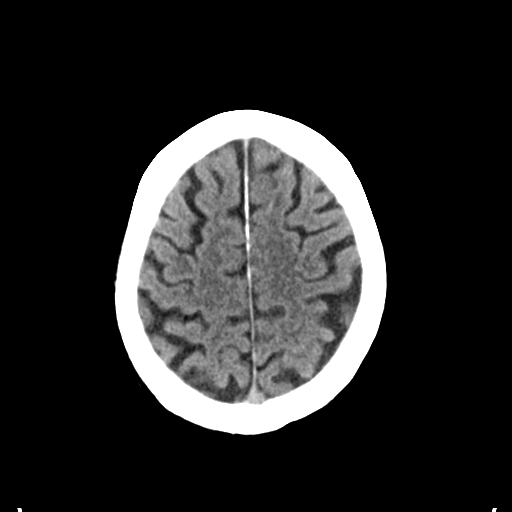
[im 25/30  brain]
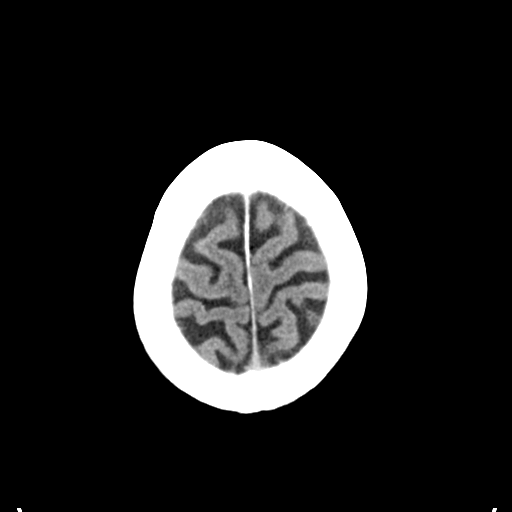
[im 28/30  brain]
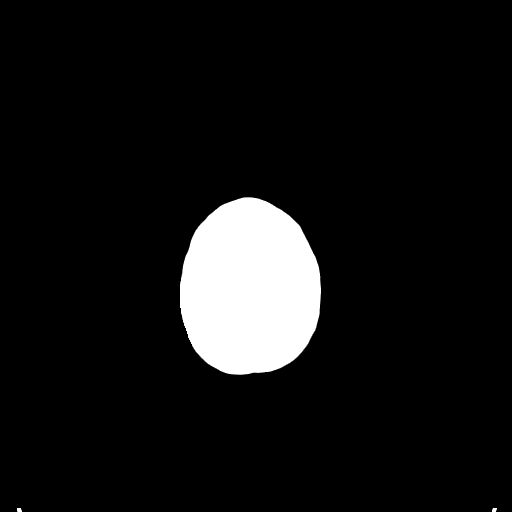
[im 28/30  bone]
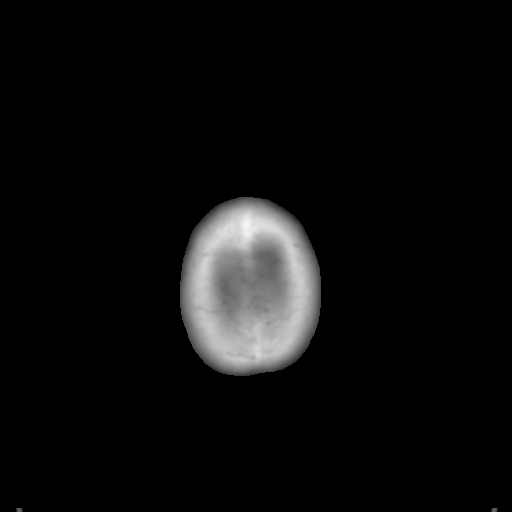

[Series 4: coronal soft tissue · coronal · 0.28mm/px · 3 of 61 slices shown]
[im 21/61  brain]
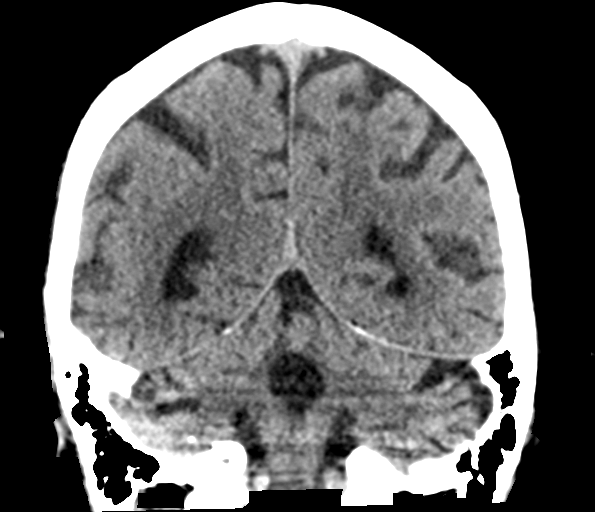
[im 27/61  brain]
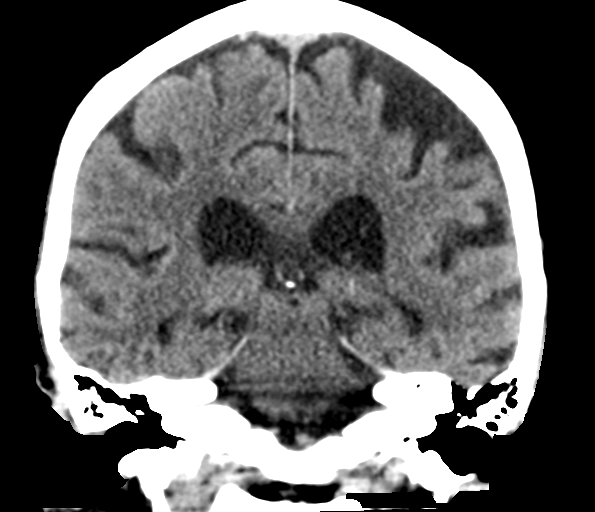
[im 34/61  brain]
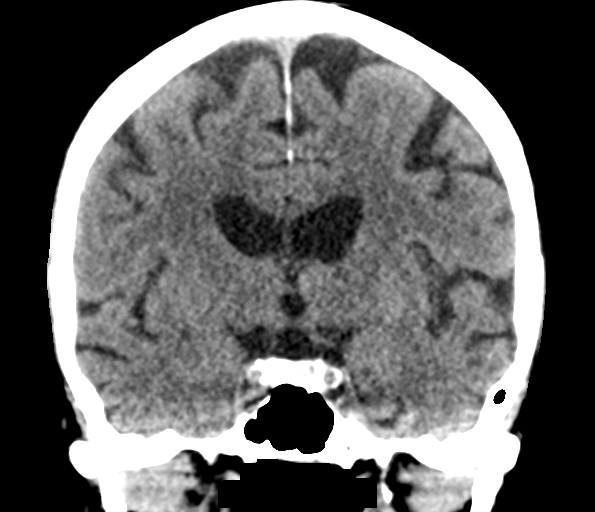

[Series 5: sagittal soft tissue · sagittal · 0.30mm/px · 3 of 48 slices shown]
[im 16/48  brain]
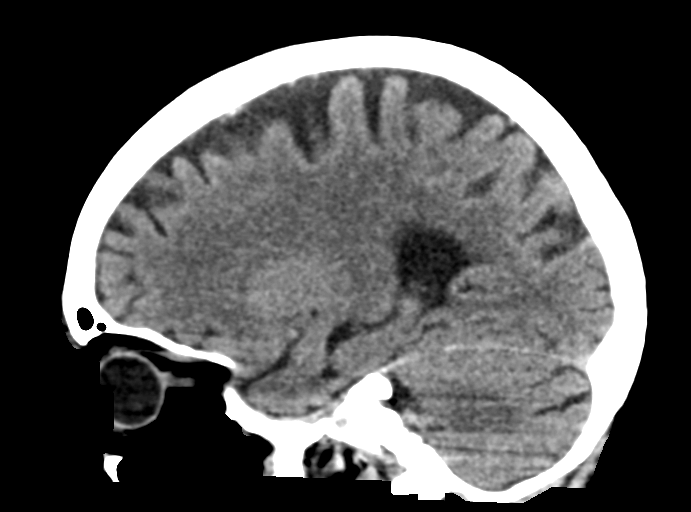
[im 24/48  brain]
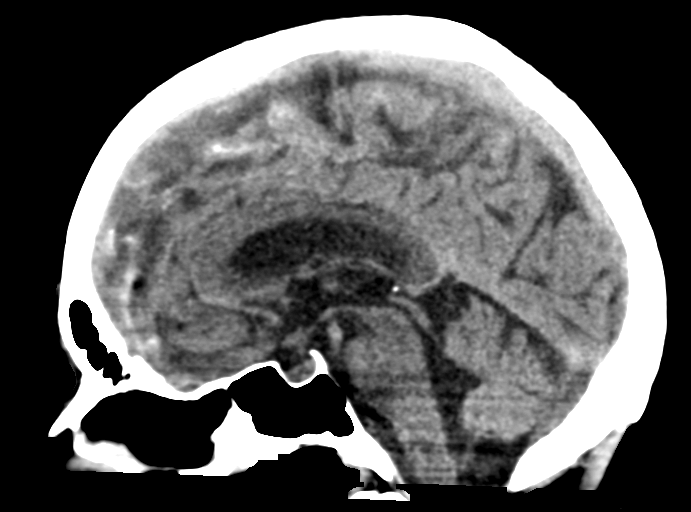
[im 32/48  brain]
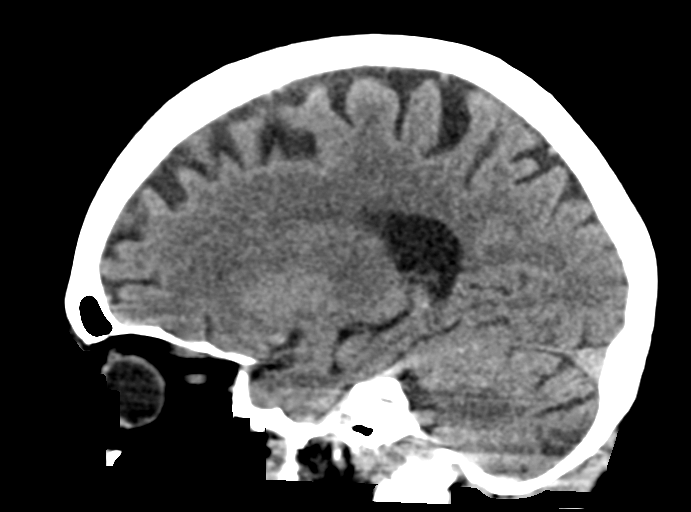

[15 of 47 positions shown; findings below may reference images not displayed]

FINDINGS: Brain: Chronic lacunar infarct of the medial left thalamus appears
stable. Combined patchy in subtle chronic infarcts of the right
posterior and inferior cerebellum ir stable since [place]. Gray-white
matter differentiation elsewhere remains normal for age.

No midline shift, ventriculomegaly, mass effect, evidence of mass
lesion, intracranial hemorrhage or evidence of cortically based
acute infarction.

Vascular: Calcified atherosclerosis at the skull base. No suspicious
intracranial vascular hyperdensity.

Skull: Stable and negative.

Sinuses/Orbits: Visualized paranasal sinuses and mastoids are stable
and well pneumatized. Bilateral tympanic cavities are clear.

Other: No acute orbit or scalp soft tissue findings.
IMPRESSION: 1. No acute intracranial abnormality.
2. Stable compared to the MRI in [place] this year, including chronic
infarcts in the left thalamus and right cerebellum.
These results will be called to the ordering clinician or
representative by the [HOSPITAL] at the imaging location.

## 2018-01-01 ENCOUNTER — Other Ambulatory Visit: Payer: Self-pay | Admitting: *Deleted

## 2018-01-01 DIAGNOSIS — C7951 Secondary malignant neoplasm of bone: Secondary | ICD-10-CM

## 2018-01-01 DIAGNOSIS — C541 Malignant neoplasm of endometrium: Secondary | ICD-10-CM

## 2018-01-01 MED ORDER — OXYCODONE-ACETAMINOPHEN 10-325 MG PO TABS: 1.0000 | ORAL_TABLET | Freq: Three times a day (TID) | ORAL | 0 refills | Status: DC | PRN

## 2018-01-01 MED ORDER — FENTANYL 100 MCG/HR TD PT72: 100.0000 ug | MEDICATED_PATCH | TRANSDERMAL | 0 refills | Status: DC

## 2018-01-01 MED ORDER — MORPHINE SULFATE (CONCENTRATE) 20 MG/ML PO SOLN: 5.0000 mg | ORAL | 0 refills | Status: AC | PRN

## 2018-01-01 MED ORDER — FENTANYL 25 MCG/HR TD PT72: 25.0000 ug | MEDICATED_PATCH | TRANSDERMAL | 0 refills | Status: DC

## 2018-01-01 NOTE — Telephone Encounter (Signed)
Patient called cancer center requesting refill of  Fentanyl 125 mcg/hr, Percocet 10-325 TID and Roxanol 20 mg/ml q 1 hours PRN.   As mandated by the [place] STOP Act (Strengthen Opioid Misuse Prevention), the [place] Controlled Substance Reporting System ([place]) was reviewed for this patient. Below is the past 16-months of controlled substance prescriptions as displayed by the registry. I have personally consulted with my supervising physician, Dr. Rogue Bussing, who agrees that continuation of opiate therapy is medically appropriate at this time and agrees to provide continual monitoring, including urine/blood drug screens, as indicated. Fentanyl refill is appropriate on or after 12/19/17. Percocet refill is appropriate on or after 12/18/17. Roxanol refill is appropriate on or after 12/08/17.   [facility] reviewed:     Traci Casa, NP 01/01/2018 10:03 AM [phone]

## 2018-01-12 ENCOUNTER — Emergency Department
Admission: EM | Admit: 2018-01-12 | Discharge: 2018-01-12 | Disposition: A | Discharge status: Home / Self Care | Attending: Emergency Medicine | Admitting: Emergency Medicine

## 2018-01-12 ENCOUNTER — Emergency Department

## 2018-01-12 ENCOUNTER — Encounter: Payer: Self-pay | Admitting: Emergency Medicine

## 2018-01-12 DIAGNOSIS — W19XXXA Unspecified fall, initial encounter: Secondary | ICD-10-CM

## 2018-01-12 DIAGNOSIS — Y939 Activity, unspecified: Secondary | ICD-10-CM | POA: Insufficient documentation

## 2018-01-12 DIAGNOSIS — Y999 Unspecified external cause status: Secondary | ICD-10-CM | POA: Insufficient documentation

## 2018-01-12 DIAGNOSIS — W1830XA Fall on same level, unspecified, initial encounter: Secondary | ICD-10-CM | POA: Insufficient documentation

## 2018-01-12 DIAGNOSIS — S0101XA Laceration without foreign body of scalp, initial encounter: Secondary | ICD-10-CM | POA: Insufficient documentation

## 2018-01-12 DIAGNOSIS — Y929 Unspecified place or not applicable: Secondary | ICD-10-CM | POA: Diagnosis not present

## 2018-01-12 DIAGNOSIS — S0990XA Unspecified injury of head, initial encounter: Secondary | ICD-10-CM

## 2018-01-12 NOTE — ED Triage Notes (Addendum)
Patient presents to the ED with a long laceration to the back of her head post fall today.  Patient's husband states this is the second fall today and patient fell yesterday evening as well.  Patient is a hospice patient and husband states hospice nurse encouraged them to come to the ED.  Patient also has a bruise to the left side of her face.  Husband states patient did not pass out, falls have been caused by trips.  Patient states she was trying to water flowers but then when she tried to water them she slipped and fell.  Patient is on blood thinners.

## 2018-01-12 NOTE — ED Provider Notes (Signed)
[facility] Emergency Department Provider Note       Time seen: ----------------------------------------- 6:36 PM on 01/12/2018 -----------------------------------------   I have reviewed the triage vital signs and the nursing notes.  HISTORY   Chief Complaint Fall    HPI Traci [name] is a 84 y.o. female with a history of degenerative disc disease, bone cancer, breast cancer, endometrial adenocarcinoma, gastric ulcer, GERD, PE who presents to the ED for scalp laceration.  Patient presents to the ER after a fall today.  Husband states this is the second fall today and she is falling because of her dementia.  She is currently on hospice care, hospice nurse encouraged him to come to the ER for evaluation.  She was also noted to have bruising around the left eye.  Patient denies any other complaints at this time.  Past Medical History:  Diagnosis Date  . Arthritis    Degenerative Disc Disease  . Bone cancer ([place])   . Breast cancer ([place]) 2016   LT LUMPECTOMY 03-2015  . Chronic kidney disease    HYDRONEPHROSIS  . Deep vein blood clot of left lower extremity ([place]) February 11, 2015   Left Leg  . Endometrial adenocarcinoma ([place]) 01/2012    stage Ib, grade 1, positive peritoneal cytology, no other risk factors or extra-uterine disease  . Gastric ulcer   . GERD (gastroesophageal reflux disease)   . Hearing loss   . Hiccups   . History of brachytherapy   . History of colon polyps   . History of DVT (deep vein thrombosis)   . History of esophageal stricture   . History of shingles   . Inguinal lymphocyst   . Mild aphasia   . Pain    SEVERE CHRONIC RIGHT LEG,HIP,FOOT  . PE (pulmonary embolism)    H/O  . Stroke [facility]) 04/06/2011   residual:  aphasia    Patient Active Problem List   Diagnosis Date Noted  . Cancer, metastatic to bone ([place]) 06/21/2016  . Pain and swelling of left lower leg 08/25/2015  . Seizure disorder ([place]) 04/06/2015  . Personal  history of urinary infection 03/15/2015  . Cerebrovascular accident (CVA) ([place]) 02/18/2015  . Endometrial cancer ([place]) 11/18/2014  . Degeneration of intervertebral disc of lumbosacral region 11/14/2014  . Overflow incontinence 11/14/2014  . Pure hypercholesterolemia 11/14/2014  . Acquired hypothyroidism 10/20/2014  . DDD (degenerative disc disease), lumbosacral 10/20/2014  . Benign neoplasm of colon 10/20/2014  . Acid reflux 10/20/2014  . Esophageal stenosis 10/20/2014  . Bloodgood disease 10/20/2014  . History of colon polyps 10/20/2014  . H/O gastric ulcer 10/20/2014  . Healed or old pulmonary embolism 10/20/2014  . History of urinary anomaly 10/20/2014  . BP (high blood pressure) 10/20/2014  . Hypersomnia with sleep apnea 10/20/2014  . Multinodular goiter 10/20/2014  . Angina pectoris ([place]) 10/20/2014  . Seizure ([place]) 10/20/2014  . Digestive symptom 10/20/2014  . Incontinence overflow, urine 10/20/2014  . Dupuytren's contracture of foot 10/20/2014  . Hypercholesterolemia without hypertriglyceridemia 10/20/2014  . Cancer of left breast ([place]) 10/20/2014  . History of endometrial cancer 09/30/2014  . Lumbar radiculopathy 09/04/2014  . Deep vein thrombosis ([place]) 07/15/2013  . Deep vein thrombosis (DVT) ([place]) 07/15/2013  . Stroke [facility]) 04/07/2011    Past Surgical History:  Procedure Laterality Date  . ABDOMINAL HYSTERECTOMY  December 2013   history of cervical cancer  . BACK SURGERY     fusion of L3&4  . BREAST BIOPSY Left 10/13/2014  [facility]  . BREAST LUMPECTOMY Left 03/30/2015   invasive mammary carcinoma with clear margins and negative LN  . CATARACT EXTRACTION W/ INTRAOCULAR LENS IMPLANT     right  . CYSTOSCOPY W/ RETROGRADES Right 10/28/2014   Procedure: CYSTOSCOPY WITH RETROGRADE PYELOGRAM ATTEMPT, UNABLE TO PASS ;  Surgeon: [name], MD;  Location: [facility] ORS;  Service: Urology;  Laterality: Right;  . CYSTOSCOPY W/ RETROGRADES Right 09/15/2015   Procedure:  CYSTOSCOPY WITH RETROGRADE PYELOGRAM;  Surgeon: [name], MD;  Location: [facility] ORS;  Service: Urology;  Laterality: Right;  . CYSTOSCOPY W/ URETERAL STENT PLACEMENT Right 03/10/2015   Procedure: CYSTOSCOPY WITH STENT REPLACEMENT;  Surgeon: [name], MD;  Location: [facility] ORS;  Service: Urology;  Laterality: Right;  . CYSTOSCOPY W/ URETERAL STENT PLACEMENT Right 06/09/2015   Procedure: CYSTOSCOPY WITH STENT REPLACEMENT;  Surgeon: [name], MD;  Location: [facility] ORS;  Service: Urology;  Laterality: Right;  . CYSTOSCOPY W/ URETERAL STENT PLACEMENT Right 09/15/2015   Procedure: CYSTOSCOPY WITH STENT REPLACEMENT;  Surgeon: [name], MD;  Location: [facility] OR[facility];  Service: Urology;  Laterality: Right;  . CYSTOSCOPY WITH STENT PLACEMENT Right 10/28/2014   Procedure: BLADDER BIOPSY WITH FULGERATION ;  Surgeon: [name], MD;  Location: [facility] ORS;  Service: Urology;  Laterality: Right;  . ESOPHAGEAL DILATION    . EYE SURGERY Bilateral    Cataract Extraction  . PARTIAL MASTECTOMY WITH NEEDLE LOCALIZATION Left 03/30/2015   Procedure: PARTIAL MASTECTOMY WITH NEEDLE LOCALIZATION;  Surgeon: [name], MD;  Location: [facility] ORS;  Service: General;  Laterality: Left;  . PERIPHERAL VASCULAR CATHETERIZATION N/A 02/11/2015   Procedure: IVC Filter Insertion;  Surgeon: Algernon Huxley, MD;  Location: [place] CV [facility];  Service: Cardiovascular;  Laterality: N/A;  . PORTACATH PLACEMENT Right 11/26/2014   Procedure: INSERTION PORT-A-CATH;  Surgeon: [name], MD;  Location: [facility] OR[facility];  Service: General;  Laterality: Right;  . SENTINEL NODE BIOPSY Left 03/30/2015   Procedure: SENTINEL NODE BIOPSY;  Surgeon: [name], MD;  Location: [facility] OR[facility];  Service: General;  Laterality: Left;  . thyroid removed    . THYROIDECTOMY, PARTIAL    . TONSILLECTOMY    . TOTAL ABDOMINAL HYSTERECTOMY W/ BILATERAL SALPINGOOPHORECTOMY Bilateral    staging biopsies     Allergies Penicillins; Acetaminophen; Hydrocodone-acetaminophen; Novocain [procaine]; Sulfa antibiotics; and Valium [diazepam]  Social History Social History   Tobacco Use  . Smoking status: Never Smoker  . Smokeless tobacco: Never Used  Substance Use Topics  . Alcohol use: No    Alcohol/week: 0.0 standard drinks  . Drug use: No   Review of Systems Constitutional: Negative for fever. Cardiovascular: Negative for chest pain. Respiratory: Negative for shortness of breath. Gastrointestinal: Negative for abdominal pain, vomiting and diarrhea. Genitourinary: Negative for dysuria. Musculoskeletal: Negative for back pain. Skin: Positive for scalp laceration Neurological: Negative for headaches, focal weakness or numbness.  All systems negative/normal/unremarkable except as stated in the HPI  ____________________________________________   PHYSICAL EXAM:  VITAL SIGNS: ED Triage Vitals [01/12/18 1511]  Enc Vitals Group     BP 130/67     Pulse Rate (!) 111     Resp 18     Temp 97.7 F (36.5 C)     Temp Source Oral     SpO2 95 %     Weight      Height      Head Circumference      Peak Flow  Pain Score      Pain Loc      Pain Edu?      Excl. in [place]?    Constitutional: Alert but disoriented, no distress ENT   Head: Normocephalic, mild left periorbital hematoma and abrasion   Nose: No congestion/rhinnorhea.   Mouth/Throat: Mucous membranes are moist.   Neck: No stridor. Cardiovascular: Normal rate, regular rhythm. No murmurs, rubs, or gallops. Respiratory: Normal respiratory effort without tachypnea nor retractions. Breath sounds are clear and equal bilaterally. No wheezes/rales/rhonchi. Musculoskeletal: Nontender with normal range of motion in extremities. No lower extremity tenderness nor edema. Neurologic:  Normal speech and language. No gross focal neurologic deficits are appreciated.  Skin: Midline parietal scalp laceration of 3 cm Psychiatric:  Mood and affect are normal. ____________________________________________  ED COURSE:  As part of my medical decision making, I reviewed the following data within the [place] History obtained from family if available, nursing notes, old chart and ekg, as well as notes from prior ED visits. Patient presented for fall with scalp laceration, we will assess with labs and imaging as indicated at this time.   Marland Kitchen.Laceration Repair Date/Time: 01/12/2018 6:43 PM Performed by: Earleen Newport, MD Authorized by: Earleen Newport, MD   Consent:    Consent obtained:  Verbal   Consent given by:  Patient   Risks discussed:  Infection, pain, retained foreign body, poor cosmetic result and poor wound healing Anesthesia (see MAR for exact dosages):    Anesthesia method:  None Laceration details:    Location:  Scalp   Length (cm):  3   Depth (mm):  4 Repair type:    Repair type:  Simple Exploration:    Hemostasis achieved with:  Direct pressure   Wound exploration: entire depth of wound probed and visualized     Contaminated: no   Treatment:    Area cleansed with:  Saline   Amount of cleaning:  Extensive   Irrigation solution:  Sterile saline   Visualized foreign bodies/material removed: no   Skin repair:    Repair method:  Staples   Number of staples:  3 Approximation:    Approximation:  Close Post-procedure details:    Dressing:  Sterile dressing   Patient tolerance of procedure:  Tolerated well, no immediate complications   ____________________________________________   RADIOLOGY  Ct head IMPRESSION: 1. Stable age related cerebral atrophy, ventriculomegaly and periventricular white matter disease. Stable remote lacunar type infarct in the left thalamic area. 2. No acute intracranial findings or skull fracture. ____________________________________________  DIFFERENTIAL DIAGNOSIS   Head injury, subdural, scalp laceration  FINAL ASSESSMENT AND  PLAN  Fall, head injury, scalp laceration   Plan: The patient had presented for fall with head injury and scalp laceration.  CT head was negative, wound was repaired as dictated above.  She is discharged with close outpatient follow-up.   Laurence Aly, MD   Note: This note was generated in part or whole with voice recognition software. Voice recognition is usually quite accurate but there are transcription errors that can and very often do occur. I apologize for any typographical errors that were not detected and corrected.     Earleen Newport, MD 01/12/18 [id number]

## 2018-01-12 NOTE — ED Notes (Signed)
Signiture pad not working. Pt husband signing for her on paper.

## 2018-01-14 ENCOUNTER — Other Ambulatory Visit: Payer: Self-pay | Admitting: *Deleted

## 2018-01-14 DIAGNOSIS — C541 Malignant neoplasm of endometrium: Secondary | ICD-10-CM

## 2018-01-14 DIAGNOSIS — C7951 Secondary malignant neoplasm of bone: Secondary | ICD-10-CM

## 2018-01-14 MED ORDER — OXYCODONE-ACETAMINOPHEN 10-325 MG PO TABS: 1.0000 | ORAL_TABLET | Freq: Three times a day (TID) | ORAL | 0 refills | Status: DC | PRN

## 2018-01-14 NOTE — Telephone Encounter (Signed)
Per [facility], only 45 tabs were dispensed on 11/26

## 2018-02-05 ENCOUNTER — Other Ambulatory Visit: Payer: Self-pay | Admitting: *Deleted

## 2018-02-05 DIAGNOSIS — C7951 Secondary malignant neoplasm of bone: Secondary | ICD-10-CM

## 2018-02-05 DIAGNOSIS — C541 Malignant neoplasm of endometrium: Secondary | ICD-10-CM

## 2018-02-05 MED ORDER — OXYCODONE-ACETAMINOPHEN 10-325 MG PO TABS: 1.0000 | ORAL_TABLET | Freq: Three times a day (TID) | ORAL | 0 refills | Status: DC | PRN

## 2018-02-05 NOTE — Telephone Encounter (Signed)
Patient is completely out of her Oxycodone, plerase send refill asap

## 2018-02-16 ENCOUNTER — Other Ambulatory Visit: Payer: Self-pay | Admitting: Internal Medicine

## 2018-02-26 ENCOUNTER — Other Ambulatory Visit: Payer: Self-pay | Admitting: *Deleted

## 2018-02-26 DIAGNOSIS — C7951 Secondary malignant neoplasm of bone: Secondary | ICD-10-CM

## 2018-02-26 DIAGNOSIS — C541 Malignant neoplasm of endometrium: Secondary | ICD-10-CM

## 2018-02-26 MED ORDER — OXYCODONE-ACETAMINOPHEN 10-325 MG PO TABS: 1.0000 | ORAL_TABLET | Freq: Three times a day (TID) | ORAL | 0 refills | Status: DC | PRN

## 2018-02-26 MED ORDER — FENTANYL 25 MCG/HR TD PT72: 1.0000 | MEDICATED_PATCH | TRANSDERMAL | 0 refills | Status: DC

## 2018-02-26 MED ORDER — FENTANYL 100 MCG/HR TD PT72: 1.0000 | MEDICATED_PATCH | TRANSDERMAL | 0 refills | Status: DC

## 2018-03-20 ENCOUNTER — Other Ambulatory Visit: Payer: Self-pay | Admitting: *Deleted

## 2018-03-20 ENCOUNTER — Other Ambulatory Visit: Payer: Self-pay | Admitting: Oncology

## 2018-03-20 DIAGNOSIS — C7951 Secondary malignant neoplasm of bone: Secondary | ICD-10-CM

## 2018-03-20 DIAGNOSIS — C541 Malignant neoplasm of endometrium: Secondary | ICD-10-CM

## 2018-03-20 MED ORDER — OXYCODONE-ACETAMINOPHEN 10-325 MG PO TABS: 1.0000 | ORAL_TABLET | Freq: Three times a day (TID) | ORAL | 0 refills | Status: DC | PRN

## 2018-03-20 NOTE — Telephone Encounter (Signed)
Patient called cancer center requesting refill of Percocet 10-325 mg q 3 hours PRN for pain.   As mandated by the [place] STOP Act (Strengthen Opioid Misuse Prevention), the [place] Controlled Substance Reporting System ([place]) was reviewed for this patient.  Below is the past 26-months of controlled substance prescriptions as displayed by the registry.  I have personally consulted with my supervising physician, Dr. Rogue Bussing who agrees that continuation of opiate therapy is medically appropriate at this time and agrees to provide continual monitoring, including urine/blood drug screens, as indicated. Refill is appropriate on or after  03/12/18.  NCCSRS reviewed:     Faythe Casa, NP 03/20/2018 1:10 PM [phone]

## 2018-04-02 ENCOUNTER — Telehealth: Payer: Self-pay | Admitting: *Deleted

## 2018-04-02 NOTE — Telephone Encounter (Signed)
Traci Rowan- I am ok with pt getting Lasix 40 mg/day x 7 days. Please inform hospice RN.

## 2018-04-02 NOTE — Telephone Encounter (Signed)
He nurse called reporting new onset of bilateral lower extremity edema 3-4 +. She is requesting a diuretic and would like a call back ASAP so she can give a dose out of the comfort kit if doctor agrees. Please advise

## 2018-04-02 NOTE — Telephone Encounter (Signed)
Toni notified. 

## 2018-04-09 ENCOUNTER — Telehealth: Payer: Self-pay | Admitting: *Deleted

## 2018-04-09 ENCOUNTER — Other Ambulatory Visit: Payer: Self-pay | Admitting: *Deleted

## 2018-04-09 DIAGNOSIS — C541 Malignant neoplasm of endometrium: Secondary | ICD-10-CM

## 2018-04-09 DIAGNOSIS — C7951 Secondary malignant neoplasm of bone: Secondary | ICD-10-CM

## 2018-04-09 MED ORDER — FENTANYL 25 MCG/HR TD PT72: 1.0000 | MEDICATED_PATCH | TRANSDERMAL | 0 refills | Status: DC

## 2018-04-09 MED ORDER — OXYCODONE-ACETAMINOPHEN 10-325 MG PO TABS: 1.0000 | ORAL_TABLET | Freq: Three times a day (TID) | ORAL | 0 refills | Status: DC | PRN

## 2018-04-09 MED ORDER — QUETIAPINE FUMARATE 25 MG PO TABS: 25.0000 mg | ORAL_TABLET | Freq: Every day | ORAL | 1 refills | Status: DC

## 2018-04-09 MED ORDER — FENTANYL 100 MCG/HR TD PT72: 1.0000 | MEDICATED_PATCH | TRANSDERMAL | 0 refills | Status: DC

## 2018-04-09 NOTE — Telephone Encounter (Signed)
Hospice nurse called reporting that patient continues to have 3+ edema in bilateral lower extremities form knees down. Requesting to continue Lasix dosing daily. Also reports that patient remains agitated and irritable at night even with addition of Lorazepam and would like to have order for Seroquel. Please advise

## 2018-04-09 NOTE — Telephone Encounter (Signed)
Patient called cancer center requesting refill of   As mandated by the [place] STOP Act (Strengthen Opioid Misuse Prevention), the [place] Controlled Substance Reporting System ([place]) was reviewed for this patient.  Below is the past 16-months of controlled substance prescriptions as displayed by the registry.  I have personally consulted with my supervising physician, Dr. Rogue Bussing, who agrees that continuation of opiate therapy is medically appropriate at this time and agrees to provide continual monitoring, including urine/blood drug screens, as indicated. Refill is appropriate. She is a hospice patient.   NCCSRS reviewed:     Traci Casa, NP 04/09/2018 10:39 AM [phone]

## 2018-04-09 NOTE — Telephone Encounter (Signed)
Vivien Rota informed of new orders to day/c Lorazepam and continue Lasix.Prescription e scribed to [place].

## 2018-04-09 NOTE — Telephone Encounter (Signed)
Traci [name]- ok to continue lasix at current dose- indefinitely.  Discontinue lorazepam.  Start/pleas send- seroquel at 25mg  qhs x30 pills; 1 refill.

## 2018-04-18 ENCOUNTER — Telehealth: Payer: Self-pay | Admitting: *Deleted

## 2018-04-18 MED ORDER — QUETIAPINE FUMARATE 50 MG PO TABS: 50.0000 mg | ORAL_TABLET | Freq: Every day | ORAL | 3 refills | Status: DC

## 2018-04-18 NOTE — Telephone Encounter (Signed)
Traci [name]  called reporting that patient is not resting at night still. She has tried and failed valium and Seroquel 25 mg needs to increase dose please. She is keeping her husband up all night long.  Discussed with J Borders, NP and ordered to increase Seroquel to 50 mg at hs. Prescription e scribed

## 2018-04-30 ENCOUNTER — Other Ambulatory Visit: Payer: Self-pay | Admitting: *Deleted

## 2018-04-30 DIAGNOSIS — C541 Malignant neoplasm of endometrium: Secondary | ICD-10-CM

## 2018-04-30 DIAGNOSIS — C7951 Secondary malignant neoplasm of bone: Secondary | ICD-10-CM

## 2018-04-30 MED ORDER — OXYCODONE-ACETAMINOPHEN 10-325 MG PO TABS: 1.0000 | ORAL_TABLET | Freq: Three times a day (TID) | ORAL | 0 refills | Status: DC | PRN

## 2018-05-19 ENCOUNTER — Other Ambulatory Visit: Payer: Self-pay | Admitting: Internal Medicine

## 2018-05-21 ENCOUNTER — Other Ambulatory Visit: Payer: Self-pay | Admitting: *Deleted

## 2018-05-21 DIAGNOSIS — C7951 Secondary malignant neoplasm of bone: Secondary | ICD-10-CM

## 2018-05-21 DIAGNOSIS — C541 Malignant neoplasm of endometrium: Secondary | ICD-10-CM

## 2018-05-21 MED ORDER — OXYCODONE-ACETAMINOPHEN 10-325 MG PO TABS: 1.0000 | ORAL_TABLET | Freq: Three times a day (TID) | ORAL | 0 refills | Status: DC | PRN

## 2018-05-28 ENCOUNTER — Encounter: Attending: Physician Assistant | Admitting: Physician Assistant

## 2018-05-28 ENCOUNTER — Other Ambulatory Visit: Payer: Self-pay

## 2018-05-28 DIAGNOSIS — N189 Chronic kidney disease, unspecified: Secondary | ICD-10-CM | POA: Diagnosis not present

## 2018-05-28 DIAGNOSIS — L89620 Pressure ulcer of left heel, unstageable: Secondary | ICD-10-CM | POA: Diagnosis not present

## 2018-05-28 DIAGNOSIS — L89892 Pressure ulcer of other site, stage 2: Secondary | ICD-10-CM | POA: Diagnosis not present

## 2018-05-28 DIAGNOSIS — L89610 Pressure ulcer of right heel, unstageable: Secondary | ICD-10-CM | POA: Insufficient documentation

## 2018-05-28 DIAGNOSIS — C494 Malignant neoplasm of connective and soft tissue of abdomen: Secondary | ICD-10-CM | POA: Diagnosis not present

## 2018-05-28 DIAGNOSIS — F0150 Vascular dementia without behavioral disturbance: Secondary | ICD-10-CM | POA: Diagnosis not present

## 2018-05-28 NOTE — Progress Notes (Signed)
Traci, [name] ([id number]) Visit Report for 05/28/2018 Abuse/Suicide Risk Screen Details Patient Name: Traci [name], Traci [name] Date of Service: 05/28/2018 10:15 AM Medical Record Number: [id number] Patient Account Number: [id number] Date of Birth/Sex: May 31, 1929 (85 y.o. F) Treating RN: Cornell Barman Primary Care Zaidee Rion: Fulton Reek Other Clinician: Referring Bessie Livingood: Fulton Reek Treating Ocean Schildt/Extender: Melburn Hake, HOYT Weeks in Treatment: 0 Abuse/Suicide Risk Screen Items Answer ABUSE/SUICIDE RISK SCREEN: Patient displays signs or symptoms of abuse and/or neglect. No Notes Patient unable to answer due to dementia. Electronic Signature(s) Signed: 05/28/2018 3:23:54 PM By: Gretta Cool, BSN, RN, CWS, Kim RN, BSN Entered By: Gretta Cool, BSN, RN, CWS, Kim on 05/28/2018 10:45:47 Traci [name] ([id number]) -------------------------------------------------------------------------------- Activities of Daily Living Details Patient Name: Traci [name] Date of Service: 05/28/2018 10:15 AM Medical Record Number: [id number] Patient Account Number: [id number] Date of Birth/Sex: [birth year] (85 y.o. F) Treating RN: Cornell Barman Primary Care Brielyn Bosak: Fulton Reek Other Clinician: Referring Triston Skare: Fulton Reek Treating Lanier Felty/Extender: Melburn Hake, HOYT Weeks in Treatment: 0 Activities of Daily Living Items Answer Activities of Daily Living (Please select one for each item) Drive Automobile Not Able Take Medications Not Able Use Telephone Not Able Care for Appearance Not Able Use Toilet Not Able Manus Rudd / Shower Not Able Dress Self Not Able Feed Self Not Able Walk Not Able Get In / Out Bed Not [place] Not Able Shop for Self Not Able Electronic Signature(s) Signed: 05/28/2018 3:23:54 PM By: Gretta Cool, BSN, RN, CWS, Kim RN, BSN Entered By: Gretta Cool, BSN, RN, CWS, Kim on 05/28/2018 10:46:07 Traci [name]  ([id number]) -------------------------------------------------------------------------------- Education Screening Details Patient Name: Traci [name] Date of Service: 05/28/2018 10:15 AM Medical Record Number: [id number] Patient Account Number: [id number] Date of Birth/Sex: [birth year] (85 y.o. F) Treating RN: Cornell Barman Primary Care Taevyn Hausen: Fulton Reek Other Clinician: Referring Caine Barfield: Fulton Reek Treating Vanisha Whiten/Extender: Melburn Hake, HOYT Weeks in Treatment: 0 Primary Learner Assessed: Caregiver Reason Patient is not Primary Learner: dementia Learning Preferences/Education Level/Primary Language Learning Preference: Explanation, Demonstration Preferred Language: English Cognitive Barrier Language Barrier: No Translator Needed: No Memory Deficit: No Emotional Barrier: No Cultural/Religious Beliefs Affecting Medical Care: No Physical Barrier Impaired Vision: No Impaired Hearing: No Knowledge/Comprehension Knowledge Level: High Comprehension Level: High Ability to understand written High instructions: Ability to understand verbal High instructions: Motivation Anxiety Level: Calm Cooperation: Cooperative Education Importance: Acknowledges Need Interest in Health Problems: Asks Questions Perception: Coherent Willingness to Engage in Self- High Management Activities: Readiness to Engage in Self- High Management Activities: Electronic Signature(s) Signed: 05/28/2018 3:23:54 PM By: Gretta Cool, BSN, RN, CWS, Kim RN, BSN Entered By: Gretta Cool, BSN, RN, CWS, Kim on 05/28/2018 10:47:10 Traci [name] ([id number]) -------------------------------------------------------------------------------- Fall Risk Assessment Details Patient Name: Traci [name] Date of Service: 05/28/2018 10:15 AM Medical Record Number: [id number] Patient Account Number: [id number] Date of Birth/Sex: 1929/11/19 (85 y.o. F) Treating RN: Cornell Barman Primary Care Glorianna Gott: Fulton Reek Other  Clinician: Referring Dvid Pendry: Fulton Reek Treating Korey Arroyo/Extender: Melburn Hake, HOYT Weeks in Treatment: 0 Fall Risk Assessment Items Have you had 2 or more falls in the last 12 monthso 0 Yes Have you had any fall that resulted in injury in the last 12 monthso 0 No FALL RISK ASSESSMENT: History of falling - immediate or within 3 months 25 Yes Secondary diagnosis 0 No Ambulatory aid None/bed rest/wheelchair/nurse 0 Yes Crutches/cane/walker 0 No Furniture 0 No IV Access/Saline Lock 0 No Gait/Training Normal/bed rest/immobile 0 Yes Weak 0 No Impaired 0 No Mental Status  Oriented to own ability 0 No Electronic Signature(s) Signed: 05/28/2018 3:23:54 PM By: Gretta Cool, BSN, RN, CWS, Kim RN, BSN Entered By: Gretta Cool, BSN, RN, CWS, Kim on 05/28/2018 10:47:37 Traci [name] ([id number]) -------------------------------------------------------------------------------- Foot Assessment Details Patient Name: Traci [name] Date of Service: 05/28/2018 10:15 AM Medical Record Number: [id number] Patient Account Number: [id number] Date of Birth/Sex: 12/19/1929 (85 y.o. F) Treating RN: Cornell Barman Primary Care Persephonie Hegwood: Fulton Reek Other Clinician: Referring Aerik Polan: Fulton Reek Treating Kazuto Sevey/Extender: Melburn Hake, HOYT Weeks in Treatment: 0 Foot Assessment Items [x]  Unable to perform due to altered mental status Site Locations + = Sensation present, - = Sensation absent, C = Callus, U = Ulcer R = Redness, W = Warmth, M = Maceration, PU = Pre-ulcerative lesion F = Fissure, S = Swelling, D = Dryness Assessment Right: Left: Other Deformity: No No Prior Foot Ulcer: No No Prior Amputation: No No Charcot Joint: No No Ambulatory Status: Non-ambulatory Assistance Device: Wheelchair Gait: Engineer, maintenance) Signed: 05/28/2018 3:23:54 PM By: Gretta Cool, BSN, RN, CWS, Kim RN, BSN Entered By: Gretta Cool, BSN, RN, CWS, Kim on 05/28/2018 10:48:10 Traci [name]  ([id number]) -------------------------------------------------------------------------------- Nutrition Risk Screening Details Patient Name: Traci [name] Date of Service: 05/28/2018 10:15 AM Medical Record Number: [id number] Patient Account Number: [id number] Date of Birth/Sex: Nov 17, 1929 (85 y.o. F) Treating RN: Cornell Barman Primary Care Kleber Crean: Fulton Reek Other Clinician: Referring Mell Guia: Fulton Reek Treating Izaha Shughart/Extender: Melburn Hake, HOYT Weeks in Treatment: 0 Height (in): 62 Weight (lbs): 108 Body Mass Index (BMI): 19.8 Nutrition Risk Screening Items Score Screening NUTRITION RISK SCREEN: I have an illness or condition that made me change the kind and/or amount of 0 No food I eat I eat fewer than two meals per day 0 No I eat few fruits and vegetables, or milk products 0 No I have three or more drinks of beer, liquor or wine almost every day 0 No I have tooth or mouth problems that make it hard for me to eat 0 No I don't always have enough money to buy the food I need 0 No I eat alone most of the time 0 No I take three or more different prescribed or over-the-counter drugs a day 0 No Without wanting to, I have lost or gained 10 pounds in the last six months 0 No I am not always physically able to shop, cook and/or feed myself 0 No Nutrition Protocols Good Risk Protocol Moderate Risk Protocol Provide education on High Risk Proctocol 0 nutrition Risk Level: Good Risk Score: 0 Electronic Signature(s) Signed: 05/28/2018 3:23:54 PM By: Gretta Cool, BSN, RN, CWS, Kim RN, BSN Entered By: Gretta Cool, BSN, RN, CWS, Kim on 05/28/2018 10:47:55

## 2018-06-10 ENCOUNTER — Other Ambulatory Visit: Payer: Self-pay | Admitting: *Deleted

## 2018-06-10 DIAGNOSIS — C541 Malignant neoplasm of endometrium: Secondary | ICD-10-CM

## 2018-06-10 DIAGNOSIS — C7951 Secondary malignant neoplasm of bone: Secondary | ICD-10-CM

## 2018-06-10 MED ORDER — FENTANYL 100 MCG/HR TD PT72: 1.0000 | MEDICATED_PATCH | TRANSDERMAL | 0 refills | Status: AC

## 2018-06-10 MED ORDER — OXYCODONE-ACETAMINOPHEN 10-325 MG PO TABS: 1.0000 | ORAL_TABLET | Freq: Three times a day (TID) | ORAL | 0 refills | Status: AC | PRN

## 2018-06-10 MED ORDER — FENTANYL 25 MCG/HR TD PT72: 1.0000 | MEDICATED_PATCH | TRANSDERMAL | 0 refills | Status: AC

## 2018-06-17 ENCOUNTER — Other Ambulatory Visit: Payer: Self-pay | Admitting: *Deleted

## 2018-06-17 MED ORDER — QUETIAPINE FUMARATE 50 MG PO TABS: 50.0000 mg | ORAL_TABLET | Freq: Every day | ORAL | 3 refills | Status: AC

## 2018-06-24 NOTE — Progress Notes (Signed)
Traci [name], Traci [name] ([id number]) Visit Report for 05/28/2018 Chief Complaint Document Details Patient Name: PEYTYN, TRINE Date of Service: 05/28/2018 10:15 AM Medical Record Number: [id number] Patient Account Number: [id number] Date of Birth/Sex: April 20, 1929 (85 y.o. F) Treating RN: Montey Hora Primary Care Provider: Fulton Reek Other Clinician: Referring Provider: Fulton Reek Treating Provider/Extender: Melburn Hake, Wang [place] Weeks in Treatment: 0 Information Obtained from: Patient Chief Complaint Bilateral LE Ulcers Electronic Signature(s) Signed: 06/24/2018 1:10:56 AM By: Worthy Keeler PA-C Entered By: Worthy Keeler on 05/28/2018 11:17:32 Wienke, Traci [name] ([id number]) -------------------------------------------------------------------------------- HPI Details Patient Name: Traci [name] Date of Service: 05/28/2018 10:15 AM Medical Record Number: [id number] Patient Account Number: [id number] Date of Birth/Sex: [birth year] (85 y.o. F) Treating RN: Montey Hora Primary Care Provider: Fulton Reek Other Clinician: Referring Provider: Fulton Reek Treating Provider/Extender: Melburn Hake, Traci [name] Weeks in Treatment: 0 History of Present Illness HPI Description: 05/28/18 patient presents for initial evaluation today to consider multiple ulcers that she has of the bilateral lower extremities. The patient does have dementia and unfortunately she is currently being treated actively due to uterine, breast, and pelvic cancer. This is fairly widespread based on what I'm seeing in the review of her chart. Unfortunately she was not able to get a lot of history in the review of systems nor past medical history secondary to the dementia. She definitely appears to be chronically poor and I'm not actually sure how much time she has been given as far as records but [facility] at this point is concerned. Nonetheless the goal treatment at this point is going to basically be making recommendations for  her in order to help prevent infection and keep her as comfortable as possible. No fevers, chills, nausea, or vomiting noted at this time. Electronic Signature(s) Signed: 06/24/2018 1:10:56 AM By: Worthy Keeler PA-C Entered By: Worthy Keeler on 06/24/2018 00:34:33 Traci [name] ([id number]) -------------------------------------------------------------------------------- Physical Exam Details Patient Name: Traci [name] Date of Service: 05/28/2018 10:15 AM Medical Record Number: [id number] Patient Account Number: [id number] Date of Birth/Sex: 1929/12/25 (85 y.o. F) Treating RN: Montey Hora Primary Care Provider: Fulton Reek Other Clinician: Referring Provider: Fulton Reek Treating Provider/Extender: Melburn Hake, Traci [name] Weeks in Treatment: 0 Constitutional sitting or standing blood pressure is within target range for patient.. pulse regular and within target range for patient.Marland Kitchen respirations regular, non-labored and within target range for patient.Marland Kitchen temperature within target range for patient.. Chronically ill appearing but in no apparent acute distress. Eyes conjunctiva clear no eyelid edema noted. pupils equal round and reactive to light and accommodation. Ears, Nose, Mouth, and Throat no gross abnormality of ear auricles or external auditory canals. mucus membranes moist. Respiratory normal breathing without difficulty. clear to auscultation bilaterally. Cardiovascular regular rate and rhythm with normal S1, S2. Absent posterior tibial and dorsalis pedis pulses bilateral lower extremities. trace pitting edema of the bilateral lower extremities. Gastrointestinal (GI) soft, non-tender, non-distended, +BS. no ventral hernia noted. Musculoskeletal Patient unable to walk. Psychiatric Patient is not able to cooperate in decision making regarding care. Patient has dementia. patient is confused. Notes On inspection today patient's wounds are a combination of dry eschar as  well as some areas that are draining at this point. Fortunately there does not appear to be any signs of active infection at this time which is great. With that being said again I do not think these wounds are likely to completely healed but rather are much more likely to be something that you just have to maintain and hopefully  help prevent infection. Electronic Signature(s) Signed: 06/24/2018 1:10:56 AM By: Worthy Keeler PA-C Entered By: Worthy Keeler on 06/24/2018 00:37:48 Traci [name] ([id number]) -------------------------------------------------------------------------------- Physician Orders Details Patient Name: Traci [name] Date of Service: 05/28/2018 10:15 AM Medical Record Number: [id number] Patient Account Number: [id number] Date of Birth/Sex: March 21, 1929 (85 y.o. 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 o May Shower, gently pat wound dry prior to applying new dressing. Wound #3 Right,Posterior Lower Leg o Clean wound with Normal Saline. o May Shower, gently pat wound dry prior to applying new dressing. Primary Wound Dressing Wound #1 Left Calcaneus o Silver Alginate - to draining/wet areas o Other: - paint dry area with betadine Wound #3 Right,Posterior Lower Leg o Silver Alginate Secondary Dressing Wound #1 Left Calcaneus o ABD and  Kerlix/Conform - secure with stretch netting Wound #3 Right,Posterior Lower Leg o ABD and Kerlix/Conform - secure with stretch netting Dressing Change Frequency Wound #1 Left Calcaneus o Other: - twice weekly Wound #3 Right,Posterior Lower Leg o Other: - twice weekly Follow-up Appointments Wound #1 Left Calcaneus o Return Appointment in 3 weeks. Wound #3 Right,Posterior Lower Leg o Return Appointment in 3 weeks. Home Health Wound #1 Left Calcaneus Traci [name], Traci [name] ([id number]) o [place] [facility] [place] Nurse may visit PRN to address patientos wound care needs. o FACE TO FACE ENCOUNTER: MEDICARE and MEDICAID PATIENTS: I certify that this patient is under my care and that I had a face-to-face encounter that meets the physician face-to-face encounter requirements with this patient on this date. The encounter with the patient was in whole or in part for the following MEDICAL CONDITION: (primary reason for [place]) MEDICAL NECESSITY: I certify, that based on my findings, NURSING services are a medically necessary home health service. HOME BOUND STATUS: I certify that my clinical findings support that this patient is homebound (i.e., Due to illness or injury, pt requires aid of supportive devices such as crutches, cane, wheelchairs, walkers, the use of special transportation or the assistance of another person to leave their place of residence. There is a normal inability to leave the home and doing so requires considerable and taxing effort. Other absences are for medical reasons / religious services and are infrequent or of short duration when for other reasons). o If current dressing causes regression in wound condition, may D/C ordered dressing product/s and apply Normal Saline Moist Dressing daily until next [place] / Other MD appointment. [place] of regression in wound condition at  [phone]. o Please direct any NON-WOUND related issues/requests for orders to patient's Primary Care Physician Wound #3 [place] Visits - [facility] Nurse may visit PRN to address patientos wound care needs. o FACE TO FACE ENCOUNTER: MEDICARE and MEDICAID PATIENTS: I certify that this patient is under my care and that I had a face-to-face encounter that meets the physician face-to-face encounter requirements with this patient  on this date. The encounter with the patient was in whole or in part for the following MEDICAL CONDITION: (primary reason for [place]) MEDICAL NECESSITY: I certify, that based on my findings, NURSING services are a medically necessary home health service. HOME BOUND STATUS: I certify that my clinical findings support that this patient is homebound (i.e., Due to illness or injury, pt requires aid of supportive devices such as crutches, cane, wheelchairs, walkers, the use of special transportation or the assistance of another person to leave their place of residence. There is a normal inability to leave the home and doing so requires considerable and taxing effort. Other absences are for medical reasons / religious services and are infrequent or of short duration when for other reasons). o If current dressing causes regression in wound condition, may D/C ordered dressing product/s and apply Normal Saline Moist Dressing daily until next [place] / Other MD appointment. [place] of regression in wound condition at [phone]. o Please direct any NON-WOUND related issues/requests for orders to patient's Primary Care Physician Electronic Signature(s) Signed: 05/28/2018 2:55:40 PM By: Montey Hora Signed: 06/24/2018 1:10:56 AM By: Worthy Keeler PA-C Entered By: Montey Hora on 05/28/2018 11:28:49 Traci [name], Traci [name]  ([id number]) -------------------------------------------------------------------------------- Problem List Details Patient Name: Traci [name] Date of Service: 05/28/2018 10:15 AM Medical Record Number: [id number] Patient Account Number: [id number] Date of Birth/Sex: 22-Sep-1929 (85 y.o. F) Treating RN: Montey Hora Primary Care Provider: Fulton Reek Other Clinician: Referring Provider: Fulton Reek Treating Provider/Extender: Melburn Hake, Traci [name] Weeks in Treatment: 0 Active Problems ICD-10 Evaluated Encounter Code Description Active Date Today Diagnosis L89.620 Pressure ulcer of left heel, unstageable 05/28/2018 No Yes L89.610 Pressure ulcer of right heel, unstageable 05/28/2018 No Yes L89.892 Pressure ulcer of other site, stage 2 05/28/2018 No Yes C49.4 Malignant neoplasm of connective and soft tissue of 05/28/2018 No Yes abdomen F01.50 Vascular dementia without behavioral disturbance 05/28/2018 No Yes N18.9 Chronic kidney disease, unspecified 05/28/2018 No Yes Inactive Problems Resolved Problems Electronic Signature(s) Signed: 06/24/2018 1:10:56 AM By: Worthy Keeler PA-C Entered By: Worthy Keeler on 05/28/2018 11:17:17 Ehrman, Traci [name] ([id number]) -------------------------------------------------------------------------------- Progress Note Details Patient Name: Traci [name] Date of Service: 05/28/2018 10:15 AM Medical Record Number: [id number] Patient Account Number: [id number] Date of Birth/Sex: Aug 25, 1929 (85 y.o. F) Treating RN: Montey Hora Primary Care Provider: Fulton Reek Other Clinician: Referring Provider: Fulton Reek Treating Provider/Extender: Melburn Hake, Traci [name] Weeks in Treatment: 0 Subjective Chief Complaint Information obtained from Patient Bilateral LE Ulcers History of Present Illness (HPI) 05/28/18 patient presents for initial evaluation today to consider multiple ulcers that she has of the bilateral lower extremities. The patient does  have dementia and unfortunately she is currently being treated actively due to uterine, breast, and pelvic cancer. This is fairly widespread based on what I'm seeing in the review of her chart. Unfortunately she was not able to get a lot of history in the review of systems nor past medical history secondary to the dementia. She definitely appears to be chronically poor and I'm not actually sure how much time she has been given as far as records but [facility] at this point is concerned. Nonetheless the goal treatment at this point is going to basically be making recommendations for her in order to help prevent infection and keep her as comfortable as possible. No fevers, chills, nausea, or vomiting noted at this time. Patient History Unable to Obtain Patient History due to Dementia. Allergies No Known Drug Allergies Family  History No family history of Cancer, Diabetes, Heart Disease, Hypertension, Kidney Disease, Lung Disease, Stroke, Thyroid Problems. Social History Never smoker, Marital Status - Married, Alcohol Use - Never, Drug Use - No History, Caffeine Use - Never. Medical History Neurologic Patient has history of Dementia Medical And Surgical History Notes Musculoskeletal Hip Pain cancer related Review of Systems (ROS) Eyes Denies complaints or symptoms of Dry Eyes, Vision Changes, Glasses / Contacts. Ear/Nose/Mouth/Throat Denies complaints or symptoms of Difficult clearing ears, Sinusitis. Hematologic/Lymphatic Denies complaints or symptoms of Bleeding / Clotting Disorders, Human Immunodeficiency Virus. Respiratory Denies complaints or symptoms of Chronic or frequent coughs, Shortness of Breath. Cardiovascular Denies complaints or symptoms of Chest pain, LE edema. Gastrointestinal Denies complaints or symptoms of Frequent diarrhea, Nausea, Vomiting. Endocrine Denies complaints or symptoms of Hepatitis, Thyroid disease, Polydypsia (Excessive Thirst). Genitourinary Complains or has  symptoms of Incontinence/dribbling. Denies complaints or symptoms of Kidney failure/ Dialysis. Integumentary (Skin) Traci [name], Traci [name]. ([id number]) Complains or has symptoms of Wounds, Bleeding or bruising tendency. Denies complaints or symptoms of Breakdown, Swelling. Musculoskeletal Denies complaints or symptoms of Muscle Pain, Muscle Weakness. Neurologic Denies complaints or symptoms of Numbness/parasthesias, Focal/Weakness. Oncologic Currently not treating, uterine, breast and pervic areas Objective Constitutional sitting or standing blood pressure is within target range for patient.. pulse regular and within target range for patient.Marland Kitchen respirations regular, non-labored and within target range for patient.Marland Kitchen temperature within target range for patient.. Chronically ill appearing but in no apparent acute distress. Vitals Time Taken: 10:40 AM, Height: 62 in, Weight: 108 lbs, BMI: 19.8, Temperature: 97.8 F, Pulse: 77 bpm, Respiratory Rate: 16 breaths/min, Blood Pressure: 130/67 mmHg. Eyes conjunctiva clear no eyelid edema noted. pupils equal round and reactive to light and accommodation. Ears, Nose, Mouth, and Throat no gross abnormality of ear auricles or external auditory canals. mucus membranes moist. Respiratory normal breathing without difficulty. clear to auscultation bilaterally. Cardiovascular regular rate and rhythm with normal S1, S2. Absent posterior tibial and dorsalis pedis pulses bilateral lower extremities. trace pitting edema of the bilateral lower extremities. Gastrointestinal (GI) soft, non-tender, non-distended, +BS. no ventral hernia noted. Musculoskeletal Patient unable to walk. Psychiatric Patient is not able to cooperate in decision making regarding care. Patient has dementia. patient is confused. General Notes: On inspection today patient's wounds are a combination of dry eschar as well as some areas that are draining at this point. Fortunately there does not  appear to be any signs of active infection at this time which is great. With that being said again I do not think these wounds are likely to completely healed but rather are much more likely to be something that you just have to maintain and hopefully help prevent infection. Integumentary (Hair, Skin) Wound #1 status is Open. Original cause of wound was Pressure Injury. The wound is located on the Left Calcaneus. The wound measures 3.5cm length x 3.2cm width x 0.1cm depth; 8.796cm^2 area and 0.88cm^3 volume. There is a none present amount of drainage noted. The wound margin is flat and intact. There is no granulation within the wound bed. There is a large (67-100%) amount of necrotic tissue within the wound bed including Eschar. Wound #3 status is Open. Original cause of wound was Pressure Injury. The wound is located on the Right,Posterior Lower Leg. The wound measures 2cm length x 1cm width x 0.1cm depth; 1.571cm^2 area and 0.157cm^3 volume. There is Fat Layer (Subcutaneous Tissue) Exposed exposed. There is no tunneling or undermining noted. There is a medium amount of serous drainage noted.  The wound margin is indistinct and nonvisible. There is small (1-33%) pink granulation within the wound bed. There is a medium (34-66%) amount of necrotic tissue within the wound bed including Adherent Slough. The Traci [name], Traci [name]. ([id number]) periwound skin appearance exhibited: Erythema. The surrounding wound skin color is noted with erythema. 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 May Shower, gently pat wound dry prior to applying new dressing. Wound #3  Right,Posterior Lower Leg: Clean wound with Normal Saline. May Shower, gently pat wound dry prior to applying new dressing. Primary Wound Dressing: Wound #1 Left Calcaneus: Silver Alginate - to draining/wet areas Other: - paint dry area with betadine Wound #3 Right,Posterior Lower Leg: Silver Alginate Secondary Dressing: Wound #1 Left Calcaneus: ABD and Kerlix/Conform - secure with stretch netting Wound #3 Right,Posterior Lower Leg: ABD and Kerlix/Conform - secure with stretch netting Dressing Change Frequency: Wound #1 Left Calcaneus: Other: - twice weekly Wound #3 Right,Posterior Lower Leg: Other: - twice weekly Follow-up Appointments: Wound #1 Left Calcaneus: Return Appointment in 3 weeks. Wound #3 Right,Posterior Lower Leg: Return Appointment in 3 weeks. Home Health: Wound #1 Left Calcaneus: Continue Home Health Visits - [facility] Nurse may visit PRN to address patient s wound care needs. FACE TO FACE ENCOUNTER: MEDICARE and MEDICAID PATIENTS: I certify that this patient is under my care and that I had a face-to-face encounter that meets the physician face-to-face encounter requirements with this patient on this date. The encounter with the patient was in whole or in part for the following MEDICAL CONDITION: (primary reason for [place]) MEDICAL NECESSITY: I certify, that based on my findings, NURSING services are a medically necessary home health service. HOME BOUND STATUS: I certify that my clinical findings support that this patient is homebound (i.e., Due to illness or injury, pt requires aid of supportive devices such as crutches, cane, wheelchairs, walkers, the use of special transportation or the assistance of another person to leave their place of residence. There is a normal inability to leave the home and doing so requires considerable and taxing effort. Other absences are for medical reasons / religious services and are infrequent or of  short duration when for other reasons). If current dressing causes regression in wound condition, may D/C ordered dressing product/s and apply Normal Saline Moist Dressing daily until next [place] / Other MD appointment. [facility] of regression in [place]. ([id number]) wound condition at [phone]. Please direct any NON-WOUND related issues/requests for orders to patient's Primary Care Physician Wound #3 Right,Posterior Lower Leg: [place] [facility] [place] Nurse may visit PRN to address patient s wound care needs. FACE TO FACE ENCOUNTER: MEDICARE and MEDICAID PATIENTS: I certify that this patient is under my care and that I had a face-to-face encounter that meets the physician face-to-face encounter requirements with this patient on this date. The encounter with the patient was in whole or in part for the following MEDICAL CONDITION: (primary reason for [place]) MEDICAL NECESSITY: I certify, that based on my findings, NURSING services are a medically necessary home health service. HOME BOUND STATUS: I certify that my clinical findings support that this patient is homebound (i.e., Due  to illness or injury, pt requires aid of supportive devices such as crutches, cane, wheelchairs, walkers, the use of special transportation or the assistance of another person to leave their place of residence. There is a normal inability to leave the home and doing so requires considerable and taxing effort. Other absences are for medical reasons / religious services and are infrequent or of short duration when for other reasons). If current dressing causes regression in wound condition, may D/C ordered dressing product/s and apply Normal Saline Moist Dressing daily until next [place] / Other MD appointment. [place] of regression in wound condition at [phone]. Please direct any NON-WOUND  related issues/requests for orders to patient's Primary Care Physician At this point my suggestion is gonna be that we go ahead and initiate the above wound care measures for the next week and the patient is in agreement with plan. We will subsequently see were things stand at follow-up. If anything changes or worsens meantime our office will be contacted. Otherwise we will just see the patient is a consult today if any other recommendations are desired then we will be happy to see the patient back. Otherwise I think at this point that will focus more on palliative/conservative care for the patient. Electronic Signature(s) Signed: 06/24/2018 1:10:56 AM By: Worthy Keeler PA-C Entered By: Worthy Keeler on 06/24/2018 01:10:15 Traci [name] ([id number]) -------------------------------------------------------------------------------- ROS/PFSH Details Patient Name: Traci [name] Date of Service: 05/28/2018 10:15 AM Medical Record Number: [id number] Patient Account Number: [id number] Date of Birth/Sex: [birth year] (85 y.o. F) Treating RN: Cornell Barman Primary Care Provider: Fulton Reek Other Clinician: Referring Provider: Fulton Reek Treating Provider/Extender: Melburn Hake, Traci [name] Weeks in Treatment: 0 Unable to Obtain Patient History due to oo Dementia Eyes Complaints and Symptoms: Negative for: Dry Eyes; Vision Changes; Glasses / Contacts Ear/Nose/Mouth/Throat Complaints and Symptoms: Negative for: Difficult clearing ears; Sinusitis Hematologic/Lymphatic Complaints and Symptoms: Negative for: Bleeding / Clotting Disorders; Human Immunodeficiency Virus Respiratory Complaints and Symptoms: Negative for: Chronic or frequent coughs; Shortness of Breath Cardiovascular Complaints and Symptoms: Negative for: Chest pain; LE edema Gastrointestinal Complaints and Symptoms: Negative for: Frequent diarrhea; Nausea; Vomiting Endocrine Complaints and Symptoms: Negative for: Hepatitis;  Thyroid disease; Polydypsia (Excessive Thirst) Genitourinary Complaints and Symptoms: Positive for: Incontinence/dribbling Negative for: Kidney failure/ Dialysis Integumentary (Skin) Complaints and Symptoms: Positive for: Wounds; Bleeding or bruising tendency Negative for: Breakdown; Swelling Musculoskeletal Complaints and Symptoms: Negative for: Muscle Pain; Muscle Weakness Medical History: Past Medical History Notes: Hip Pain cancer related Traci [name], Traci [name]. ([id number]) Neurologic Complaints and Symptoms: Negative for: Numbness/parasthesias; Focal/Weakness Medical History: Positive for: Dementia Oncologic Complaints and Symptoms: Review of System Notes: Currently not treating, uterine, breast and pervic areas Immunizations Pneumococcal Vaccine: Received Pneumococcal Vaccination: Yes Implantable Devices No devices added Family and Social History Cancer: No; Diabetes: No; Heart Disease: No; Hypertension: No; Kidney Disease: No; Lung Disease: No; Stroke: No; Thyroid Problems: No; Never smoker; Marital Status - Married; Alcohol Use: Never; Drug Use: No History; Caffeine Use: Never Electronic Signature(s) Signed: 05/28/2018 3:23:54 PM By: Gretta Cool, BSN, RN, CWS, Kim RN, BSN Signed: 06/24/2018 1:10:56 AM By: Worthy Keeler PA-C Entered By: Gretta Cool BSN, RN, CWS, Kim on 05/28/2018 10:44:56 Rota, Traci [name] ([id number]) -------------------------------------------------------------------------------- SuperBill Details Patient Name: Traci [name] Date of Service: 05/28/2018 Medical Record Number: [id number] Patient Account Number: [id number] Date of Birth/Sex: 1929/11/28 (85 y.o. 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 F01.50 Vascular dementia without behavioral disturbance N18.9 Chronic kidney disease, unspecified Facility Procedures CPT4 Code: 54862824 Description: [phone] - WOUND CARE VISIT-LEV 5 EST PT Modifier: Quantity: 1 Physician Procedures CPT4 Code: [id number] Description: WC PHYS LEVEL 3 o NEW PT ICD-10 Diagnosis Description L89.620 Pressure ulcer of left heel, unstageable L89.610 Pressure ulcer of right heel, unstageable L89.892 Pressure ulcer of other site, stage 2 C49.4 Malignant neoplasm of connective and  soft tissue of a Modifier: bdomen Quantity: 1 Electronic Signature(s) Signed: 06/24/2018 1:10:56 AM By: Worthy Keeler PA-C Previous Signature: 05/28/2018 2:41:30 PM Version By: Montey Hora Entered By: Worthy Keeler on 05/28/2018 23:51:16

## 2018-06-25 ENCOUNTER — Telehealth: Payer: Self-pay | Admitting: *Deleted

## 2018-06-25 ENCOUNTER — Encounter: Payer: Self-pay | Admitting: Internal Medicine

## 2018-06-25 NOTE — Telephone Encounter (Signed)
Hospice nurse called reporting that patient expired at 8:25 AM this morning

## 2018-06-27 ENCOUNTER — Other Ambulatory Visit: Payer: Self-pay | Admitting: *Deleted

## 2018-06-27 NOTE — Progress Notes (Signed)
Death certificate received. Pending md signature

## 2018-07-08 DEATH — deceased

## 2018-10-04 NOTE — Progress Notes (Signed)
SADAE, FLEETWOOD ([id number]) Visit Report for 05/28/2018 Allergy List Details Patient Name: Traci [name], Traci [name] Date of Service: 05/28/2018 10:15 AM Medical Record Number: [id number] Patient Account Number: [id number] Date of Birth/Sex: December 27, 1929 (85 y.o. F) Treating RN: Cornell Barman Primary Care Shelley Cocke: Fulton Reek Other Clinician: Referring Franceen Erisman: Fulton Reek Treating Hadlyn Amero/Extender: Melburn Hake, HOYT Weeks in Treatment: 0 Allergies Active Allergies No Known Drug Allergies Allergy Notes Electronic Signature(s) Signed: 05/28/2018 3:23:54 PM By: Gretta Cool, BSN, RN, CWS, Kim RN, BSN Entered By: Gretta Cool, BSN, RN, CWS, Kim on 05/28/2018 10:41:09 Traci [name] ([id number]) -------------------------------------------------------------------------------- Arrival Information Details Patient Name: Traci [name] Date of Service: 05/28/2018 10:15 AM Medical Record Number: [id number] Patient Account Number: [id number] Date of Birth/Sex: [birth year] (85 y.o. F) Treating RN: Cornell Barman Primary Care Osceola Depaz: Fulton Reek Other Clinician: Referring Cassandra Harbold: Fulton Reek Treating Staley Lunz/Extender: Melburn Hake, HOYT Weeks in Treatment: 0 Visit Information Patient Arrived: Wheel Chair Arrival Time: 10:39 Accompanied By: husband, Zenia Resides Transfer Assistance: Manual Patient Identification Verified: Yes Secondary Verification Process Completed: Yes Patient Has Alerts: Yes Patient Alerts: NOT DIABETIC Electronic Signature(s) Signed: 05/28/2018 3:23:54 PM By: Gretta Cool, BSN, RN, CWS, Kim RN, BSN Entered By: Gretta Cool, BSN, RN, CWS, Kim on 05/28/2018 10:39:56 Traci [name] ([id number]) -------------------------------------------------------------------------------- Clinic Level of Care Assessment Details Patient Name: Traci [name] Date of Service: 05/28/2018 10:15 AM Medical Record Number: [id number] Patient Account Number: [id number] Date of Birth/Sex: October 25, 1929 (85 y.o. F) Treating  RN: Montey Hora Primary Care Leahmarie Gasiorowski: Fulton Reek Other Clinician: Referring Kemontae Dunklee: Fulton Reek Treating Crayton Savarese/Extender: Melburn Hake, HOYT Weeks in Treatment: 0 Clinic Level of Care Assessment Items TOOL 2 Quantity Score []  - Use when only an EandM is performed on the INITIAL visit 0 ASSESSMENTS - Nursing Assessment / Reassessment X - General Physical Exam (combine w/ comprehensive assessment (listed just below) when 1 20 performed on new pt. evals) X- 1 25 Comprehensive Assessment (HX, ROS, Risk Assessments, Wounds Hx, etc.) ASSESSMENTS - Wound and Skin Assessment / Reassessment []  - Simple Wound Assessment / Reassessment - one wound 0 X- 2 5 Complex Wound Assessment / Reassessment - multiple wounds []  - 0 Dermatologic / Skin Assessment (not related to wound area) ASSESSMENTS - Ostomy and/or Continence Assessment and Care []  - Incontinence Assessment and Management 0 []  - 0 Ostomy Care Assessment and Management (repouching, etc.) PROCESS - Coordination of Care X - Simple Patient / Family Education for ongoing care 1 15 []  - 0 Complex (extensive) Patient / Family Education for ongoing care X- 1 10 Staff obtains Programmer, systems, Records, Test Results / Process Orders []  - 0 Staff telephones HHA, Nursing Homes / Clarify orders / etc []  - 0 Routine Transfer to another Facility (non-emergent condition) []  - 0 Routine Hospital Admission (non-emergent condition) X- 1 15 New Admissions / Biomedical engineer / Ordering NPWT, Apligraf, etc. []  - 0 Emergency Hospital Admission (emergent condition) X- 1 10 Simple Discharge Coordination []  - 0 Complex (extensive) Discharge Coordination PROCESS - Special Needs []  - Pediatric / Minor Patient Management 0 []  - 0 Isolation Patient Management Traci [name], URENA. ([id number]) []  - 0 Hearing / Language / Visual special needs []  - 0 Assessment of Community assistance (transportation, D/C planning, etc.) []  - 0 Additional  assistance / Altered mentation []  - 0 Support Surface(s) Assessment (bed, cushion, seat, etc.) INTERVENTIONS - Wound Cleansing / Measurement X - Wound Imaging (photographs - any number of wounds) 1 5 []  - 0 Wound Tracing (instead of photographs) []  - 0 Simple Wound Measurement -  one wound X- 2 5 Complex Wound Measurement - multiple wounds []  - 0 Simple Wound Cleansing - one wound X- 2 5 Complex Wound Cleansing - multiple wounds INTERVENTIONS - Wound Dressings X - Small Wound Dressing one or multiple wounds 2 10 []  - 0 Medium Wound Dressing one or multiple wounds []  - 0 Large Wound Dressing one or multiple wounds []  - 0 Application of Medications - injection INTERVENTIONS - Miscellaneous []  - External ear exam 0 []  - 0 Specimen Collection (cultures, biopsies, blood, body fluids, etc.) []  - 0 Specimen(s) / Culture(s) sent or taken to Lab for analysis []  - 0 Patient Transfer (multiple staff / Civil Service fast streamer / Similar devices) []  - 0 Simple Staple / Suture removal (25 or less) []  - 0 Complex Staple / Suture removal (26 or more) []  - 0 Hypo / Hyperglycemic Management (close monitor of Blood Glucose) X- 1 15 Ankle / Brachial Index (ABI) - do not check if billed separately Has the patient been seen at the hospital within the last three years: Yes Total Score: 165 Level Of Care: New/Established - Level 5 Electronic Signature(s) Signed: 05/28/2018 2:55:40 PM By: Montey Hora Entered By: Montey Hora on 05/28/2018 14:41:18 Traci [name] ([id number]) -------------------------------------------------------------------------------- Encounter Discharge Information Details Patient Name: Traci [name] Date of Service: 05/28/2018 10:15 AM Medical Record Number: [id number] Patient Account Number: [id number] Date of Birth/Sex: 08/27/1929 (85 y.o. F) Treating RN: Montey Hora Primary Care Kelcey Wickstrom: Fulton Reek Other Clinician: Referring Taisei Bonnette: Fulton Reek Treating  Tekoa Amon/Extender: Melburn Hake, HOYT Weeks in Treatment: 0 Encounter Discharge Information Items Discharge Condition: Stable Ambulatory Status: Wheelchair Discharge Destination: Home Transportation: Private Auto Accompanied By: spouse Schedule Follow-up Appointment: Yes Clinical Summary of Care: Electronic Signature(s) Signed: 05/28/2018 2:46:07 PM By: Montey Hora Entered By: Montey Hora on 05/28/2018 14:46:07 Traci [name] ([id number]) -------------------------------------------------------------------------------- Lower Extremity Assessment Details Patient Name: Traci [name] Date of Service: 05/28/2018 10:15 AM Medical Record Number: [id number] Patient Account Number: [id number] Date of Birth/Sex: 1929-08-01 (85 y.o. F) Treating RN: Cornell Barman Primary Care Tniyah Nakagawa: Fulton Reek Other Clinician: Referring Aunna Snooks: Fulton Reek Treating Ericia Moxley/Extender: Melburn Hake, HOYT Weeks in Treatment: 0 Edema Assessment Assessed: [Left: No] [Right: No] Edema: [Left: No] [Right: No] Vascular Assessment Pulses: Dorsalis Pedis Doppler Audible: [Left:Yes] [Right:Yes] Posterior Tibial Doppler Audible: [Left:Yes] [Right:Yes] Blood Pressure: Brachial: [Left:130] Ankle: [Left:Dorsalis Pedis: 175 1.35] Notes Unable to get ABI on right due to faint pulses and patient discomfort. Electronic Signature(s) Signed: 05/28/2018 3:23:54 PM By: Gretta Cool, BSN, RN, CWS, Kim RN, BSN Entered By: Gretta Cool, BSN, RN, CWS, Kim on 05/28/2018 11:10:47 Traci [name] ([id number]) -------------------------------------------------------------------------------- Multi Wound Chart Details Patient Name: Traci [name] Date of Service: 05/28/2018 10:15 AM Medical Record Number: [id number] Patient Account Number: [id number] Date of Birth/Sex: 1929-10-24 (85 y.o. F) Treating RN: Montey Hora Primary Care Carmita Boom: Fulton Reek Other Clinician: Referring Dierra Riesgo: Fulton Reek Treating  Cimone Fahey/Extender: Melburn Hake, HOYT Weeks in Treatment: 0 Vital Signs Height(in): 62 Pulse(bpm): 13 Weight(lbs): 108 Blood Pressure(mmHg): 130/67 Body Mass Index(BMI): 20 Temperature(F): 97.8 Respiratory Rate 16 (breaths/min): Photos: Wound Location: Left Calcaneus Right Calcaneus Right Lower Leg - Posterior Wounding Event: Pressure Injury Pressure Injury Pressure Injury Primary Etiology: Pressure Ulcer Pressure Ulcer Pressure Ulcer Comorbid History: Dementia Dementia Dementia Date Acquired: 04/29/2018 05/14/2018 05/14/2018 Weeks of Treatment: 0 0 0 Wound Status: Open Open Open Measurements L x W x D 3.5x3.2x0.1 1x1.2x0.1 2x1x0.1 (cm) Area (cm) : 8.796 0.942 1.571 Volume (cm) : 0.88 0.094 0.157 % Reduction in Area: 0.00%  0.00% N/A % Reduction in Volume: 0.00% 0.00% N/A Classification: Unstageable/Unclassified Unstageable/Unclassified Category/Stage II Exudate Amount: None Present None Present Medium Exudate Type: N/A N/A Serous Exudate Color: N/A N/A amber Wound Margin: Flat and Intact Flat and Intact Indistinct, nonvisible Granulation Amount: None Present (0%) None Present (0%) Small (1-33%) Granulation Quality: N/A N/A Pink Necrotic Amount: Large (67-100%) Large (67-100%) Medium (34-66%) Necrotic Tissue: Traci [name] Exposed Structures: Fascia: No N/A Fat Layer (Subcutaneous Fat Layer (Subcutaneous Tissue) Exposed: Yes Tissue) Exposed: No Fascia: No Tendon: No Tendon: No Muscle: No Muscle: No Joint: No Joint: No Bone: No Bone: No Traci [name], Traci W. ([id number]) Epithelialization: None None None Treatment Notes Electronic Signature(s) Signed: 05/28/2018 2:55:40 PM By: Montey Hora Entered By: Montey Hora on 05/28/2018 11:21:35 Traci [name] ([id number]) -------------------------------------------------------------------------------- Multi-Disciplinary Care Plan Details Patient Name: Traci [name] Date of Service: 05/28/2018 10:15  AM Medical Record Number: [id number] Patient Account Number: [id number] Date of Birth/Sex: March 31, 1929 (85 y.o. F) Treating RN: Montey Hora Primary Care Rasheena Talmadge: Fulton Reek Other Clinician: Referring Javaris Wigington: Fulton Reek Treating Raia Amico/Extender: Melburn Hake, HOYT Weeks in Treatment: 0 Active Inactive Electronic Signature(s) Signed: 06/10/2018 1:51:58 PM By: Gretta Cool, BSN, RN, CWS, Kim RN, BSN Signed: 10/04/2018 1:04:07 PM By: Montey Hora Previous Signature: 05/28/2018 2:55:40 PM Version By: Montey Hora Entered By: Gretta Cool BSN, RN, CWS, Kim on 06/10/2018 13:51:58 Traci [name], Traci [name] ([id number]) -------------------------------------------------------------------------------- Pain Assessment Details Patient Name: Traci [name] Date of Service: 05/28/2018 10:15 AM Medical Record Number: [id number] Patient Account Number: [id number] Date of Birth/Sex: 07/08/1929 (85 y.o. F) Treating RN: Cornell Barman Primary Care Jarred Purtee: Fulton Reek Other Clinician: Referring Elian Gloster: Fulton Reek Treating Daundre Biel/Extender: Melburn Hake, HOYT Weeks in Treatment: 0 Active Problems Location of Pain Severity and Description of Pain Patient Has Paino Patient Unable to Respond Site Locations Pain Management and Medication Current Pain Management: Electronic Signature(s) Signed: 05/28/2018 3:23:54 PM By: Gretta Cool, BSN, RN, CWS, Kim RN, BSN Entered By: Gretta Cool, BSN, RN, CWS, Kim on 05/28/2018 10:40:04 Traci [name] ([id number]) -------------------------------------------------------------------------------- Patient/Caregiver Education Details Patient Name: Traci [name] Date of Service: 05/28/2018 10:15 AM Medical Record Number: [id number] Patient Account Number: [id number] Date of Birth/Gender: April 23, 1929 (85 y.o. F) Treating RN: Montey Hora Primary Care Physician: Fulton Reek Other Clinician: Referring Physician: Fulton Reek Treating Physician/Extender: Sharalyn Ink  in Treatment: 0 Education Assessment Education Provided To: Caregiver Education Topics Provided Wound/Skin Impairment: Handouts: Other: wound care as ordered Methods: Demonstration, Explain/Verbal Responses: State content correctly Electronic Signature(s) Signed: 05/28/2018 2:55:40 PM By: Montey Hora Entered By: Montey Hora on 05/28/2018 14:42:47 Traci [name], Traci [name] ([id number]) -------------------------------------------------------------------------------- Wound Assessment Details Patient Name: Traci [name] Date of Service: 05/28/2018 10:15 AM Medical Record Number: [id number] Patient Account Number: [id number] Date of Birth/Sex: Dec 07, 1929 (85 y.o. F) Treating RN: Cornell Barman Primary Care Curstin Schmale: Fulton Reek Other Clinician: Referring Katherine Tout: Fulton Reek Treating Omni Dunsworth/Extender: Melburn Hake, HOYT Weeks in Treatment: 0 Wound Status Wound Number: 1 Primary Etiology: Pressure Ulcer Wound Location: Left Calcaneus Wound Status: Open Wounding Event: Pressure Injury Comorbid History: Dementia Date Acquired: 04/29/2018 Weeks Of Treatment: 0 Clustered Wound: No Photos Wound Measurements Length: (cm) 3.5 % Redu Width: (cm) 3.2 % Redu Depth: (cm) 0.1 Epithe Area: (cm) 8.796 Volume: (cm) 0.88 ction in Area: 0% ction in Volume: 0% lialization: None Wound Description Classification: Unstageable/Unclassified Foul Wound Margin: Flat and Intact Sloug Exudate Amount: None Present Odor After Cleansing: No h/Fibrino No Wound Bed Granulation Amount: None Present (0%) Exposed Structure Necrotic Amount: Large (67-100%) Fascia Exposed:  No Necrotic Quality: Eschar Fat Layer (Subcutaneous Tissue) Exposed: No Tendon Exposed: No Muscle Exposed: No Joint Exposed: No Bone Exposed: No Periwound Skin Texture Texture Color No Abnormalities Noted: No No Abnormalities Noted: No Moisture Traci [name], REEDUS ([id number]) No Abnormalities Noted: No Electronic  Signature(s) Signed: 05/28/2018 3:23:54 PM By: Gretta Cool, BSN, RN, CWS, Kim RN, BSN Entered By: Gretta Cool, BSN, RN, CWS, Kim on 05/28/2018 10:57:20 Traci [name], Traci [name] ([id number]) -------------------------------------------------------------------------------- Wound Assessment Details Patient Name: Traci [name] Date of Service: 05/28/2018 10:15 AM Medical Record Number: [id number] Patient Account Number: [id number] Date of Birth/Sex: [birth year] (85 y.o. F) Treating RN: Cornell Barman Primary Care Livian Vanderbeck: Fulton Reek Other Clinician: Referring Awesome Jared: Fulton Reek Treating Rema Lievanos/Extender: Melburn Hake, HOYT Weeks in Treatment: 0 Wound Status Wound Number: 3 Primary Etiology: Pressure Ulcer Wound Location: Right Lower Leg - Posterior Wound Status: Open Wounding Event: Pressure Injury Comorbid History: Dementia Date Acquired: 05/14/2018 Weeks Of Treatment: 0 Clustered Wound: No Photos Wound Measurements Length: (cm) 2 % Reduction Width: (cm) 1 % Reduction Depth: (cm) 0.1 Epithelializ Area: (cm) 1.571 Tunneling: Volume: (cm) 0.157 Undermining in Area: in Volume: ation: None No : No Wound Description Classification: Category/Stage II Foul Odor Af Wound Margin: Indistinct, nonvisible Slough/Fibri Exudate Amount: Medium Exudate Type: Serous Exudate Color: amber ter Cleansing: No no Yes Wound Bed Granulation Amount: Small (1-33%) Exposed Structure Granulation Quality: Pink Fascia Exposed: No Necrotic Amount: Medium (34-66%) Fat Layer (Subcutaneous Tissue) Exposed: Yes Necrotic Quality: Adherent Slough Tendon Exposed: No Muscle Exposed: No Joint Exposed: No Bone Exposed: No Periwound Skin Texture Texture Color Traci [name], POPKO. ([id number]) No Abnormalities Noted: No No Abnormalities Noted: No Erythema: Yes Moisture No Abnormalities Noted: No Electronic Signature(s) Signed: 05/28/2018 3:23:54 PM By: Gretta Cool, BSN, RN, CWS, Kim RN, BSN Entered By: Gretta Cool, BSN, RN,  CWS, Kim on 05/28/2018 11:02:22 Traci [name] ([id number]) -------------------------------------------------------------------------------- Vitals Details Patient Name: Traci [name] Date of Service: 05/28/2018 10:15 AM Medical Record Number: [id number] Patient Account Number: [id number] Date of Birth/Sex: July 13, 1929 (85 y.o. F) Treating RN: Cornell Barman Primary Care Dayton Kenley: Fulton Reek Other Clinician: Referring Saturnino Liew: Fulton Reek Treating Adela Esteban/Extender: Melburn Hake, HOYT Weeks in Treatment: 0 Vital Signs Time Taken: 10:40 Temperature (F): 97.8 Height (in): 62 Pulse (bpm): 77 Weight (lbs): 108 Respiratory Rate (breaths/min): 16 Body Mass Index (BMI): 19.8 Blood Pressure (mmHg): 130/67 Reference Range: 80 - 120 mg / dl Electronic Signature(s) Signed: 05/28/2018 3:23:54 PM By: Gretta Cool, BSN, RN, CWS, Kim RN, BSN Entered By: Gretta Cool, BSN, RN, CWS, Kim on 05/28/2018 10:40:44
